# Patient Record
Sex: Female | Born: 1945 | Race: White | Hispanic: No | Smoking: Never smoker
Health system: Southern US, Community
[De-identification: ages and names within clinical notes are randomized; demographics above are authoritative.]

## PROBLEM LIST (undated history)

## (undated) DIAGNOSIS — C4490 Unspecified malignant neoplasm of skin, unspecified: Secondary | ICD-10-CM

## (undated) DIAGNOSIS — K219 Gastro-esophageal reflux disease without esophagitis: Secondary | ICD-10-CM

## (undated) DIAGNOSIS — T8859XA Other complications of anesthesia, initial encounter: Secondary | ICD-10-CM

## (undated) DIAGNOSIS — E7800 Pure hypercholesterolemia, unspecified: Secondary | ICD-10-CM

## (undated) DIAGNOSIS — Z8781 Personal history of (healed) traumatic fracture: Secondary | ICD-10-CM

## (undated) DIAGNOSIS — B019 Varicella without complication: Secondary | ICD-10-CM

## (undated) DIAGNOSIS — Z8719 Personal history of other diseases of the digestive system: Secondary | ICD-10-CM

## (undated) DIAGNOSIS — I10 Essential (primary) hypertension: Secondary | ICD-10-CM

## (undated) DIAGNOSIS — J189 Pneumonia, unspecified organism: Secondary | ICD-10-CM

## (undated) DIAGNOSIS — IMO0002 Reserved for concepts with insufficient information to code with codable children: Secondary | ICD-10-CM

## (undated) DIAGNOSIS — T4145XA Adverse effect of unspecified anesthetic, initial encounter: Secondary | ICD-10-CM

## (undated) DIAGNOSIS — K5730 Diverticulosis of large intestine without perforation or abscess without bleeding: Secondary | ICD-10-CM

## (undated) DIAGNOSIS — M810 Age-related osteoporosis without current pathological fracture: Secondary | ICD-10-CM

## (undated) DIAGNOSIS — M1990 Unspecified osteoarthritis, unspecified site: Secondary | ICD-10-CM

## (undated) DIAGNOSIS — K2270 Barrett's esophagus without dysplasia: Secondary | ICD-10-CM

## (undated) HISTORY — DX: Essential (primary) hypertension: I10

## (undated) HISTORY — DX: Gastro-esophageal reflux disease without esophagitis: K21.9

## (undated) HISTORY — DX: Diverticulosis of large intestine without perforation or abscess without bleeding: K57.30

## (undated) HISTORY — PX: SKIN CANCER EXCISION: SHX779

## (undated) HISTORY — DX: Unspecified malignant neoplasm of skin, unspecified: C44.90

## (undated) HISTORY — PX: ESOPHAGOGASTRODUODENOSCOPY: SHX1529

## (undated) HISTORY — PX: TUBAL LIGATION: SHX77

## (undated) HISTORY — DX: Barrett's esophagus without dysplasia: K22.70

## (undated) HISTORY — DX: Varicella without complication: B01.9

## (undated) HISTORY — DX: Pure hypercholesterolemia, unspecified: E78.00

## (undated) HISTORY — PX: JOINT REPLACEMENT: SHX530

## (undated) HISTORY — DX: Unspecified osteoarthritis, unspecified site: M19.90

## (undated) HISTORY — DX: Reserved for concepts with insufficient information to code with codable children: IMO0002

## (undated) HISTORY — PX: COLONOSCOPY: SHX174

---

## 1998-01-29 HISTORY — PX: BREAST SURGERY: SHX581

## 1998-01-29 HISTORY — PX: BREAST EXCISIONAL BIOPSY: SUR124

## 2004-04-25 ENCOUNTER — Ambulatory Visit: Payer: Self-pay

## 2004-05-03 ENCOUNTER — Ambulatory Visit: Payer: Self-pay | Admitting: Unknown Physician Specialty

## 2005-07-10 ENCOUNTER — Ambulatory Visit: Payer: Self-pay | Admitting: Internal Medicine

## 2006-02-03 LAB — HM COLONOSCOPY

## 2006-07-29 ENCOUNTER — Ambulatory Visit: Payer: Self-pay | Admitting: Internal Medicine

## 2007-09-25 ENCOUNTER — Ambulatory Visit: Payer: Self-pay | Admitting: Unknown Physician Specialty

## 2007-10-15 ENCOUNTER — Ambulatory Visit: Payer: Self-pay | Admitting: Internal Medicine

## 2009-01-26 ENCOUNTER — Ambulatory Visit: Payer: Self-pay | Admitting: Internal Medicine

## 2009-12-08 ENCOUNTER — Ambulatory Visit: Payer: Self-pay | Admitting: Unknown Physician Specialty

## 2009-12-09 LAB — PATHOLOGY REPORT

## 2010-03-01 ENCOUNTER — Ambulatory Visit: Payer: Self-pay | Admitting: Internal Medicine

## 2011-03-28 ENCOUNTER — Ambulatory Visit: Payer: Self-pay | Admitting: Unknown Physician Specialty

## 2011-10-05 ENCOUNTER — Ambulatory Visit: Payer: Self-pay | Admitting: Unknown Physician Specialty

## 2012-02-04 ENCOUNTER — Encounter: Payer: Self-pay | Admitting: Internal Medicine

## 2012-02-04 ENCOUNTER — Other Ambulatory Visit (HOSPITAL_COMMUNITY)
Admission: RE | Admit: 2012-02-04 | Discharge: 2012-02-04 | Disposition: A | Discharge status: Home / Self Care | Payer: Medicare PPO | Source: Ambulatory Visit | Attending: Internal Medicine | Admitting: Internal Medicine

## 2012-02-04 ENCOUNTER — Ambulatory Visit (INDEPENDENT_AMBULATORY_CARE_PROVIDER_SITE_OTHER): Payer: Medicare PPO | Admitting: Internal Medicine

## 2012-02-04 VITALS — BP 118/70 | HR 73 | Temp 98.3°F | Ht 63.5 in | Wt 202.0 lb

## 2012-02-04 DIAGNOSIS — Z139 Encounter for screening, unspecified: Secondary | ICD-10-CM

## 2012-02-04 DIAGNOSIS — IMO0002 Reserved for concepts with insufficient information to code with codable children: Secondary | ICD-10-CM

## 2012-02-04 DIAGNOSIS — Z124 Encounter for screening for malignant neoplasm of cervix: Secondary | ICD-10-CM | POA: Insufficient documentation

## 2012-02-04 DIAGNOSIS — E7800 Pure hypercholesterolemia, unspecified: Secondary | ICD-10-CM

## 2012-02-04 DIAGNOSIS — K219 Gastro-esophageal reflux disease without esophagitis: Secondary | ICD-10-CM

## 2012-02-04 DIAGNOSIS — R6889 Other general symptoms and signs: Secondary | ICD-10-CM

## 2012-02-04 DIAGNOSIS — B977 Papillomavirus as the cause of diseases classified elsewhere: Secondary | ICD-10-CM

## 2012-02-04 DIAGNOSIS — Z1151 Encounter for screening for human papillomavirus (HPV): Secondary | ICD-10-CM | POA: Insufficient documentation

## 2012-02-04 DIAGNOSIS — I10 Essential (primary) hypertension: Secondary | ICD-10-CM

## 2012-02-04 DIAGNOSIS — R87810 Cervical high risk human papillomavirus (HPV) DNA test positive: Secondary | ICD-10-CM | POA: Insufficient documentation

## 2012-02-04 DIAGNOSIS — K2270 Barrett's esophagus without dysplasia: Secondary | ICD-10-CM

## 2012-02-04 DIAGNOSIS — R5381 Other malaise: Secondary | ICD-10-CM

## 2012-02-04 DIAGNOSIS — Z8619 Personal history of other infectious and parasitic diseases: Secondary | ICD-10-CM | POA: Insufficient documentation

## 2012-02-04 DIAGNOSIS — R5383 Other fatigue: Secondary | ICD-10-CM

## 2012-02-04 DIAGNOSIS — C4490 Unspecified malignant neoplasm of skin, unspecified: Secondary | ICD-10-CM

## 2012-02-05 ENCOUNTER — Other Ambulatory Visit (INDEPENDENT_AMBULATORY_CARE_PROVIDER_SITE_OTHER): Payer: Medicare PPO

## 2012-02-05 DIAGNOSIS — R5383 Other fatigue: Secondary | ICD-10-CM

## 2012-02-05 DIAGNOSIS — R5381 Other malaise: Secondary | ICD-10-CM

## 2012-02-05 DIAGNOSIS — E7800 Pure hypercholesterolemia, unspecified: Secondary | ICD-10-CM

## 2012-02-05 DIAGNOSIS — K2270 Barrett's esophagus without dysplasia: Secondary | ICD-10-CM

## 2012-02-05 DIAGNOSIS — I10 Essential (primary) hypertension: Secondary | ICD-10-CM

## 2012-02-05 LAB — TSH: TSH: 3.39 u[IU]/mL (ref 0.35–5.50)

## 2012-02-05 LAB — BASIC METABOLIC PANEL
CO2: 24 mEq/L (ref 19–32)
Calcium: 9.2 mg/dL (ref 8.4–10.5)
Chloride: 107 mEq/L (ref 96–112)
Glucose, Bld: 103 mg/dL — ABNORMAL HIGH (ref 70–99)
Sodium: 138 mEq/L (ref 135–145)

## 2012-02-05 LAB — HEPATIC FUNCTION PANEL
ALT: 24 U/L (ref 0–35)
Albumin: 3.9 g/dL (ref 3.5–5.2)
Bilirubin, Direct: 0.1 mg/dL (ref 0.0–0.3)
Total Protein: 6.9 g/dL (ref 6.0–8.3)

## 2012-02-05 LAB — LDL CHOLESTEROL, DIRECT: Direct LDL: 182.4 mg/dL

## 2012-02-05 LAB — CBC WITH DIFFERENTIAL/PLATELET
Basophils Relative: 0.3 % (ref 0.0–3.0)
Eosinophils Relative: 2.3 % (ref 0.0–5.0)
HCT: 41.8 % (ref 36.0–46.0)
Hemoglobin: 14.4 g/dL (ref 12.0–15.0)
Lymphs Abs: 1.6 10*3/uL (ref 0.7–4.0)
MCV: 89.2 fl (ref 78.0–100.0)
Monocytes Absolute: 0.3 10*3/uL (ref 0.1–1.0)
Neutro Abs: 3 10*3/uL (ref 1.4–7.7)
RBC: 4.68 Mil/uL (ref 3.87–5.11)
WBC: 5 10*3/uL (ref 4.5–10.5)

## 2012-02-05 LAB — LIPID PANEL
HDL: 39.6 mg/dL (ref >39.00)
Total CHOL/HDL Ratio: 7

## 2012-02-06 ENCOUNTER — Telehealth: Payer: Self-pay | Admitting: Internal Medicine

## 2012-02-06 DIAGNOSIS — E7800 Pure hypercholesterolemia, unspecified: Secondary | ICD-10-CM

## 2012-02-06 MED ORDER — EZETIMIBE-SIMVASTATIN 10-20 MG PO TABS: 1.0000 | ORAL_TABLET | Freq: Every day | ORAL | Status: DC

## 2012-02-06 NOTE — Telephone Encounter (Signed)
Appointment 2/24 pt aware of appointment

## 2012-02-06 NOTE — Telephone Encounter (Signed)
Pt notified of labs via my chart messaging.  She was started on cholesterol medicine.  Needs a follow up lab in six weeks. This is non fasting.  Please call and schedule her an appt time to return for lab in 6 weeks.  Thanks.

## 2012-02-06 NOTE — Telephone Encounter (Signed)
Opened in error

## 2012-02-07 ENCOUNTER — Encounter: Payer: Self-pay | Admitting: Internal Medicine

## 2012-02-10 ENCOUNTER — Encounter: Payer: Self-pay | Admitting: Internal Medicine

## 2012-02-10 DIAGNOSIS — C4490 Unspecified malignant neoplasm of skin, unspecified: Secondary | ICD-10-CM | POA: Insufficient documentation

## 2012-02-10 NOTE — Assessment & Plan Note (Signed)
Blood pressure under good control.  Same medication regimen.  Check metabolic panel.    

## 2012-02-10 NOTE — Progress Notes (Signed)
  Subjective:    Patient ID: Kaitlin Dean, female    DOB: 12-Apr-1945, 67 y.o.   MRN: [id number]  HPI 67 year old female with past history of hypertension and hypercholesterolemia who comes in today to follow up on these issues as well as for a complete physical exam.  She states she is doing relatively well.  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  Has arthritis in her knees.  Saw Dr HBK. Xray and mri.  Has a torn meniscus.  Decreased cartilage.  Planning to see Dr Lequita Halt for another opinion.  Limiting her as far as her activities.  She stopped lipitor secondary to aching.  She previously tolerated vytorin.  Has been off cholesterol medicine now.    Past Medical History  Diagnosis Date  . Hypertension   . Hypercholesterolemia   . GERD (gastroesophageal reflux disease)   . 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 Skin cancer   . Barrett's esophagus   . Diverticulosis   . Chicken pox   . Arthritis   . Asthma     Current Outpatient Prescriptions on File Prior to Visit  Medication Sig Dispense Refill  . Calcium Carb-Cholecalciferol (CALCIUM 500 +D) 500-400 MG-UNIT TABS Take 1 tablet by mouth daily.       Marland Kitchen omeprazole (PRILOSEC) 20 MG capsule Take 20 mg by mouth daily.      Marland Kitchen telmisartan (MICARDIS) 40 MG tablet Take 40 mg by mouth daily.      Marland Kitchen ezetimibe-simvastatin (VYTORIN) 10-20 MG per tablet Take 1 tablet by mouth at bedtime.  30 tablet  3    Review of Systems Patient denies any headache, lightheadedness or dizziness.  No significant sinus or allergy symptoms.   No chest pain, tightness or palpitations.  No increased shortness of breath, cough or congestion.  No nausea or vomiting.  No abdominal pain or cramping.  No bowel change, such as diarrhea, constipation, BRBPR or melana.  No urine change.  Knee pain as outlined.      Objective:   Physical Exam Filed Vitals:   02/04/12 1401  BP: 118/70  Pulse: 73  Temp: 98.3 F (94.54 C)   67 year old  female in no acute distress.   HEENT:  Nares- clear.  Oropharynx - without lesions. NECK:  Supple.  Nontender.  No audible bruit.  HEART:  Appears to be regular. LUNGS:  No crackles or wheezing audible.  Respirations even and unlabored.  RADIAL PULSE:  Equal bilaterally.    BREASTS:  No nipple discharge or nipple retraction present.  Could not appreciate any distinct nodules or axillary adenopathy.  ABDOMEN:  Soft, nontender.  Bowel sounds present and normal.  No audible abdominal bruit.  GU:  Normal external genitalia.  Vaginal vault without lesions.  Cervix identified.  Pap performed. Could not appreciate any adnexal masses or tenderness.   RECTAL:  Heme negative.   EXTREMITIES:  No increased edema present.  DP pulses palpable and equal bilaterally.          Assessment & Plan:  MSK.  Knee pain - limiting her activity.  See above.  Planning to see Dr Lequita Halt.    INCREASED PSYCHOSOCIAL STRESSORS.  Doing well on Wellbutrin.  Follow.    HEALTH MAINTENANCE.  Physical today.  Pap today.  Schedule mammogram.  Colonoscopy 05/03/04 with diverticulosis and internal hemorrhoids.  Recommended follow up colonoscopy 04/2014.  IFOB - given today.

## 2012-02-10 NOTE — Assessment & Plan Note (Signed)
Last EGD 11/11.  Currently doing well.  Recommended follow up EGD 2014.

## 2012-02-10 NOTE — Assessment & Plan Note (Signed)
Previously evaluated at [facility].  Biopsy negative for dysplasia.  Repeat pap today.

## 2012-02-10 NOTE — Assessment & Plan Note (Signed)
Saw Dr Roseanne Kaufman.  Had basal cell removed.

## 2012-02-10 NOTE — Assessment & Plan Note (Signed)
Reflux symptoms controlled.  See above.

## 2012-02-10 NOTE — Assessment & Plan Note (Signed)
Off cholesterol medicine now.  Check lipid panel today.  Tolerated vytorin previously.  Had intolerance to Lipitor.

## 2012-03-01 ENCOUNTER — Encounter: Payer: Self-pay | Admitting: Internal Medicine

## 2012-03-24 ENCOUNTER — Other Ambulatory Visit (INDEPENDENT_AMBULATORY_CARE_PROVIDER_SITE_OTHER): Payer: Medicare PPO

## 2012-03-24 LAB — HEPATIC FUNCTION PANEL
Albumin: 4 g/dL (ref 3.5–5.2)
Alkaline Phosphatase: 78 U/L (ref 39–117)
Total Protein: 6.8 g/dL (ref 6.0–8.3)

## 2012-03-26 ENCOUNTER — Telehealth: Payer: Self-pay | Admitting: Internal Medicine

## 2012-03-26 NOTE — Telephone Encounter (Signed)
I received a form form Dr Lequita Halt for pre op clearance.  Please call and schedule pt for a pre op evaluation.  Will need 30 min.  Her surgery is planned for 04/30/12.  Thanks.

## 2012-03-27 NOTE — Telephone Encounter (Signed)
Appointment 04/07/12 @ 11 pt aware  Pt would like someone to call her with her lab results

## 2012-03-28 ENCOUNTER — Other Ambulatory Visit: Payer: Self-pay | Admitting: Orthopedic Surgery

## 2012-03-28 MED ORDER — DEXAMETHASONE SODIUM PHOSPHATE 10 MG/ML IJ SOLN: 10.0000 mg | Freq: Once | INTRAMUSCULAR | Status: DC

## 2012-03-28 MED ORDER — BUPIVACAINE LIPOSOME 1.3 % IJ SUSP: 20.0000 mL | Freq: Once | INTRAMUSCULAR | Status: DC

## 2012-03-28 NOTE — Progress Notes (Signed)
Preoperative surgical orders have been place into the Epic hospital system for [facility] on 03/28/2012, 2:07 PM  by Patrica Duel for surgery on 04/30/2012.  Preop Total Knee orders including Experal, IV Tylenol, and IV Decadron as long as there are no contraindications to the above medications. Avel Peace, PA-C

## 2012-03-28 NOTE — Telephone Encounter (Signed)
Patients does not have results at this moment.

## 2012-04-02 NOTE — Telephone Encounter (Signed)
Patient notified as instructed by telephone. 

## 2012-04-02 NOTE — Telephone Encounter (Signed)
Notify pt her liver panel is normal.  Also make sure pt is aware of pre op appt with me on 04/07/12

## 2012-04-02 NOTE — Telephone Encounter (Signed)
Spoke to patient and was advised that she is having problems with my chart and would like a call back with her lab results.

## 2012-04-07 ENCOUNTER — Encounter: Payer: Self-pay | Admitting: Internal Medicine

## 2012-04-07 ENCOUNTER — Ambulatory Visit (INDEPENDENT_AMBULATORY_CARE_PROVIDER_SITE_OTHER): Payer: Medicare PPO | Admitting: Internal Medicine

## 2012-04-07 VITALS — BP 120/80 | HR 74 | Temp 98.3°F | Ht 63.5 in | Wt 195.8 lb

## 2012-04-08 ENCOUNTER — Encounter: Payer: Self-pay | Admitting: Internal Medicine

## 2012-04-08 NOTE — Progress Notes (Signed)
Subjective:    Patient ID: Kaitlin Dean, female    DOB: Jun 05, 1945, 67 y.o.   MRN: [id number]  HPI 67 year old female with past history of hypertension and hypercholesterolemia who comes in today for a pre op evaluation.  She states she is doing well.  Planning to have knee surgery.  Needing a pre op evaluation.  She states she feels good.  Denies any chest pain or tightness.  No sob.  No nausea or vomiting.  No bowel change.  No increased acid reflux.  Trying to exercise more.  Adjusting her diet.    Past Medical History  Diagnosis Date  . Hypertension   . Hypercholesterolemia   . GERD (gastroesophageal reflux disease)   . 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 Skin cancer   . Barrett's esophagus   . Diverticulosis   . Chicken pox   . Arthritis   . Asthma     Current Outpatient Prescriptions on File Prior to Visit  Medication Sig Dispense Refill  . BuPROPion HCl (WELLBUTRIN SR PO) Take 100 mg by mouth daily.      . Calcium Carb-Cholecalciferol (CALCIUM 500 +D) 500-400 MG-UNIT TABS Take 1 tablet by mouth daily.       . cholecalciferol (VITAMIN D) 1000 UNITS tablet Take 1,000 Units by mouth daily.      Marland Kitchen ezetimibe-simvastatin (VYTORIN) 10-20 MG per tablet Take 1 tablet by mouth at bedtime.  30 tablet  3  . Multiple Vitamin (MULTIVITAMIN) tablet Take 1 tablet by mouth daily.      Marland Kitchen omeprazole (PRILOSEC) 20 MG capsule Take 20 mg by mouth daily.      Marland Kitchen telmisartan (MICARDIS) 40 MG tablet Take 40 mg by mouth daily.      . vitamin B-12 (CYANOCOBALAMIN) 100 MCG tablet Take 100 mcg by mouth daily.       No current facility-administered medications on file prior to visit.    Review of Systems Patient denies any headache, lightheadedness or dizziness.  No sinus or allergy symptoms.   No chest pain, tightness or palpitations.  No increased shortness of breath, cough or congestion.  No nausea or vomiting.  No acid reflux.  No abdominal pain or cramping.  No bowel change, such as  diarrhea, constipation, BRBPR or melana.  No urine change.  Planning for knee surgery as outlined.      Objective:   Physical Exam  Filed Vitals:   04/07/12 1058  BP: 120/80  Pulse: 74  Temp: 98.3 F (73.82 C)   67 year old female in no acute distress.   HEENT:  Nares- clear.  Oropharynx - without lesions. NECK:  Supple.  Nontender.  No audible bruit.  HEART:  Appears to be regular. LUNGS:  No crackles or wheezing audible.  Respirations even and unlabored.  RADIAL PULSE:  Equal bilaterally.  ABDOMEN:  Soft, nontender.  Bowel sounds present and normal.  No audible abdominal bruit.    EXTREMITIES:  No increased edema present.  DP pulses palpable and equal bilaterally.          Assessment & Plan:  MSK.  Knee pain.  Planning for knee surgery.  Seeing Dr Lequita Halt.    PRE OP EVALUATION.  EKG obtained and revealed SR with no acute ischemic changes.  She is currently asymptomatic.  I feel that she is at low risk from a cardiac standpoint to proceed with the planned knee surgery.  I do recommend close intra op and post op monitoring  of her heart rate and blood pressure to avoid extremes.    INCREASED PSYCHOSOCIAL STRESSORS.  Doing well on Wellbutrin.  Follow.    HEALTH MAINTENANCE.  Physical last visit. Colonoscopy 05/03/04 with diverticulosis and internal hemorrhoids.  Recommended follow up colonoscopy 04/2014.  Pap negative but HPV is positive.  Declines referral back to GYN.

## 2012-04-08 NOTE — Assessment & Plan Note (Signed)
Blood pressure under good control.  Same medication regimen.  I recommend close intra op and post op monitoring of her heart rate and blood pressure to avoid extremes.

## 2012-04-08 NOTE — Assessment & Plan Note (Signed)
Reflux symptoms controlled.  Would avoid antiinflammatories.   

## 2012-04-08 NOTE — Assessment & Plan Note (Signed)
Last EGD 11/11.  Currently doing well.  Recommended follow up EGD 2014.   

## 2012-04-15 ENCOUNTER — Encounter (HOSPITAL_COMMUNITY): Payer: Self-pay | Admitting: Pharmacy Technician

## 2012-04-21 NOTE — Patient Instructions (Signed)
Kaitlin Dean  04/21/2012   Your procedure is scheduled on: 04/30/12    Report to [facility] at    230pm  Call this number if you have problems the morning of surgery: [phone]   Remember:   Do not eat food after midnite.  May have clear liquids until 1030am then npo    Take these medicines the morning of surgery with A SIP OF WATER:    Do not wear jewelry, make-up or nail polish.  Do not wear lotions, powders, or perfumes.   Do not shave 48 hours prior to surgery.   Do not bring valuables to the hospital.  Contacts, dentures or bridgework may not be worn into surgery.  Leave suitcase in the car. After surgery it may be brought to your room.  For patients admitted to the hospital, checkout time is 11:00 AM the day of  discharge.   SEE CHG INSTRUCTION SHEET    Please read over the following fact sheets that you were given: MRSA Information, coughing and deep breathing exercises, leg exercises, Blood Transfusion Fact sheet, Incentive Spirometry Fact sheet                Failure to comply with these instructions may result in cancellation of your surgery.                Patient Signature ____________________________              Nurse Signature _____________________________

## 2012-04-22 ENCOUNTER — Encounter (HOSPITAL_COMMUNITY)
Admission: RE | Admit: 2012-04-22 | Discharge: 2012-04-22 | Disposition: A | Discharge status: Home / Self Care | Payer: Medicare PPO | Source: Ambulatory Visit | Attending: Orthopedic Surgery | Admitting: Orthopedic Surgery

## 2012-04-22 ENCOUNTER — Encounter (HOSPITAL_COMMUNITY): Payer: Self-pay

## 2012-04-22 ENCOUNTER — Ambulatory Visit (HOSPITAL_COMMUNITY)
Admission: RE | Admit: 2012-04-22 | Discharge: 2012-04-22 | Disposition: A | Discharge status: Home / Self Care | Payer: Medicare PPO | Source: Ambulatory Visit | Attending: Orthopedic Surgery | Admitting: Orthopedic Surgery

## 2012-04-22 DIAGNOSIS — Z01812 Encounter for preprocedural laboratory examination: Secondary | ICD-10-CM | POA: Insufficient documentation

## 2012-04-22 DIAGNOSIS — Z01818 Encounter for other preprocedural examination: Secondary | ICD-10-CM | POA: Insufficient documentation

## 2012-04-22 DIAGNOSIS — M47814 Spondylosis without myelopathy or radiculopathy, thoracic region: Secondary | ICD-10-CM | POA: Insufficient documentation

## 2012-04-22 DIAGNOSIS — J984 Other disorders of lung: Secondary | ICD-10-CM | POA: Insufficient documentation

## 2012-04-22 HISTORY — DX: Adverse effect of unspecified anesthetic, initial encounter: T41.45XA

## 2012-04-22 HISTORY — DX: Other complications of anesthesia, initial encounter: T88.59XA

## 2012-04-22 LAB — SURGICAL PCR SCREEN: Staphylococcus aureus: NEGATIVE

## 2012-04-22 LAB — COMPREHENSIVE METABOLIC PANEL
ALT: 30 U/L (ref 0–35)
AST: 26 U/L (ref 0–37)
Alkaline Phosphatase: 105 U/L (ref 39–117)
CO2: 25 mEq/L (ref 19–32)
Chloride: 103 mEq/L (ref 96–112)
Creatinine, Ser: 0.8 mg/dL (ref 0.50–1.10)
GFR calc non Af Amer: 74 mL/min — ABNORMAL LOW (ref >90)
Potassium: 3.8 mEq/L (ref 3.5–5.1)
Sodium: 137 mEq/L (ref 135–145)
Total Bilirubin: 0.5 mg/dL (ref 0.3–1.2)

## 2012-04-22 LAB — CBC
MCV: 91.8 fL (ref 78.0–100.0)
Platelets: 251 10*3/uL (ref 150–400)
RBC: 4.74 MIL/uL (ref 3.87–5.11)
WBC: 8.6 10*3/uL (ref 4.0–10.5)

## 2012-04-22 LAB — URINALYSIS, ROUTINE W REFLEX MICROSCOPIC
Bilirubin Urine: NEGATIVE
Glucose, UA: NEGATIVE mg/dL
Ketones, ur: NEGATIVE mg/dL
Specific Gravity, Urine: 1.011 (ref 1.005–1.030)
pH: 5.5 (ref 5.0–8.0)

## 2012-04-22 LAB — APTT: aPTT: 30 seconds (ref 24–37)

## 2012-04-22 LAB — URINE MICROSCOPIC-ADD ON

## 2012-04-22 NOTE — Progress Notes (Signed)
Urinalysis with micro results faxed via EPIC to Dr Aluisio.   

## 2012-04-27 NOTE — H&P (Signed)
TOTAL KNEE ADMISSION H&P  Patient is being admitted for left total knee arthroplasty.  Subjective:  Chief Complaint:left knee pain.  HPI: Kaitlin Dean, 67 y.o. female, has a history of pain and functional disability in the left knee due to arthritis and has failed non-surgical conservative treatments for greater than 12 weeks to includeNSAID's and/or analgesics, flexibility and strengthening excercises and activity modification.  Onset of symptoms was abrupt, starting 5 years ago with gradually worsening course since that time. The patient noted no past surgery on the left knee(s).  Patient currently rates pain in the left knee(s) at 7 out of 10 with activity. Patient has night pain, worsening of pain with activity and weight bearing, pain that interferes with activities of daily living, pain with passive range of motion, crepitus and joint swelling.  Patient has evidence of subchondral sclerosis, periarticular osteophytes and joint space narrowing by imaging studies. There is no active infection.  Patient Active Problem List   Diagnosis Date Noted  . Skin cancer 02/10/2012  . Hypertension 02/04/2012  . Hypercholesterolemia 02/04/2012  . GERD (gastroesophageal reflux disease) 02/04/2012  . HPV test positive 02/04/2012  . Barrett's esophagus 02/04/2012   Past Medical History  Diagnosis Date  . Hypertension   . Hypercholesterolemia   . GERD (gastroesophageal reflux disease)   . 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 Skin cancer   . Barrett's esophagus   . Chicken pox   . Arthritis   . Complication of anesthesia     woke up 15 years ago and felt like could not breathe - no complications     Past Surgical History  Procedure Laterality Date  . Tubal ligation    . Breast surgery  2000    Biopsy     Current outpatient prescriptions: buPROPion (WELLBUTRIN SR) 100 MG 12 hr tablet, Take 100 mg by mouth every morning., Disp: ,  Calcium Carb-Cholecalciferol (CALCIUM 500 +D)  500-400 MG-UNIT TABS, Take 1 tablet by mouth daily. , Disp: , Rfl: ;   cholecalciferol (VITAMIN D) 1000 UNITS tablet, Take 1,000 Units by mouth daily., Disp: , Rfl: ;  ezetimibe-simvastatin (VYTORIN) 10-20 MG per tablet, Take 1 tablet by mouth at bedtime., Disp: , Rfl:  Multiple Vitamin (MULTIVITAMIN) tablet, Take 1 tablet by mouth daily., Disp: , Rfl: ;   naproxen sodium (ANAPROX) 220 MG tablet, Take 440 mg by mouth 2 (two) times daily as needed (pain)., Disp: , Rfl: ;   omeprazole (PRILOSEC) 20 MG capsule, Take 20 mg by mouth daily., Disp: , Rfl: ;   telmisartan (MICARDIS) 40 MG tablet, Take 40 mg by mouth every morning. , Disp: , Rfl:  vitamin B-12 (CYANOCOBALAMIN) 100 MCG tablet, Take 100 mcg by mouth daily., Disp: , Rfl: ;  vitamin E 1000 UNIT capsule, Take 1,000 Units by mouth daily., Disp: , Rfl:   No Known Allergies  History  Substance Use Topics  . Smoking status: Never Smoker   . Smokeless tobacco: Never Used  . Alcohol Use: Yes     Comment: occasional    Family History  Problem Relation Age of Onset  . Hypertension Mother   . Congestive Heart Failure Mother   . Stroke Father   . Stroke Brother   . Heart failure Brother   . Sarcoidosis Sister     died of kidney failure  . Breast cancer Neg Hx   . Colon cancer Neg Hx      Review of Systems  Constitutional: Negative.   HENT:  Negative.  Negative for neck pain.   Eyes: Negative.   Respiratory: Negative.   Cardiovascular: Negative.   Gastrointestinal: Negative.   Genitourinary: Negative.   Musculoskeletal: Positive for joint pain. Negative for myalgias, back pain and falls.       Left knee pain  Skin: Negative.   Neurological: Negative.   Endo/Heme/Allergies: Negative.   Psychiatric/Behavioral: Negative.     Objective:  Physical Exam  Constitutional: She is oriented to person, place, and time. She appears well-developed and well-nourished. No distress.  HENT:  Head: Normocephalic and atraumatic.  Right Ear:  External ear normal.  Left Ear: External ear normal.  Nose: Nose normal.  Mouth/Throat: Oropharynx is clear and moist.  Eyes: Conjunctivae and EOM are normal.  Neck: Normal range of motion. Neck supple. No tracheal deviation present. No thyromegaly present.  Cardiovascular: Normal rate, regular rhythm, normal heart sounds and intact distal pulses.   No murmur heard. Respiratory: Effort normal and breath sounds normal. No respiratory distress. She has no wheezes. She exhibits no tenderness.  GI: Soft. Bowel sounds are normal. She exhibits no distension and no mass. There is no tenderness.  Musculoskeletal:       Right hip: Normal.       Left hip: Normal.       Right knee: Normal.       Left knee: She exhibits decreased range of motion and abnormal alignment. She exhibits no effusion and no erythema. Tenderness found. Medial joint line and lateral joint line tenderness noted.       Right lower leg: She exhibits no tenderness and no swelling.       Left lower leg: She exhibits no tenderness and no swelling.  Evaluation of her hips show normal range of motion, no discomfort. The right knee no effusion. Range 0 to 135 with no tenderness or instability. The left knee significant varus, range 5 to 120, marked crepitus on range of motion. Tenderness medial greater than lateral with no instability noted. Significant antalgic gait pattern on the left.  Lymphadenopathy:    She has no cervical adenopathy.  Neurological: She is alert and oriented to person, place, and time. She has normal strength and normal reflexes. No sensory deficit.  Skin: No rash noted. She is not diaphoretic. No erythema.  Psychiatric: She has a normal mood and affect. Her behavior is normal.   Vitals Pulse: 66 (Regular) Resp.: 18 (Unlabored) BP: 118/72 (Sitting, Left Arm, Standard)  Estimated body mass index is 35.22 kg/(m^2) as calculated from the following:   Height as of 02/04/12: 5' 3.5" (1.613 m).   Weight as of  02/04/12: 91.627 kg (202 lb).   Imaging Review Plain radiographs demonstrate severe degenerative joint disease of the left knee(s). The overall alignment issignificant varus. The bone quality appears to be fair for age and reported activity level.  Assessment/Plan:  End stage arthritis, left knee   The patient history, physical examination, clinical judgment of the provider and imaging studies are consistent with end stage degenerative joint disease of the left knee(s) and total knee arthroplasty is deemed medically necessary. The treatment options including medical management, injection therapy arthroscopy and arthroplasty were discussed at length. The risks and benefits of total knee arthroplasty were presented and reviewed. The risks due to aseptic loosening, infection, stiffness, patella tracking problems, thromboembolic complications and other imponderables were discussed. The patient acknowledged the explanation, agreed to proceed with the plan and consent was signed. Patient is being admitted for inpatient treatment for surgery, pain  control, PT, OT, prophylactic antibiotics, VTE prophylaxis, progressive ambulation and ADL's and discharge planning. The patient is planning to be discharged to skilled nursing facility ([facility])     [place], [place]

## 2012-04-30 ENCOUNTER — Inpatient Hospital Stay (HOSPITAL_COMMUNITY): Payer: Medicare PPO | Admitting: Registered Nurse

## 2012-04-30 ENCOUNTER — Encounter (HOSPITAL_COMMUNITY): Payer: Self-pay | Admitting: Registered Nurse

## 2012-04-30 ENCOUNTER — Encounter (HOSPITAL_COMMUNITY): Payer: Self-pay | Admitting: *Deleted

## 2012-04-30 ENCOUNTER — Inpatient Hospital Stay (HOSPITAL_COMMUNITY)
Admission: RE | Admit: 2012-04-30 | Discharge: 2012-05-03 | DRG: 470 | Disposition: A | Discharge status: Skilled Nursing Facility | Payer: Medicare PPO | Source: Ambulatory Visit | Attending: Orthopedic Surgery | Admitting: Orthopedic Surgery

## 2012-04-30 ENCOUNTER — Encounter (HOSPITAL_COMMUNITY)
Admission: RE | Disposition: A | Discharge status: Skilled Nursing Facility | Payer: Self-pay | Source: Ambulatory Visit | Attending: Orthopedic Surgery

## 2012-04-30 DIAGNOSIS — M1710 Unilateral primary osteoarthritis, unspecified knee: Principal | ICD-10-CM | POA: Diagnosis present

## 2012-04-30 DIAGNOSIS — I10 Essential (primary) hypertension: Secondary | ICD-10-CM | POA: Diagnosis present

## 2012-04-30 DIAGNOSIS — R42 Dizziness and giddiness: Secondary | ICD-10-CM | POA: Diagnosis not present

## 2012-04-30 DIAGNOSIS — M179 Osteoarthritis of knee, unspecified: Secondary | ICD-10-CM

## 2012-04-30 DIAGNOSIS — R110 Nausea: Secondary | ICD-10-CM | POA: Diagnosis not present

## 2012-04-30 DIAGNOSIS — E7800 Pure hypercholesterolemia, unspecified: Secondary | ICD-10-CM | POA: Diagnosis present

## 2012-04-30 DIAGNOSIS — K219 Gastro-esophageal reflux disease without esophagitis: Secondary | ICD-10-CM | POA: Diagnosis present

## 2012-04-30 DIAGNOSIS — Z96652 Presence of left artificial knee joint: Secondary | ICD-10-CM

## 2012-04-30 HISTORY — PX: TOTAL KNEE ARTHROPLASTY: SHX125

## 2012-04-30 LAB — TYPE AND SCREEN: Antibody Screen: NEGATIVE

## 2012-04-30 LAB — ABO/RH: ABO/RH(D): A NEG

## 2012-04-30 SURGERY — ARTHROPLASTY, KNEE, TOTAL
Anesthesia: Spinal | Site: Knee | Laterality: Left | Wound class: Clean

## 2012-04-30 MED ORDER — ONDANSETRON HCL 4 MG PO TABS: 4.0000 mg | ORAL_TABLET | Freq: Four times a day (QID) | ORAL | Status: DC | PRN

## 2012-04-30 MED ORDER — DIPHENHYDRAMINE HCL 12.5 MG/5ML PO ELIX
12.5000 mg | ORAL_SOLUTION | ORAL | Status: DC | PRN
  Administered 2012-05-02: 25 mg via ORAL
  Filled 2012-04-30: qty 10

## 2012-04-30 MED ORDER — MENTHOL 3 MG MT LOZG
1.0000 | LOZENGE | OROMUCOSAL | Status: DC | PRN
  Filled 2012-04-30: qty 9

## 2012-04-30 MED ORDER — FLEET ENEMA 7-19 GM/118ML RE ENEM: 1.0000 | ENEMA | Freq: Once | RECTAL | Status: AC | PRN

## 2012-04-30 MED ORDER — CHLORHEXIDINE GLUCONATE 4 % EX LIQD
60.0000 mL | Freq: Once | CUTANEOUS | Status: DC
  Filled 2012-04-30: qty 60

## 2012-04-30 MED ORDER — DEXAMETHASONE 6 MG PO TABS
10.0000 mg | ORAL_TABLET | Freq: Once | ORAL | Status: AC
  Administered 2012-05-01: 10 mg via ORAL
  Filled 2012-04-30: qty 1

## 2012-04-30 MED ORDER — TRAMADOL HCL 50 MG PO TABS
50.0000 mg | ORAL_TABLET | Freq: Four times a day (QID) | ORAL | Status: DC | PRN
  Administered 2012-05-02 (×2): 100 mg via ORAL
  Filled 2012-04-30 (×2): qty 2

## 2012-04-30 MED ORDER — FENTANYL CITRATE 0.05 MG/ML IJ SOLN
INTRAMUSCULAR | Status: DC | PRN
  Administered 2012-04-30 (×2): 50 ug via INTRAVENOUS

## 2012-04-30 MED ORDER — CEFAZOLIN SODIUM-DEXTROSE 2-3 GM-% IV SOLR
2.0000 g | INTRAVENOUS | Status: AC
  Administered 2012-04-30: 2 g via INTRAVENOUS

## 2012-04-30 MED ORDER — BISACODYL 10 MG RE SUPP: 10.0000 mg | Freq: Every day | RECTAL | Status: DC | PRN

## 2012-04-30 MED ORDER — SODIUM CHLORIDE 0.9 % IJ SOLN
INTRAMUSCULAR | Status: DC | PRN
  Administered 2012-04-30: 14:00:00

## 2012-04-30 MED ORDER — METHOCARBAMOL 500 MG PO TABS
500.0000 mg | ORAL_TABLET | Freq: Four times a day (QID) | ORAL | Status: DC | PRN
  Administered 2012-05-01 (×2): 500 mg via ORAL
  Filled 2012-04-30 (×3): qty 1

## 2012-04-30 MED ORDER — EZETIMIBE-SIMVASTATIN 10-20 MG PO TABS
1.0000 | ORAL_TABLET | Freq: Every day | ORAL | Status: DC
  Administered 2012-04-30 – 2012-05-02 (×3): 1 via ORAL
  Filled 2012-04-30 (×4): qty 1

## 2012-04-30 MED ORDER — DEXAMETHASONE SODIUM PHOSPHATE 10 MG/ML IJ SOLN: 10.0000 mg | Freq: Once | INTRAMUSCULAR | Status: AC

## 2012-04-30 MED ORDER — BUPIVACAINE LIPOSOME 1.3 % IJ SUSP
20.0000 mL | Freq: Once | INTRAMUSCULAR | Status: DC
  Filled 2012-04-30: qty 20

## 2012-04-30 MED ORDER — MEPERIDINE HCL 50 MG/ML IJ SOLN
6.2500 mg | INTRAMUSCULAR | Status: DC | PRN
  Administered 2012-04-30: 6.25 mg via INTRAVENOUS

## 2012-04-30 MED ORDER — METOCLOPRAMIDE HCL 5 MG/ML IJ SOLN: 5.0000 mg | Freq: Three times a day (TID) | INTRAMUSCULAR | Status: DC | PRN

## 2012-04-30 MED ORDER — LACTATED RINGERS IV SOLN
INTRAVENOUS | Status: DC
  Administered 2012-04-30 (×2): via INTRAVENOUS

## 2012-04-30 MED ORDER — ACETAMINOPHEN 325 MG PO TABS
650.0000 mg | ORAL_TABLET | Freq: Four times a day (QID) | ORAL | Status: DC | PRN
  Administered 2012-05-03: 650 mg via ORAL
  Filled 2012-04-30: qty 2

## 2012-04-30 MED ORDER — HYDROMORPHONE HCL PF 1 MG/ML IJ SOLN
INTRAMUSCULAR | Status: AC
  Filled 2012-04-30: qty 1

## 2012-04-30 MED ORDER — MORPHINE SULFATE 2 MG/ML IJ SOLN
1.0000 mg | INTRAMUSCULAR | Status: DC | PRN
  Administered 2012-04-30 – 2012-05-01 (×3): 2 mg via INTRAVENOUS
  Filled 2012-04-30 (×3): qty 1

## 2012-04-30 MED ORDER — STERILE WATER FOR IRRIGATION IR SOLN
Status: DC | PRN
  Administered 2012-04-30: 3000 mL

## 2012-04-30 MED ORDER — MIDAZOLAM HCL 5 MG/5ML IJ SOLN
INTRAMUSCULAR | Status: DC | PRN
  Administered 2012-04-30: 2 mg via INTRAVENOUS

## 2012-04-30 MED ORDER — RIVAROXABAN 10 MG PO TABS
10.0000 mg | ORAL_TABLET | Freq: Every day | ORAL | Status: DC
  Administered 2012-05-01 – 2012-05-03 (×3): 10 mg via ORAL
  Filled 2012-04-30 (×4): qty 1

## 2012-04-30 MED ORDER — BUPROPION HCL ER (SR) 100 MG PO TB12
100.0000 mg | ORAL_TABLET | Freq: Every morning | ORAL | Status: DC
  Administered 2012-05-01 – 2012-05-03 (×3): 100 mg via ORAL
  Filled 2012-04-30 (×3): qty 1

## 2012-04-30 MED ORDER — IRBESARTAN 150 MG PO TABS
150.0000 mg | ORAL_TABLET | Freq: Every day | ORAL | Status: DC
  Administered 2012-05-02: 150 mg via ORAL
  Filled 2012-04-30 (×3): qty 1

## 2012-04-30 MED ORDER — 0.9 % SODIUM CHLORIDE (POUR BTL) OPTIME
TOPICAL | Status: DC | PRN
  Administered 2012-04-30: 1000 mL

## 2012-04-30 MED ORDER — KCL IN DEXTROSE-NACL 20-5-0.9 MEQ/L-%-% IV SOLN
INTRAVENOUS | Status: DC
  Administered 2012-04-30 – 2012-05-01 (×3): via INTRAVENOUS
  Filled 2012-04-30 (×4): qty 1000

## 2012-04-30 MED ORDER — MEPERIDINE HCL 50 MG/ML IJ SOLN
INTRAMUSCULAR | Status: AC
  Filled 2012-04-30: qty 1

## 2012-04-30 MED ORDER — METHOCARBAMOL 500 MG PO TABS
ORAL_TABLET | ORAL | Status: AC
  Administered 2012-04-30: 500 mg via ORAL
  Filled 2012-04-30: qty 1

## 2012-04-30 MED ORDER — ACETAMINOPHEN 10 MG/ML IV SOLN
INTRAVENOUS | Status: DC | PRN
  Administered 2012-04-30: 1000 mg via INTRAVENOUS

## 2012-04-30 MED ORDER — METOCLOPRAMIDE HCL 10 MG PO TABS: 5.0000 mg | ORAL_TABLET | Freq: Three times a day (TID) | ORAL | Status: DC | PRN

## 2012-04-30 MED ORDER — PHENYLEPHRINE HCL 10 MG/ML IJ SOLN
INTRAMUSCULAR | Status: DC | PRN
Start: 2012-04-30 — End: 2012-04-30
  Administered 2012-04-30: 120 ug via INTRAVENOUS
  Administered 2012-04-30 (×2): 80 ug via INTRAVENOUS

## 2012-04-30 MED ORDER — PROPOFOL 10 MG/ML IV EMUL
INTRAVENOUS | Status: DC | PRN
  Administered 2012-04-30: 75 ug/kg/min via INTRAVENOUS

## 2012-04-30 MED ORDER — OXYCODONE HCL 5 MG PO TABS
5.0000 mg | ORAL_TABLET | ORAL | Status: DC | PRN
Start: 2012-04-30 — End: 2012-05-03
  Administered 2012-04-30 – 2012-05-01 (×6): 10 mg via ORAL
  Administered 2012-05-02 – 2012-05-03 (×4): 5 mg via ORAL
  Filled 2012-04-30: qty 1
  Filled 2012-04-30 (×2): qty 2
  Filled 2012-04-30: qty 1
  Filled 2012-04-30 (×2): qty 2
  Filled 2012-04-30 (×2): qty 1
  Filled 2012-04-30 (×2): qty 2

## 2012-04-30 MED ORDER — ACETAMINOPHEN 10 MG/ML IV SOLN
1000.0000 mg | Freq: Four times a day (QID) | INTRAVENOUS | Status: AC
  Administered 2012-04-30 – 2012-05-01 (×4): 1000 mg via INTRAVENOUS
  Filled 2012-04-30 (×7): qty 100

## 2012-04-30 MED ORDER — METHOCARBAMOL 100 MG/ML IJ SOLN: 500.0000 mg | Freq: Four times a day (QID) | INTRAVENOUS | Status: DC | PRN

## 2012-04-30 MED ORDER — ACETAMINOPHEN 10 MG/ML IV SOLN: 1000.0000 mg | Freq: Once | INTRAVENOUS | Status: DC

## 2012-04-30 MED ORDER — SODIUM CHLORIDE 0.9 % IV SOLN: INTRAVENOUS | Status: DC

## 2012-04-30 MED ORDER — HYDROMORPHONE HCL PF 1 MG/ML IJ SOLN
0.2500 mg | INTRAMUSCULAR | Status: DC | PRN
  Administered 2012-04-30 (×4): 0.5 mg via INTRAVENOUS

## 2012-04-30 MED ORDER — POLYETHYLENE GLYCOL 3350 17 G PO PACK: 17.0000 g | PACK | Freq: Every day | ORAL | Status: DC | PRN

## 2012-04-30 MED ORDER — PANTOPRAZOLE SODIUM 40 MG PO TBEC
40.0000 mg | DELAYED_RELEASE_TABLET | Freq: Every day | ORAL | Status: DC
  Filled 2012-04-30: qty 1

## 2012-04-30 MED ORDER — CEFAZOLIN SODIUM 1-5 GM-% IV SOLN
1.0000 g | Freq: Four times a day (QID) | INTRAVENOUS | Status: AC
  Administered 2012-04-30 – 2012-05-01 (×2): 1 g via INTRAVENOUS
  Filled 2012-04-30 (×2): qty 50

## 2012-04-30 MED ORDER — DOCUSATE SODIUM 100 MG PO CAPS
100.0000 mg | ORAL_CAPSULE | Freq: Two times a day (BID) | ORAL | Status: DC
  Administered 2012-04-30 – 2012-05-03 (×6): 100 mg via ORAL

## 2012-04-30 MED ORDER — LACTATED RINGERS IV SOLN
INTRAVENOUS | Status: DC
  Administered 2012-04-30: 16:00:00 via INTRAVENOUS

## 2012-04-30 MED ORDER — ACETAMINOPHEN 650 MG RE SUPP: 650.0000 mg | Freq: Four times a day (QID) | RECTAL | Status: DC | PRN

## 2012-04-30 MED ORDER — PHENOL 1.4 % MT LIQD
1.0000 | OROMUCOSAL | Status: DC | PRN
  Filled 2012-04-30: qty 177

## 2012-04-30 MED ORDER — PHENYLEPHRINE HCL 10 MG/ML IJ SOLN
10.0000 mg | INTRAVENOUS | Status: DC | PRN
  Administered 2012-04-30: 10 ug/min via INTRAVENOUS

## 2012-04-30 MED ORDER — ONDANSETRON HCL 4 MG/2ML IJ SOLN
4.0000 mg | Freq: Four times a day (QID) | INTRAMUSCULAR | Status: DC | PRN
  Administered 2012-04-30 – 2012-05-01 (×3): 4 mg via INTRAVENOUS
  Filled 2012-04-30 (×3): qty 2

## 2012-04-30 SURGICAL SUPPLY — 51 items
BAG ZIPLOCK 12X15 (MISCELLANEOUS) ×2 IMPLANT
BANDAGE ELASTIC 6 VELCRO ST LF (GAUZE/BANDAGES/DRESSINGS) ×2 IMPLANT
BANDAGE ESMARK 6X9 LF (GAUZE/BANDAGES/DRESSINGS) ×1 IMPLANT
BLADE SAG 18X100X1.27 (BLADE) ×2 IMPLANT
BLADE SAW SGTL 11.0X1.19X90.0M (BLADE) ×2 IMPLANT
BNDG ESMARK 6X9 LF (GAUZE/BANDAGES/DRESSINGS) ×2
BOWL SMART MIX CTS (DISPOSABLE) ×2 IMPLANT
CEMENT HV SMART SET (Cement) ×4 IMPLANT
CLOTH BEACON ORANGE TIMEOUT ST (SAFETY) ×2 IMPLANT
CUFF TOURN SGL QUICK 34 (TOURNIQUET CUFF) ×1
CUFF TRNQT CYL 34X4X40X1 (TOURNIQUET CUFF) ×1 IMPLANT
DRAPE EXTREMITY T 121X128X90 (DRAPE) ×2 IMPLANT
DRAPE POUCH INSTRU U-SHP 10X18 (DRAPES) ×2 IMPLANT
DRAPE U-SHAPE 47X51 STRL (DRAPES) ×2 IMPLANT
DRSG ADAPTIC 3X8 NADH LF (GAUZE/BANDAGES/DRESSINGS) ×2 IMPLANT
DURAPREP 26ML APPLICATOR (WOUND CARE) ×2 IMPLANT
ELECT REM PT RETURN 9FT ADLT (ELECTROSURGICAL) ×2
ELECTRODE REM PT RTRN 9FT ADLT (ELECTROSURGICAL) ×1 IMPLANT
EVACUATOR 1/8 PVC DRAIN (DRAIN) ×2 IMPLANT
FACESHIELD LNG OPTICON STERILE (SAFETY) ×10 IMPLANT
GLOVE BIO SURGEON STRL SZ7.5 (GLOVE) ×2 IMPLANT
GLOVE BIO SURGEON STRL SZ8 (GLOVE) ×2 IMPLANT
GLOVE BIOGEL PI IND STRL 8 (GLOVE) ×2 IMPLANT
GLOVE BIOGEL PI INDICATOR 8 (GLOVE) ×2
GLOVE SURG SS PI 6.5 STRL IVOR (GLOVE) ×4 IMPLANT
GOWN STRL NON-REIN LRG LVL3 (GOWN DISPOSABLE) ×4 IMPLANT
GOWN STRL REIN XL XLG (GOWN DISPOSABLE) ×2 IMPLANT
HANDPIECE INTERPULSE COAX TIP (DISPOSABLE) ×1
IMMOBILIZER KNEE 20 (SOFTGOODS) ×2
IMMOBILIZER KNEE 20 THIGH 36 (SOFTGOODS) ×1 IMPLANT
KIT BASIN OR (CUSTOM PROCEDURE TRAY) ×2 IMPLANT
MANIFOLD NEPTUNE II (INSTRUMENTS) ×2 IMPLANT
NEEDLE SPNL 20GX3.5 QUINCKE YW (NEEDLE) ×2 IMPLANT
NS IRRIG 1000ML POUR BTL (IV SOLUTION) ×2 IMPLANT
PACK TOTAL JOINT (CUSTOM PROCEDURE TRAY) ×2 IMPLANT
PAD ABD 7.5X8 STRL (GAUZE/BANDAGES/DRESSINGS) ×2 IMPLANT
PADDING CAST COTTON 6X4 STRL (CAST SUPPLIES) ×2 IMPLANT
POSITIONER SURGICAL ARM (MISCELLANEOUS) ×2 IMPLANT
SET HNDPC FAN SPRY TIP SCT (DISPOSABLE) ×1 IMPLANT
SPONGE GAUZE 4X4 12PLY (GAUZE/BANDAGES/DRESSINGS) ×2 IMPLANT
STRIP CLOSURE SKIN 1/2X4 (GAUZE/BANDAGES/DRESSINGS) ×2 IMPLANT
SUCTION FRAZIER 12FR DISP (SUCTIONS) ×2 IMPLANT
SUT MNCRL AB 4-0 PS2 18 (SUTURE) ×2 IMPLANT
SUT VIC AB 2-0 CT1 27 (SUTURE) ×3
SUT VIC AB 2-0 CT1 TAPERPNT 27 (SUTURE) ×3 IMPLANT
SUT VLOC 180 0 24IN GS25 (SUTURE) ×2 IMPLANT
SYR 50ML LL SCALE MARK (SYRINGE) ×2 IMPLANT
TOWEL OR 17X26 10 PK STRL BLUE (TOWEL DISPOSABLE) ×4 IMPLANT
TRAY FOLEY CATH 14FRSI W/METER (CATHETERS) ×2 IMPLANT
WATER STERILE IRR 1500ML POUR (IV SOLUTION) ×4 IMPLANT
WRAP KNEE MAXI GEL POST OP (GAUZE/BANDAGES/DRESSINGS) ×4 IMPLANT

## 2012-04-30 NOTE — Transfer of Care (Signed)
Immediate Anesthesia Transfer of Care Note  Patient: Kaitlin Dean  Procedure(s) Performed: Procedure(s) (LRB): TOTAL KNEE ARTHROPLASTY (Left)  Patient Location: PACU  Anesthesia Type: Spinal  Level of Consciousness: sedated, patient cooperative and responds to stimulaton  Airway & Oxygen Therapy: Patient Spontanous Breathing and Patient connected to face mask oxgen  Post-op Assessment: Report given to PACU RN and Post -op Vital signs reviewed and stable  Post vital signs: Reviewed and stable  Complications: No apparent anesthesia complications

## 2012-04-30 NOTE — Interval H&P Note (Signed)
History and Physical Interval Note:  04/30/2012 1:08 PM  Kaitlin Dean  has presented today for surgery, with the diagnosis of oa left knee   The various methods of treatment have been discussed with the patient and family. After consideration of risks, benefits and other options for treatment, the patient has consented to  Procedure(s): TOTAL KNEE ARTHROPLASTY (Left) as a surgical intervention .  The patient's history has been reviewed, patient examined, no change in status, stable for surgery.  I have reviewed the patient's chart and labs.  Questions were answered to the patient's satisfaction.     Loanne Drilling

## 2012-04-30 NOTE — Anesthesia Preprocedure Evaluation (Signed)
Anesthesia Evaluation  Patient identified by MRN, date of birth, ID band Patient awake    Reviewed: Allergy & Precautions, H&P , NPO status , Patient's Chart, lab work & pertinent test results  Airway Mallampati: II TM Distance: >3 FB Neck ROM: full    Dental no notable dental hx. (+) Teeth Intact and Dental Advisory Given   Pulmonary neg pulmonary ROS,  breath sounds clear to auscultation  Pulmonary exam normal       Cardiovascular hypertension, Pt. on medications Rhythm:regular Rate:Normal     Neuro/Psych negative neurological ROS  negative psych ROS   GI/Hepatic negative GI ROS, Neg liver ROS, GERD-  Medicated and Controlled,  Endo/Other  negative endocrine ROS  Renal/GU negative Renal ROS  negative genitourinary   Musculoskeletal   Abdominal   Peds  Hematology negative hematology ROS (+)   Anesthesia Other Findings   Reproductive/Obstetrics negative OB ROS                           Anesthesia Physical Anesthesia Plan  ASA: II  Anesthesia Plan: Spinal   Post-op Pain Management:    Induction:   Airway Management Planned:   Additional Equipment:   Intra-op Plan:   Post-operative Plan:   Informed Consent: I have reviewed the patients History and Physical, chart, labs and discussed the procedure including the risks, benefits and alternatives for the proposed anesthesia with the patient or authorized representative who has indicated his/her understanding and acceptance.   Dental Advisory Given  Plan Discussed with: CRNA and Surgeon  Anesthesia Plan Comments:         Anesthesia Quick Evaluation

## 2012-04-30 NOTE — Anesthesia Postprocedure Evaluation (Signed)
  Anesthesia Post-op Note  Patient: Kaitlin Dean  Procedure(s) Performed: Procedure(s) (LRB): TOTAL KNEE ARTHROPLASTY (Left)  Patient Location: PACU  Anesthesia Type: Spinal  Level of Consciousness: awake and alert   Airway and Oxygen Therapy: Patient Spontanous Breathing  Post-op Pain: mild  Post-op Assessment: Post-op Vital signs reviewed, Patient's Cardiovascular Status Stable, Respiratory Function Stable, Patent Airway and No signs of Nausea or vomiting  Last Vitals:  Filed Vitals:   04/30/12 1132  BP: 128/82  Pulse: 84  Temp: 36.7 C  Resp: 18    Post-op Vital Signs: stable   Complications: No apparent anesthesia complications

## 2012-04-30 NOTE — Op Note (Signed)
Pre-operative diagnosis- Osteoarthritis  Left knee(s)  Post-operative diagnosis- Osteoarthritis Left knee(s)  Procedure-  Left  Total Knee Arthroplasty  Surgeon- Gus Rankin. Josue Falconi, MD  Assistant- Avel Peace, PA-C   Anesthesia-  Spinal EBL-* No blood loss amount entered *  Drains Hemovac  Tourniquet time-  Total Tourniquet Time Documented: Thigh (Left) - 34 minutes Total: Thigh (Left) - 34 minutes    Complications- None  Condition-PACU - hemodynamically stable.   Brief Clinical Note  Kaitlin Dean is a 67 y.o. year old female with end stage OA of her left knee with progressively worsening pain and dysfunction. She has constant pain, with activity and at rest and significant functional deficits with difficulties even with ADLs. She has had extensive non-op management including analgesics, injections of cortisone and viscosupplements, and home exercise program, but remains in significant pain with significant dysfunction. Radiographs show bone on bone arthritis medial and patellofemoral. She presents now for left Total Knee Arthroplasty.     Procedure in detail---   The patient is brought into the operating room and positioned supine on the operating table. After successful administration of  Spinal,   a tourniquet is placed high on the  Left thigh(s) and the lower extremity is prepped and draped in the usual sterile fashion. Time out is performed by the operating team and then the  Left lower extremity is wrapped in Esmarch, knee flexed and the tourniquet inflated to 300 mmHg.       A midline incision is made with a ten blade through the subcutaneous tissue to the level of the extensor mechanism. A fresh blade is used to make a medial parapatellar arthrotomy. Soft tissue over the proximal medial tibia is subperiosteally elevated to the joint line with a knife and into the semimembranosus bursa with a Cobb elevator. Soft tissue over the proximal lateral tibia is elevated with attention  being paid to avoiding the patellar tendon on the tibial tubercle. The patella is everted, knee flexed 90 degrees and the ACL and PCL are removed. Findings are bone on bone medial and patellofemoral with large medial tibial defect and large osteophytes.        The drill is used to create a starting hole in the distal femur and the canal is thoroughly irrigated with sterile saline to remove the fatty contents. The 5 degree Left  valgus alignment guide is placed into the femoral canal and the distal femoral cutting block is pinned to remove 10 mm off the distal femur. Resection is made with an oscillating saw.      The tibia is subluxed forward and the menisci are removed. The extramedullary alignment guide is placed referencing proximally at the medial aspect of the tibial tubercle and distally along the second metatarsal axis and tibial crest. The block is pinned to remove 2mm off the more deficient medial  side. Resection is made with an oscillating saw. Size 3is the most appropriate size for the tibia and the proximal tibia is prepared with the modular drill and keel punch for that size.      The femoral sizing guide is placed and size 3 is most appropriate. Rotation is marked off the epicondylar axis and confirmed by creating a rectangular flexion gap at 90 degrees. The size 3 cutting block is pinned in this rotation and the anterior, posterior and chamfer cuts are made with the oscillating saw. The intercondylar block is then placed and that cut is made.      Trial size 3 tibial  component, trial size 3 posterior stabilized femur and a 15  mm posterior stabilized rotating platform insert trial is placed. Full extension is achieved with excellent varus/valgus and anterior/posterior balance throughout full range of motion. The patella is everted and thickness measured to be 25  mm. Free hand resection is taken to 15 mm, a 38 template is placed, lug holes are drilled, trial patella is placed, and it tracks  normally. Osteophytes are removed off the posterior femur with the trial in place. All trials are removed and the cut bone surfaces prepared with pulsatile lavage. Cement is mixed and once ready for implantation, the size 3 tibial implant, size  3 posterior stabilized femoral component, and the size 38 patella are cemented in place and the patella is held with the clamp. The trial insert is placed and the knee held in full extension. The Exparel (20 ml mixed with 50 ml saline) is injected into the extensor mechanism, posterior capsule, medial and lateral gutters and subcutaneous tissues.  All extruded cement is removed and once the cement is hard the permanent 15 mm posterior stabilized rotating platform insert is placed into the tibial tray.      The wound is copiously irrigated with saline solution and the extensor mechanism closed over a hemovac drain with #1 PDS suture. The tourniquet is released for a total tourniquet time of 34  minutes. Flexion against gravity is 140 degrees and the patella tracks normally. Subcutaneous tissue is closed with 2.0 vicryl and subcuticular with running 4.0 Monocryl. The incision is cleaned and dried and steri-strips and a bulky sterile dressing are applied. The limb is placed into a knee immobilizer and the patient is awakened and transported to recovery in stable condition.      Please note that a surgical assistant was a medical necessity for this procedure in order to perform it in a safe and expeditious manner. Surgical assistant was necessary to retract the ligaments and vital neurovascular structures to prevent injury to them and also necessary for proper positioning of the limb to allow for anatomic placement of the prosthesis.   [name], MD    04/30/2012, 2:26 PM

## 2012-04-30 NOTE — Anesthesia Procedure Notes (Signed)
Spinal  Start time: 04/30/2012 1:20 PM End time: 04/30/2012 1:26 PM Staffing CRNA/Resident: Nas Wafer E Preanesthetic Checklist Completed: patient identified, site marked, surgical consent, pre-op evaluation, IV checked, risks and benefits discussed and monitors and equipment checked Spinal Block Patient position: sitting Patient monitoring: continuous pulse ox and blood pressure Approach: midline Location: L3-4 Injection technique: single-shot Needle Needle gauge: 22 G Needle length: 9 cm Needle insertion depth: 3 cm Assessment Sensory level: T8 Additional Notes Pt tolerated procedure well. CSFx3, negative paresthesia.expiration 04/2013

## 2012-04-30 NOTE — Progress Notes (Signed)
Patient states she took Cipro for UTI x 3 days.  LD 04/25/12

## 2012-05-01 ENCOUNTER — Encounter (HOSPITAL_COMMUNITY): Payer: Self-pay | Admitting: Orthopedic Surgery

## 2012-05-01 LAB — BASIC METABOLIC PANEL
BUN: 11 mg/dL (ref 6–23)
CO2: 26 mEq/L (ref 19–32)
Chloride: 102 mEq/L (ref 96–112)
Creatinine, Ser: 0.85 mg/dL (ref 0.50–1.10)
Potassium: 4 mEq/L (ref 3.5–5.1)

## 2012-05-01 LAB — CBC
HCT: 33.8 % — ABNORMAL LOW (ref 36.0–46.0)
MCV: 90.4 fL (ref 78.0–100.0)
Platelets: 248 10*3/uL (ref 150–400)
RBC: 3.74 MIL/uL — ABNORMAL LOW (ref 3.87–5.11)
WBC: 7.9 10*3/uL (ref 4.0–10.5)

## 2012-05-01 MED ORDER — NON FORMULARY: 20.0000 mg | Freq: Every day | Status: DC

## 2012-05-01 MED ORDER — OMEPRAZOLE 20 MG PO CPDR
20.0000 mg | DELAYED_RELEASE_CAPSULE | Freq: Every day | ORAL | Status: DC
  Administered 2012-05-03: 20 mg via ORAL
  Filled 2012-05-01 (×3): qty 1

## 2012-05-01 NOTE — Progress Notes (Signed)
Physical Therapy Evaluation Patient Details Name: Kaitlin Dean MRN: [id number] DOB: [birth year] Today's Date: 05/01/2012 Time: 2536-6440 PT Time Calculation (min): 25 min  PT Assessment / Plan / Recommendation Clinical Impression  Pt s/p L TKR presents with decreased L LE strength/ROM, post op pain and c/o dizziness and nausea with attempts at mobility limiting functional mobility    PT Assessment  Patient needs continued PT services    Follow Up Recommendations  SNF    Does the patient have the potential to tolerate intense rehabilitation      Barriers to Discharge None      Equipment Recommendations  None recommended by PT    Recommendations for Other Services OT consult   Frequency 7X/week    Precautions / Restrictions Precautions Precautions: Fall Required Braces or Orthoses: Knee Immobilizer - Left Knee Immobilizer - Left: Discontinue once straight leg raise with < 10 degree lag Restrictions Weight Bearing Restrictions: No Other Position/Activity Restrictions: WBAT   Pertinent Vitals/Pain 5/10; premed, ice pack provided      Mobility  Bed Mobility Bed Mobility: Supine to Sit Supine to Sit: 3: Mod assist Details for Bed Mobility Assistance: cues for sequence and use of R LE to self assist Transfers Transfers: Not assessed (pt c/o dizziness, nausea and Feeling hot with move to sittin)    Exercises Total Joint Exercises Ankle Circles/Pumps: AROM;10 reps;Supine;Both Quad Sets: AROM;Both;10 reps;Supine Heel Slides: AAROM;Left;10 reps;Supine Straight Leg Raises: AAROM;Left;10 reps;Supine   PT Diagnosis: Difficulty walking  PT Problem List: Decreased strength;Decreased range of motion;Decreased activity tolerance;Decreased mobility;Decreased knowledge of use of DME;Decreased knowledge of precautions;Pain PT Treatment Interventions: DME instruction;Gait training;Stair training;Functional mobility training;Therapeutic activities;Therapeutic exercise;Patient/family  education   PT Goals Acute Rehab PT Goals PT Goal Formulation: With patient Time For Goal Achievement: 05/01/12 Potential to Achieve Goals: Good Pt will go Supine/Side to Sit: with supervision PT Goal: Supine/Side to Sit - Progress: Goal set today Pt will go Sit to Supine/Side: with supervision PT Goal: Sit to Supine/Side - Progress: Goal set today Pt will go Sit to Stand: with supervision PT Goal: Sit to Stand - Progress: Goal set today Pt will go Stand to Sit: with supervision PT Goal: Stand to Sit - Progress: Goal set today Pt will Ambulate: 51 - 150 feet;with supervision;with rolling walker PT Goal: Ambulate - Progress: Goal set today  Visit Information  Last PT Received On: 05/01/12 Assistance Needed: +2    Subjective Data  Subjective: I feel nauseaoous, dizzy and really hot - after moving to sitting Patient Stated Goal: rehab and home    Prior Functioning  Home Living Lives With: Alone Prior Function Level of Independence: Independent Able to Take Stairs?: Yes Driving: Yes Communication Communication: No difficulties Dominant Hand: Right    Cognition  Cognition Overall Cognitive Status: Appears within functional limits for tasks assessed/performed Arousal/Alertness: Awake/alert Orientation Level: Appears intact for tasks assessed Behavior During Session: [facility] for tasks performed    Extremity/Trunk Assessment Right Upper Extremity Assessment RUE ROM/Strength/Tone: [facility] for tasks assessed Left Upper Extremity Assessment LUE ROM/Strength/Tone: [facility] for tasks assessed Right Lower Extremity Assessment RLE ROM/Strength/Tone: [facility] for tasks assessed Left Lower Extremity Assessment LLE ROM/Strength/Tone: Deficits LLE ROM/Strength/Tone Deficits: Quads 2+/5 with AAROM at knee -10 - 60   Balance Balance Balance Assessed: Yes Static Sitting Balance Static Sitting - Balance Support: Bilateral upper extremity supported Static Sitting - Level of Assistance: 4: Min  assist;5: Stand by assistance Static Sitting - Comment/# of Minutes: 5  End of Session PT - End  of Session Equipment Utilized During Treatment: Left knee immobilizer Activity Tolerance: Patient limited by pain;Other (comment) (dizziness) Patient left: in bed;with call bell/phone within reach;with family/visitor present Nurse Communication: Mobility status;Other (comment) (pts c/o dizziness)  GP     Carley Strickling 05/01/2012, 1:02 PM

## 2012-05-01 NOTE — Progress Notes (Signed)
   Subjective: 1 Day Post-Op Procedure(s) (LRB): TOTAL KNEE ARTHROPLASTY (Left) Patient reports pain as mild.   Patient seen in rounds with Dr. Lequita Halt. Family in room at bedside.  Did okay last night.  She was able to get some sleep. Patient is well, and has had no acute complaints or problems We will start therapy today.  Plan is to go Skilled nursing facility after hospital stay.  Objective: Vital signs in last 24 hours: Temp:  [97.3 F (36.3 C)-99.6 F (37.6 C)] 99 F (37.2 C) (04/03 0534) Pulse Rate:  [50-84] 80 (04/03 0534) Resp:  [10-18] 16 (04/03 0534) BP: (98-155)/(59-103) 101/65 mmHg (04/03 0534) SpO2:  [99 %-100 %] 99 % (04/03 0534) FiO2 (%):  [100 %] 100 % (04/02 1645) Weight:  [88.905 kg (196 lb)] 88.905 kg (196 lb) (04/02 1645)  Intake/Output from previous day:  Intake/Output Summary (Last 24 hours) at 05/01/12 0950 Last data filed at 05/01/12 0900  Gross per 24 hour  Intake 4147.5 ml  Output   1445 ml  Net 2702.5 ml    Intake/Output this shift: Total I/O In: 360 [P.O.:360] Out: -   Labs:  Recent Labs  05/01/12 0355  HGB 11.5*    Recent Labs  05/01/12 0355  WBC 7.9  RBC 3.74*  HCT 33.8*  PLT 248    Recent Labs  05/01/12 0355  NA 134*  K 4.0  CL 102  CO2 26  BUN 11  CREATININE 0.85  GLUCOSE 151*  CALCIUM 8.4   No results found for this basename: LABPT, INR,  in the last 72 hours  EXAM General - Patient is Alert, Appropriate and Oriented Extremity - Neurovascular intact Sensation intact distally Dorsiflexion/Plantar flexion intact Dressing - dressing C/D/I Motor Function - intact, moving foot and toes well on exam.  Hemovac pulled without difficulty.  Past Medical History  Diagnosis Date  . Hypertension   . Hypercholesterolemia   . GERD (gastroesophageal reflux disease)   . 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 Skin cancer   . Barrett's esophagus   . Chicken pox   . Arthritis   . Complication of anesthesia       woke up 15 years ago and felt like could not breathe - no complications     Assessment/Plan: 1 Day Post-Op Procedure(s) (LRB): TOTAL KNEE ARTHROPLASTY (Left) Principal Problem:   OA (osteoarthritis) of knee  Estimated body mass index is 34.73 kg/(m^2) as calculated from the following:   Height as of this encounter: 5\' 3"  (1.6 m).   Weight as of this encounter: 88.905 kg (196 lb). Advance diet Up with therapy Discharge to SNF  DVT Prophylaxis - Xarelto Weight-Bearing as tolerated to left leg No vaccines. D/C O2 and Pulse OX and try on Room [address]  Patrica Duel 05/01/2012, 9:50 AM

## 2012-05-01 NOTE — Progress Notes (Signed)
Physical Therapy Treatment Patient Details Name: Kaitlin Dean MRN: [id number] DOB: 1945-08-09 Today's Date: 05/01/2012 Time: 1308-6578 PT Time Calculation (min): 18 min  PT Assessment / Plan / Recommendation Comments on Treatment Session       Follow Up Recommendations  SNF     Does the patient have the potential to tolerate intense rehabilitation     Barriers to Discharge None      Equipment Recommendations  None recommended by PT    Recommendations for Other Services OT consult  Frequency 7X/week   Plan Discharge plan remains appropriate    Precautions / Restrictions Precautions Precautions: Fall Required Braces or Orthoses: Knee Immobilizer - Left Knee Immobilizer - Left: Discontinue once straight leg raise with < 10 degree lag Restrictions Weight Bearing Restrictions: No Other Position/Activity Restrictions: WBAT   Pertinent Vitals/Pain Pt with pain 4/10; premed.  Pt c/o dizziness with mobility.  BP supine: 122/84 sit 122/83; stand 134/ 88 RN aware    Mobility  Bed Mobility Bed Mobility: Supine to Sit Supine to Sit: 4: Min assist;3: Mod assist Details for Bed Mobility Assistance: cues for sequence and use of R LE to self assist Transfers Transfers: Sit to Stand;Stand to Sit Sit to Stand: 1: +2 Total assist Sit to Stand: Patient Percentage: 60% Stand to Sit: 1: +2 Total assist Stand to Sit: Patient Percentage: 60% Details for Transfer Assistance: cues for LE management and use of UEs to self assist Ambulation/Gait Ambulation/Gait Assistance: 1: +2 Total assist Ambulation/Gait: Patient Percentage: 70% Ambulation Distance (Feet): 3 Feet Assistive device: Rolling walker Ambulation/Gait Assistance Details: cues for posture, position from RW and sequence Gait Pattern: Step-to pattern;Decreased step length - right;Decreased step length - left General Gait Details: Ltd by increasing dizziness    Exercises Total Joint Exercises Ankle Circles/Pumps: AROM;10  reps;Supine;Both Quad Sets: AROM;Both;10 reps;Supine Heel Slides: AAROM;Left;10 reps;Supine Straight Leg Raises: AAROM;Left;10 reps;Supine   PT Diagnosis: Difficulty walking  PT Problem List: Decreased strength;Decreased range of motion;Decreased activity tolerance;Decreased mobility;Decreased knowledge of use of DME;Decreased knowledge of precautions;Pain PT Treatment Interventions: DME instruction;Gait training;Stair training;Functional mobility training;Therapeutic activities;Therapeutic exercise;Patient/family education   PT Goals Acute Rehab PT Goals PT Goal Formulation: With patient Time For Goal Achievement: 05/01/12 Potential to Achieve Goals: Good Pt will go Supine/Side to Sit: with supervision PT Goal: Supine/Side to Sit - Progress: Goal set today Pt will go Sit to Supine/Side: with supervision PT Goal: Sit to Supine/Side - Progress: Goal set today Pt will go Sit to Stand: with supervision PT Goal: Sit to Stand - Progress: Goal set today Pt will go Stand to Sit: with supervision PT Goal: Stand to Sit - Progress: Goal set today Pt will Ambulate: 51 - 150 feet;with supervision;with rolling walker PT Goal: Ambulate - Progress: Goal set today  Visit Information  Last PT Received On: 05/01/12 Assistance Needed: +2    Subjective Data  Subjective: I feel dizzy again but not as bad as this morning.  I think it might be the muslce relaxor Patient Stated Goal: rehab and home    Cognition  Cognition Overall Cognitive Status: Appears within functional limits for tasks assessed/performed Arousal/Alertness: Awake/alert Orientation Level: Appears intact for tasks assessed Behavior During Session: [facility] for tasks performed    Balance  Balance Balance Assessed: Yes Static Sitting Balance Static Sitting - Balance Support: Bilateral upper extremity supported Static Sitting - Level of Assistance: 4: Min assist;5: Stand by assistance Static Sitting - Comment/# of Minutes: 5  End of  Session PT - End of  Session Equipment Utilized During Treatment: Left knee immobilizer Activity Tolerance: Patient limited by pain;Other (comment) Patient left: in bed;with call bell/phone within reach;with family/visitor present Nurse Communication: Mobility status;Other (comment)   GP     Jama Mcmiller 05/01/2012, 1:07 PM

## 2012-05-01 NOTE — Progress Notes (Signed)
Physical Therapy Treatment Patient Details Name: Kaitlin Dean MRN: [id number] DOB: March 06, 1945 Today's Date: 05/01/2012 Time: 4098-1191 PT Time Calculation (min): 12 min  PT Assessment / Plan / Recommendation Comments on Treatment Session       Follow Up Recommendations  SNF     Does the patient have the potential to tolerate intense rehabilitation     Barriers to Discharge        Equipment Recommendations  None recommended by PT    Recommendations for Other Services OT consult  Frequency 7X/week   Plan Discharge plan remains appropriate    Precautions / Restrictions Precautions Precautions: Fall Required Braces or Orthoses: Knee Immobilizer - Left Knee Immobilizer - Left: Discontinue once straight leg raise with < 10 degree lag Restrictions Weight Bearing Restrictions: No Other Position/Activity Restrictions: WBAT   Pertinent Vitals/Pain     Mobility  Bed Mobility Bed Mobility: Sit to Supine Supine to Sit: 4: Min assist;3: Mod assist Sit to Supine: 4: Min assist;3: Mod assist Details for Bed Mobility Assistance: cues for sequence and use of R LE to self assist Transfers Transfers: Sit to Stand;Stand to Sit Sit to Stand: 4: Min assist;3: Mod assist Sit to Stand: Patient Percentage: 60% Stand to Sit: 4: Min assist;3: Mod assist Stand to Sit: Patient Percentage: 60% Details for Transfer Assistance: cues for LE management and use of UEs to self assist Ambulation/Gait Ambulation/Gait Assistance: 1: +2 Total assist Ambulation/Gait: Patient Percentage: 70% Ambulation Distance (Feet): 3 Feet Assistive device: Rolling walker Ambulation/Gait Assistance Details: cues for posture, sequence and position from RW Gait Pattern: Step-to pattern;Decreased step length - right;Decreased step length - left General Gait Details: Ltd by increasing dizziness    Exercises     PT Diagnosis:    PT Problem List:   PT Treatment Interventions:     PT Goals Acute Rehab PT Goals PT  Goal Formulation: With patient Time For Goal Achievement: 05/01/12 Potential to Achieve Goals: Good Pt will go Supine/Side to Sit: with supervision PT Goal: Supine/Side to Sit - Progress: Goal set today Pt will go Sit to Supine/Side: with supervision PT Goal: Sit to Supine/Side - Progress: Goal set today Pt will go Sit to Stand: with supervision PT Goal: Sit to Stand - Progress: Goal set today Pt will go Stand to Sit: with supervision PT Goal: Stand to Sit - Progress: Goal set today Pt will Ambulate: 51 - 150 feet;with supervision;with rolling walker PT Goal: Ambulate - Progress: Goal set today  Visit Information  Last PT Received On: 05/01/12 Assistance Needed: +2 (+2 for saftey 2* ongoing complaints of dizziness with mobili)    Subjective Data  Subjective: I feel dizzy and just want to go to bed - spoken as soon as legs were lowered to floor from recliner Patient Stated Goal: rehab and home    Cognition  Cognition Overall Cognitive Status: Appears within functional limits for tasks assessed/performed Arousal/Alertness: Awake/alert Orientation Level: Appears intact for tasks assessed Behavior During Session: [facility] for tasks performed    Balance     End of Session PT - End of Session Equipment Utilized During Treatment: Left knee immobilizer Activity Tolerance: Patient limited by pain;Other (comment) Patient left: in bed;with call bell/phone within reach;with family/visitor present Nurse Communication: Mobility status;Other (comment)   GP     Tishina Lown 05/01/2012, 3:44 PM

## 2012-05-01 NOTE — Progress Notes (Signed)
CSW assisting with d/c planning. Pt has chosen [facility] [employer] [facility]. [employer] medicare authorization process has been initiated. cSW will continue to follow to assist with d/c planning to SNF.  Cori Razor LCSW [phone]

## 2012-05-01 NOTE — Progress Notes (Signed)
Clinical Social Work Department CLINICAL SOCIAL WORK PLACEMENT NOTE 05/01/2012  Patient:  Ledvina,Simra  Account Number:  [id number] Admit date:  04/30/2012  Clinical Social Worker:  Cori Razor, LCSW  Date/time:  05/01/2012 01:27 PM  Clinical Social Work is seeking post-discharge placement for this patient at the following level of care:   SKILLED NURSING   (*CSW will update this form in Epic as items are completed)   05/01/2012  Patient/family provided with [facility] Department of Clinical Social Work's list of facilities offering this level of care within the geographic area requested by the patient (or if unable, by the patient's family).  05/01/2012  Patient/family informed of their freedom to choose among providers that offer the needed level of care, that participate in Medicare, Medicaid or managed care program needed by the patient, have an available bed and are willing to accept the patient.    Patient/family informed of [facility]' ownership interest in [facility], as well as of the fact that they are under no obligation to receive care at this facility.  PASARR submitted to EDS on 05/01/2012 PASARR number received from EDS on 05/01/2012  FL2 transmitted to all facilities in geographic area requested by pt/family on  05/01/2012 FL2 transmitted to all facilities within larger geographic area on   Patient informed that his/her managed care company has contracts with or will negotiate with  certain facilities, including the following:     Patient/family informed of bed offers received:   Patient chooses bed at  Physician recommends and patient chooses bed at    Patient to be transferred to  on   Patient to be transferred to facility by   The following physician request were entered in Epic:   Additional Comments:  Cori Razor LCSW [phone]

## 2012-05-01 NOTE — Progress Notes (Signed)
Will leave foley in place overnight, per PA, due to patient nausea and strict I&O monitoring. Kaitlin Dean [place], [place] 05/01/2012 3:21 PM

## 2012-05-01 NOTE — Progress Notes (Signed)
Utilization review completed.  

## 2012-05-02 LAB — CBC
HCT: 34.9 % — ABNORMAL LOW (ref 36.0–46.0)
Hemoglobin: 12 g/dL (ref 12.0–15.0)
MCV: 90.4 fL (ref 78.0–100.0)
RDW: 12.6 % (ref 11.5–15.5)
WBC: 13.5 10*3/uL — ABNORMAL HIGH (ref 4.0–10.5)

## 2012-05-02 LAB — BASIC METABOLIC PANEL
BUN: 7 mg/dL (ref 6–23)
Chloride: 102 mEq/L (ref 96–112)
Creatinine, Ser: 0.7 mg/dL (ref 0.50–1.10)
GFR calc Af Amer: >90 mL/min (ref >90)
Glucose, Bld: 189 mg/dL — ABNORMAL HIGH (ref 70–99)
Potassium: 4 mEq/L (ref 3.5–5.1)

## 2012-05-02 MED ORDER — DSS 100 MG PO CAPS: 100.0000 mg | ORAL_CAPSULE | Freq: Two times a day (BID) | ORAL | Status: DC

## 2012-05-02 MED ORDER — METHOCARBAMOL 500 MG PO TABS: 500.0000 mg | ORAL_TABLET | Freq: Four times a day (QID) | ORAL | Status: DC | PRN

## 2012-05-02 MED ORDER — POLYETHYLENE GLYCOL 3350 17 G PO PACK: 17.0000 g | PACK | Freq: Every day | ORAL | Status: DC | PRN

## 2012-05-02 MED ORDER — ONDANSETRON HCL 4 MG PO TABS: 4.0000 mg | ORAL_TABLET | Freq: Four times a day (QID) | ORAL | Status: DC | PRN

## 2012-05-02 MED ORDER — BISACODYL 10 MG RE SUPP: 10.0000 mg | Freq: Every day | RECTAL | Status: DC | PRN

## 2012-05-02 MED ORDER — RIVAROXABAN 10 MG PO TABS: 10.0000 mg | ORAL_TABLET | Freq: Every day | ORAL | Status: DC

## 2012-05-02 MED ORDER — ACETAMINOPHEN 325 MG PO TABS: 650.0000 mg | ORAL_TABLET | Freq: Four times a day (QID) | ORAL | Status: DC | PRN

## 2012-05-02 MED ORDER — DIPHENHYDRAMINE HCL 12.5 MG/5ML PO ELIX: 12.5000 mg | ORAL_SOLUTION | ORAL | Status: DC | PRN

## 2012-05-02 MED ORDER — TRAMADOL HCL 50 MG PO TABS: 50.0000 mg | ORAL_TABLET | Freq: Four times a day (QID) | ORAL | Status: DC | PRN

## 2012-05-02 MED ORDER — OXYCODONE HCL 5 MG PO TABS: 5.0000 mg | ORAL_TABLET | ORAL | Status: DC | PRN

## 2012-05-02 NOTE — Discharge Summary (Signed)
Physician Discharge Summary   Patient ID: Kaitlin Dean MRN: [id number] DOB/AGE: October 15, 1945 67 y.o.  Admit date: 04/30/2012 Discharge date: Tentative Date of Discharge - 05/03/2012  Primary Diagnosis:  Osteoarthritis Left knee  Admission Diagnoses:  Past Medical History  Diagnosis Date  . Hypertension   . Hypercholesterolemia   . GERD (gastroesophageal reflux disease)   . 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 Skin cancer   . Barrett's esophagus   . Chicken pox   . Arthritis   . Complication of anesthesia     woke up 15 years ago and felt like could not breathe - no complications    Discharge Diagnoses:   Principal Problem:   OA (osteoarthritis) of knee  Estimated body mass index is 34.73 kg/(m^2) as calculated from the following:   Height as of this encounter: 5\' 3"  (1.6 m).   Weight as of this encounter: 88.905 kg (196 lb).  Procedure:  Procedure(s) (LRB): TOTAL KNEE ARTHROPLASTY (Left)   Consults: None  HPI: Kaitlin Dean is a 67 y.o. year old female with end stage OA of her left knee with progressively worsening pain and dysfunction. She has constant pain, with activity and at rest and significant functional deficits with difficulties even with ADLs. She has had extensive non-op management including analgesics, injections of cortisone and viscosupplements, and home exercise program, but remains in significant pain with significant dysfunction. Radiographs show bone on bone arthritis medial and patellofemoral. She presents now for left Total Knee Arthroplasty.   Laboratory Data: Admission on 04/30/2012  Component Date Value Range Status  . ABO/RH(D) 04/30/2012 A NEG   Final  . Antibody Screen 04/30/2012 NEG   Final  . Sample Expiration 04/30/2012 05/03/2012   Final  . ABO/RH(D) 04/30/2012 A NEG   Final  . WBC 05/01/2012 7.9  4.0 - 10.5 K/uL Final  . RBC 05/01/2012 3.74* 3.87 - 5.11 MIL/uL Final  . Hemoglobin 05/01/2012 11.5* 12.0 - 15.0 g/dL Final  . HCT  91/47/8295 33.8* 36.0 - 46.0 % Final  . MCV 05/01/2012 90.4  78.0 - 100.0 fL Final  . MCH 05/01/2012 30.7  26.0 - 34.0 pg Final  . MCHC 05/01/2012 34.0  30.0 - 36.0 g/dL Final  . RDW 62/13/0865 12.8  11.5 - 15.5 % Final  . Platelets 05/01/2012 248  150 - 400 K/uL Final  . Sodium 05/01/2012 134* 135 - 145 mEq/L Final  . Potassium 05/01/2012 4.0  3.5 - 5.1 mEq/L Final  . Chloride 05/01/2012 102  96 - 112 mEq/L Final  . CO2 05/01/2012 26  19 - 32 mEq/L Final  . Glucose, Bld 05/01/2012 151* 70 - 99 mg/dL Final  . BUN 78/46/9629 11  6 - 23 mg/dL Final  . Creatinine, Ser 05/01/2012 0.85  0.50 - 1.10 mg/dL Final  . Calcium 52/84/1324 8.4  8.4 - 10.5 mg/dL Final  . GFR calc non Af Amer 05/01/2012 68* >90 mL/min Final  . GFR calc Af Amer 05/01/2012 79* >90 mL/min Final   Comment:                                 The eGFR has been calculated                          using the CKD EPI equation.  This calculation has not been                          validated in all clinical                          situations.                          eGFR's persistently                          <90 mL/min signify                          possible Chronic Kidney Disease.  . WBC 05/02/2012 13.5* 4.0 - 10.5 K/uL Final  . RBC 05/02/2012 3.86* 3.87 - 5.11 MIL/uL Final  . Hemoglobin 05/02/2012 12.0  12.0 - 15.0 g/dL Final  . HCT 16/10/9602 34.9* 36.0 - 46.0 % Final  . MCV 05/02/2012 90.4  78.0 - 100.0 fL Final  . MCH 05/02/2012 31.1  26.0 - 34.0 pg Final  . MCHC 05/02/2012 34.4  30.0 - 36.0 g/dL Final  . RDW 54/09/8117 12.6  11.5 - 15.5 % Final  . Platelets 05/02/2012 265  150 - 400 K/uL Final  . Sodium 05/02/2012 135  135 - 145 mEq/L Final  . Potassium 05/02/2012 4.0  3.5 - 5.1 mEq/L Final  . Chloride 05/02/2012 102  96 - 112 mEq/L Final  . CO2 05/02/2012 24  19 - 32 mEq/L Final  . Glucose, Bld 05/02/2012 189* 70 - 99 mg/dL Final  . BUN 14/78/2956 7  6 - 23 mg/dL Final  . Creatinine,  Ser 05/02/2012 0.70  0.50 - 1.10 mg/dL Final  . Calcium 21/30/8657 9.4  8.4 - 10.5 mg/dL Final  . GFR calc non Af Amer 05/02/2012 86* >90 mL/min Final  . GFR calc Af Amer 05/02/2012 >90  >90 mL/min Final   Comment:                                 The eGFR has been calculated                          using the CKD EPI equation.                          This calculation has not been                          validated in all clinical                          situations.                          eGFR's persistently                          <90 mL/min signify                          possible Chronic Kidney Disease.  Hospital Outpatient Visit on 04/22/2012  Component Date Value Range Status  . aPTT 04/22/2012 30  24 - 37 seconds Final  . WBC 04/22/2012 8.6  4.0 - 10.5 K/uL Final  . RBC 04/22/2012 4.74  3.87 - 5.11 MIL/uL Final  . Hemoglobin 04/22/2012 14.3  12.0 - 15.0 g/dL Final  . HCT 09/81/1914 43.5  36.0 - 46.0 % Final  . MCV 04/22/2012 91.8  78.0 - 100.0 fL Final  . MCH 04/22/2012 30.2  26.0 - 34.0 pg Final  . MCHC 04/22/2012 32.9  30.0 - 36.0 g/dL Final  . RDW 78/29/5621 12.8  11.5 - 15.5 % Final  . Platelets 04/22/2012 251  150 - 400 K/uL Final  . Sodium 04/22/2012 137  135 - 145 mEq/L Final  . Potassium 04/22/2012 3.8  3.5 - 5.1 mEq/L Final  . Chloride 04/22/2012 103  96 - 112 mEq/L Final  . CO2 04/22/2012 25  19 - 32 mEq/L Final  . Glucose, Bld 04/22/2012 86  70 - 99 mg/dL Final  . BUN 30/86/5784 22  6 - 23 mg/dL Final  . Creatinine, Ser 04/22/2012 0.80  0.50 - 1.10 mg/dL Final  . Calcium 69/62/9528 9.4  8.4 - 10.5 mg/dL Final  . Total Protein 04/22/2012 6.6  6.0 - 8.3 g/dL Final  . Albumin 41/32/4401 3.9  3.5 - 5.2 g/dL Final  . AST 02/72/5366 26  0 - 37 U/L Final  . ALT 04/22/2012 30  0 - 35 U/L Final  . Alkaline Phosphatase 04/22/2012 105  39 - 117 U/L Final  . Total Bilirubin 04/22/2012 0.5  0.3 - 1.2 mg/dL Final  . GFR calc non Af Amer 04/22/2012 74* >90 mL/min Final    . GFR calc Af Amer 04/22/2012 85* >90 mL/min Final   Comment:                                 The eGFR has been calculated                          using the CKD EPI equation.                          This calculation has not been                          validated in all clinical                          situations.                          eGFR's persistently                          <90 mL/min signify                          possible Chronic Kidney Disease.  Marland Kitchen Prothrombin Time 04/22/2012 13.0  11.6 - 15.2 seconds Final  . INR 04/22/2012 0.99  0.00 - 1.49 Final  . Color, Urine 04/22/2012 YELLOW  YELLOW Final  . APPearance 04/22/2012 CLEAR  CLEAR Final  . Specific Gravity, Urine 04/22/2012 1.011  1.005 - 1.030 Final  . pH 04/22/2012 5.5  5.0 - 8.0 Final  .  Glucose, UA 04/22/2012 NEGATIVE  NEGATIVE mg/dL Final  . Hgb urine dipstick 04/22/2012 NEGATIVE  NEGATIVE Final  . Bilirubin Urine 04/22/2012 NEGATIVE  NEGATIVE Final  . Ketones, ur 04/22/2012 NEGATIVE  NEGATIVE mg/dL Final  . Protein, ur 16/10/9602 NEGATIVE  NEGATIVE mg/dL Final  . Urobilinogen, UA 04/22/2012 0.2  0.0 - 1.0 mg/dL Final  . Nitrite 54/09/8117 NEGATIVE  NEGATIVE Final  . Leukocytes, UA 04/22/2012 TRACE* NEGATIVE Final  . MRSA, PCR 04/22/2012 NEGATIVE  NEGATIVE Final  . Staphylococcus aureus 04/22/2012 NEGATIVE  NEGATIVE Final   Comment:                                 The Xpert SA Assay (FDA                          approved for NASAL specimens                          in patients over 85 years of age),                          is one component of                          a comprehensive surveillance                          program.  Test performance has                          been validated by [employer] for patients greater                          than or equal to 27 year old.                          It is not intended                          to diagnose infection nor to                           guide or monitor treatment.  . Squamous Epithelial / LPF 04/22/2012 FEW* RARE Final  . WBC, UA 04/22/2012 0-2  <3 WBC/hpf Final  Lab on 03/24/2012  Component Date Value Range Status  . Total Bilirubin 03/24/2012 1.1  0.3 - 1.2 mg/dL Final  . Bilirubin, Direct 03/24/2012 0.1  0.0 - 0.3 mg/dL Final  . Alkaline Phosphatase 03/24/2012 78  39 - 117 U/L Final  . AST 03/24/2012 23  0 - 37 U/L Final  . ALT 03/24/2012 26  0 - 35 U/L Final  . Total Protein 03/24/2012 6.8  6.0 - 8.3 g/dL Final  . Albumin 14/78/2956 4.0  3.5 - 5.2 g/dL Final     X-Rays:Dg Chest 2 View  04/22/2012  *RADIOLOGY REPORT*  Clinical Data: Preop total knee arthroplasty  CHEST - 2 VIEW  Comparison: None  Findings: There is no pleural effusion or edema.  No airspace consolidation identified.  The heart size is normal.  Scar like density noted in the left base.  Mild spondylosis within the thoracic spine.  IMPRESSION:  1.  Left base scarring. 2.  No pneumonia.   Original Report Authenticated By: Signa Kell, M.D.     EKG: Orders placed in visit on 04/07/12  . EKG 12-LEAD  . EKG 12-LEAD     Hospital Course: Kaitlin Dean is a 67 y.o. who was admitted to [facility]. They were brought to the operating room on 04/30/2012 and underwent Procedure(s): TOTAL KNEE ARTHROPLASTY.  Patient tolerated the procedure well and was later transferred to the recovery room and then to the orthopaedic floor for postoperative care.  They were given PO and IV analgesics for pain control following their surgery.  They were given 24 hours of postoperative antibiotics of  Anti-infectives   Start     Dose/Rate Route Frequency Ordered Stop   04/30/12 1900  ceFAZolin (ANCEF) IVPB 1 g/50 mL premix     1 g 100 mL/hr over 30 Minutes Intravenous Every 6 hours 04/30/12 1652 05/01/12 0104   04/30/12 1130  ceFAZolin (ANCEF) IVPB 2 g/50 mL premix     2 g 100 mL/hr over 30 Minutes Intravenous On call to O.R. 04/30/12 1128 04/30/12  1312     and started on DVT prophylaxis in the form of Xarelto.   PT and OT were ordered for total joint protocol.  Discharge planning consulted to help with postop disposition and equipment needs.  Patient had a good night on the evening of surgery.  They started to get up OOB with therapy on day one but she did get some dizziness that afternoon. Hemovac drain was pulled without difficulty.  Continued to work with therapy into day two.  Dressing was changed on day two and the incision was healing well.   Patient was seen in rounds on day 2 and it was felt that she would be ready likely the next day on Saturday, POD 3.  Arrangements were being made to go to [facility] on Saturday.  The patient will be rechecked on Saturday by the weekend coverage staff and if doing well, then will be transferred over to the SNF.   Discharge Medications: Prior to Admission medications   Medication Sig Start Date End Date Taking? Authorizing Provider  buPROPion (WELLBUTRIN SR) 100 MG 12 hr tablet Take 100 mg by mouth every morning.   Yes Historical Provider, MD  ezetimibe-simvastatin (VYTORIN) 10-20 MG per tablet Take 1 tablet by mouth at bedtime. 02/06/12  Yes Charm Barges, MD  omeprazole (PRILOSEC) 20 MG capsule Take 20 mg by mouth daily.   Yes Historical Provider, MD  telmisartan (MICARDIS) 40 MG tablet Take 40 mg by mouth every morning.    Yes Historical Provider, MD  acetaminophen (TYLENOL) 325 MG tablet Take 2 tablets (650 mg total) by mouth every 6 (six) hours as needed. 05/02/12   Alexzandrew Julien Girt, PA-C  bisacodyl (DULCOLAX) 10 MG suppository Place 1 suppository (10 mg total) rectally daily as needed. 05/02/12   Alexzandrew Julien Girt, PA-C  diphenhydrAMINE (BENADRYL) 12.5 MG/5ML elixir Take 5-10 mLs (12.5-25 mg total) by mouth every 4 (four) hours as needed for itching. 05/02/12   Alexzandrew Perkins, PA-C  docusate sodium 100 MG CAPS Take 100 mg by mouth 2 (two) times daily. 05/02/12   Alexzandrew Perkins, PA-C   methocarbamol (ROBAXIN) 500 MG tablet Take 1  tablet (500 mg total) by mouth every 6 (six) hours as needed. 05/02/12   Alexzandrew Perkins, PA-C  ondansetron (ZOFRAN) 4 MG tablet Take 1 tablet (4 mg total) by mouth every 6 (six) hours as needed for nausea. 05/02/12   Alexzandrew Perkins, PA-C  oxyCODONE (OXY IR/ROXICODONE) 5 MG immediate release tablet Take 1-2 tablets (5-10 mg total) by mouth every 3 (three) hours as needed. 05/02/12   Alexzandrew Perkins, PA-C  polyethylene glycol (MIRALAX / GLYCOLAX) packet Take 17 g by mouth daily as needed. 05/02/12   Alexzandrew Julien Girt, PA-C  rivaroxaban (XARELTO) 10 MG TABS tablet Take 1 tablet (10 mg total) by mouth daily with breakfast. Take Xarelto for two and a half more weeks, then discontinue Xarelto. 05/02/12   Alexzandrew Perkins, PA-C  traMADol (ULTRAM) 50 MG tablet Take 1-2 tablets (50-100 mg total) by mouth every 6 (six) hours as needed (mild pain). 05/02/12   Alexzandrew Julien Girt, PA-C    Diet: Cardiac diet Activity:WBAT Follow-up:in 2 weeks Disposition - Skilled nursing facility - Plan for [facility] Discharged Condition: Pending at time of summary. Transfer if doing well on Saturday 05/03/2012.   Discharge Orders   Future Appointments Provider Department Dept Phone   08/08/2012 9:45 AM Charm Barges, MD [facility] [phone]   Future Orders Complete By Expires     Call MD / Call 911  As directed     Comments:      If you experience chest pain or shortness of breath, CALL 911 and be transported to the hospital emergency room.  If you develope a fever above 101 F, pus (white drainage) or increased drainage or redness at the wound, or calf pain, call your surgeon's office.    Change dressing  As directed     Comments:      Change dressing daily with sterile 4 x 4 inch gauze dressing and apply TED hose. Do not submerge the incision under water.    Constipation Prevention  As directed     Comments:      Drink plenty of  fluids.  Prune juice may be helpful.  You may use a stool softener, such as Colace (over the counter) 100 mg twice a day.  Use MiraLax (over the counter) for constipation as needed.    Diet - low sodium heart healthy  As directed     Discharge instructions  As directed     Comments:      Pick up stool softner and laxative for home. Do not submerge incision under water. May shower. Continue to use ice for pain and swelling from surgery.  Take Xarelto for two and a half more weeks, then discontinue Xarelto.    Do not put a pillow under the knee. Place it under the heel.  As directed     Do not sit on low chairs, stoools or toilet seats, as it may be difficult to get up from low surfaces  As directed     Driving restrictions  As directed     Comments:      No driving until released by the physician.    Increase activity slowly as tolerated  As directed     Lifting restrictions  As directed     Comments:      No lifting until released by the physician.    Patient may shower  As directed     Comments:      You may shower without a dressing once there is  no drainage.  Do not wash over the wound.  If drainage remains, do not shower until drainage stops.    TED hose  As directed     Comments:      Use stockings (TED hose) for 3 weeks on both leg(s).  You may remove them at night for sleeping.    Weight bearing as tolerated  As directed         Medication List    STOP taking these medications       CALCIUM 500 +D 500-400 MG-UNIT Tabs  Generic drug:  Calcium Carb-Cholecalciferol     cholecalciferol 1000 UNITS tablet  Commonly known as:  VITAMIN D     multivitamin tablet     naproxen sodium 220 MG tablet  Commonly known as:  ANAPROX     vitamin B-12 100 MCG tablet  Commonly known as:  CYANOCOBALAMIN     vitamin E 1000 UNIT capsule      TAKE these medications       acetaminophen 325 MG tablet  Commonly known as:  TYLENOL  Take 2 tablets (650 mg total) by mouth every 6 (six)  hours as needed.     bisacodyl 10 MG suppository  Commonly known as:  DULCOLAX  Place 1 suppository (10 mg total) rectally daily as needed.     buPROPion 100 MG 12 hr tablet  Commonly known as:  WELLBUTRIN SR  Take 100 mg by mouth every morning.     diphenhydrAMINE 12.5 MG/5ML elixir  Commonly known as:  BENADRYL  Take 5-10 mLs (12.5-25 mg total) by mouth every 4 (four) hours as needed for itching.     DSS 100 MG Caps  Take 100 mg by mouth 2 (two) times daily.     ezetimibe-simvastatin 10-20 MG per tablet  Commonly known as:  VYTORIN  Take 1 tablet by mouth at bedtime.     methocarbamol 500 MG tablet  Commonly known as:  ROBAXIN  Take 1 tablet (500 mg total) by mouth every 6 (six) hours as needed.     omeprazole 20 MG capsule  Commonly known as:  PRILOSEC  Take 20 mg by mouth daily.     ondansetron 4 MG tablet  Commonly known as:  ZOFRAN  Take 1 tablet (4 mg total) by mouth every 6 (six) hours as needed for nausea.     oxyCODONE 5 MG immediate release tablet  Commonly known as:  Oxy IR/ROXICODONE  Take 1-2 tablets (5-10 mg total) by mouth every 3 (three) hours as needed.     polyethylene glycol packet  Commonly known as:  MIRALAX / GLYCOLAX  Take 17 g by mouth daily as needed.     rivaroxaban 10 MG Tabs tablet  Commonly known as:  XARELTO  Take 1 tablet (10 mg total) by mouth daily with breakfast. Take Xarelto for two and a half more weeks, then discontinue Xarelto.     telmisartan 40 MG tablet  Commonly known as:  MICARDIS  Take 40 mg by mouth every morning.     traMADol 50 MG tablet  Commonly known as:  ULTRAM  Take 1-2 tablets (50-100 mg total) by mouth every 6 (six) hours as needed (mild pain).           Follow-up Information   Follow up with Loanne Drilling, MD. Schedule an appointment as soon as possible for a visit in 2 weeks.   Contact information:   [address], [address], [address] [place] [address] [phone]        Signed: Patrica Duel 05/02/2012, 9:50 AM

## 2012-05-02 NOTE — Care Management Note (Signed)
    Page 1 of 1   05/02/2012     2:57:29 PM   CARE MANAGEMENT NOTE 05/02/2012  Patient:  Kaitlin Dean,Kaitlin Dean   Account Number:  [id number]  Date Initiated:  05/02/2012  Documentation initiated by:  Colleen Can  Subjective/Objective Assessment:   dx osteoarthritis left knee; total knee replacemnt     Action/Plan:   SNF rehab   Anticipated DC Date:  05/03/2012   Anticipated DC Plan:  SKILLED NURSING FACILITY  In-house referral  Clinical Social Worker      DC Planning Services  CM consult      Choice offered to / List presented to:             Status of service:  Completed, signed off Medicare Important Message given?   (If response is "NO", the following Medicare IM given date fields will be blank) Date Medicare IM given:   Date Additional Medicare IM given:    Discharge Disposition:    Per UR Regulation:    If discussed at Long Length of Stay Meetings, dates discussed:    Comments:

## 2012-05-02 NOTE — Progress Notes (Signed)
Physical Therapy Treatment Patient Details Name: Kaitlin Dean MRN: [id number] DOB: 05/19/1945 Today's Date: 05/02/2012 Time: 4098-1191 PT Time Calculation (min): 28 min  PT Assessment / Plan / Recommendation Comments on Treatment Session       Follow Up Recommendations  SNF     Does the patient have the potential to tolerate intense rehabilitation     Barriers to Discharge        Equipment Recommendations  None recommended by PT    Recommendations for Other Services OT consult  Frequency 7X/week   Plan Discharge plan remains appropriate    Precautions / Restrictions Precautions Precautions: Fall Restrictions Weight Bearing Restrictions: No Other Position/Activity Restrictions: WBAT   Pertinent Vitals/Pain 6-7/10; premed, RN aware, ice packs provided    Mobility  Bed Mobility Bed Mobility: Sit to Supine;Supine to Sit Supine to Sit: 4: Min guard Sit to Supine: 4: Min guard Details for Bed Mobility Assistance: cues for sequence and use of R LE to self assist Transfers Transfers: Sit to Stand;Stand to Sit Sit to Stand: 4: Min guard Stand to Sit: 4: Min guard Details for Transfer Assistance: cues for LE management and use of UEs to self assist Ambulation/Gait Ambulation/Gait Assistance: 4: Min assist;4: Min guard Ambulation Distance (Feet): 96 Feet (and 25) Assistive device: Rolling walker Ambulation/Gait Assistance Details: cues for stride length, posture and position from RW Gait Pattern: Step-to pattern;Decreased step length - right;Decreased step length - left    Exercises     PT Diagnosis:    PT Problem List:   PT Treatment Interventions:     PT Goals Acute Rehab PT Goals PT Goal Formulation: With patient Time For Goal Achievement: 05/01/12 Potential to Achieve Goals: Good Pt will go Supine/Side to Sit: with supervision PT Goal: Supine/Side to Sit - Progress: Progressing toward goal Pt will go Sit to Supine/Side: with supervision PT Goal: Sit to  Supine/Side - Progress: Progressing toward goal Pt will go Sit to Stand: with supervision PT Goal: Sit to Stand - Progress: Progressing toward goal Pt will go Stand to Sit: with supervision PT Goal: Stand to Sit - Progress: Progressing toward goal Pt will Ambulate: 51 - 150 feet;with supervision;with rolling walker PT Goal: Ambulate - Progress: Progressing toward goal  Visit Information  Last PT Received On: 05/02/12 Assistance Needed: +1    Subjective Data  Subjective: I'll walk as long as you don't try to pull on my leg Patient Stated Goal: rehab and home    Cognition  Cognition Overall Cognitive Status: Appears within functional limits for tasks assessed/performed Arousal/Alertness: Awake/alert Orientation Level: Appears intact for tasks assessed Behavior During Session: [facility] for tasks performed    Balance     End of Session PT - End of Session Equipment Utilized During Treatment: Gait belt Activity Tolerance: Patient tolerated treatment well Patient left: in bed;with call bell/phone within reach Nurse Communication: Mobility status   GP     Hussein Macdougal 05/02/2012, 3:08 PM

## 2012-05-02 NOTE — Progress Notes (Signed)
Pt has received authorization from [employer] for ST Rehab at [facility] . SNF is able to admit this weekend if pt is stable for d/c. Week end CSW will assist with d/c planning to SNF. Tentative D/C summary has been faxed to [facility].  Cori Razor LCSW [phone]

## 2012-05-02 NOTE — Progress Notes (Signed)
   Subjective: 2 Days Post-Op Procedure(s) (LRB): TOTAL KNEE ARTHROPLASTY (Left) Patient reports pain as mild.   Patient seen in rounds with Dr. Lequita Halt. She had some dizziness yesterday but better today.  Probably going to SNF tomorrow. Patient is well, and has had no acute complaints or problems Patient is planning on [employer].  Will go ahead and prepare the DC summary in anticipation of transfer in AM. The patient will get rechecked by the weekend coverage staff and then discharge if doing well.  Objective: Vital signs in last 24 hours: Temp:  [98.3 F (36.8 C)-99.6 F (37.6 C)] 99.6 F (37.6 C) (04/04 0541) Pulse Rate:  [81-101] 82 (04/04 0929) Resp:  [14-16] 14 (04/04 0541) BP: (104-114)/(67-73) 107/67 mmHg (04/04 0929) SpO2:  [93 %-96 %] 93 % (04/04 0541)  Intake/Output from previous day:  Intake/Output Summary (Last 24 hours) at 05/02/12 0942 Last data filed at 05/02/12 0541  Gross per 24 hour  Intake 1370.17 ml  Output   2700 ml  Net -1329.83 ml    Intake/Output this shift:    Labs:  Recent Labs  05/01/12 0355 05/02/12 0406  HGB 11.5* 12.0    Recent Labs  05/01/12 0355 05/02/12 0406  WBC 7.9 13.5*  RBC 3.74* 3.86*  HCT 33.8* 34.9*  PLT 248 265    Recent Labs  05/01/12 0355 05/02/12 0406  NA 134* 135  K 4.0 4.0  CL 102 102  CO2 26 24  BUN 11 7  CREATININE 0.85 0.70  GLUCOSE 151* 189*  CALCIUM 8.4 9.4   No results found for this basename: LABPT, INR,  in the last 72 hours  EXAM: General - Patient is Alert, Appropriate and Oriented Extremity - Neurovascular intact Sensation intact distally Dorsiflexion/Plantar flexion intact No cellulitis present Incision - clean, dry, no drainage, healing Motor Function - intact, moving foot and toes well on exam.   Assessment/Plan: 2 Days Post-Op Procedure(s) (LRB): TOTAL KNEE ARTHROPLASTY (Left) Procedure(s) (LRB): TOTAL KNEE ARTHROPLASTY (Left) Past Medical History  Diagnosis  Date  . Hypertension   . Hypercholesterolemia   . GERD (gastroesophageal reflux disease)   . 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 Skin cancer   . Barrett's esophagus   . Chicken pox   . Arthritis   . Complication of anesthesia     woke up 15 years ago and felt like could not breathe - no complications    Principal Problem:   OA (osteoarthritis) of knee  Estimated body mass index is 34.73 kg/(m^2) as calculated from the following:   Height as of this encounter: 5\' 3"  (1.6 m).   Weight as of this encounter: 88.905 kg (196 lb). Up with therapy Plan for discharge tomorrow Discharge to SNF Diet - Cardiac diet Follow up - in 2 weeks Activity - WBAT Disposition - Skilled nursing facility - Plan for [facility] D/C Meds - See DC Summary DVT Prophylaxis - Xarelto  PERKINS, ALEXZANDREW 05/02/2012, 9:42 AM

## 2012-05-02 NOTE — Progress Notes (Signed)
Physical Therapy Treatment Patient Details Name: Kaitlin Dean MRN: [id number] DOB: 1945/10/14 Today's Date: 05/02/2012 Time: 0865-7846 PT Time Calculation (min): 34 min  PT Assessment / Plan / Recommendation Comments on Treatment Session       Follow Up Recommendations  SNF     Does the patient have the potential to tolerate intense rehabilitation     Barriers to Discharge        Equipment Recommendations  None recommended by PT    Recommendations for Other Services OT consult  Frequency 7X/week   Plan Discharge plan remains appropriate    Precautions / Restrictions Precautions Precautions: Fall Required Braces or Orthoses: Knee Immobilizer - Left Knee Immobilizer - Left: Discontinue once straight leg raise with < 10 degree lag Restrictions Weight Bearing Restrictions: No Other Position/Activity Restrictions: WBAT   Pertinent Vitals/Pain 6-7/10; premed, RN aware, ice packs provided    Mobility  Transfers Transfers: Sit to Stand;Stand to Sit Sit to Stand: 4: Min assist Stand to Sit: 4: Min assist Details for Transfer Assistance: cues for LE management and use of UEs to self assist Ambulation/Gait Ambulation/Gait Assistance: 4: Min assist Ambulation Distance (Feet): 48 Feet Assistive device: Rolling walker Ambulation/Gait Assistance Details: min cues for posture, position from RW and stride lenth Gait Pattern: Step-to pattern;Decreased step length - right;Decreased step length - left    Exercises Total Joint Exercises Ankle Circles/Pumps: AROM;Supine;Both;15 reps Quad Sets: AROM;Both;Supine;15 reps Heel Slides: AAROM;Left;Supine;15 reps Straight Leg Raises: AAROM;Left;Supine;15 reps;AROM Long Arc Quad: 10 reps;Both;AROM;AAROM;Seated   PT Diagnosis:    PT Problem List:   PT Treatment Interventions:     PT Goals Acute Rehab PT Goals PT Goal Formulation: With patient Time For Goal Achievement: 05/01/12 Potential to Achieve Goals: Good Pt will go Supine/Side  to Sit: with supervision PT Goal: Supine/Side to Sit - Progress: Progressing toward goal Pt will go Sit to Supine/Side: with supervision PT Goal: Sit to Supine/Side - Progress: Progressing toward goal Pt will go Sit to Stand: with supervision PT Goal: Sit to Stand - Progress: Progressing toward goal Pt will go Stand to Sit: with supervision PT Goal: Stand to Sit - Progress: Progressing toward goal Pt will Ambulate: 51 - 150 feet;with supervision;with rolling walker PT Goal: Ambulate - Progress: Progressing toward goal  Visit Information  Last PT Received On: 05/02/12 Assistance Needed: +1    Subjective Data  Subjective: I am doing a lot better than yesterday Patient Stated Goal: rehab and home    Cognition  Cognition Overall Cognitive Status: Appears within functional limits for tasks assessed/performed Arousal/Alertness: Awake/alert Orientation Level: Appears intact for tasks assessed Behavior During Session: [facility] for tasks performed    Balance     End of Session PT - End of Session Equipment Utilized During Treatment: Gait belt Activity Tolerance: Patient tolerated treatment well Patient left: in chair;with call bell/phone within reach Nurse Communication: Mobility status CPM Left Knee CPM Left Knee: Off   GP     Kaitlin Dean 05/02/2012, 10:14 AM

## 2012-05-02 NOTE — Progress Notes (Signed)
OT Cancellation Note  Patient Details Name: Kaitlin Dean MRN: [id number] DOB: 04/05/1945   Cancelled Treatment:    Reason Eval/Treat Not Completed: Other (comment) (Pt plans to go to SNF. Will defer OT eval to sNF)  Sheena Donegan, [place] Kung 05/02/2012, 11:52 AM

## 2012-05-03 LAB — CBC
HCT: 30 % — ABNORMAL LOW (ref 36.0–46.0)
MCV: 90.6 fL (ref 78.0–100.0)
RBC: 3.31 MIL/uL — ABNORMAL LOW (ref 3.87–5.11)
WBC: 13.5 10*3/uL — ABNORMAL HIGH (ref 4.0–10.5)

## 2012-05-03 MED ORDER — FERROUS SULFATE 325 (65 FE) MG PO TABS: 325.0000 mg | ORAL_TABLET | Freq: Two times a day (BID) | ORAL | Status: DC

## 2012-05-03 NOTE — Progress Notes (Signed)
Subjective: 3 Days Post-Op Procedure(s) (LRB): TOTAL KNEE ARTHROPLASTY (Left) Patient reports pain as well controled on current pain meds.  Patient denies any nausea at this time and is tolerating food. She continues to progress well with PT. Denies any numbness or tingling in the left lower extremity.     Objective: Vital signs in last 24 hours: Temp:  [98.2 F (36.8 C)-98.9 F (37.2 C)] 98.7 F (37.1 C) (04/05 0551) Pulse Rate:  [82-97] 88 (04/05 0551) Resp:  [16-18] 16 (04/05 0551) BP: (92-113)/(61-72) 113/68 mmHg (04/05 0551) SpO2:  [92 %-96 %] 92 % (04/05 0551)  Intake/Output from previous day: 04/04 0701 - 04/05 0700 In: 720 [P.O.:720] Out: 1500 [Urine:1500] Intake/Output this shift: Total I/O In: 240 [P.O.:240] Out: -    Recent Labs  05/01/12 0355 05/02/12 0406 05/03/12 0430  HGB 11.5* 12.0 10.6*    Recent Labs  05/02/12 0406 05/03/12 0430  WBC 13.5* 13.5*  RBC 3.86* 3.31*  HCT 34.9* 30.0*  PLT 265 237    Recent Labs  05/01/12 0355 05/02/12 0406  NA 134* 135  K 4.0 4.0  CL 102 102  CO2 26 24  BUN 11 7  CREATININE 0.85 0.70  GLUCOSE 151* 189*  CALCIUM 8.4 9.4   No results found for this basename: LABPT, INR,  in the last 72 hours  Well nourished female. Alert and Oriented x3. Lungs clear. RRR no mumurs note. Hypoactive bowel sounds x4. ABD S/NT. Left knee midline incision Clean, dry and intact. No signs on redness or drainage. Calf is supple and non-tender. Post-tib +2. Neurovascularly intact on left LE.   Assessment/Plan: 3 Days Post-Op Procedure(s) (LRB): TOTAL KNEE ARTHROPLASTY (Left) D/C to [facility] today and follow D/C instructions. Continue all current medications. Continue PT   Lothar Prehn L 05/03/2012, 8:56 AM

## 2012-05-03 NOTE — Progress Notes (Signed)
Physical Therapy Treatment Patient Details Name: Kaitlin Dean MRN: [id number] DOB: 06-Mar-1945 Today's Date: 05/03/2012 Time: 0865-7846 PT Time Calculation (min): 27 min  PT Assessment / Plan / Recommendation Comments on Treatment Session       Follow Up Recommendations  SNF     Does the patient have the potential to tolerate intense rehabilitation     Barriers to Discharge        Equipment Recommendations  None recommended by PT    Recommendations for Other Services OT consult  Frequency 7X/week   Plan Discharge plan remains appropriate    Precautions / Restrictions Precautions Precautions: Fall Required Braces or Orthoses: Knee Immobilizer - Left Knee Immobilizer - Left: Discontinue once straight leg raise with < 10 degree lag (Pt performed IND SLR this am) Restrictions Weight Bearing Restrictions: No Other Position/Activity Restrictions: WBAT   Pertinent Vitals/Pain 4/10; premed, cold packs provided    Mobility  Bed Mobility Bed Mobility: Supine to Sit Supine to Sit: 5: Supervision Transfers Transfers: Sit to Stand;Stand to Sit Sit to Stand: 4: Min guard Stand to Sit: 4: Min guard Details for Transfer Assistance: cues for LE management and use of UEs to self assist Ambulation/Gait Ambulation/Gait Assistance: 4: Min guard;5: Supervision Ambulation Distance (Feet): 150 Feet Assistive device: Rolling walker Ambulation/Gait Assistance Details: min cues for position from RW and posture Gait Pattern: Step-to pattern;Step-through pattern General Gait Details: pt progressed to recip gait    Exercises Total Joint Exercises Ankle Circles/Pumps: AROM;Supine;Both;20 reps Quad Sets: AROM;Both;Supine;20 reps Heel Slides: AAROM;Left;Supine;20 reps Straight Leg Raises: Left;Supine;AROM;20 reps Long Arc Quad: 10 reps;Both;AROM;AAROM;Seated   PT Diagnosis:    PT Problem List:   PT Treatment Interventions:     PT Goals Acute Rehab PT Goals PT Goal Formulation: With  patient Time For Goal Achievement: 05/01/12 Potential to Achieve Goals: Good Pt will go Supine/Side to Sit: with supervision PT Goal: Supine/Side to Sit - Progress: Met Pt will go Sit to Supine/Side: with supervision PT Goal: Sit to Supine/Side - Progress: Progressing toward goal Pt will go Sit to Stand: with supervision PT Goal: Sit to Stand - Progress: Progressing toward goal Pt will go Stand to Sit: with supervision PT Goal: Stand to Sit - Progress: Progressing toward goal Pt will Ambulate: 51 - 150 feet;with supervision;with rolling walker PT Goal: Ambulate - Progress: Progressing toward goal  Visit Information  Last PT Received On: 05/03/12 Assistance Needed: +1    Subjective Data  Subjective: I am leaving today Patient Stated Goal: rehab and home    Cognition  Cognition Overall Cognitive Status: Appears within functional limits for tasks assessed/performed Arousal/Alertness: Awake/alert Orientation Level: Appears intact for tasks assessed Behavior During Session: [facility] for tasks performed    Balance     End of Session PT - End of Session Equipment Utilized During Treatment: Gait belt Activity Tolerance: Patient tolerated treatment well Patient left: in chair;with call bell/phone within reach Nurse Communication: Mobility status   GP     Kaitlin Dean 05/03/2012, 11:08 AM

## 2012-05-03 NOTE — Progress Notes (Signed)
Per MD, Pt ready for d/c.  Notified RN, Pt and facility.  D/c summary sent Friday.  Facility ready to receive Pt.  Pt to be transported by private vehicle.  Pt provided with d/c packet, including scripts, to give to facility.  Pt to be d/c'd.  Providence Crosby, LCSWA Clinical Social Work [phone]

## 2012-05-05 NOTE — Progress Notes (Signed)
Clinical Social Work Department CLINICAL SOCIAL WORK PLACEMENT NOTE 05/05/2012  Patient:  Kaitlin Dean,Kaitlin Dean  Account Number:  [id number] Admit date:  04/30/2012  Clinical Social Worker:  Cori Razor, LCSW  Date/time:  05/01/2012 01:27 PM  Clinical Social Work is seeking post-discharge placement for this patient at the following level of care:   SKILLED NURSING   (*CSW will update this form in Epic as items are completed)   05/01/2012  Patient/family provided with [facility] Department of Clinical Social Work's list of facilities offering this level of care within the geographic area requested by the patient (or if unable, by the patient's family).  05/01/2012  Patient/family informed of their freedom to choose among providers that offer the needed level of care, that participate in Medicare, Medicaid or managed care program needed by the patient, have an available bed and are willing to accept the patient.    Patient/family informed of [facility]' ownership interest in [facility], as well as of the fact that they are under no obligation to receive care at this facility.  PASARR submitted to EDS on 05/01/2012 PASARR number received from EDS on 05/01/2012  FL2 transmitted to all facilities in geographic area requested by pt/family on  05/01/2012 FL2 transmitted to all facilities within larger geographic area on   Patient informed that his/her managed care company has contracts with or will negotiate with  certain facilities, including the following:     Patient/family informed of bed offers received:  05/01/2012 Patient chooses bed at [facility] Physician recommends and patient chooses bed at    Patient to be transferred to [facility] on  05/03/2012 Patient to be transferred to facility by [facility]. The following physician request were entered in Epic:   Additional Comments:

## 2012-05-28 ENCOUNTER — Encounter: Payer: Self-pay | Admitting: Orthopedic Surgery

## 2012-05-29 ENCOUNTER — Encounter: Payer: Self-pay | Admitting: Orthopedic Surgery

## 2012-05-29 DEATH — deceased

## 2012-06-20 ENCOUNTER — Ambulatory Visit: Payer: Medicare PPO | Admitting: Internal Medicine

## 2012-06-29 ENCOUNTER — Encounter: Payer: Self-pay | Admitting: Orthopedic Surgery

## 2012-07-29 ENCOUNTER — Telehealth: Payer: Self-pay | Admitting: Internal Medicine

## 2012-07-29 DIAGNOSIS — S329XXA Fracture of unspecified parts of lumbosacral spine and pelvis, initial encounter for closed fracture: Secondary | ICD-10-CM

## 2012-07-29 NOTE — Telephone Encounter (Signed)
Order placed for ortho referral.   

## 2012-07-29 NOTE — Telephone Encounter (Signed)
Pt called stating she went to urgent care over the weekend and she has fractured pelivic and would like referral to orthopedic Pt would like to go to someone in [place]

## 2012-08-07 ENCOUNTER — Other Ambulatory Visit: Payer: Self-pay | Admitting: *Deleted

## 2012-08-07 MED ORDER — EZETIMIBE-SIMVASTATIN 10-20 MG PO TABS: 1.0000 | ORAL_TABLET | Freq: Every day | ORAL | Status: DC

## 2012-08-08 ENCOUNTER — Encounter: Payer: Self-pay | Admitting: Internal Medicine

## 2012-08-08 ENCOUNTER — Ambulatory Visit (INDEPENDENT_AMBULATORY_CARE_PROVIDER_SITE_OTHER): Payer: Medicare PPO | Admitting: Internal Medicine

## 2012-08-08 VITALS — BP 110/80 | HR 89 | Temp 98.6°F | Ht 63.5 in | Wt 184.8 lb

## 2012-08-08 DIAGNOSIS — I10 Essential (primary) hypertension: Secondary | ICD-10-CM

## 2012-08-08 DIAGNOSIS — K2270 Barrett's esophagus without dysplasia: Secondary | ICD-10-CM

## 2012-08-08 DIAGNOSIS — E7800 Pure hypercholesterolemia, unspecified: Secondary | ICD-10-CM

## 2012-08-08 DIAGNOSIS — B977 Papillomavirus as the cause of diseases classified elsewhere: Secondary | ICD-10-CM

## 2012-08-08 DIAGNOSIS — M1710 Unilateral primary osteoarthritis, unspecified knee: Secondary | ICD-10-CM

## 2012-08-08 DIAGNOSIS — K219 Gastro-esophageal reflux disease without esophagitis: Secondary | ICD-10-CM

## 2012-08-08 DIAGNOSIS — IMO0001 Reserved for inherently not codable concepts without codable children: Secondary | ICD-10-CM

## 2012-08-08 DIAGNOSIS — R6889 Other general symptoms and signs: Secondary | ICD-10-CM

## 2012-08-08 DIAGNOSIS — IMO0002 Reserved for concepts with insufficient information to code with codable children: Secondary | ICD-10-CM

## 2012-08-08 DIAGNOSIS — S3289XD Fracture of other parts of pelvis, subsequent encounter for fracture with routine healing: Secondary | ICD-10-CM

## 2012-08-10 ENCOUNTER — Encounter: Payer: Self-pay | Admitting: Internal Medicine

## 2012-08-10 ENCOUNTER — Telehealth: Payer: Self-pay | Admitting: Internal Medicine

## 2012-08-10 DIAGNOSIS — I10 Essential (primary) hypertension: Secondary | ICD-10-CM

## 2012-08-10 DIAGNOSIS — E7800 Pure hypercholesterolemia, unspecified: Secondary | ICD-10-CM

## 2012-08-10 NOTE — Progress Notes (Signed)
Subjective:    Patient ID: Kaitlin Dean, female    DOB: 1945/04/13, 67 y.o.   MRN: [id number]  HPI 67 year old female with past history of hypertension and hypercholesterolemia who comes in today for a scheduled follow up.  She states she is doing relatively well.  Is s/p knee surgery.  Was doing well.  Exercising, etc.  Then developed groin pain.  Couldn't walk well.  Was diagnosed with a pelvic fracture.  Was walking with a walker and has now graduated to using a cane.  Is better.  Still with pain. Seeing Dedra Skeens.  Otherwise doing well.  Denies any chest pain or tightness.  No sob.  No nausea or vomiting.  No bowel change.  No increased acid reflux.     Past Medical History  Diagnosis Date  . Hypertension   . Hypercholesterolemia   . GERD (gastroesophageal reflux disease)   . 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 Skin cancer   . Barrett's esophagus   . Chicken pox   . Arthritis   . Complication of anesthesia     woke up 15 years ago and felt like could not breathe - no complications     Current Outpatient Prescriptions on File Prior to Visit  Medication Sig Dispense Refill  . buPROPion (WELLBUTRIN SR) 100 MG 12 hr tablet Take 100 mg by mouth every morning.      . ezetimibe-simvastatin (VYTORIN) 10-20 MG per tablet Take 1 tablet by mouth at bedtime.  30 tablet  5  . methocarbamol (ROBAXIN) 500 MG tablet Take 1 tablet (500 mg total) by mouth every 6 (six) hours as needed.  80 tablet  0  . omeprazole (PRILOSEC) 20 MG capsule Take 20 mg by mouth daily.      Marland Kitchen telmisartan (MICARDIS) 40 MG tablet Take 40 mg by mouth every morning.        No current facility-administered medications on file prior to visit.    Review of Systems Patient denies any headache, lightheadedness or dizziness.  No sinus or allergy symptoms.   No chest pain, tightness or palpitations.  No increased shortness of breath, cough or congestion.  No nausea or vomiting.  No acid reflux.  No abdominal pain  or cramping.  No bowel change, such as diarrhea, constipation, BRBPR or melana.  No urine change.  Planning for knee surgery as outlined.      Objective:   Physical Exam  Filed Vitals:   08/08/12 1001  BP: 110/80  Pulse: 89  Temp: 98.6 F (72 C)   67 year old female in no acute distress.   HEENT:  Nares- clear.  Oropharynx - without lesions. NECK:  Supple.  Nontender.  No audible bruit.  HEART:  Appears to be regular. LUNGS:  No crackles or wheezing audible.  Respirations even and unlabored.  RADIAL PULSE:  Equal bilaterally.  ABDOMEN:  Soft, nontender.  Bowel sounds present and normal.  No audible abdominal bruit.    EXTREMITIES:  No increased edema present.  DP pulses palpable and equal bilaterally. MSK:  Increased pain in her groin with ambulation.  No pain with abduction of her leg.  No pain with rotation of her hip.           Assessment & Plan:  MSK.  S/p knee surgery.  Has done well with her knee surgery.  Continue to follow up with ortho.  Also, now s/p pelvic fracture.  Still with pain, but states improved.  Walking with a cane now.  Continue follow up with ortho.    INCREASED PSYCHOSOCIAL STRESSORS.  Doing well on Wellbutrin.  Follow.    HEALTH MAINTENANCE.  Physical 02/04/12.  Colonoscopy 05/03/04 with diverticulosis and internal hemorrhoids.  Recommended follow up colonoscopy 04/2014.  Pap negative but HPV is positive.  Declines referral back to GYN.

## 2012-08-10 NOTE — Assessment & Plan Note (Signed)
Reflux symptoms controlled.  Would avoid antiinflammatories.   

## 2012-08-10 NOTE — Assessment & Plan Note (Signed)
Last EGD 11/11.  Currently doing well.  Recommended follow up EGD 2014.   

## 2012-08-10 NOTE — Assessment & Plan Note (Signed)
S/p knee surgery and doing well regarding her knee surgery.  Continues to follow up with ortho.   

## 2012-08-10 NOTE — Assessment & Plan Note (Signed)
Previously evaluated at [facility].  Biopsy negative for dysplasia.  Repeat pap negative with positive HPV.  She declines referral back to gyn.  Will get her back in for f/u pap after pelvic fracture healed.

## 2012-08-10 NOTE — Telephone Encounter (Signed)
Please change her f/u appt to a f/u pelvic ( ) and instead of f/u in 3 months - please schedule the pelvic/pap for 2 months.   Thanks.

## 2012-08-10 NOTE — Assessment & Plan Note (Signed)
Blood pressure under good control.  Same medication regimen.  Follow metabolic panel.   

## 2012-08-10 NOTE — Assessment & Plan Note (Addendum)
Back on vytorin.  Follow lipid panel and liver function.

## 2012-08-11 NOTE — Telephone Encounter (Signed)
Order placed for f/u lab prior to appt.  Yes I do want her to have labs prior to her appt.

## 2012-08-11 NOTE — Telephone Encounter (Signed)
Appointment 10/17/12 pt aware.  Patient wanted to know if she needed lab work prior to appointment in sept

## 2012-08-11 NOTE — Telephone Encounter (Signed)
Lab appt scheduled for 9/17 @ 8:30 & pt informed

## 2012-09-08 ENCOUNTER — Other Ambulatory Visit: Payer: Self-pay | Admitting: *Deleted

## 2012-09-08 MED ORDER — BUPROPION HCL ER (SR) 100 MG PO TB12: 100.0000 mg | ORAL_TABLET | Freq: Every morning | ORAL | Status: DC

## 2012-10-07 ENCOUNTER — Other Ambulatory Visit: Payer: Self-pay | Admitting: *Deleted

## 2012-10-07 MED ORDER — TELMISARTAN 40 MG PO TABS: 40.0000 mg | ORAL_TABLET | Freq: Every morning | ORAL | Status: DC

## 2012-10-14 ENCOUNTER — Other Ambulatory Visit (INDEPENDENT_AMBULATORY_CARE_PROVIDER_SITE_OTHER): Payer: Medicare PPO

## 2012-10-14 DIAGNOSIS — E7800 Pure hypercholesterolemia, unspecified: Secondary | ICD-10-CM

## 2012-10-14 DIAGNOSIS — I10 Essential (primary) hypertension: Secondary | ICD-10-CM

## 2012-10-14 LAB — BASIC METABOLIC PANEL
CO2: 27 mEq/L (ref 19–32)
GFR: 64.23 mL/min (ref >60.00)
Glucose, Bld: 108 mg/dL — ABNORMAL HIGH (ref 70–99)
Potassium: 3.9 mEq/L (ref 3.5–5.1)
Sodium: 140 mEq/L (ref 135–145)

## 2012-10-14 LAB — LIPID PANEL: VLDL: 20.2 mg/dL (ref 0.0–40.0)

## 2012-10-14 LAB — HEPATIC FUNCTION PANEL
AST: 24 U/L (ref 0–37)
Albumin: 4.3 g/dL (ref 3.5–5.2)
Alkaline Phosphatase: 102 U/L (ref 39–117)
Total Protein: 6.8 g/dL (ref 6.0–8.3)

## 2012-10-15 ENCOUNTER — Other Ambulatory Visit: Payer: Medicare PPO

## 2012-10-17 ENCOUNTER — Encounter: Payer: Self-pay | Admitting: Internal Medicine

## 2012-10-17 ENCOUNTER — Ambulatory Visit (INDEPENDENT_AMBULATORY_CARE_PROVIDER_SITE_OTHER): Payer: Medicare PPO | Admitting: Internal Medicine

## 2012-10-17 ENCOUNTER — Other Ambulatory Visit (HOSPITAL_COMMUNITY)
Admission: RE | Admit: 2012-10-17 | Discharge: 2012-10-17 | Disposition: A | Discharge status: Home / Self Care | Payer: Medicare PPO | Source: Ambulatory Visit | Attending: Internal Medicine | Admitting: Internal Medicine

## 2012-10-17 VITALS — BP 110/80 | HR 83 | Temp 97.9°F | Ht 63.5 in | Wt 184.2 lb

## 2012-10-17 DIAGNOSIS — I10 Essential (primary) hypertension: Secondary | ICD-10-CM

## 2012-10-17 DIAGNOSIS — B977 Papillomavirus as the cause of diseases classified elsewhere: Secondary | ICD-10-CM

## 2012-10-17 DIAGNOSIS — Z124 Encounter for screening for malignant neoplasm of cervix: Secondary | ICD-10-CM

## 2012-10-17 DIAGNOSIS — K2270 Barrett's esophagus without dysplasia: Secondary | ICD-10-CM

## 2012-10-17 DIAGNOSIS — Z1151 Encounter for screening for human papillomavirus (HPV): Secondary | ICD-10-CM | POA: Insufficient documentation

## 2012-10-17 DIAGNOSIS — Z01419 Encounter for gynecological examination (general) (routine) without abnormal findings: Secondary | ICD-10-CM | POA: Insufficient documentation

## 2012-10-17 DIAGNOSIS — R6889 Other general symptoms and signs: Secondary | ICD-10-CM

## 2012-10-17 DIAGNOSIS — IMO0002 Reserved for concepts with insufficient information to code with codable children: Secondary | ICD-10-CM

## 2012-10-17 DIAGNOSIS — R87810 Cervical high risk human papillomavirus (HPV) DNA test positive: Secondary | ICD-10-CM | POA: Insufficient documentation

## 2012-10-17 DIAGNOSIS — M1710 Unilateral primary osteoarthritis, unspecified knee: Secondary | ICD-10-CM

## 2012-10-19 ENCOUNTER — Encounter: Payer: Self-pay | Admitting: Internal Medicine

## 2012-10-19 NOTE — Assessment & Plan Note (Signed)
Last EGD 11/11.  Currently doing well.  Recommended follow up EGD 2014.

## 2012-10-19 NOTE — Assessment & Plan Note (Signed)
Blood pressure under good control.  Same medication regimen.  Follow metabolic panel.   

## 2012-10-19 NOTE — Assessment & Plan Note (Signed)
Previously evaluated at [facility].  Biopsy negative for dysplasia.  Repeat pap negative with positive HPV.  She declines referral back to gyn.  Pelvic/pap today.

## 2012-10-19 NOTE — Progress Notes (Signed)
Subjective:    Patient ID: Kaitlin Dean, female    DOB: 11/27/1945, 67 y.o.   MRN: [id number]  HPI 67 year old female with past history of hypertension and hypercholesterolemia who comes in today for a scheduled follow up and for a f/u pelvic/pap.  Previously had positive HPV.  Referred to gyn.  Worked up.  Declined referral back for continued positive HPV.  She states she is doing relatively well. Is s/p knee surgery.  Doing well.   Diagnosed with a pelvic fracture.  Saw Dedra Skeens.  Pain resolved.  Doing well.  Getting around better.   Denies any chest pain or tightness.  No sob.  No nausea or vomiting.  No bowel change.  No increased acid reflux.     Past Medical History  Diagnosis Date  . Hypertension   . Hypercholesterolemia   . GERD (gastroesophageal reflux disease)   . 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 Skin cancer   . Barrett's esophagus   . Chicken pox   . Arthritis   . Complication of anesthesia     woke up 15 years ago and felt like could not breathe - no complications     Current Outpatient Prescriptions on File Prior to Visit  Medication Sig Dispense Refill  . aspirin 81 MG tablet Take 81 mg by mouth daily.      Marland Kitchen buPROPion (WELLBUTRIN SR) 100 MG 12 hr tablet Take 1 tablet (100 mg total) by mouth every morning.  30 tablet  2  . ezetimibe-simvastatin (VYTORIN) 10-20 MG per tablet Take 1 tablet by mouth at bedtime.  30 tablet  5  . naproxen sodium (ANAPROX) 220 MG tablet Take 220 mg by mouth as needed.      Marland Kitchen omeprazole (PRILOSEC) 20 MG capsule Take 20 mg by mouth daily.      Marland Kitchen telmisartan (MICARDIS) 40 MG tablet Take 1 tablet (40 mg total) by mouth every morning.  30 tablet  5   No current facility-administered medications on file prior to visit.    Review of Systems Patient denies any headache, lightheadedness or dizziness.  No sinus or allergy symptoms.   No chest pain, tightness or palpitations.  No increased shortness of breath, cough or congestion.   No nausea or vomiting.  No acid reflux.  No abdominal pain or cramping.  No bowel change, such as diarrhea, constipation, BRBPR or melana.  No urine change.  Doing better walking and ambulating.       Objective:   Physical Exam  Filed Vitals:   10/17/12 1034  BP: 110/80  Pulse: 83  Temp: 97.9 F (23.58 C)   67 year old female in no acute distress.   HEENT:  Nares- clear.  Oropharynx - without lesions. NECK:  Supple.  Nontender.  No audible bruit.  HEART:  Appears to be regular. LUNGS:  No crackles or wheezing audible.  Respirations even and unlabored.  RADIAL PULSE:  Equal bilaterally.   ABDOMEN:  Soft, nontender.  Bowel sounds present and normal.  No audible abdominal bruit.  GU:  Normal external genitalia.  Vaginal vault without lesions.  Cervix identified.  Pap performed. Some atrophy changes present.  Could not appreciate any adnexal masses or tenderness.   EXTREMITIES:  No increased edema present.  DP pulses palpable and equal bilaterally.          Assessment & Plan:  MSK.  S/p knee surgery.  Has done well.  With pelvic fracture.  Doing well.  No pain.  Follow.   INCREASED PSYCHOSOCIAL STRESSORS.  Doing well on Wellbutrin.  Follow.    HEALTH MAINTENANCE.  Physical 02/04/12.  Colonoscopy 05/03/04 with diverticulosis and internal hemorrhoids.  Recommended follow up colonoscopy 04/2014.  Pap negative but HPV is positive.  Declines referral back to GYN.  Repeat pap today.

## 2012-10-19 NOTE — Assessment & Plan Note (Signed)
S/p knee surgery and doing well regarding her knee surgery.  Continues to follow up with ortho.   

## 2012-10-24 ENCOUNTER — Ambulatory Visit: Payer: Self-pay | Admitting: Internal Medicine

## 2012-10-27 ENCOUNTER — Encounter: Payer: Self-pay | Admitting: Internal Medicine

## 2012-10-27 DIAGNOSIS — K219 Gastro-esophageal reflux disease without esophagitis: Secondary | ICD-10-CM

## 2012-11-11 ENCOUNTER — Ambulatory Visit: Payer: Medicare PPO | Admitting: Internal Medicine

## 2012-11-12 ENCOUNTER — Encounter: Payer: Self-pay | Admitting: Internal Medicine

## 2012-11-17 ENCOUNTER — Encounter: Payer: Self-pay | Admitting: Internal Medicine

## 2012-11-17 DIAGNOSIS — IMO0002 Reserved for concepts with insufficient information to code with codable children: Secondary | ICD-10-CM

## 2012-11-18 NOTE — Telephone Encounter (Signed)
Called pt.  Updated immunization.  Will refer to [facility] for abnormal pap

## 2012-12-04 ENCOUNTER — Other Ambulatory Visit: Payer: Self-pay | Admitting: *Deleted

## 2012-12-04 MED ORDER — BUPROPION HCL ER (SR) 100 MG PO TB12: 100.0000 mg | ORAL_TABLET | Freq: Every morning | ORAL | Status: DC

## 2013-02-10 ENCOUNTER — Other Ambulatory Visit: Payer: Self-pay | Admitting: *Deleted

## 2013-02-10 ENCOUNTER — Telehealth: Payer: Self-pay | Admitting: *Deleted

## 2013-02-10 MED ORDER — BUPROPION HCL ER (SR) 150 MG PO TB12: 100.0000 mg | ORAL_TABLET | Freq: Every morning | ORAL | Status: DC

## 2013-02-10 MED ORDER — BUPROPION HCL ER (SR) 100 MG PO TB12: 100.0000 mg | ORAL_TABLET | Freq: Every morning | ORAL | Status: DC

## 2013-02-10 MED ORDER — EZETIMIBE-SIMVASTATIN 10-20 MG PO TABS: 1.0000 | ORAL_TABLET | Freq: Every day | ORAL | Status: DC

## 2013-02-10 NOTE — Telephone Encounter (Signed)
Pt notified of the error & states that she does fine on the 150mg . I called [facility] & informed them to continue to fill the 150mg .

## 2013-02-10 NOTE — Telephone Encounter (Signed)
Please call pt and inform her of the error.  If she has been doing well on the 150mg , I am ok to leave her on the 150mg  dose.  Let me know if any problems.

## 2013-02-10 NOTE — Telephone Encounter (Signed)
The pharmacy called to inform you that they caught an error on their part. They started filling her Wellbutrin SR 150 mg 2 months ago in error. Pt should have been on 100 mg. They want to know if you would rather her stay on the 150 mg or change to the 100 mg? It was originally sent in for 100 mg, but they accidentally filled 150 mg. Please advise

## 2013-02-17 ENCOUNTER — Encounter: Payer: Self-pay | Admitting: Internal Medicine

## 2013-02-17 ENCOUNTER — Ambulatory Visit (INDEPENDENT_AMBULATORY_CARE_PROVIDER_SITE_OTHER): Payer: Medicare PPO | Admitting: Internal Medicine

## 2013-02-17 VITALS — BP 110/80 | HR 84 | Temp 98.2°F | Ht 63.5 in | Wt 192.5 lb

## 2013-02-17 DIAGNOSIS — J069 Acute upper respiratory infection, unspecified: Secondary | ICD-10-CM

## 2013-02-17 MED ORDER — BUDESONIDE 180 MCG/ACT IN AEPB: 1.0000 | INHALATION_SPRAY | Freq: Two times a day (BID) | RESPIRATORY_TRACT | Status: DC

## 2013-02-17 MED ORDER — CEFUROXIME AXETIL 250 MG PO TABS: 250.0000 mg | ORAL_TABLET | Freq: Two times a day (BID) | ORAL | Status: DC

## 2013-02-17 MED ORDER — FLUTICASONE PROPIONATE 50 MCG/ACT NA SUSP: 2.0000 | Freq: Every day | NASAL | Status: DC

## 2013-02-17 MED ORDER — ALBUTEROL SULFATE HFA 108 (90 BASE) MCG/ACT IN AERS: 2.0000 | INHALATION_SPRAY | Freq: Four times a day (QID) | RESPIRATORY_TRACT | Status: DC | PRN

## 2013-02-17 NOTE — Patient Instructions (Signed)
Saline nasal spray - flush nose at least 2-3x/day.  Flonase nasal spray - 2 sprays each nostril one time per day (do in the evening).  Robitussin DM.

## 2013-02-17 NOTE — Progress Notes (Signed)
Pre-visit discussion using our clinic review tool. No additional management support is needed unless otherwise documented below in the visit note.  

## 2013-02-22 ENCOUNTER — Encounter: Payer: Self-pay | Admitting: Internal Medicine

## 2013-02-22 DIAGNOSIS — I499 Cardiac arrhythmia, unspecified: Secondary | ICD-10-CM | POA: Insufficient documentation

## 2013-02-22 NOTE — Progress Notes (Signed)
  Subjective:    Patient ID: Kaitlin Dean, female    DOB: 12-Apr-1945, 68 y.o.   MRN: [id number]  URI   68 year old female with past history of hypertension and hypercholesterolemia who comes in today as a work in with concerns regarding increased cough and congestion.  State symptoms started five days ago.  Increased congestion.  Increased cough.  No sleeping well due to the cough.  Some hoarseness.  Increased fatigue.  No fever.  Some chills.  Increased drainage.  Cough productive.  Glands feel swollen.  No chest tightness.  No sob.     Past Medical History  Diagnosis Date  . Hypertension   . Hypercholesterolemia   . GERD (gastroesophageal reflux disease)   . 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 Skin cancer   . Barrett's esophagus   . Chicken pox   . Arthritis   . Complication of anesthesia     woke up 15 years ago and felt like could not breathe - no complications     Current Outpatient Prescriptions on File Prior to Visit  Medication Sig Dispense Refill  . buPROPion (WELLBUTRIN SR) 150 MG 12 hr tablet Take 1 tablet (150 mg total) by mouth every morning.  30 tablet  1  . ezetimibe-simvastatin (VYTORIN) 10-20 MG per tablet Take 1 tablet by mouth at bedtime.  30 tablet  1  . naproxen sodium (ANAPROX) 220 MG tablet Take 220 mg by mouth as needed.      Marland Kitchen omeprazole (PRILOSEC) 20 MG capsule Take 20 mg by mouth daily.      Marland Kitchen telmisartan (MICARDIS) 40 MG tablet Take 1 tablet (40 mg total) by mouth every morning.  30 tablet  5   No current facility-administered medications on file prior to visit.    Review of Systems Patient denies any headache, lightheadedness or dizziness.  No sinus pressure.  Increased drainage.  No chest pain, tightness or palpitations.  No increased shortness of breath.  Does report the increased cough and congestion.   No nausea or vomiting.  No acid reflux.  No bowel change, such as diarrhea.       Objective:   Physical Exam  Filed Vitals:   02/17/13 1420  BP: 110/80  Pulse: 84  Temp: 98.2 F (65.22 C)   68 year old female in no acute distress.   HEENT:  Nares- clear.  Oropharynx - without lesions. NECK:  Supple.  Nontender.   HEART:  Appears to be regular. LUNGS:  No crackles or wheezing audible.  Respirations even and unlabored.          Assessment & Plan:  INCREASED PSYCHOSOCIAL STRESSORS.  Doing well on Wellbutrin.  Follow.    HEALTH MAINTENANCE.  Physical 02/04/12.  Colonoscopy 05/03/04 with diverticulosis and internal hemorrhoids.  Recommended follow up colonoscopy 04/2014.  Pap negative but HPV continued to be positive.  Referred to gyn.

## 2013-02-22 NOTE — Assessment & Plan Note (Addendum)
Symptoms and exam as outlined.  Treat with ceftin as outlined.  Mucinex DM in the am and Robitussin DM in the evening.  Flonase as directed.  Follow.  Notify me if symptoms change, worsen or do not resolve.

## 2013-02-27 ENCOUNTER — Ambulatory Visit: Payer: Self-pay | Admitting: Unknown Physician Specialty

## 2013-03-04 LAB — PATHOLOGY REPORT

## 2013-03-30 ENCOUNTER — Encounter: Payer: Self-pay | Admitting: Internal Medicine

## 2013-04-13 ENCOUNTER — Other Ambulatory Visit: Payer: Self-pay | Admitting: Internal Medicine

## 2013-04-16 ENCOUNTER — Ambulatory Visit: Payer: Medicare PPO | Admitting: Internal Medicine

## 2013-04-24 ENCOUNTER — Ambulatory Visit (INDEPENDENT_AMBULATORY_CARE_PROVIDER_SITE_OTHER): Payer: Medicare PPO | Admitting: Internal Medicine

## 2013-04-24 ENCOUNTER — Encounter: Payer: Self-pay | Admitting: Internal Medicine

## 2013-04-24 VITALS — BP 110/70 | HR 52 | Temp 98.3°F | Ht 63.5 in | Wt 196.2 lb

## 2013-04-24 DIAGNOSIS — K2270 Barrett's esophagus without dysplasia: Secondary | ICD-10-CM

## 2013-04-24 DIAGNOSIS — R6889 Other general symptoms and signs: Secondary | ICD-10-CM

## 2013-04-24 DIAGNOSIS — I499 Cardiac arrhythmia, unspecified: Secondary | ICD-10-CM

## 2013-04-24 DIAGNOSIS — B977 Papillomavirus as the cause of diseases classified elsewhere: Secondary | ICD-10-CM

## 2013-04-24 DIAGNOSIS — E7800 Pure hypercholesterolemia, unspecified: Secondary | ICD-10-CM

## 2013-04-24 DIAGNOSIS — M1710 Unilateral primary osteoarthritis, unspecified knee: Secondary | ICD-10-CM

## 2013-04-24 DIAGNOSIS — I10 Essential (primary) hypertension: Secondary | ICD-10-CM

## 2013-04-24 DIAGNOSIS — IMO0002 Reserved for concepts with insufficient information to code with codable children: Secondary | ICD-10-CM

## 2013-04-24 DIAGNOSIS — K219 Gastro-esophageal reflux disease without esophagitis: Secondary | ICD-10-CM

## 2013-04-24 DIAGNOSIS — M179 Osteoarthritis of knee, unspecified: Secondary | ICD-10-CM

## 2013-04-24 NOTE — Assessment & Plan Note (Addendum)
Blood pressure under good control.  Same medication regimen.  Follow metabolic panel.   

## 2013-04-24 NOTE — Progress Notes (Signed)
Subjective:    Patient ID: Kaitlin Dean, female    DOB: December 13, 1945, 68 y.o.   MRN: [id number]  HPI 68 year old female with past history of hypertension and hypercholesterolemia who comes in today for a scheduled follow up.  Previously had positive HPV.  Referred to gyn.  Worked up.  She states she is doing well. Is s/p knee surgery.  Doing well.   Diagnosed with a pelvic fracture.  Saw Reche Dixon.  Pain resolved.  Doing well.  Getting around better.   Denies any chest pain or tightness.  No sob.  No nausea or vomiting.  No bowel change.  No increased acid reflux.  Omeprazole controls.  Planning a trip to [place] soon.  Overall feels good.     Past Medical History  Diagnosis Date  . Hypertension   . Hypercholesterolemia   . GERD (gastroesophageal reflux disease)   . 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 Skin cancer   . Barrett's esophagus   . Chicken pox   . Arthritis   . Complication of anesthesia     woke up 15 years ago and felt like could not breathe - no complications     Current Outpatient Prescriptions on File Prior to Visit  Medication Sig Dispense Refill  . albuterol (PROVENTIL HFA;VENTOLIN HFA) 108 (90 BASE) MCG/ACT inhaler Inhale 2 puffs into the lungs every 6 (six) hours as needed for wheezing or shortness of breath.  1 Inhaler  0  . buPROPion (WELLBUTRIN SR) 150 MG 12 hr tablet TAKE ONE TABLET BY MOUTH EVERY MORNING  30 tablet  2  . naproxen sodium (ANAPROX) 220 MG tablet Take 220 mg by mouth as needed.      Marland Kitchen omeprazole (PRILOSEC) 20 MG capsule Take 20 mg by mouth daily.      Marland Kitchen telmisartan (MICARDIS) 40 MG tablet Take 1 tablet (40 mg total) by mouth every morning.  30 tablet  5  . VYTORIN 10-20 MG per tablet TAKE ONE TABLET BY MOUTH AT BEDTIME  30 tablet  5   No current facility-administered medications on file prior to visit.    Review of Systems Patient denies any headache, lightheadedness or dizziness.  No sinus or allergy symptoms.   No chest pain,  tightness or palpitations.  No increased shortness of breath, cough or congestion.  No nausea or vomiting.  No acid reflux.  No abdominal pain or cramping.  No bowel change, such as diarrhea, constipation, BRBPR or melana.  No urine change.  Doing better walking and ambulating.  Knee doing well.        Objective:   Physical Exam  Filed Vitals:   04/24/13 1520  BP: 110/70  Pulse: 52  Temp: 98.3 F (62.76 C)   68 year old female in no acute distress.   HEENT:  Nares- clear.  Oropharynx - without lesions. NECK:  Supple.  Nontender.  No audible bruit.  HEART:  Appears to be regular with premature beats.   LUNGS:  No crackles or wheezing audible.  Respirations even and unlabored.  RADIAL PULSE:  Equal bilaterally.   ABDOMEN:  Soft, nontender.  Bowel sounds present and normal.  No audible abdominal bruit.   EXTREMITIES:  No increased edema present.  DP pulses palpable and equal bilaterally.          Assessment & Plan:  MSK.  S/p knee surgery.  Has done well.  S/p pelvic fracture.  Doing well.  No pain.  Follow.  INCREASED PSYCHOSOCIAL STRESSORS.  Doing well on Wellbutrin.  Follow.    HEALTH MAINTENANCE.  Colonoscopy 05/03/04 with diverticulosis and internal hemorrhoids.  Recommended follow up colonoscopy 04/2014.  Pap negative but HPV is positive.  Referred to gyn.

## 2013-04-24 NOTE — Progress Notes (Signed)
Pre-visit discussion using our clinic review tool. No additional management support is needed unless otherwise documented below in the visit note.  

## 2013-04-26 ENCOUNTER — Encounter: Payer: Self-pay | Admitting: Internal Medicine

## 2013-04-26 NOTE — Assessment & Plan Note (Signed)
Reflux symptoms controlled.  Would avoid antiinflammatories.   

## 2013-04-26 NOTE — Assessment & Plan Note (Signed)
Referred to gyn

## 2013-04-26 NOTE — Assessment & Plan Note (Signed)
S/p knee surgery and doing well regarding her knee surgery.  Continues to follow up with ortho.

## 2013-04-26 NOTE — Assessment & Plan Note (Signed)
On vytorin.  Follow lipid panel and liver function.   

## 2013-04-26 NOTE — Assessment & Plan Note (Addendum)
EGD 11/11.  Currently doing well.  Had f/u EGD 1/15.  See report.  Symptoms controlled.  Follow.    

## 2013-04-26 NOTE — Assessment & Plan Note (Signed)
EKG obtained and revealed SR with PACs.  Ventricular rate low 80s.  Follow.  Currently asymptomatic.

## 2013-04-30 ENCOUNTER — Other Ambulatory Visit (INDEPENDENT_AMBULATORY_CARE_PROVIDER_SITE_OTHER): Payer: Medicare PPO

## 2013-04-30 DIAGNOSIS — I10 Essential (primary) hypertension: Secondary | ICD-10-CM

## 2013-04-30 DIAGNOSIS — E7800 Pure hypercholesterolemia, unspecified: Secondary | ICD-10-CM

## 2013-04-30 DIAGNOSIS — K219 Gastro-esophageal reflux disease without esophagitis: Secondary | ICD-10-CM

## 2013-04-30 DIAGNOSIS — I499 Cardiac arrhythmia, unspecified: Secondary | ICD-10-CM

## 2013-04-30 LAB — BASIC METABOLIC PANEL
BUN: 14 mg/dL (ref 6–23)
CO2: 26 mEq/L (ref 19–32)
Calcium: 8.9 mg/dL (ref 8.4–10.5)
Chloride: 104 mEq/L (ref 96–112)
Creatinine, Ser: 1 mg/dL (ref 0.4–1.2)
GFR: 56.93 mL/min — ABNORMAL LOW (ref >60.00)
Glucose, Bld: 120 mg/dL — ABNORMAL HIGH (ref 70–99)
POTASSIUM: 3.8 meq/L (ref 3.5–5.1)
SODIUM: 138 meq/L (ref 135–145)

## 2013-04-30 LAB — CBC WITH DIFFERENTIAL/PLATELET
Basophils Absolute: 0 10*3/uL (ref 0.0–0.1)
Basophils Relative: 0.6 % (ref 0.0–3.0)
EOS PCT: 3.8 % (ref 0.0–5.0)
Eosinophils Absolute: 0.2 10*3/uL (ref 0.0–0.7)
HEMATOCRIT: 41.8 % (ref 36.0–46.0)
Hemoglobin: 14.3 g/dL (ref 12.0–15.0)
LYMPHS ABS: 1.4 10*3/uL (ref 0.7–4.0)
Lymphocytes Relative: 32.3 % (ref 12.0–46.0)
MCHC: 34.1 g/dL (ref 30.0–36.0)
MCV: 89.9 fl (ref 78.0–100.0)
MONOS PCT: 6.4 % (ref 3.0–12.0)
Monocytes Absolute: 0.3 10*3/uL (ref 0.1–1.0)
Neutro Abs: 2.5 10*3/uL (ref 1.4–7.7)
Neutrophils Relative %: 56.9 % (ref 43.0–77.0)
Platelets: 236 10*3/uL (ref 150.0–400.0)
RBC: 4.65 Mil/uL (ref 3.87–5.11)
RDW: 13.4 % (ref 11.5–14.6)
WBC: 4.4 10*3/uL — AB (ref 4.5–10.5)

## 2013-04-30 LAB — LIPID PANEL
Cholesterol: 168 mg/dL (ref 0–200)
HDL: 44.1 mg/dL (ref >39.00)
LDL CALC: 101 mg/dL — AB (ref 0–99)
TRIGLYCERIDES: 117 mg/dL (ref 0.0–149.0)
Total CHOL/HDL Ratio: 4
VLDL: 23.4 mg/dL (ref 0.0–40.0)

## 2013-04-30 LAB — HEPATIC FUNCTION PANEL
ALBUMIN: 4 g/dL (ref 3.5–5.2)
ALT: 26 U/L (ref 0–35)
AST: 27 U/L (ref 0–37)
Alkaline Phosphatase: 102 U/L (ref 39–117)
BILIRUBIN DIRECT: 0.1 mg/dL (ref 0.0–0.3)
Total Bilirubin: 1 mg/dL (ref 0.3–1.2)
Total Protein: 6.6 g/dL (ref 6.0–8.3)

## 2013-04-30 LAB — TSH: TSH: 3.55 u[IU]/mL (ref 0.35–5.50)

## 2013-05-01 ENCOUNTER — Telehealth: Payer: Self-pay | Admitting: Internal Medicine

## 2013-05-01 ENCOUNTER — Encounter: Payer: Self-pay | Admitting: Internal Medicine

## 2013-05-01 DIAGNOSIS — R739 Hyperglycemia, unspecified: Secondary | ICD-10-CM

## 2013-05-01 NOTE — Telephone Encounter (Signed)
Pt notified of lab results via my chart.  Needs a fasting lab appt in 3-4 weeks.  Please notify pt of a lab appt date and time.  Thanks.    Dr Nicki Reaper

## 2013-05-04 NOTE — Telephone Encounter (Signed)
Appointment 4/27.  Sent pt my chart message letting her know about appointment date and time

## 2013-05-19 ENCOUNTER — Encounter: Payer: Self-pay | Admitting: Internal Medicine

## 2013-05-25 ENCOUNTER — Other Ambulatory Visit (INDEPENDENT_AMBULATORY_CARE_PROVIDER_SITE_OTHER): Payer: Medicare PPO

## 2013-05-25 ENCOUNTER — Encounter: Payer: Self-pay | Admitting: Internal Medicine

## 2013-05-25 ENCOUNTER — Other Ambulatory Visit: Payer: Medicare PPO

## 2013-05-25 DIAGNOSIS — R739 Hyperglycemia, unspecified: Secondary | ICD-10-CM

## 2013-05-25 DIAGNOSIS — R7309 Other abnormal glucose: Secondary | ICD-10-CM

## 2013-05-25 LAB — BASIC METABOLIC PANEL
BUN: 14 mg/dL (ref 6–23)
CO2: 23 meq/L (ref 19–32)
Calcium: 9.1 mg/dL (ref 8.4–10.5)
Chloride: 108 mEq/L (ref 96–112)
Creatinine, Ser: 1 mg/dL (ref 0.4–1.2)
GFR: 61.05 mL/min (ref >60.00)
Glucose, Bld: 115 mg/dL — ABNORMAL HIGH (ref 70–99)
POTASSIUM: 3.9 meq/L (ref 3.5–5.1)
SODIUM: 139 meq/L (ref 135–145)

## 2013-05-25 LAB — HEMOGLOBIN A1C: Hgb A1c MFr Bld: 5.6 % (ref 4.6–6.5)

## 2013-06-29 ENCOUNTER — Other Ambulatory Visit: Payer: Self-pay | Admitting: Internal Medicine

## 2013-07-08 ENCOUNTER — Other Ambulatory Visit: Payer: Self-pay | Admitting: Internal Medicine

## 2013-08-27 ENCOUNTER — Ambulatory Visit (INDEPENDENT_AMBULATORY_CARE_PROVIDER_SITE_OTHER): Payer: Medicare PPO | Admitting: Internal Medicine

## 2013-08-27 ENCOUNTER — Encounter: Payer: Self-pay | Admitting: Internal Medicine

## 2013-08-27 VITALS — BP 122/80 | HR 69 | Temp 98.1°F | Ht 63.0 in | Wt 194.2 lb

## 2013-08-27 DIAGNOSIS — B977 Papillomavirus as the cause of diseases classified elsewhere: Secondary | ICD-10-CM

## 2013-08-27 DIAGNOSIS — Z1211 Encounter for screening for malignant neoplasm of colon: Secondary | ICD-10-CM

## 2013-08-27 DIAGNOSIS — Z1239 Encounter for other screening for malignant neoplasm of breast: Secondary | ICD-10-CM

## 2013-08-27 DIAGNOSIS — IMO0002 Reserved for concepts with insufficient information to code with codable children: Secondary | ICD-10-CM

## 2013-08-27 DIAGNOSIS — K2270 Barrett's esophagus without dysplasia: Secondary | ICD-10-CM

## 2013-08-27 DIAGNOSIS — K219 Gastro-esophageal reflux disease without esophagitis: Secondary | ICD-10-CM

## 2013-08-27 DIAGNOSIS — E7800 Pure hypercholesterolemia, unspecified: Secondary | ICD-10-CM

## 2013-08-27 DIAGNOSIS — R6889 Other general symptoms and signs: Secondary | ICD-10-CM

## 2013-08-27 DIAGNOSIS — I10 Essential (primary) hypertension: Secondary | ICD-10-CM

## 2013-08-27 DIAGNOSIS — Z23 Encounter for immunization: Secondary | ICD-10-CM

## 2013-08-27 NOTE — Progress Notes (Signed)
Pre visit review using our clinic review tool, if applicable. No additional management support is needed unless otherwise documented below in the visit note. 

## 2013-08-30 ENCOUNTER — Encounter: Payer: Self-pay | Admitting: Internal Medicine

## 2013-08-30 NOTE — Assessment & Plan Note (Signed)
Referred to gyn.  Saw Dr Benjie Karvonen.  See her note.  Need to confirm when due f/u pap.

## 2013-08-30 NOTE — Assessment & Plan Note (Signed)
Blood pressure under good control.  Same medication regimen.  Follow metabolic panel.   

## 2013-08-30 NOTE — Assessment & Plan Note (Signed)
Reflux symptoms controlled.  Would avoid antiinflammatories.

## 2013-08-30 NOTE — Assessment & Plan Note (Signed)
EGD 11/11.  Currently doing well.  Had f/u EGD 1/15.  See report.  Symptoms controlled.  Follow.

## 2013-08-30 NOTE — Progress Notes (Signed)
Subjective:    Patient ID: Kaitlin Dean, female    DOB: 1945/10/16, 68 y.o.   MRN: [id number]  HPI 68 year old female with past history of hypertension and hypercholesterolemia who comes in today to follow up on these issues as well as for a complete physical exam.   Previously had positive HPV.  Referred to gyn.  Worked up.  She states she is doing well. Is s/p knee surgery.  Doing well.   Diagnosed with a pelvic fracture.  Saw Reche Dixon.  Pain resolved.  Doing well.  Getting around better.   Denies any chest pain or tightness.  No sob.  No nausea or vomiting.  No bowel change now.  Had some diarrhea before her trip to [place].  This has resolved.   No increased acid reflux.  Omeprazole controls.  Overall feels good.     Past Medical History  Diagnosis Date  . Hypertension   . Hypercholesterolemia   . GERD (gastroesophageal reflux disease)   . 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 Skin cancer   . Barrett's esophagus   . Chicken pox   . Arthritis   . Complication of anesthesia     woke up 15 years ago and felt like could not breathe - no complications     Current Outpatient Prescriptions on File Prior to Visit  Medication Sig Dispense Refill  . albuterol (PROVENTIL HFA;VENTOLIN HFA) 108 (90 BASE) MCG/ACT inhaler Inhale 2 puffs into the lungs every 6 (six) hours as needed for wheezing or shortness of breath.  1 Inhaler  0  . buPROPion (WELLBUTRIN SR) 150 MG 12 hr tablet TAKE ONE TABLET BY MOUTH EVERY MORNING  30 tablet  2  . omeprazole (PRILOSEC) 20 MG capsule Take 20 mg by mouth daily.      Marland Kitchen telmisartan (MICARDIS) 40 MG tablet TAKE ONE TABLET BY MOUTH EVERY MORNING  30 tablet  5  . VYTORIN 10-20 MG per tablet TAKE ONE TABLET BY MOUTH AT BEDTIME  30 tablet  5   No current facility-administered medications on file prior to visit.    Review of Systems Patient denies any headache, lightheadedness or dizziness.  No sinus or allergy symptoms.   No chest pain, tightness or  palpitations.  No increased shortness of breath, cough or congestion.  No nausea or vomiting.  No acid reflux.  No abdominal pain or cramping.  No bowel change now,  such as diarrhea, constipation, BRBPR or melana.  No urine change.  Doing better walking and ambulating.  Knee doing well.   Overall feels good.       Objective:   Physical Exam  Filed Vitals:   08/27/13 1038  BP: 122/80  Pulse: 69  Temp: 98.1 F (36.7 C)   Blood pressure recheck:  71/41  68 year old female in no acute distress.   HEENT:  Nares- clear.  Oropharynx - without lesions. NECK:  Supple.  Nontender.  No audible bruit.  HEART:  Appears to be regular. LUNGS:  No crackles or wheezing audible.  Respirations even and unlabored.  RADIAL PULSE:  Equal bilaterally.    BREASTS:  No nipple discharge or nipple retraction present.  Could not appreciate any distinct nodules or axillary adenopathy.  ABDOMEN:  Soft, nontender.  Bowel sounds present and normal.  No audible abdominal bruit.  GU:  Not performed.     EXTREMITIES:  No increased edema present.  DP pulses palpable and equal bilaterally.  Assessment & Plan:  MSK.  S/p knee surgery.  Has done well.  S/p pelvic fracture.  Doing well.  No pain.  Follow.   INCREASED PSYCHOSOCIAL STRESSORS.  Doing well on Wellbutrin.  Follow.    HEALTH MAINTENANCE.  Colonoscopy 05/03/04 with diverticulosis and internal hemorrhoids.  Recommended follow up colonoscopy 04/2014.  Pap negative but HPV is positive.  Referred to gyn.  States checked out ok.  Need to confirm with them when f/u pap.    I spent 25 minutes with the patient and more than 50% of the time was spent in consultation regarding the above.

## 2013-08-30 NOTE — Assessment & Plan Note (Signed)
On vytorin.  Follow lipid panel and liver function.

## 2013-09-09 ENCOUNTER — Encounter: Payer: Self-pay | Admitting: Internal Medicine

## 2013-09-09 DIAGNOSIS — IMO0002 Reserved for concepts with insufficient information to code with codable children: Secondary | ICD-10-CM

## 2013-09-10 ENCOUNTER — Other Ambulatory Visit (INDEPENDENT_AMBULATORY_CARE_PROVIDER_SITE_OTHER): Payer: Medicare PPO

## 2013-09-10 DIAGNOSIS — Z1211 Encounter for screening for malignant neoplasm of colon: Secondary | ICD-10-CM

## 2013-09-10 LAB — FECAL OCCULT BLOOD, IMMUNOCHEMICAL: Fecal Occult Bld: POSITIVE — AB

## 2013-09-11 ENCOUNTER — Encounter: Payer: Self-pay | Admitting: *Deleted

## 2013-09-18 ENCOUNTER — Other Ambulatory Visit (INDEPENDENT_AMBULATORY_CARE_PROVIDER_SITE_OTHER): Payer: Medicare PPO

## 2013-09-18 ENCOUNTER — Telehealth: Payer: Self-pay | Admitting: Internal Medicine

## 2013-09-18 DIAGNOSIS — E7800 Pure hypercholesterolemia, unspecified: Secondary | ICD-10-CM

## 2013-09-18 DIAGNOSIS — I10 Essential (primary) hypertension: Secondary | ICD-10-CM

## 2013-09-18 DIAGNOSIS — K2270 Barrett's esophagus without dysplasia: Secondary | ICD-10-CM

## 2013-09-18 DIAGNOSIS — R195 Other fecal abnormalities: Secondary | ICD-10-CM

## 2013-09-18 LAB — CBC WITH DIFFERENTIAL/PLATELET
BASOS ABS: 0 10*3/uL (ref 0.0–0.1)
BASOS PCT: 0.4 % (ref 0.0–3.0)
EOS ABS: 0.3 10*3/uL (ref 0.0–0.7)
Eosinophils Relative: 6.4 % — ABNORMAL HIGH (ref 0.0–5.0)
HCT: 44.9 % (ref 36.0–46.0)
Hemoglobin: 15.1 g/dL — ABNORMAL HIGH (ref 12.0–15.0)
LYMPHS PCT: 35.1 % (ref 12.0–46.0)
Lymphs Abs: 1.8 10*3/uL (ref 0.7–4.0)
MCHC: 33.6 g/dL (ref 30.0–36.0)
MCV: 90.6 fl (ref 78.0–100.0)
MONO ABS: 0.3 10*3/uL (ref 0.1–1.0)
Monocytes Relative: 6.2 % (ref 3.0–12.0)
NEUTROS PCT: 51.9 % (ref 43.0–77.0)
Neutro Abs: 2.6 10*3/uL (ref 1.4–7.7)
Platelets: 262 10*3/uL (ref 150.0–400.0)
RBC: 4.95 Mil/uL (ref 3.87–5.11)
RDW: 13.6 % (ref 11.5–15.5)
WBC: 5 10*3/uL (ref 4.0–10.5)

## 2013-09-18 LAB — COMPREHENSIVE METABOLIC PANEL
ALK PHOS: 97 U/L (ref 39–117)
ALT: 30 U/L (ref 0–35)
AST: 29 U/L (ref 0–37)
Albumin: 4.3 g/dL (ref 3.5–5.2)
BUN: 20 mg/dL (ref 6–23)
CALCIUM: 9.3 mg/dL (ref 8.4–10.5)
CHLORIDE: 106 meq/L (ref 96–112)
CO2: 26 mEq/L (ref 19–32)
Creatinine, Ser: 1 mg/dL (ref 0.4–1.2)
GFR: 58.86 mL/min — ABNORMAL LOW (ref >60.00)
Glucose, Bld: 113 mg/dL — ABNORMAL HIGH (ref 70–99)
Potassium: 4 mEq/L (ref 3.5–5.1)
Sodium: 139 mEq/L (ref 135–145)
Total Bilirubin: 1.1 mg/dL (ref 0.2–1.2)
Total Protein: 7 g/dL (ref 6.0–8.3)

## 2013-09-18 LAB — LIPID PANEL
CHOL/HDL RATIO: 4
Cholesterol: 168 mg/dL (ref 0–200)
HDL: 41.3 mg/dL (ref >39.00)
LDL CALC: 93 mg/dL (ref 0–99)
NONHDL: 126.7
TRIGLYCERIDES: 167 mg/dL — AB (ref 0.0–149.0)
VLDL: 33.4 mg/dL (ref 0.0–40.0)

## 2013-09-18 NOTE — Telephone Encounter (Signed)
Order placed for referral to GI 

## 2013-09-21 ENCOUNTER — Encounter: Payer: Self-pay | Admitting: *Deleted

## 2013-09-24 ENCOUNTER — Encounter: Payer: Self-pay | Admitting: Internal Medicine

## 2013-09-24 ENCOUNTER — Ambulatory Visit (INDEPENDENT_AMBULATORY_CARE_PROVIDER_SITE_OTHER): Payer: Medicare PPO | Admitting: Internal Medicine

## 2013-09-24 ENCOUNTER — Ambulatory Visit: Payer: Self-pay | Admitting: Internal Medicine

## 2013-09-24 VITALS — BP 116/78 | HR 85 | Temp 98.5°F | Wt 192.0 lb

## 2013-09-24 DIAGNOSIS — R1032 Left lower quadrant pain: Secondary | ICD-10-CM

## 2013-09-24 LAB — POCT URINALYSIS DIPSTICK
BILIRUBIN UA: NEGATIVE
GLUCOSE UA: NEGATIVE
Ketones, UA: NEGATIVE
LEUKOCYTES UA: NEGATIVE
NITRITE UA: NEGATIVE
RBC UA: NEGATIVE
Spec Grav, UA: >=1.03
UROBILINOGEN UA: NEGATIVE
pH, UA: 5.5

## 2013-09-24 MED ORDER — CIPROFLOXACIN HCL 500 MG PO TABS: 500.0000 mg | ORAL_TABLET | Freq: Two times a day (BID) | ORAL | Status: DC

## 2013-09-24 MED ORDER — METRONIDAZOLE 500 MG PO TABS
500.0000 mg | ORAL_TABLET | Freq: Three times a day (TID) | ORAL | Status: DC
Start: 2013-09-24 — End: 2013-11-12

## 2013-09-24 NOTE — Progress Notes (Signed)
Pre visit review using our clinic review tool, if applicable. No additional management support is needed unless otherwise documented below in the visit note. 

## 2013-09-24 NOTE — Patient Instructions (Addendum)

## 2013-09-24 NOTE — Progress Notes (Signed)
Subjective:    Patient ID: Kaitlin Dean, female    DOB: 12-05-1945, 68 y.o.   MRN: [id number]  HPI  Pt presents to the clinic today with c/o abdominal pain. Pt reports this started this morning. It is in the LLQ. It did start while she was outside pruning her bushes. It seems worse with movement. She denies pain when she is laying down. She denies nausea, vomiting or diarrhea. She denies urinary symptoms. She is have normal bowel movements. She has not tried anything OTC. She does have a history of barretts esophagus, reflux and diverticulosis. She is on prilosec. She is due for screening colonoscopy 04/2014 and has already been referred to GI by Dr. Nicki Reaper.  Review of Systems      Past Medical History  Diagnosis Date  . Hypertension   . Hypercholesterolemia   . GERD (gastroesophageal reflux disease)   . 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 Skin cancer   . Barrett's esophagus   . Chicken pox   . Arthritis   . Complication of anesthesia     woke up 15 years ago and felt like could not breathe - no complications     Current Outpatient Prescriptions  Medication Sig Dispense Refill  . albuterol (PROVENTIL HFA;VENTOLIN HFA) 108 (90 BASE) MCG/ACT inhaler Inhale 2 puffs into the lungs every 6 (six) hours as needed for wheezing or shortness of breath.  1 Inhaler  0  . buPROPion (WELLBUTRIN SR) 150 MG 12 hr tablet TAKE ONE TABLET BY MOUTH EVERY MORNING  30 tablet  2  . omeprazole (PRILOSEC) 20 MG capsule Take 20 mg by mouth daily.      Marland Kitchen telmisartan (MICARDIS) 40 MG tablet TAKE ONE TABLET BY MOUTH EVERY MORNING  30 tablet  5  . VYTORIN 10-20 MG per tablet TAKE ONE TABLET BY MOUTH AT BEDTIME  30 tablet  5   No current facility-administered medications for this visit.    No Known Allergies  Family History  Problem Relation Age of Onset  . Hypertension Mother   . Congestive Heart Failure Mother   . Stroke Father   . Stroke Brother   . Heart failure Brother   .  Sarcoidosis Sister     died of kidney failure  . Breast cancer Neg Hx   . Colon cancer Neg Hx     History   Social History  . Marital Status: Widowed    Spouse Name: N/A    Number of Children: 2  . Years of Education: N/A   Occupational History  . Not on file.   Social History Main Topics  . Smoking status: Never Smoker   . Smokeless tobacco: Never Used  . Alcohol Use: Yes     Comment: occasional  . Drug Use: No  . Sexual Activity: Not on file   Other Topics Concern  . Not on file   Social History Narrative  . No narrative on file     Constitutional: Denies fever, malaise, fatigue, headache or abrupt weight changes.  Respiratory: Denies difficulty breathing, shortness of breath, cough or sputum production.   Cardiovascular: Denies chest pain, chest tightness, palpitations or swelling in the hands or feet.  Gastrointestinal: Pt reports abdominal pain. Denies bloating, constipation, diarrhea or blood in the stool.  GU: Denies urgency, frequency, pain with urination, burning sensation, blood in urine, odor or discharge. Musculoskeletal: Denies decrease in range of motion, difficulty with gait, muscle pain or joint pain and swelling.  No other specific complaints in a complete review of systems (except as listed in HPI above).  Objective:   Physical Exam   BP 116/78  Pulse 85  Temp(Src) 98.5 F (36.9 C) (Oral)  Wt 192 lb (87.091 kg)  SpO2 98%  LMP 01/29/1993 Wt Readings from Last 3 Encounters:  09/24/13 192 lb (87.091 kg)  08/27/13 194 lb 4 oz (88.111 kg)  04/24/13 196 lb 4 oz (89.018 kg)    General: Appears her stated age, well developed, well nourished in NAD. Cardiovascular: Normal rate and rhythm. S1,S2 noted.  No murmur, rubs or gallops noted. No JVD or BLE edema. No carotid bruits noted. Pulmonary/Chest: Normal effort and positive vesicular breath sounds. No respiratory distress. No wheezes, rales or ronchi noted.  Abdomen: Soft and tender in the LUQ  and LLQ. Normal bowel sounds, no bruits noted. No distention or masses noted. Liver, spleen and kidneys non palpable. Musculoskeletal: No pain with rotation of the spine. Some mild pain with flexion.   BMET    Component Value Date/Time   NA 139 09/18/2013 0827   K 4.0 09/18/2013 0827   CL 106 09/18/2013 0827   CO2 26 09/18/2013 0827   GLUCOSE 113* 09/18/2013 0827   BUN 20 09/18/2013 0827   CREATININE 1.0 09/18/2013 0827   CALCIUM 9.3 09/18/2013 0827   GFRNONAA 86* 05/02/2012 0406   GFRAA >90 05/02/2012 0406    Lipid Panel     Component Value Date/Time   CHOL 168 09/18/2013 0827   TRIG 167.0* 09/18/2013 0827   HDL 41.30 09/18/2013 0827   CHOLHDL 4 09/18/2013 0827   VLDL 33.4 09/18/2013 0827   LDLCALC 93 09/18/2013 0827    CBC    Component Value Date/Time   WBC 5.0 09/18/2013 0827   RBC 4.95 09/18/2013 0827   HGB 15.1* 09/18/2013 0827   HCT 44.9 09/18/2013 0827   PLT 262.0 09/18/2013 0827   MCV 90.6 09/18/2013 0827   MCH 32.0 05/03/2012 0430   MCHC 33.6 09/18/2013 0827   RDW 13.6 09/18/2013 0827   LYMPHSABS 1.8 09/18/2013 0827   MONOABS 0.3 09/18/2013 0827   EOSABS 0.3 09/18/2013 0827   BASOSABS 0.0 09/18/2013 0827    Hgb A1C Lab Results  Component Value Date   HGBA1C 5.6 05/25/2013        Assessment & Plan:   Abdominal pain, LLQ, LUQ:  Urinalysis: normal Will obtain stat CT abd r/o diverticulitis No meds at this time until we can figure out what is going on  If pain worsens, go straight to ER

## 2013-09-24 NOTE — Addendum Note (Signed)
Addended by: Jearld Fenton on: 09/24/2013 07:41 PM   Modules accepted: Orders

## 2013-09-25 ENCOUNTER — Encounter: Payer: Self-pay | Admitting: Internal Medicine

## 2013-09-28 ENCOUNTER — Telehealth: Payer: Self-pay | Admitting: *Deleted

## 2013-09-28 NOTE — Telephone Encounter (Signed)
[facility] Triage Call Report Triage Record Num: [id number] Operator: Abe People Patient Name: Kaitlin Dean Call Date & Time: 09/26/2013 2:02:54YH Patient Phone: [phone] PCP: Webb Silversmith Patient Gender: Female PCP Fax : Patient DOB: Feb 09, 1945 Practice Name[facility] Reason for Call: Caller: [name]/Patient; PCP: Webb Silversmith; CB#: [phone]; Call regarding Nausea and vomiting with Flagyl 500mg  TID started 8/285/15 for Diverticulitis. She has taken Cipro before with no problems. She takes the Flagyl with food about is nauseated x 2 hrs and then vomits. Triaged Allrgergic Reaction, Severe and call provider within 4 hrs for first occurance of mild allergic reaction that occures within several minutes to several hours after exposure to an allergen and without any other SS of anyphylaxis. Paged Dr. Sarajane Jews and he stated to stop the Flagyl and just see how the Cipro does. Pt voices understanding. Call back instructions given. Protocol(s) Used: Allergic Reaction, Severe Recommended Outcome per Protocol: Call Provider within 4 Hours Reason for Outcome: First occurrence of mild allergic reaction (rash; hives; itching; nasal congestion; watery, red eyes) that occurs within minutes to several hours after exposure to an allergen AND without any other signs or symptoms of anaphylaxis. Care Advice: ~

## 2013-10-01 ENCOUNTER — Other Ambulatory Visit: Payer: Self-pay | Admitting: Internal Medicine

## 2013-10-01 ENCOUNTER — Telehealth: Payer: Self-pay

## 2013-10-01 MED ORDER — CLINDAMYCIN HCL 150 MG PO CAPS: 150.0000 mg | ORAL_CAPSULE | Freq: Three times a day (TID) | ORAL | Status: DC

## 2013-10-01 NOTE — Telephone Encounter (Signed)
Pt is aware as instructed 

## 2013-10-01 NOTE — Telephone Encounter (Signed)
Yes, if not taking flagyl, she should be taking another abx. Will call in clindamycin

## 2013-10-01 NOTE — Telephone Encounter (Signed)
Pt was seen 09/24/13;pt was given Flagyl and Cipro. Last weekend pt spoke with oncall doctor; flagyl was causing N&V and pt was advised to stop Flagyl. Pt still has another week of Cipro to take and pt is feeling better; pt contacted Dr Vira Agar [facility] but was advised to contact prescribing provider to see if substitute med for Flagyl should be given. Pt request cb. [facility].

## 2013-10-07 ENCOUNTER — Other Ambulatory Visit: Payer: Self-pay | Admitting: Internal Medicine

## 2013-10-27 DIAGNOSIS — Z8719 Personal history of other diseases of the digestive system: Secondary | ICD-10-CM | POA: Insufficient documentation

## 2013-10-27 DIAGNOSIS — R195 Other fecal abnormalities: Secondary | ICD-10-CM | POA: Insufficient documentation

## 2013-11-12 ENCOUNTER — Encounter: Payer: Self-pay | Admitting: Internal Medicine

## 2013-11-12 ENCOUNTER — Ambulatory Visit (INDEPENDENT_AMBULATORY_CARE_PROVIDER_SITE_OTHER): Payer: Medicare PPO | Admitting: Internal Medicine

## 2013-11-12 VITALS — BP 120/80 | HR 88 | Temp 98.3°F | Ht 63.0 in | Wt 193.5 lb

## 2013-11-12 DIAGNOSIS — N644 Mastodynia: Secondary | ICD-10-CM | POA: Insufficient documentation

## 2013-11-12 DIAGNOSIS — Z23 Encounter for immunization: Secondary | ICD-10-CM

## 2013-11-12 DIAGNOSIS — N6459 Other signs and symptoms in breast: Secondary | ICD-10-CM | POA: Insufficient documentation

## 2013-11-12 NOTE — Progress Notes (Signed)
Pre visit review using our clinic review tool, if applicable. No additional management support is needed unless otherwise documented below in the visit note. 

## 2013-11-22 ENCOUNTER — Encounter: Payer: Self-pay | Admitting: Internal Medicine

## 2013-11-22 NOTE — Assessment & Plan Note (Signed)
Symptoms and exam as outlined.  Schedule diagnostic mammogram and right breast ultrasound.  Further w/up pending.

## 2013-11-22 NOTE — Assessment & Plan Note (Signed)
Exam as outlined.  Check diagnostic mammogram and ultrasound right breast.  Further w/up pending.  Decrease caffeine intake.

## 2013-11-22 NOTE — Progress Notes (Signed)
Subjective:    Patient ID: Kaitlin Dean, female    DOB: [birth year], 68 y.o.   MRN: [id number]  HPI 68 year old female with past history of hypertension and hypercholesterolemia who comes in today as a work in with concerns regarding some right breast pain. (question of knot).  First noticed two weeks ago.  No nipple discharge.  No left breast change.      Past Medical History  Diagnosis Date  . Hypertension   . Hypercholesterolemia   . GERD (gastroesophageal reflux disease)   . 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 Skin cancer   . Barrett's esophagus   . Chicken pox   . Arthritis   . Complication of anesthesia     woke up 15 years ago and felt like could not breathe - no complications     Current Outpatient Prescriptions on File Prior to Visit  Medication Sig Dispense Refill  . albuterol (PROVENTIL HFA;VENTOLIN HFA) 108 (90 BASE) MCG/ACT inhaler Inhale 2 puffs into the lungs every 6 (six) hours as needed for wheezing or shortness of breath.  1 Inhaler  0  . buPROPion (WELLBUTRIN SR) 150 MG 12 hr tablet TAKE ONE TABLET BY MOUTH EVERY MORNING  30 tablet  2  . omeprazole (PRILOSEC) 20 MG capsule Take 20 mg by mouth daily.      Marland Kitchen telmisartan (MICARDIS) 40 MG tablet TAKE ONE TABLET BY MOUTH EVERY MORNING  30 tablet  5  . VYTORIN 10-20 MG per tablet TAKE ONE TABLET BY MOUTH AT BEDTIME  30 tablet  2   No current facility-administered medications on file prior to visit.    Review of Systems No chest pain.  No increased shortness of breath, cough or congestion.  No nausea or vomiting.  Reports right breast tenderness as outlined.  Question of knot.  No nipple discharge.  No other problems reported.       Objective:   Physical Exam  Filed Vitals:   11/12/13 1519  BP: 120/80  Pulse: 88  Temp: 98.3 F (80.2 C)   68 year old female in no acute distress.  NECK:  Supple.  Nontender.  No audible bruit.  HEART:  Appears to be regular. LUNGS:  No crackles or wheezing  audible.  Respirations even and unlabored.   BREASTS:  No nipple discharge or nipple retraction present.  Could not appreciate any distinct nodules or axillary adenopathy.  Pain and fullness localized - 11:00 - 12:00 right breast.        Assessment & Plan:  HEALTH MAINTENANCE.  Colonoscopy 05/03/04 with diverticulosis and internal hemorrhoids.  Recommended follow up colonoscopy 04/2014.  Pap negative but HPV is positive.  Referred to gyn.  States checked out ok.  Need to confirm with them when f/u pap.    Problem List Items Addressed This Visit   Breast pain, right     Exam as outlined.  Check diagnostic mammogram and ultrasound right breast.  Further w/up pending.  Decrease caffeine intake.       Relevant Orders      MM Digital Diagnostic Bilat      US BREAST COMPLETE UNI RIGHT INC AXILLA   Breast thickening     Symptoms and exam as outlined.  Schedule diagnostic mammogram and right breast ultrasound.  Further w/up pending.       Relevant Orders      MM Digital Diagnostic Bilat      US BREAST COMPLETE UNI RIGHT INC AXILLA  Other Visit Diagnoses   Encounter for immunization    -  Primary

## 2013-11-23 ENCOUNTER — Ambulatory Visit: Payer: Self-pay | Admitting: Unknown Physician Specialty

## 2013-11-23 LAB — HM COLONOSCOPY

## 2013-12-04 ENCOUNTER — Ambulatory Visit: Payer: Self-pay | Admitting: Internal Medicine

## 2013-12-04 ENCOUNTER — Encounter: Payer: Self-pay | Admitting: Internal Medicine

## 2013-12-04 LAB — HM MAMMOGRAPHY

## 2013-12-17 ENCOUNTER — Telehealth: Payer: Self-pay | Admitting: Internal Medicine

## 2013-12-17 NOTE — Telephone Encounter (Signed)
Pt called to schedule an appt asap for stomach pain post colonoscopy. Please advise/msn

## 2013-12-17 NOTE — Telephone Encounter (Signed)
Need more information.  What specific symptoms is she having?  Any nausea or vomiting?  How severe pain?  Any bowel changes?  Did the pain start right after colonoscopy?  Has she talked to GI?

## 2013-12-17 NOTE — Telephone Encounter (Signed)
LMTCB

## 2013-12-22 NOTE — Telephone Encounter (Signed)
Pt never returned call

## 2014-01-01 ENCOUNTER — Other Ambulatory Visit: Payer: Self-pay | Admitting: Internal Medicine

## 2014-02-28 ENCOUNTER — Encounter: Payer: Self-pay | Admitting: Internal Medicine

## 2014-02-28 DIAGNOSIS — K5730 Diverticulosis of large intestine without perforation or abscess without bleeding: Secondary | ICD-10-CM

## 2014-02-28 HISTORY — DX: Diverticulosis of large intestine without perforation or abscess without bleeding: K57.30

## 2014-03-03 ENCOUNTER — Other Ambulatory Visit (HOSPITAL_COMMUNITY)
Admission: RE | Admit: 2014-03-03 | Discharge: 2014-03-03 | Disposition: A | Discharge status: Home / Self Care | Payer: Medicare PPO | Source: Ambulatory Visit | Attending: Internal Medicine | Admitting: Internal Medicine

## 2014-03-03 ENCOUNTER — Encounter: Payer: Self-pay | Admitting: Internal Medicine

## 2014-03-03 ENCOUNTER — Ambulatory Visit (INDEPENDENT_AMBULATORY_CARE_PROVIDER_SITE_OTHER): Payer: Medicare PPO | Admitting: Internal Medicine

## 2014-03-03 VITALS — BP 120/80 | HR 79 | Temp 98.7°F | Ht 63.0 in | Wt 196.5 lb

## 2014-03-03 DIAGNOSIS — Z1151 Encounter for screening for human papillomavirus (HPV): Secondary | ICD-10-CM | POA: Insufficient documentation

## 2014-03-03 DIAGNOSIS — K2270 Barrett's esophagus without dysplasia: Secondary | ICD-10-CM

## 2014-03-03 DIAGNOSIS — R87810 Cervical high risk human papillomavirus (HPV) DNA test positive: Secondary | ICD-10-CM | POA: Diagnosis present

## 2014-03-03 DIAGNOSIS — I10 Essential (primary) hypertension: Secondary | ICD-10-CM

## 2014-03-03 DIAGNOSIS — B977 Papillomavirus as the cause of diseases classified elsewhere: Secondary | ICD-10-CM

## 2014-03-03 DIAGNOSIS — Z01411 Encounter for gynecological examination (general) (routine) with abnormal findings: Secondary | ICD-10-CM | POA: Diagnosis present

## 2014-03-03 DIAGNOSIS — IMO0002 Reserved for concepts with insufficient information to code with codable children: Secondary | ICD-10-CM

## 2014-03-03 DIAGNOSIS — E669 Obesity, unspecified: Secondary | ICD-10-CM

## 2014-03-03 DIAGNOSIS — E7800 Pure hypercholesterolemia, unspecified: Secondary | ICD-10-CM

## 2014-03-03 DIAGNOSIS — Z124 Encounter for screening for malignant neoplasm of cervix: Secondary | ICD-10-CM

## 2014-03-03 DIAGNOSIS — R888 Abnormal findings in other body fluids and substances: Secondary | ICD-10-CM

## 2014-03-03 DIAGNOSIS — K5730 Diverticulosis of large intestine without perforation or abscess without bleeding: Secondary | ICD-10-CM

## 2014-03-03 NOTE — Progress Notes (Signed)
Pre visit review using our clinic review tool, if applicable. No additional management support is needed unless otherwise documented below in the visit note. 

## 2014-03-03 NOTE — Assessment & Plan Note (Signed)
F/u pap today 

## 2014-03-03 NOTE — Assessment & Plan Note (Signed)
Blood pressure is well controlled.  Continue current medication regimen.  Check metabolic panel.

## 2014-03-03 NOTE — Progress Notes (Signed)
Patient ID: Kaitlin Dean, female   DOB: 02/24/1945, 69 y.o.   MRN: [id number]   Subjective:    Patient ID: Kaitlin Dean, female    DOB: February 07, 1945, 69 y.o.   MRN: [id number]  HPI  Patient here for for a scheduled follow up.  Has a history of hypercholesterolemia and positive HPV.  On vytorin.  Here for follow up pap smear.  Saw gyn in 01/2013.  Check ok.  Recommended f/u pap in one year.  No vaginal problems except for vaginal dryness.  Recently had diverticulitis.  This has resolved.  Saw GI.  Had colonoscopy.  No abdominal pain now.  Bowels back to normal.  Eating and drinking well.  No acid reflux.  No breast pain or problems.     Past Medical History  Diagnosis Date  . Hypertension   . Hypercholesterolemia   . GERD (gastroesophageal reflux disease)   . 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 Skin cancer   . Barrett's esophagus   . Chicken pox   . Arthritis   . Complication of anesthesia     woke up 15 years ago and felt like could not breathe - no complications   . Diverticulosis of colon without hemorrhage 02/28/2014    Current Outpatient Prescriptions on File Prior to Visit  Medication Sig Dispense Refill  . albuterol (PROVENTIL HFA;VENTOLIN HFA) 108 (90 BASE) MCG/ACT inhaler Inhale 2 puffs into the lungs every 6 (six) hours as needed for wheezing or shortness of breath. 1 Inhaler 0  . buPROPion (WELLBUTRIN SR) 150 MG 12 hr tablet TAKE ONE TABLET BY MOUTH EVERY MORNING 30 tablet 2  . omeprazole (PRILOSEC) 20 MG capsule Take 20 mg by mouth daily.    Marland Kitchen telmisartan (MICARDIS) 40 MG tablet TAKE ONE TABLET BY MOUTH EVERY MORNING 30 tablet 5  . VYTORIN 10-20 MG per tablet TAKE ONE TABLET BY MOUTH AT BEDTIME 30 tablet 5   No current facility-administered medications on file prior to visit.    Review of Systems  Constitutional: Negative for appetite change, fatigue and unexpected weight change.  HENT: Negative for congestion and sinus pressure.   Respiratory: Negative  for cough, chest tightness and shortness of breath.   Cardiovascular: Negative for chest pain, palpitations and leg swelling.  Gastrointestinal: Negative for nausea, vomiting, abdominal pain, diarrhea and constipation.  Genitourinary: Negative for vaginal bleeding and vaginal discharge (some vaginal dryness).  Musculoskeletal: Negative for back pain and joint swelling.  Neurological: Negative for dizziness and light-headedness.       Objective:    Physical Exam  HENT:  Nose: Nose normal.  Mouth/Throat: Oropharynx is clear and moist.  Neck: Neck supple. No thyromegaly present.  Cardiovascular: Normal rate and regular rhythm.   Pulmonary/Chest: Breath sounds normal. No respiratory distress. She has no wheezes.  Abdominal: Soft. Bowel sounds are normal. There is no tenderness.  Genitourinary:  Normal external genitalia.  Vaginal vault without lesions.  Cervix identified.  Pap smear performed.  Could not appreciate any adnexal masses or tenderness.    Musculoskeletal: She exhibits no edema or tenderness.  Lymphadenopathy:    She has no cervical adenopathy.    BP 120/80 mmHg  Pulse 79  Temp(Src) 98.7 F (37.1 C) (Oral)  Ht 5\' 3"  (1.6 m)  Wt 196 lb 8 oz (89.132 kg)  BMI 34.82 kg/m2  SpO2 97%  LMP 01/29/1993 Wt Readings from Last 3 Encounters:  03/03/14 196 lb 8 oz (89.132 kg)  11/12/13 193 lb  8 oz (87.771 kg)  09/24/13 192 lb (87.091 kg)     Lab Results  Component Value Date   WBC 5.0 09/18/2013   HGB 15.1* 09/18/2013   HCT 44.9 09/18/2013   PLT 262.0 09/18/2013   GLUCOSE 113* 09/18/2013   CHOL 168 09/18/2013   TRIG 167.0* 09/18/2013   HDL 41.30 09/18/2013   LDLDIRECT 182.4 02/05/2012   LDLCALC 93 09/18/2013   ALT 30 09/18/2013   AST 29 09/18/2013   NA 139 09/18/2013   K 4.0 09/18/2013   CL 106 09/18/2013   CREATININE 1.0 09/18/2013   BUN 20 09/18/2013   CO2 26 09/18/2013   TSH 3.55 04/30/2013   INR 0.99 04/22/2012   HGBA1C 5.6 05/25/2013       Assessment  & Plan:   Problem List Items Addressed This Visit    Barrett's esophagus    Had f/u EGD 01/2013.  See report.  Symptoms controlled.  Follow.        Diverticulosis of colon without hemorrhage - Primary    Recent CT and colonoscopy as outlined in overview.  Recommended f/u colonoscopy in 10 years (due 10/2023).  Currently asymptomatic.  No abdominal pain.        HPV test positive    F/u pap today.        Hypercholesterolemia    On vytorin.  Check lipid panel and liver function tests.  Low cholesterol diet and exercise.        Relevant Orders   Lipid panel   Hypertension    Blood pressure is well controlled.  Continue current medication regimen.  Check metabolic panel.        Relevant Orders   Comprehensive metabolic panel   Obesity (BMI 30-39.9)    Diet and exercise.         Other Visit Diagnoses    Pap smear for cervical cancer screening        Relevant Orders    Cytology - PAP      I spent 25 minutes with the patient and more than 50% of the time was spent in consultation regarding the above.     Einar Pheasant, MD

## 2014-03-03 NOTE — Assessment & Plan Note (Addendum)
Recent CT and colonoscopy as outlined in overview.  Recommended f/u colonoscopy in 10 years (due 10/2023).  Currently asymptomatic.  No abdominal pain.

## 2014-03-06 ENCOUNTER — Encounter: Payer: Self-pay | Admitting: Internal Medicine

## 2014-03-06 DIAGNOSIS — Z6835 Body mass index (BMI) 35.0-35.9, adult: Secondary | ICD-10-CM | POA: Insufficient documentation

## 2014-03-06 NOTE — Assessment & Plan Note (Signed)
Had f/u EGD 01/2013.  See report.  Symptoms controlled.  Follow.

## 2014-03-06 NOTE — Assessment & Plan Note (Signed)
On vytorin.  Check lipid panel and liver function tests.  Low cholesterol diet and exercise.

## 2014-03-06 NOTE — Assessment & Plan Note (Signed)
Diet and exercise.   

## 2014-03-08 LAB — CYTOLOGY - PAP

## 2014-03-10 ENCOUNTER — Other Ambulatory Visit (INDEPENDENT_AMBULATORY_CARE_PROVIDER_SITE_OTHER): Payer: Medicare PPO

## 2014-03-10 ENCOUNTER — Other Ambulatory Visit: Payer: Self-pay | Admitting: Internal Medicine

## 2014-03-10 DIAGNOSIS — E7800 Pure hypercholesterolemia, unspecified: Secondary | ICD-10-CM

## 2014-03-10 DIAGNOSIS — R87610 Atypical squamous cells of undetermined significance on cytologic smear of cervix (ASC-US): Secondary | ICD-10-CM

## 2014-03-10 DIAGNOSIS — I10 Essential (primary) hypertension: Secondary | ICD-10-CM

## 2014-03-10 DIAGNOSIS — R739 Hyperglycemia, unspecified: Secondary | ICD-10-CM

## 2014-03-10 DIAGNOSIS — IMO0002 Reserved for concepts with insufficient information to code with codable children: Secondary | ICD-10-CM

## 2014-03-10 LAB — COMPREHENSIVE METABOLIC PANEL
ALBUMIN: 4.2 g/dL (ref 3.5–5.2)
ALK PHOS: 99 U/L (ref 39–117)
ALT: 27 U/L (ref 0–35)
AST: 26 U/L (ref 0–37)
BUN: 17 mg/dL (ref 6–23)
CALCIUM: 9.4 mg/dL (ref 8.4–10.5)
CHLORIDE: 106 meq/L (ref 96–112)
CO2: 25 mEq/L (ref 19–32)
Creatinine, Ser: 0.9 mg/dL (ref 0.40–1.20)
GFR: 65.62 mL/min (ref >60.00)
Glucose, Bld: 130 mg/dL — ABNORMAL HIGH (ref 70–99)
Potassium: 3.8 mEq/L (ref 3.5–5.1)
SODIUM: 138 meq/L (ref 135–145)
Total Bilirubin: 0.9 mg/dL (ref 0.2–1.2)
Total Protein: 6.8 g/dL (ref 6.0–8.3)

## 2014-03-10 LAB — LIPID PANEL
Cholesterol: 165 mg/dL (ref 0–200)
HDL: 43.1 mg/dL (ref >39.00)
LDL CALC: 90 mg/dL (ref 0–99)
NONHDL: 121.9
Total CHOL/HDL Ratio: 4
Triglycerides: 159 mg/dL — ABNORMAL HIGH (ref 0.0–149.0)
VLDL: 31.8 mg/dL (ref 0.0–40.0)

## 2014-03-10 NOTE — Progress Notes (Signed)
Order placed for gyn referral.  

## 2014-03-10 NOTE — Progress Notes (Signed)
Order placed for f/u fasting glucose and a1c.

## 2014-03-16 ENCOUNTER — Telehealth: Payer: Self-pay | Admitting: *Deleted

## 2014-03-16 ENCOUNTER — Other Ambulatory Visit: Payer: Self-pay | Admitting: Internal Medicine

## 2014-03-16 DIAGNOSIS — R87610 Atypical squamous cells of undetermined significance on cytologic smear of cervix (ASC-US): Secondary | ICD-10-CM

## 2014-03-16 NOTE — Telephone Encounter (Signed)
Order placed for referral to gyn.  

## 2014-03-16 NOTE — Progress Notes (Signed)
Order placed for referral to gyn.  

## 2014-03-16 NOTE — Telephone Encounter (Signed)
Pt called states she has had abnormal PAPs.  She further states she is wanting a hysterectomy due to the abnormal cells.  She is researching GYN MDs in [place], requesting whether you have any suggestions.  Please advise

## 2014-03-16 NOTE — Telephone Encounter (Signed)
Spoke with pt, she states she wants to be referred to Dr Cheri Fowler at [facility].  Their number is [phone].

## 2014-03-16 NOTE — Telephone Encounter (Signed)
Per chart, she had seen Dr Benjie Karvonen previously.  This is who I had made the referral - to f/u.  Does she want to see someone else.  If so, let me know who she wants [place] to schedule her appointment with.

## 2014-03-17 ENCOUNTER — Other Ambulatory Visit: Payer: Medicare PPO

## 2014-04-06 ENCOUNTER — Other Ambulatory Visit: Payer: Self-pay | Admitting: Internal Medicine

## 2014-04-07 ENCOUNTER — Telehealth: Payer: Self-pay | Admitting: Internal Medicine

## 2014-04-07 NOTE — Telephone Encounter (Signed)
Left msg for pt to call the office to reschedule cancel lab appt 3/9.msn

## 2014-04-08 ENCOUNTER — Other Ambulatory Visit: Payer: Medicare PPO

## 2014-04-14 ENCOUNTER — Other Ambulatory Visit (INDEPENDENT_AMBULATORY_CARE_PROVIDER_SITE_OTHER): Payer: Medicare PPO

## 2014-04-14 DIAGNOSIS — R739 Hyperglycemia, unspecified: Secondary | ICD-10-CM

## 2014-04-14 LAB — HEMOGLOBIN A1C: Hgb A1c MFr Bld: 6 % (ref 4.6–6.5)

## 2014-04-15 ENCOUNTER — Encounter: Payer: Self-pay | Admitting: *Deleted

## 2014-04-15 LAB — GLUCOSE, FASTING: GLUCOSE, FASTING: 109 mg/dL — AB (ref 70–99)

## 2014-05-25 ENCOUNTER — Encounter: Payer: Self-pay | Admitting: *Deleted

## 2014-05-26 ENCOUNTER — Other Ambulatory Visit: Payer: Self-pay | Admitting: Internal Medicine

## 2014-06-01 ENCOUNTER — Ambulatory Visit: Payer: Medicare PPO | Admitting: Student in an Organized Health Care Education/Training Program

## 2014-06-01 ENCOUNTER — Telehealth: Payer: Self-pay | Admitting: *Deleted

## 2014-06-01 ENCOUNTER — Encounter
Admission: RE | Disposition: A | Discharge status: Home / Self Care | Payer: Self-pay | Source: Ambulatory Visit | Attending: Ophthalmology

## 2014-06-01 ENCOUNTER — Ambulatory Visit
Admission: RE | Admit: 2014-06-01 | Discharge: 2014-06-01 | Disposition: A | Discharge status: Home / Self Care | Payer: Medicare PPO | Source: Ambulatory Visit | Attending: Ophthalmology | Admitting: Ophthalmology

## 2014-06-01 DIAGNOSIS — K219 Gastro-esophageal reflux disease without esophagitis: Secondary | ICD-10-CM | POA: Insufficient documentation

## 2014-06-01 DIAGNOSIS — J45909 Unspecified asthma, uncomplicated: Secondary | ICD-10-CM | POA: Insufficient documentation

## 2014-06-01 DIAGNOSIS — H02834 Dermatochalasis of left upper eyelid: Secondary | ICD-10-CM | POA: Insufficient documentation

## 2014-06-01 DIAGNOSIS — I10 Essential (primary) hypertension: Secondary | ICD-10-CM | POA: Diagnosis not present

## 2014-06-01 DIAGNOSIS — M810 Age-related osteoporosis without current pathological fracture: Secondary | ICD-10-CM | POA: Insufficient documentation

## 2014-06-01 DIAGNOSIS — K2270 Barrett's esophagus without dysplasia: Secondary | ICD-10-CM | POA: Insufficient documentation

## 2014-06-01 DIAGNOSIS — M1990 Unspecified osteoarthritis, unspecified site: Secondary | ICD-10-CM | POA: Diagnosis not present

## 2014-06-01 DIAGNOSIS — Z85828 Personal history of other malignant neoplasm of skin: Secondary | ICD-10-CM | POA: Diagnosis not present

## 2014-06-01 DIAGNOSIS — F329 Major depressive disorder, single episode, unspecified: Secondary | ICD-10-CM | POA: Diagnosis not present

## 2014-06-01 DIAGNOSIS — Z79899 Other long term (current) drug therapy: Secondary | ICD-10-CM | POA: Insufficient documentation

## 2014-06-01 DIAGNOSIS — H02831 Dermatochalasis of right upper eyelid: Secondary | ICD-10-CM | POA: Diagnosis not present

## 2014-06-01 DIAGNOSIS — E780 Pure hypercholesterolemia: Secondary | ICD-10-CM | POA: Diagnosis not present

## 2014-06-01 DIAGNOSIS — H02403 Unspecified ptosis of bilateral eyelids: Secondary | ICD-10-CM | POA: Diagnosis not present

## 2014-06-01 DIAGNOSIS — K449 Diaphragmatic hernia without obstruction or gangrene: Secondary | ICD-10-CM | POA: Diagnosis not present

## 2014-06-01 HISTORY — PX: PTOSIS REPAIR: SHX6568

## 2014-06-01 HISTORY — DX: Pneumonia, unspecified organism: J18.9

## 2014-06-01 HISTORY — DX: Personal history of other diseases of the digestive system: Z87.19

## 2014-06-01 HISTORY — DX: Age-related osteoporosis without current pathological fracture: M81.0

## 2014-06-01 SURGERY — REPAIR, BLEPHAROPTOSIS
Anesthesia: Monitor Anesthesia Care | Site: Eye | Laterality: Bilateral | Wound class: Clean

## 2014-06-01 MED ORDER — LACTATED RINGERS IV SOLN
INTRAVENOUS | Status: DC
  Administered 2014-06-01 (×3): via INTRAVENOUS

## 2014-06-01 MED ORDER — TETRACAINE HCL 0.5 % OP SOLN
OPHTHALMIC | Status: DC | PRN
  Administered 2014-06-01 (×2): 2 [drp] via OPHTHALMIC

## 2014-06-01 MED ORDER — BUPIVACAINE HCL (PF) 0.75 % IJ SOLN
INTRAMUSCULAR | Status: DC | PRN
  Administered 2014-06-01: 5 mL

## 2014-06-01 MED ORDER — MIDAZOLAM HCL 2 MG/2ML IJ SOLN
INTRAMUSCULAR | Status: DC | PRN
  Administered 2014-06-01: 2 mg via INTRAVENOUS

## 2014-06-01 MED ORDER — PROPOFOL INFUSION 10 MG/ML OPTIME
INTRAVENOUS | Status: DC | PRN
  Administered 2014-06-01: 50 ug/kg/min via INTRAVENOUS

## 2014-06-01 MED ORDER — ERYTHROMYCIN 5 MG/GM OP OINT
TOPICAL_OINTMENT | OPHTHALMIC | Status: DC | PRN
  Administered 2014-06-01: 1 via OPHTHALMIC

## 2014-06-01 MED ORDER — BSS IO SOLN
INTRAOCULAR | Status: DC | PRN
  Administered 2014-06-01: 15 mL via INTRAOCULAR

## 2014-06-01 MED ORDER — LIDOCAINE-EPINEPHRINE 2 %-1:100000 IJ SOLN
INTRAMUSCULAR | Status: DC | PRN
  Administered 2014-06-01: 5 mL via INTRADERMAL

## 2014-06-01 MED ORDER — LIDOCAINE HCL (CARDIAC) 20 MG/ML IV SOLN
INTRAVENOUS | Status: DC | PRN
  Administered 2014-06-01: 30 mg via INTRAVENOUS

## 2014-06-01 MED ORDER — ALFENTANIL 500 MCG/ML IJ INJ
INJECTION | INTRAMUSCULAR | Status: DC | PRN
  Administered 2014-06-01: 700 ug via INTRAVENOUS

## 2014-06-01 MED ORDER — HYALURONIDASE HUMAN 150 UNIT/ML IJ SOLN
INTRAMUSCULAR | Status: DC | PRN
  Administered 2014-06-01: .1 [IU] via SUBCUTANEOUS

## 2014-06-01 SURGICAL SUPPLY — 22 items
BLADE SURG 15 STRL LF DISP TIS (BLADE) ×1 IMPLANT
BLADE SURG 15 STRL SS (BLADE) ×2
CORD BIP STRL DISP 12FT (MISCELLANEOUS) ×3 IMPLANT
DRAPE HEAD BAR (DRAPES) ×3 IMPLANT
GAUZE SPONGE 4X4 12PLY STRL (GAUZE/BANDAGES/DRESSINGS) ×3 IMPLANT
GAUZE SPONGE NON-WVN 2X2 STRL (MISCELLANEOUS) ×10 IMPLANT
GLOVE SURG LX 7.0 MICRO (GLOVE) ×4
GLOVE SURG LX STRL 7.0 MICRO (GLOVE) ×2 IMPLANT
MARKER SKIN XFINE TIP W/RULER (MISCELLANEOUS) ×3 IMPLANT
NEEDLE FILTER BLUNT 18X 1/2SAF (NEEDLE) ×2
NEEDLE FILTER BLUNT 18X1 1/2 (NEEDLE) ×1 IMPLANT
NEEDLE HYPO 30X.5 LL (NEEDLE) ×6 IMPLANT
PACK DRAPE NASAL/ENT (PACKS) ×3 IMPLANT
SOL PREP PVP 2OZ (MISCELLANEOUS) ×3
SOLUTION PREP PVP 2OZ (MISCELLANEOUS) ×1 IMPLANT
SPONGE VERSALON 2X2 STRL (MISCELLANEOUS) ×20
SUT PLAIN GUT (SUTURE) ×3 IMPLANT
SUT PROLENE 5 0 P 3 (SUTURE) IMPLANT
SUT PROLENE 6 0 P 1 18 (SUTURE) ×6 IMPLANT
SYR 3ML LL SCALE MARK (SYRINGE) ×3 IMPLANT
SYRINGE 10CC LL (SYRINGE) ×3 IMPLANT
WATER STERILE IRR 500ML POUR (IV SOLUTION) ×3 IMPLANT

## 2014-06-01 NOTE — Addendum Note (Signed)
Addendum  created 06/01/14 1037 by Ronelle Nigh, MD   Modules edited: Anesthesia Attestations

## 2014-06-01 NOTE — Transfer of Care (Signed)
Immediate Anesthesia Transfer of Care Note  Patient: Kaitlin Dean  Procedure(s) Performed: Procedure(s): PTOSIS REPAIR (Bilateral)  Patient Location: PACU  Anesthesia Type: MAC  Level of Consciousness: awake, alert  and patient cooperative  Airway and Oxygen Therapy: Patient Spontanous Breathing and Patient connected to supplemental oxygen  Post-op Assessment: Post-op Vital signs reviewed, Patient's Cardiovascular Status Stable, Respiratory Function Stable, Patent Airway and No signs of Nausea or vomiting  Post-op Vital Signs: Reviewed and stable  Complications: No apparent anesthesia complications

## 2014-06-01 NOTE — H&P (Signed)
  See written H&P in paper chart

## 2014-06-01 NOTE — Anesthesia Preprocedure Evaluation (Addendum)
Anesthesia Evaluation    Reviewed: Allergy & Precautions, H&P , NPO status , Patient's Chart, lab work & pertinent test results  Airway Mallampati: I  TM Distance: >3 FB Neck ROM: full    Dental no notable dental hx.    Pulmonary    Pulmonary exam normal       Cardiovascular hypertension,     Neuro/Psych    GI/Hepatic GERD-  ,  Endo/Other    Renal/GU      Musculoskeletal   Abdominal Normal abdominal exam  (+)   Peds  Hematology   Anesthesia Other Findings   Reproductive/Obstetrics                            Anesthesia Physical Anesthesia Plan  ASA: II  Anesthesia Plan: MAC   Post-op Pain Management:    Induction:   Airway Management Planned:   Additional Equipment:   Intra-op Plan:   Post-operative Plan:   Informed Consent: I have reviewed the patients History and Physical, chart, labs and discussed the procedure including the risks, benefits and alternatives for the proposed anesthesia with the patient or authorized representative who has indicated his/her understanding and acceptance.     Plan Discussed with: CRNA  Anesthesia Plan Comments:         Anesthesia Quick Evaluation

## 2014-06-01 NOTE — Interval H&P Note (Signed)
History and Physical Interval Note:  06/01/2014 8:14 AM  Kaitlin Dean  has presented today for surgery, with the diagnosis of H02.403 UNSPECIFIED PTOSIS OF BILATERAL EYE LIDS  The various methods of treatment have been discussed with the patient and family. After consideration of risks, benefits and other options for treatment, the patient has consented to  Procedure(s): PTOSIS REPAIR (Bilateral) as a surgical intervention .  The patient's history has been reviewed, patient examined, no change in status, stable for surgery.  I have reviewed the patient's chart and labs.  Questions were answered to the patient's satisfaction.     Vickki Muff, Fabrice Dyal M

## 2014-06-01 NOTE — Anesthesia Postprocedure Evaluation (Signed)
  Anesthesia Post-op Note  Patient: Kaitlin Dean  Procedure(s) Performed: Procedure(s): PTOSIS REPAIR (Bilateral)  Anesthesia type:MAC  Patient location: PACU  Post pain: Pain level controlled  Post assessment: Post-op Vital signs reviewed, Patient's Cardiovascular Status Stable, Respiratory Function Stable, Patent Airway and No signs of Nausea or vomiting  Post vital signs: Reviewed and stable  Last Vitals:  Filed Vitals:   06/01/14 1006  BP: 122/72  Pulse: 64  Temp:   Resp: 12    Level of consciousness: awake, alert  and patient cooperative  Complications: No apparent anesthesia complications

## 2014-06-01 NOTE — Anesthesia Procedure Notes (Signed)
Procedure Name: MAC Date/Time: 06/01/2014 8:28 AM Performed by: Londell Moh Pre-anesthesia Checklist: Patient identified, Emergency Drugs available, Suction available, Timeout performed and Patient being monitored Patient Re-evaluated:Patient Re-evaluated prior to inductionOxygen Delivery Method: Nasal cannula Placement Confirmation: positive ETCO2

## 2014-06-01 NOTE — Discharge Instructions (Signed)
General Anesthesia, Care After Refer to this sheet in the next few weeks. These instructions provide you with information on caring for yourself after your procedure. Your health care provider may also give you more specific instructions. Your treatment has been planned according to current medical practices, but problems sometimes occur. Call your health care provider if you have any problems or questions after your procedure. WHAT TO EXPECT AFTER THE PROCEDURE After the procedure, it is typical to experience:  Sleepiness.  Nausea and vomiting. HOME CARE INSTRUCTIONS  For the first 24 hours after general anesthesia:  Have a responsible person with you.  Do not drive a car. If you are alone, do not take public transportation.  Do not drink alcohol.  Do not take medicine that has not been prescribed by your health care provider.  Do not sign important papers or make important decisions.  You may resume a normal diet and activities as directed by your health care provider.  Change bandages (dressings) as directed.  If you have questions or problems that seem related to general anesthesia, call the hospital and ask for the anesthetist or anesthesiologist on call. SEEK MEDICAL CARE IF:  You have nausea and vomiting that continue the day after anesthesia.  You develop a rash. SEEK IMMEDIATE MEDICAL CARE IF:   You have difficulty breathing.  You have chest pain.  You have any allergic problems. Document Released: 04/23/2000 Document Revised: 01/20/2013 Document Reviewed: 07/31/2012 [facility] Patient Information 2015 [place], [place]. This information is not intended to replace advice given to you by your health care provider. Make sure you discuss any questions you have with your health care provider.   Blepharoplasty Care After Refer to this sheet in the next few weeks. These discharge instructions provide you with general information on caring for yourself after you leave the  hospital. Your caregiver may also give you specific instructions. Your treatment has been planned according to the most current medical practices available, but unavoidable complications occasionally occur. If you have any problems or questions after discharge, call your caregiver. SEEK MEDICAL CARE IF:   You develop bleeding.  You develop redness, swelling, or increasing pain in your eyelids.  You develop facial pain.  You have pus coming from the wound. SEEK IMMEDIATE MEDICAL CARE IF:   You have a fever.  You develop a rash.  You have difficulty breathing.  You develop any reaction or side effects to medicines given. Document Released: 08/04/2004 Document Revised: 04/09/2011 Document Reviewed: 06/02/2007 [facility] Patient Information 2015 [place], [place]. This information is not intended to replace advice given to you by your health care provider. Make sure you discuss any questions you have with your health care provider.

## 2014-06-01 NOTE — Op Note (Signed)
Preoperative Diagnosis:  1. Visually significant blepharoptosis Both Upper Eyelid(s) 2. Visually significant dermatochalasis Both Upper Eyelid(s)  Postoperative Diagnosis:  Same.  Procedure(s) Performed:   1. Blepharoptosis repair with levator aponeurosis advancement Both Upper Eyelid(s) 2. Upper eyelid blepharoplasty with excess skin excision  Both Upper Eyelid(s)  Teaching Surgeon: Philis Pique. Vickki Muff, M.D.  Assistants: none  Anesthesia: MAC  Specimens: None.  Estimated Blood Loss: Minimal.  Complications: None.  Operative Findings: None Dictated  Procedure:   Allergies were reviewed and the patient Flagyl.   After the risks, benefits, complications and alternatives were discussed with the patient, appropriate informed consent was obtained.  While seated in an upright position and looking in primary gaze, the mid pupillary line was marked on the upper eyelid margins bilaterally. The patient was then brought to the operating suite and reclined supine.  Timeout was conducted and The patient was sedated.  Local anesthetic consisting of 50:50 mixture of 2% lidocaine with epi and 0.75% bupivicaine was injected subcutaneously to Both upper eyelid(s). After adequate local was instilled, the patient was prepped and draped in the usual sterile fashion for eyelid surgery.   Attention was turned to the upper eyelids. A 57mm upper eyelid crease incision line was marked with calipers on Both upper eyelid(s).  A pinch test was used to estimate the amount of excess skin to remove and this was marked in standard blepharoplasty style fashion. Attention was turned to the  Right upper eyelid. A #15 blade was used to open the premarked incision line. A Skin only flap was excised and hemostasis was obtained with bipolar cautery.   Westcott scissors were then used to transect through orbicularis down to the tarsal plate. Epitarsus was dissected to create a smooth surface to suture to. Dissection was then  carried superiorly in the plane between orbicularis and orbital septum. Once the preaponeurotic fat pocket was identified, the orbital septum was opened. This revealed the levator and its aponeurosis.    Attention was then turned to the opposite eyelid where the same procedure was performed in the same manner. Hemostasis was obtained with bipolar cautery throughout.   3 interrupted 6-0 Prolene sutures were then passed partial thickness through the tarsal plates of both upper eyelid(s). These sutures were placed in line with the mid pupillary, medial limbal, and lateral limbal lines. The sutures were fixed to the levator aponeurosis and adjusted until a nice lid height and contour were achieved. Once nice symmetry was achieved, the skin incisions were closed with a running 6-0 fast absorbing plain suture. The patient tolerated the procedure well.  Bacitracin ophthalmic ointment was applied to the incision site(s) followed by ice packs. The patient was taken to the recovery area where she recovered without difficulty.  Post-Op Plan/Instructions:  The patient was instructed to use ice packs frequently for the next 48 hours. She was instructed to use Bacitracin ointment on her incisions 4 times a day for the next 12 to 14 days. Shewas given a prescription for Percocet for pain control should Tylenol not be effective. She was asked to to follow up at the [facility] in [place], [place] in 2 weeks' time or sooner as needed for problems.  Teaching Surgeon Attestation: None  Aarish Rockers M. Vickki Muff, M.D. Attending,Ophthalmology

## 2014-06-07 ENCOUNTER — Encounter: Payer: Self-pay | Admitting: Ophthalmology

## 2014-07-17 ENCOUNTER — Other Ambulatory Visit: Payer: Self-pay | Admitting: Internal Medicine

## 2014-08-06 ENCOUNTER — Encounter: Payer: Self-pay | Admitting: Internal Medicine

## 2014-08-07 ENCOUNTER — Encounter: Payer: Self-pay | Admitting: Internal Medicine

## 2014-08-07 DIAGNOSIS — Z0000 Encounter for general adult medical examination without abnormal findings: Secondary | ICD-10-CM | POA: Insufficient documentation

## 2014-08-16 ENCOUNTER — Other Ambulatory Visit: Payer: Self-pay | Admitting: Internal Medicine

## 2014-08-17 NOTE — Telephone Encounter (Signed)
Refilled wellbutrin #30 with one refill.

## 2014-08-17 NOTE — Telephone Encounter (Signed)
Last OV 2.3.16, last refill 3.8.16.  Please advise refill.

## 2014-09-01 ENCOUNTER — Encounter: Payer: Self-pay | Admitting: Internal Medicine

## 2014-09-01 ENCOUNTER — Ambulatory Visit (INDEPENDENT_AMBULATORY_CARE_PROVIDER_SITE_OTHER): Payer: Medicare PPO | Admitting: Internal Medicine

## 2014-09-01 VITALS — BP 114/78 | HR 79 | Temp 98.2°F | Resp 18 | Ht 63.0 in | Wt 194.0 lb

## 2014-09-01 DIAGNOSIS — Z0000 Encounter for general adult medical examination without abnormal findings: Secondary | ICD-10-CM

## 2014-09-01 DIAGNOSIS — I10 Essential (primary) hypertension: Secondary | ICD-10-CM

## 2014-09-01 DIAGNOSIS — E669 Obesity, unspecified: Secondary | ICD-10-CM

## 2014-09-01 DIAGNOSIS — R739 Hyperglycemia, unspecified: Secondary | ICD-10-CM

## 2014-09-01 DIAGNOSIS — E7800 Pure hypercholesterolemia, unspecified: Secondary | ICD-10-CM

## 2014-09-01 DIAGNOSIS — B977 Papillomavirus as the cause of diseases classified elsewhere: Secondary | ICD-10-CM

## 2014-09-01 DIAGNOSIS — R888 Abnormal findings in other body fluids and substances: Secondary | ICD-10-CM

## 2014-09-01 DIAGNOSIS — K2270 Barrett's esophagus without dysplasia: Secondary | ICD-10-CM

## 2014-09-01 DIAGNOSIS — Z1239 Encounter for other screening for malignant neoplasm of breast: Secondary | ICD-10-CM

## 2014-09-01 DIAGNOSIS — IMO0002 Reserved for concepts with insufficient information to code with codable children: Secondary | ICD-10-CM

## 2014-09-01 LAB — CBC WITH DIFFERENTIAL/PLATELET
BASOS ABS: 0 10*3/uL (ref 0.0–0.1)
BASOS PCT: 0.4 % (ref 0.0–3.0)
EOS ABS: 0.1 10*3/uL (ref 0.0–0.7)
EOS PCT: 2.8 % (ref 0.0–5.0)
HEMATOCRIT: 43 % (ref 36.0–46.0)
Hemoglobin: 14.6 g/dL (ref 12.0–15.0)
Lymphocytes Relative: 28.1 % (ref 12.0–46.0)
Lymphs Abs: 1.3 10*3/uL (ref 0.7–4.0)
MCHC: 33.9 g/dL (ref 30.0–36.0)
MCV: 91.2 fl (ref 78.0–100.0)
MONO ABS: 0.3 10*3/uL (ref 0.1–1.0)
Monocytes Relative: 5.7 % (ref 3.0–12.0)
NEUTROS ABS: 2.9 10*3/uL (ref 1.4–7.7)
NEUTROS PCT: 63 % (ref 43.0–77.0)
PLATELETS: 226 10*3/uL (ref 150.0–400.0)
RBC: 4.71 Mil/uL (ref 3.87–5.11)
RDW: 13.4 % (ref 11.5–15.5)
WBC: 4.5 10*3/uL (ref 4.0–10.5)

## 2014-09-01 LAB — BASIC METABOLIC PANEL
BUN: 18 mg/dL (ref 6–23)
CALCIUM: 9.3 mg/dL (ref 8.4–10.5)
CO2: 25 mEq/L (ref 19–32)
CREATININE: 0.9 mg/dL (ref 0.40–1.20)
Chloride: 107 mEq/L (ref 96–112)
GFR: 65.53 mL/min (ref >60.00)
Glucose, Bld: 112 mg/dL — ABNORMAL HIGH (ref 70–99)
POTASSIUM: 4.2 meq/L (ref 3.5–5.1)
Sodium: 141 mEq/L (ref 135–145)

## 2014-09-01 LAB — LIPID PANEL
CHOL/HDL RATIO: 3
Cholesterol: 146 mg/dL (ref 0–200)
HDL: 42.8 mg/dL (ref >39.00)
LDL Cholesterol: 83 mg/dL (ref 0–99)
NONHDL: 103.43
TRIGLYCERIDES: 101 mg/dL (ref 0.0–149.0)
VLDL: 20.2 mg/dL (ref 0.0–40.0)

## 2014-09-01 LAB — HEPATIC FUNCTION PANEL
ALBUMIN: 4.4 g/dL (ref 3.5–5.2)
ALT: 23 U/L (ref 0–35)
AST: 23 U/L (ref 0–37)
Alkaline Phosphatase: 105 U/L (ref 39–117)
BILIRUBIN DIRECT: 0.1 mg/dL (ref 0.0–0.3)
BILIRUBIN TOTAL: 0.8 mg/dL (ref 0.2–1.2)
Total Protein: 6.6 g/dL (ref 6.0–8.3)

## 2014-09-01 LAB — HEMOGLOBIN A1C: Hgb A1c MFr Bld: 5.7 % (ref 4.6–6.5)

## 2014-09-01 LAB — TSH: TSH: 3.21 u[IU]/mL (ref 0.35–4.50)

## 2014-09-01 NOTE — Progress Notes (Signed)
Patient ID: Kaitlin Dean, female   DOB: 11-18-1945, 69 y.o.   MRN: [id number]   Subjective:    Patient ID: Kaitlin Dean, female    DOB: Sep 14, 1945, 69 y.o.   MRN: [id number]  HPI  Patient here for a scheduled follow up.  Has recently moved.  Some increased stress with this.  Doing well.  Stays active.  No cardiac symptoms with increased activity or exertion.  No sob.  No acid reflux.  No abdominal pain or cramping.  Bowels stable.     Past Medical History  Diagnosis Date  . Hypertension   . Hypercholesterolemia   . GERD (gastroesophageal reflux disease)   . 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 Barrett's esophagus   . Chicken pox   . Complication of anesthesia     woke up 15 years ago and felt like could not breathe - no complications   . Diverticulosis of colon without hemorrhage 02/28/2014  . Pneumonia     in past  . History of hiatal hernia   . 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 Osteoporosis   . Skin cancer     Outpatient Encounter Prescriptions as of 09/01/2014  Medication Sig  . albuterol (PROVENTIL HFA;VENTOLIN HFA) 108 (90 BASE) MCG/ACT inhaler Inhale 2 puffs into the lungs every 6 (six) hours as needed for wheezing or shortness of breath.  Marland Kitchen aspirin EC 81 MG tablet Take 81 mg by mouth daily. PM  . buPROPion (WELLBUTRIN SR) 150 MG 12 hr tablet TAKE ONE TABLET EVERY MORNING  . Multiple Vitamin (MULTIVITAMIN) capsule Take 1 capsule by mouth daily. AM  . omeprazole (PRILOSEC) 20 MG capsule Take 20 mg by mouth daily. AM  . telmisartan (MICARDIS) 40 MG tablet TAKE ONE TABLET BY MOUTH EVERY MORNING  . VYTORIN 10-20 MG per tablet TAKE ONE TABLET AT BEDTIME   No facility-administered encounter medications on file as of 09/01/2014.    Review of Systems  Constitutional: Negative for appetite change and unexpected weight change.  HENT: Negative for congestion and sinus pressure.   Respiratory: Negative for cough, chest tightness and shortness of breath.     Cardiovascular: Negative for chest pain, palpitations and leg swelling.  Gastrointestinal: Negative for nausea, vomiting, abdominal pain and diarrhea.  Skin: Negative for color change and rash.  Neurological: Negative for dizziness, light-headedness and headaches.  Psychiatric/Behavioral: Negative for dysphoric mood and agitation.       Objective:     Blood pressure recheck:  104/68  Physical Exam  Constitutional: She appears well-developed and well-nourished. No distress.  HENT:  Nose: Nose normal.  Mouth/Throat: Oropharynx is clear and moist.  Neck: Neck supple. No thyromegaly present.  Cardiovascular: Normal rate and regular rhythm.   Pulmonary/Chest: Breath sounds normal. No respiratory distress. She has no wheezes.  Abdominal: Soft. Bowel sounds are normal. There is no tenderness.  Musculoskeletal: She exhibits no edema or tenderness.  Lymphadenopathy:    She has no cervical adenopathy.  Skin: No rash noted. No erythema.  Psychiatric: She has a normal mood and affect. Her behavior is normal.    BP 114/78 mmHg  Pulse 79  Temp(Src) 98.2 F (36.8 C) (Oral)  Resp 18  Ht _0  (1.6 m)  Wt 194 lb (87.998 kg)  BMI 34.37 kg/m2  SpO2 97%  LMP 01/29/1993 Wt Readings from Last 3 Encounters:  09/01/14 194 lb (87.998 kg)  06/01/14 196 lb (88.905 kg)  03/03/14 196 lb 8 oz (  89.132 kg)     Lab Results  Component Value Date   WBC 4.5 09/01/2014   HGB 14.6 09/01/2014   HCT 43.0 09/01/2014   PLT 226.0 09/01/2014   GLUCOSE 112* 09/01/2014   CHOL 146 09/01/2014   TRIG 101.0 09/01/2014   HDL 42.80 09/01/2014   LDLDIRECT 182.4 02/05/2012   LDLCALC 83 09/01/2014   ALT 23 09/01/2014   AST 23 09/01/2014   NA 141 09/01/2014   K 4.2 09/01/2014   CL 107 09/01/2014   CREATININE 0.90 09/01/2014   BUN 18 09/01/2014   CO2 25 09/01/2014   TSH 3.21 09/01/2014   INR 0.99 04/22/2012   HGBA1C 5.7 09/01/2014       Assessment & Plan:   Problem List Items Addressed This Visit     Barrett's esophagus    Had f/u EGD 01/2013.  Symptoms controlled.        Health care maintenance    Schedule a physical next visit.  Mammogram 12/04/13 - Birads II.  Pelvic/pap - through gyn.  They are going to follow yearly.        HPV test positive    Evaluated 03/29/14 - Dr Sherren Mocha Meisinger - colposcopy - low grade squamous intraepithelial (CIN-I, mild dysplasia) - cervix biopsy.  Low grade squamous intraepithelial lesion, CIN-I (mild dysplasia) - vaginal biopsy.  Recommended f/u in one year.        Hypercholesterolemia    On vytorin.  Low cholesterol diet and exercise.  Follow lipid panel and liver function tests.        Relevant Orders   Lipid panel (Completed)   Hepatic function panel (Completed)   Hyperglycemia    Low carb diet and exercise.  Follow met b and a1c.       Relevant Orders   CBC with Differential/Platelet (Completed)   TSH (Completed)   Hemoglobin A1c (Completed)   Hypertension - Primary    Blood pressure as outlined.  Discussed decreasing the dose.  She declined.  States she has tried this previously and her blood pressure increased.  Follow pressures.  Check metabolic panel.        Relevant Orders   Basic metabolic panel (Completed)   Obesity (BMI 30-39.9)    Diet and exercise.         Other Visit Diagnoses    Breast cancer screening        Relevant Orders    MM DIGITAL SCREENING BILATERAL        Einar Pheasant, MD

## 2014-09-01 NOTE — Progress Notes (Signed)
Pre visit review using our clinic review tool, if applicable. No additional management support is needed unless otherwise documented below in the visit note. 

## 2014-09-02 ENCOUNTER — Encounter: Payer: Self-pay | Admitting: *Deleted

## 2014-09-05 ENCOUNTER — Encounter: Payer: Self-pay | Admitting: Internal Medicine

## 2014-09-05 NOTE — Assessment & Plan Note (Signed)
On vytorin.  Low cholesterol diet and exercise.  Follow lipid panel and liver function tests.   

## 2014-09-05 NOTE — Assessment & Plan Note (Signed)
Schedule a physical next visit.  Mammogram 12/04/13 - Birads II.  Pelvic/pap - through gyn.  They are going to follow yearly.

## 2014-09-05 NOTE — Assessment & Plan Note (Signed)
Evaluated 03/29/14 - Dr Sherren Mocha Meisinger - colposcopy - low grade squamous intraepithelial (CIN-I, mild dysplasia) - cervix biopsy.  Low grade squamous intraepithelial lesion, CIN-I (mild dysplasia) - vaginal biopsy.  Recommended f/u in one year.

## 2014-09-05 NOTE — Assessment & Plan Note (Signed)
Blood pressure as outlined.  Discussed decreasing the dose.  She declined.  States she has tried this previously and her blood pressure increased.  Follow pressures.  Check metabolic panel.

## 2014-09-05 NOTE — Assessment & Plan Note (Signed)
Low carb diet and exercise.  Follow met b and a1c.  

## 2014-09-05 NOTE — Assessment & Plan Note (Signed)
Diet and exercise.   

## 2014-09-05 NOTE — Assessment & Plan Note (Signed)
Had f/u EGD 01/2013.  Symptoms controlled.

## 2014-10-14 ENCOUNTER — Other Ambulatory Visit: Payer: Self-pay | Admitting: Internal Medicine

## 2014-11-26 DIAGNOSIS — Z08 Encounter for follow-up examination after completed treatment for malignant neoplasm: Secondary | ICD-10-CM | POA: Diagnosis not present

## 2014-11-26 DIAGNOSIS — L821 Other seborrheic keratosis: Secondary | ICD-10-CM | POA: Diagnosis not present

## 2014-11-26 DIAGNOSIS — Z85828 Personal history of other malignant neoplasm of skin: Secondary | ICD-10-CM | POA: Diagnosis not present

## 2014-12-15 ENCOUNTER — Other Ambulatory Visit: Payer: Self-pay | Admitting: Internal Medicine

## 2014-12-31 ENCOUNTER — Ambulatory Visit: Payer: Medicare PPO

## 2014-12-31 ENCOUNTER — Encounter: Payer: Self-pay | Admitting: Family Medicine

## 2014-12-31 ENCOUNTER — Ambulatory Visit (INDEPENDENT_AMBULATORY_CARE_PROVIDER_SITE_OTHER): Payer: Medicare PPO | Admitting: Family Medicine

## 2014-12-31 VITALS — BP 130/82 | HR 75 | Temp 99.3°F | Ht 63.0 in | Wt 193.1 lb

## 2014-12-31 DIAGNOSIS — J209 Acute bronchitis, unspecified: Secondary | ICD-10-CM | POA: Diagnosis not present

## 2014-12-31 MED ORDER — ALBUTEROL SULFATE HFA 108 (90 BASE) MCG/ACT IN AERS: 2.0000 | INHALATION_SPRAY | Freq: Four times a day (QID) | RESPIRATORY_TRACT | Status: DC | PRN

## 2014-12-31 MED ORDER — BENZONATATE 100 MG PO CAPS: 100.0000 mg | ORAL_CAPSULE | Freq: Three times a day (TID) | ORAL | Status: DC | PRN

## 2014-12-31 MED ORDER — HYDROCODONE-HOMATROPINE 5-1.5 MG/5ML PO SYRP: 5.0000 mL | ORAL_SOLUTION | Freq: Three times a day (TID) | ORAL | Status: DC | PRN

## 2014-12-31 NOTE — Progress Notes (Signed)
Pre visit review using our clinic review tool, if applicable. No additional management support is needed unless otherwise documented below in the visit note. 

## 2014-12-31 NOTE — Patient Instructions (Signed)
This is likely viral in nature as this is going around the community.  Use the Hycodan at night and tessalon at during the day.  Use the albuterol as needed.  Call if you fail to worsen or fail to improve  Take care  Dr. Lacinda Axon

## 2014-12-31 NOTE — Progress Notes (Signed)
Subjective:  Patient ID: Kaitlin Dean, female    DOB: 12-19-1945  Age: 69 y.o. MRN: [id number]  CC: Cough, fatigue  HPI:  69 year old female presents to clinic today for an acute visit with complaints of cough.  Cough  Patient reports a 5 day history of cough.  She reports associated fatigue.  Cough is mildly productive.  She's been taking Benadryl and Delsym for her cough with little improvement.  No associated fever, shortness of breath.  No known exacerbating factors.  Patient is concerned that she has a sinus infection as she has had discolored mucus from her nose as well.  Social Hx   Social History   Social History  . Marital Status: Widowed    Spouse Name: N/A  . Number of Children: 2  . Years of Education: N/A   Social History Main Topics  . Smoking status: Never Smoker   . Smokeless tobacco: Never Used  . Alcohol Use: 0.6 oz/week    0 Standard drinks or equivalent, 1 Glasses of wine per week     Comment: occasional  . Drug Use: No  . Sexual Activity: Not Asked   Other Topics Concern  . None   Social History Narrative   Review of Systems  Constitutional: Positive for fatigue. Negative for fever.  HENT: Positive for congestion.   Respiratory: Positive for cough.    Objective:  BP 130/82 mmHg  Pulse 75  Temp(Src) 99.3 F (37.4 C) (Oral)  Ht 5\' 3"  (1.6 m)  Wt 193 lb 2 oz (87.601 kg)  BMI 34.22 kg/m2  SpO2 96%  LMP 01/29/1993  BP/Weight 12/31/2014 [id number]  Systolic BP [id number]  Diastolic BP 82 78 72  Wt. (Lbs) 193.13 194 196  BMI 34.22 34.37 34.73   Physical Exam  Constitutional: She appears well-developed. No distress.  HENT:  Head: Normocephalic and atraumatic.  Right Ear: External ear normal.  Left Ear: External ear normal.  Mouth/Throat: Oropharynx is clear and moist. No oropharyngeal exudate.  Left TM with effusion. Right TM normal.   Cardiovascular: Normal rate and regular rhythm.   Pulmonary/Chest: Effort  normal and breath sounds normal. No respiratory distress. She has no wheezes. She has no rales.  Neurological: She is alert.  Psychiatric: She has a normal mood and affect.  Vitals reviewed.  Lab Results  Component Value Date   WBC 4.5 09/01/2014   HGB 14.6 09/01/2014   HCT 43.0 09/01/2014   PLT 226.0 09/01/2014   GLUCOSE 112* 09/01/2014   CHOL 146 09/01/2014   TRIG 101.0 09/01/2014   HDL 42.80 09/01/2014   LDLDIRECT 182.4 02/05/2012   LDLCALC 83 09/01/2014   ALT 23 09/01/2014   AST 23 09/01/2014   NA 141 09/01/2014   K 4.2 09/01/2014   CL 107 09/01/2014   CREATININE 0.90 09/01/2014   BUN 18 09/01/2014   CO2 25 09/01/2014   TSH 3.21 09/01/2014   INR 0.99 04/22/2012   HGBA1C 5.7 09/01/2014    Assessment & Plan:   Problem List Items Addressed This Visit    Acute bronchitis - Primary    New Problem. History consistent with acute bronchitis. I advised her that antibiotics are not indicated. Treating with Tessalon, Hycodan, and PRN Albuterol.          Meds ordered this encounter  Medications  . HYDROcodone-homatropine (HYCODAN) 5-1.5 MG/5ML syrup    Sig: Take 5 mLs by mouth every 8 (eight) hours as needed for cough.    Dispense:  120 mL    Refill:  0  . benzonatate (TESSALON) 100 MG capsule    Sig: Take 1 capsule (100 mg total) by mouth 3 (three) times daily as needed.    Dispense:  30 capsule    Refill:  0  . albuterol (PROVENTIL HFA;VENTOLIN HFA) 108 (90 BASE) MCG/ACT inhaler    Sig: Inhale 2 puffs into the lungs every 6 (six) hours as needed for wheezing or shortness of breath.    Dispense:  1 Inhaler    Refill:  0    Follow-up: Return if symptoms worsen or fail to improve.  [place]

## 2014-12-31 NOTE — Assessment & Plan Note (Addendum)
New Problem. History consistent with acute bronchitis. I advised her that antibiotics are not indicated. Treating with Tessalon, Hycodan, and PRN Albuterol.

## 2015-01-18 ENCOUNTER — Ambulatory Visit (INDEPENDENT_AMBULATORY_CARE_PROVIDER_SITE_OTHER): Payer: Medicare PPO | Admitting: *Deleted

## 2015-01-18 DIAGNOSIS — Z23 Encounter for immunization: Secondary | ICD-10-CM

## 2015-01-28 ENCOUNTER — Other Ambulatory Visit: Payer: Self-pay | Admitting: Internal Medicine

## 2015-02-14 ENCOUNTER — Ambulatory Visit (INDEPENDENT_AMBULATORY_CARE_PROVIDER_SITE_OTHER): Payer: Medicare Other

## 2015-02-14 ENCOUNTER — Telehealth: Payer: Self-pay

## 2015-02-14 VITALS — BP 118/80 | HR 72 | Temp 97.7°F | Resp 14 | Ht 63.0 in | Wt 195.4 lb

## 2015-02-14 DIAGNOSIS — Z1239 Encounter for other screening for malignant neoplasm of breast: Secondary | ICD-10-CM

## 2015-02-14 DIAGNOSIS — Z1159 Encounter for screening for other viral diseases: Secondary | ICD-10-CM | POA: Diagnosis not present

## 2015-02-14 DIAGNOSIS — E2839 Other primary ovarian failure: Secondary | ICD-10-CM | POA: Diagnosis not present

## 2015-02-14 DIAGNOSIS — Z0000 Encounter for general adult medical examination without abnormal findings: Secondary | ICD-10-CM | POA: Diagnosis not present

## 2015-02-14 NOTE — Telephone Encounter (Signed)
She was seen by Dr. Lacinda Axon several weeks ago and given cough syrup, but now has concerns of agitated esophagus from coughing so much. She would like to see Dr. Kathyrn Sheriff at [facility]NT. Is this ok to place?     Need to confirm if she is coughing up blood. I am ok if she sees Dr Kathyrn Sheriff, but just want to confirm what is going on. Does she feel the blooc id coming from mucus draining.     Not currently coughing up blood.  Seen by Dr. Lacinda Axon in Dec and reported having green mucus, therefore requesting an antibiotic. Did not receive antibiotic and cough became worse. No blood has been seen in 1 month. She is uncomfortable because Hx of Barrett's Disease and wants to just be checked out to make sure everything is ok. Seeing the blood at that time has made her nervous.    See if she can come in tomorrow at 4:00 for evaluation. Also, can this be made into a phone message. Thanks    She has ageed to come in for evaluation tomorrow at 4pm.  Appointment to be scheduled.

## 2015-02-14 NOTE — Patient Instructions (Addendum)
Make 6 month follow up appointment with Dr. Nicki Reaper.   Schedule fasting labs 1-2 weeks prior to follow up.   Bone Densitometry Bone densitometry is an imaging test that uses a special X-ray to measure the amount of calcium and other minerals in your bones (bone density). This test is also known as a bone mineral density test or dual-energy X-ray absorptiometry (DXA). The test can measure bone density at your hip and your spine. It is similar to having a regular X-ray. You may have this test to:  Diagnose a condition that causes weak or thin bones (osteoporosis).  Predict your risk of a broken bone (fracture).  Determine how well osteoporosis treatment is working. LET [facility] CARE PROVIDER KNOW ABOUT:  Any allergies you have.  All medicines you are taking, including vitamins, herbs, eye drops, creams, and over-the-counter medicines.  Previous problems you or members of your family have had with the use of anesthetics.  Any blood disorders you have.  Previous surgeries you have had.  Medical conditions you have.  Possibility of pregnancy.  Any other medical test you had within the previous 14 days that used contrast material. RISKS AND COMPLICATIONS Generally, this is a safe procedure. However, problems can occur and may include the following:  This test exposes you to a very small amount of radiation.  The risks of radiation exposure may be greater to unborn children. BEFORE THE PROCEDURE  Do not take any calcium supplements for 24 hours before having the test. You can otherwise eat and drink what you usually do.  Take off all metal jewelry, eyeglasses, dental appliances, and any other metal objects. PROCEDURE  You may lie on an exam table. There will be an X-ray generator below you and an imaging device above you.  Other devices, such as boxes or braces, may be used to position your body properly for the scan.  You will need to lie still while the machine slowly scans  your body.  The images will show up on a computer monitor. AFTER THE PROCEDURE You may need more testing at a later time.   This information is not intended to replace advice given to you by your health care provider. Make sure you discuss any questions you have with your health care provider.   Document Released: 02/07/2004 Document Revised: 02/05/2014 Document Reviewed: 06/25/2013 Elsevier Interactive Patient Education 2016 [place] A mammogram is an X-ray of the breasts that is done to check for abnormal changes. This procedure can screen for and detect any changes that may suggest breast cancer. A mammogram can also identify other changes and variations in the breast, such as:  Inflammation of the breast tissue (mastitis).  An infected area that contains a collection of pus (abscess).  A fluid-filled sac (cyst).  Fibrocystic changes. This is when breast tissue becomes denser, which can make the tissue feel rope-like or uneven under the skin.  Tumors that are not cancerous (benign). LET [facility] CARE PROVIDER KNOW ABOUT:  Any allergies you have.  If you have breast implants.  If you have had previous breast disease, biopsy, or surgery.  If you are breastfeeding.  Any possibility that you could be pregnant, if this applies.  If you are younger than age 72.  If you have a family history of breast cancer. RISKS AND COMPLICATIONS Generally, this is a safe procedure. However, problems may occur, including:  Exposure to radiation. Radiation levels are very low with this test.  The results  being misinterpreted.  The need for further tests.  The inability of the mammogram to detect certain cancers. BEFORE THE PROCEDURE  Schedule your test about 1-2 weeks after your menstrual period. This is usually when your breasts are the least tender.  If you have had a mammogram done at a different facility in the past, get the mammogram X-rays or have them sent  to your current exam facility in order to compare them.  Wash your breasts and under your arms the day of the test.  Do not wear deodorants, perfumes, lotions, or powders anywhere on your body on the day of the test.  Remove any jewelry from your neck.  Wear clothes that you can change into and out of easily. PROCEDURE  You will undress from the waist up and put on a gown.  You will stand in front of the X-ray machine.  Each breast will be placed between two plastic or glass plates. The plates will compress your breast for a few seconds. Try to stay as relaxed as possible during the procedure. This does not cause any harm to your breasts and any discomfort you feel will be very brief.  X-rays will be taken from different angles of each breast. The procedure may vary among health care providers and hospitals. AFTER THE PROCEDURE  The mammogram will be examined by a specialist (radiologist).  You may need to repeat certain parts of the test, depending on the quality of the images. This is commonly done if the radiologist needs a better view of the breast tissue.  Ask when your test results will be ready. Make sure you get your test results.  You may resume your normal activities.   This information is not intended to replace advice given to you by your health care provider. Make sure you discuss any questions you have with your health care provider.   Document Released: 01/13/2000 Document Revised: 10/06/2014 Document Reviewed: 03/26/2014 Elsevier Interactive Patient Education [employer].

## 2015-02-14 NOTE — Progress Notes (Signed)
Subjective:   Kaitlin Dean is a 70 y.o. female who presents for an Initial Medicare Annual Wellness Visit.  Review of Systems    No ROS.  Medicare Wellness Visit.  Cardiac Risk Factors include: hypertension     Objective:    Today's Vitals   02/14/15 0906  BP: 118/80  Pulse: 72  Temp: 97.7 F (36.5 C)  TempSrc: Oral  Resp: 14  Height: 5\' 3"  (1.6 m)  Weight: 195 lb 6.4 oz (88.633 kg)  SpO2: 97%    Current Medications (verified) Outpatient Encounter Prescriptions as of 02/14/2015  Medication Sig  . aspirin EC 81 MG tablet Take 81 mg by mouth daily. PM  . buPROPion (WELLBUTRIN SR) 150 MG 12 hr tablet TAKE ONE TABLET BY MOUTH EVERY MORNING  . Multiple Vitamin (MULTIVITAMIN) capsule Take 1 capsule by mouth daily. AM  . omeprazole (PRILOSEC) 20 MG capsule Take 20 mg by mouth daily. AM  . telmisartan (MICARDIS) 40 MG tablet TAKE ONE TABLET BY MOUTH EVERY MORNING  . VYTORIN 10-20 MG tablet TAKE ONE TABLET AT BEDTIME  . albuterol (PROVENTIL HFA;VENTOLIN HFA) 108 (90 BASE) MCG/ACT inhaler Inhale 2 puffs into the lungs every 6 (six) hours as needed for wheezing or shortness of breath. (Patient not taking: Reported on 02/14/2015)  . [DISCONTINUED] benzonatate (TESSALON) 100 MG capsule Take 1 capsule (100 mg total) by mouth 3 (three) times daily as needed.  . [DISCONTINUED] HYDROcodone-homatropine (HYCODAN) 5-1.5 MG/5ML syrup Take 5 mLs by mouth every 8 (eight) hours as needed for cough.   No facility-administered encounter medications on file as of 02/14/2015.    Allergies (verified) Flagyl   History: Past Medical History  Diagnosis Date  . Hypertension   . Hypercholesterolemia   . GERD (gastroesophageal reflux disease)   . 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 Barrett's esophagus   . Chicken pox   . Complication of anesthesia     woke up 15 years ago and felt like could not breathe - no complications   . Diverticulosis of colon without hemorrhage 02/28/2014    . Pneumonia     in past  . History of hiatal hernia   . 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 Osteoporosis   . Skin cancer    Past Surgical History  Procedure Laterality Date  . Tubal ligation    . Breast surgery  2000    Biopsy  . 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 Joint replacement Left     knee  . Colonoscopy    . Skin cancer excision    . Esophagogastroduodenoscopy    . Ptosis repair Bilateral 06/01/2014    Procedure: PTOSIS REPAIR;  Surgeon: Karle Starch, MD;  Location: [place];  Service: Ophthalmology;  Laterality: Bilateral;   Family History  Problem Relation Age of Onset  . Hypertension Mother   . Congestive Heart Failure Mother   . Stroke Father   . Stroke Brother   . Heart failure Brother   . Sarcoidosis Sister     died of kidney failure  . Breast cancer Neg Hx   . Colon cancer Neg Hx    Social History   Occupational History  . Not on file.   Social History Main Topics  . Smoking status: Never Smoker   . Smokeless tobacco: Never  Used  . Alcohol Use: 0.6 oz/week    1 Glasses of wine, 0 Standard drinks or equivalent per week     Comment: occasional  . Drug Use: No  . Sexual Activity: No    Tobacco Counseling Counseling given: Not Answered   Activities of Daily Living In your present state of health, do you have any difficulty performing the following activities: 02/14/2015  Hearing? N  Vision? N  Difficulty concentrating or making decisions? N  Walking or climbing stairs? N  Dressing or bathing? N  Doing errands, shopping? N  Preparing Food and eating ? N  Using the Toilet? N  In the past six months, have you accidently leaked urine? N  Do you have problems with loss of bowel control? Y  Managing your Medications? N  Managing your Finances? N  Housekeeping or managing your Housekeeping? N     Immunizations and Health Maintenance Immunization History  Administered Date(s) Administered  . Influenza Split 10/30/2011, 11/14/2012  . Influenza,inj,Quad PF,36+ Mos 11/12/2013, 01/18/2015  . Pneumococcal Conjugate-13 08/27/2013   Health Maintenance Due  Topic Date Due  . DEXA SCAN  04/06/2008  . MAMMOGRAM  12/05/2014    Patient Care Team: Einar Pheasant, MD as PCP - General (Internal Medicine)  Indicate any recent Medical Services you may have received from other than [facility] providers in the past year (date may be approximate).     Assessment:   This is a routine wellness examination for Livi.  The goal of the wellness visit is to assist the patient how to close the gaps in care and create a preventative care plan for the patient.   Osteoporosis risk reviewed.  Medications reviewed; taking without issues or barriers.  Safety issues reviewed; smoke detectors in the home. No firearms in the home. Wears seatbelts when driving or riding with others. No violence in the home.  No identified risk were noted; The patient was oriented x 3; appropriate in dress and manner and no objective failures at ADL's or IADL's.   Labs completed today: Hep C Screening. Mammogram and Bone Density orders placed.  Patient Concerns:  Referral request for ENT; complaints of seeing some blood in mucus with recent cough, only wants to see Dr Kathyrn Sheriff.  Deferred to follow up with PCP.  Request for fasting labs to be placed prior to 6 month appointment.  Hearing/Vision screen Hearing Screening Comments: Passes the whisper test. Vision Screening Comments: Followed by [facility], Dr. Jeni Salles Annual visits Wears reading glasses only  Dietary issues and exercise activities discussed: Current Exercise Habits:: Structured exercise class (Water aerobics 2 times a week), Type of exercise: walking, Frequency (Times/Week): 6, Intensity: Mild  Goals    . Healthy Lifestyle     Drink more  water daily. Continue exercise regiment. Make healthy food choices.      Depression Screen PHQ 2/9 Scores 02/14/2015 09/01/2014 08/27/2013 04/24/2013 04/08/2012  PHQ - 2 Score 0 0 0 0 0    Fall Risk Fall Risk  02/14/2015 09/01/2014 08/27/2013 04/24/2013 04/08/2012  Falls in the past year? No No No No No    Cognitive Function: MMSE - Mini Mental State Exam 02/14/2015  Orientation to time 5  Orientation to Place 5  Registration 3  Attention/ Calculation 5  Recall 3  Language- name 2 objects 2  Language- repeat 1  Language- follow 3 step command 3  Language- read & follow direction 1  Write a sentence 1  Copy design 1  Total score  30    Screening Tests Health Maintenance  Topic Date Due  . DEXA SCAN  04/06/2008  . MAMMOGRAM  12/05/2014  . INFLUENZA VACCINE  08/30/2015  . TETANUS/TDAP  01/30/2020  . COLONOSCOPY  11/24/2023  . ZOSTAVAX  Addressed  . Hepatitis C Screening  Completed      Plan:   End of life planning; Advance aging; Advanced directives discussed. Copy requested of current HCPOA/Living Will.   Return for 6 month follow up.  During the course of the visit, Hiya was educated and counseled about the following appropriate screening and preventive services:   Vaccines to include Pneumoccal, Influenza, Hepatitis B, Td, Zostavax, HCV  Electrocardiogram  Cardiovascular disease screening  Colorectal cancer screening  Bone density screening  Diabetes screening  Glaucoma screening  Mammography/PAP  Nutrition counseling  Smoking cessation counseling  Patient Instructions (the written plan) were given to the patient.    Varney Biles, LPN   [id number]    Information reviewed.  Agree with plan.   Dr Nicki Reaper

## 2015-02-15 ENCOUNTER — Encounter: Payer: Self-pay | Admitting: Internal Medicine

## 2015-02-15 ENCOUNTER — Ambulatory Visit (INDEPENDENT_AMBULATORY_CARE_PROVIDER_SITE_OTHER): Payer: Medicare Other | Admitting: Internal Medicine

## 2015-02-15 ENCOUNTER — Encounter: Payer: Self-pay | Admitting: *Deleted

## 2015-02-15 VITALS — BP 120/80 | HR 68 | Temp 98.0°F | Resp 18 | Ht 63.0 in | Wt 197.5 lb

## 2015-02-15 DIAGNOSIS — I10 Essential (primary) hypertension: Secondary | ICD-10-CM | POA: Diagnosis not present

## 2015-02-15 DIAGNOSIS — K219 Gastro-esophageal reflux disease without esophagitis: Secondary | ICD-10-CM

## 2015-02-15 DIAGNOSIS — J392 Other diseases of pharynx: Secondary | ICD-10-CM | POA: Diagnosis not present

## 2015-02-15 LAB — HEPATITIS C ANTIBODY: HCV Ab: NEGATIVE

## 2015-02-15 NOTE — Progress Notes (Signed)
Patient ID: Kaitlin Dean, female   DOB: 04-12-1945, 70 y.o.   MRN: [id number]   Subjective:    Patient ID: Kaitlin Dean, female    DOB: 02/19/1945, 70 y.o.   MRN: [id number]  HPI  Patient with past history of GERD, Barrett's esophagus, hypertension and hypercholesterolemia.  She comes in today as a work in with concerns regarding throat sensitivity.  States 6 weeks ago, she developed increased cough with green mucus production, etc.  Noticed some blood tinged mucus.  Was evaluated here.  Given cough medication.  Symptoms worsened.  Had abx for dental procedure.  Took amoxicillin.  Symptoms improved.  States her throat is still sensitive.  Increased cough when more sensitive.  Taking prilosec.  No dysphagia.  Has seen GI.  Last EGD 02/17/13.   Pathology c/w Barrett's.   Request referral to Dr Kaitlin Dean for evaluation given persistent throat sensitivity.     Past Medical History  Diagnosis Date  . Hypertension   . Hypercholesterolemia   . GERD (gastroesophageal reflux disease)   . 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 Barrett's esophagus   . Chicken pox   . Complication of anesthesia     woke up 15 years ago and felt like could not breathe - no complications   . Diverticulosis of colon without hemorrhage 02/28/2014  . Pneumonia     in past  . History of hiatal hernia   . 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 Osteoporosis   . Skin cancer    Past Surgical History  Procedure Laterality Date  . Tubal ligation    . Breast surgery  2000    Biopsy  . 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 Joint replacement Left     knee  . Colonoscopy    . Skin cancer excision    . Esophagogastroduodenoscopy    . Ptosis repair Bilateral 06/01/2014    Procedure: PTOSIS REPAIR;  Surgeon: Kaitlin Starch, MD;  Location: [place];  Service: Ophthalmology;   Laterality: Bilateral;   Family History  Problem Relation Age of Onset  . Hypertension Mother   . Congestive Heart Failure Mother   . Stroke Father   . Stroke Brother   . Heart failure Brother   . Sarcoidosis Sister     died of kidney failure  . Breast cancer Neg Hx   . Colon cancer Neg Hx    Social History   Social History  . Marital Status: Widowed    Spouse Name: N/A  . Number of Children: 2  . Years of Education: N/A   Social History Main Topics  . Smoking status: Never Smoker   . Smokeless tobacco: Never Used  . Alcohol Use: 0.6 oz/week    1 Glasses of wine, 0 Standard drinks or equivalent per week     Comment: occasional  . Drug Use: No  . Sexual Activity: No   Other Topics Concern  . None   Social History Narrative    Outpatient Encounter Prescriptions as of 02/15/2015  Medication Sig  . aspirin EC 81 MG tablet Take 81 mg by mouth daily. PM  . buPROPion (WELLBUTRIN SR) 150 MG 12 hr tablet TAKE ONE TABLET BY MOUTH EVERY MORNING  . Multiple Vitamin (MULTIVITAMIN) capsule Take 1 capsule by mouth daily. AM  . omeprazole (  PRILOSEC) 20 MG capsule Take 20 mg by mouth daily. AM  . telmisartan (MICARDIS) 40 MG tablet TAKE ONE TABLET BY MOUTH EVERY MORNING  . VYTORIN 10-20 MG tablet TAKE ONE TABLET AT BEDTIME  . [DISCONTINUED] albuterol (PROVENTIL HFA;VENTOLIN HFA) 108 (90 BASE) MCG/ACT inhaler Inhale 2 puffs into the lungs every 6 (six) hours as needed for wheezing or shortness of breath. (Patient not taking: Reported on 02/14/2015)   No facility-administered encounter medications on file as of 02/15/2015.    Review of Systems  Constitutional: Negative for appetite change and unexpected weight change.  HENT: Negative for congestion and sinus pressure.        Throat sensitivity.    Respiratory: Negative for cough, chest tightness and shortness of breath.   Cardiovascular: Negative for chest pain.  Gastrointestinal: Negative for nausea, vomiting, abdominal pain and  diarrhea.  Musculoskeletal: Negative for back pain.  Neurological: Negative for dizziness and headaches.       Objective:    Physical Exam  Constitutional: She appears well-developed and well-nourished. No distress.  HENT:  Nose: Nose normal.  Mouth/Throat: Oropharynx is clear and moist.  Neck: Neck supple.  Cardiovascular: Normal rate and regular rhythm.   Pulmonary/Chest: Breath sounds normal. No respiratory distress. She has no wheezes.  Abdominal: Soft. Bowel sounds are normal. There is no tenderness.  Musculoskeletal: She exhibits no edema or tenderness.  Lymphadenopathy:    She has no cervical adenopathy.    BP 120/80 mmHg  Pulse 68  Temp(Src) 98 F (36.7 C) (Oral)  Resp 18  Ht 5\' 3"  (1.6 m)  Wt 197 lb 8 oz (89.585 kg)  BMI 34.99 kg/m2  SpO2 97%  LMP 01/29/1993 Wt Readings from Last 3 Encounters:  02/15/15 197 lb 8 oz (89.585 kg)  02/14/15 195 lb 6.4 oz (88.633 kg)  12/31/14 193 lb 2 oz (87.601 kg)     Lab Results  Component Value Date   WBC 4.5 09/01/2014   HGB 14.6 09/01/2014   HCT 43.0 09/01/2014   PLT 226.0 09/01/2014   GLUCOSE 112* 09/01/2014   CHOL 146 09/01/2014   TRIG 101.0 09/01/2014   HDL 42.80 09/01/2014   LDLDIRECT 182.4 02/05/2012   LDLCALC 83 09/01/2014   ALT 23 09/01/2014   AST 23 09/01/2014   NA 141 09/01/2014   K 4.2 09/01/2014   CL 107 09/01/2014   CREATININE 0.90 09/01/2014   BUN 18 09/01/2014   CO2 25 09/01/2014   TSH 3.21 09/01/2014   INR 0.99 04/22/2012   HGBA1C 5.7 09/01/2014       Assessment & Plan:   Problem List Items Addressed This Visit    GERD (gastroesophageal reflux disease)    EGD 02/17/13 - esophageal mucosal changes secondary to established segment Barrett's disease and hiatal hernia.  Pathology c/w Barretts.  Recommended f/u EGD in 01/2016.        Relevant Orders   Ambulatory referral to ENT   Hypertension    Blood pressure has been doing well.  Same medication regimen.  Follow.       Throat  irritation - Primary    Persistent throat irritation as outlined.  Increased cough with irritation.  Lungs clear.  On prilosec.  Wants referral to Dr Kaitlin Dean for evaluation.        Relevant Orders   Ambulatory referral to ENT       Kaitlin Pheasant, MD

## 2015-02-15 NOTE — Progress Notes (Signed)
Pre-visit discussion using our clinic review tool. No additional management support is needed unless otherwise documented below in the visit note.  

## 2015-02-20 ENCOUNTER — Encounter: Payer: Self-pay | Admitting: Internal Medicine

## 2015-02-20 DIAGNOSIS — J392 Other diseases of pharynx: Secondary | ICD-10-CM | POA: Insufficient documentation

## 2015-02-20 NOTE — Assessment & Plan Note (Signed)
Blood pressure has been doing well.  Same medication regimen.  Follow.  

## 2015-02-20 NOTE — Assessment & Plan Note (Signed)
Persistent throat irritation as outlined.  Increased cough with irritation.  Lungs clear.  On prilosec.  Wants referral to Dr Kathyrn Sheriff for evaluation.

## 2015-02-20 NOTE — Assessment & Plan Note (Signed)
EGD 02/17/13 - esophageal mucosal changes secondary to established segment Barrett's disease and hiatal hernia.  Pathology c/w Barretts.  Recommended f/u EGD in 01/2016.

## 2015-02-23 ENCOUNTER — Encounter: Payer: Self-pay | Admitting: Internal Medicine

## 2015-03-01 ENCOUNTER — Ambulatory Visit
Admission: RE | Admit: 2015-03-01 | Discharge: 2015-03-01 | Disposition: A | Discharge status: Home / Self Care | Payer: Medicare Other | Source: Ambulatory Visit | Attending: Internal Medicine | Admitting: Internal Medicine

## 2015-03-01 ENCOUNTER — Other Ambulatory Visit: Payer: Self-pay | Admitting: Internal Medicine

## 2015-03-01 DIAGNOSIS — Z1231 Encounter for screening mammogram for malignant neoplasm of breast: Secondary | ICD-10-CM | POA: Diagnosis present

## 2015-03-01 DIAGNOSIS — M8580 Other specified disorders of bone density and structure, unspecified site: Secondary | ICD-10-CM | POA: Insufficient documentation

## 2015-03-01 DIAGNOSIS — Z13820 Encounter for screening for osteoporosis: Secondary | ICD-10-CM | POA: Diagnosis present

## 2015-03-01 DIAGNOSIS — E2839 Other primary ovarian failure: Secondary | ICD-10-CM

## 2015-03-01 DIAGNOSIS — Z1239 Encounter for other screening for malignant neoplasm of breast: Secondary | ICD-10-CM

## 2015-03-02 ENCOUNTER — Encounter: Payer: Self-pay | Admitting: *Deleted

## 2015-04-01 ENCOUNTER — Other Ambulatory Visit (INDEPENDENT_AMBULATORY_CARE_PROVIDER_SITE_OTHER): Payer: Medicare Other

## 2015-04-01 ENCOUNTER — Other Ambulatory Visit: Payer: Self-pay | Admitting: Internal Medicine

## 2015-04-01 ENCOUNTER — Telehealth: Payer: Self-pay | Admitting: *Deleted

## 2015-04-01 DIAGNOSIS — R739 Hyperglycemia, unspecified: Secondary | ICD-10-CM | POA: Diagnosis not present

## 2015-04-01 DIAGNOSIS — I10 Essential (primary) hypertension: Secondary | ICD-10-CM

## 2015-04-01 DIAGNOSIS — E7800 Pure hypercholesterolemia, unspecified: Secondary | ICD-10-CM | POA: Diagnosis not present

## 2015-04-01 LAB — LIPID PANEL
CHOLESTEROL: 166 mg/dL (ref 0–200)
HDL: 40.5 mg/dL (ref >39.00)
LDL CALC: 92 mg/dL (ref 0–99)
NONHDL: 125.57
Total CHOL/HDL Ratio: 4
Triglycerides: 167 mg/dL — ABNORMAL HIGH (ref 0.0–149.0)
VLDL: 33.4 mg/dL (ref 0.0–40.0)

## 2015-04-01 LAB — HEPATIC FUNCTION PANEL
ALT: 23 U/L (ref 0–35)
AST: 22 U/L (ref 0–37)
Albumin: 4.4 g/dL (ref 3.5–5.2)
Alkaline Phosphatase: 90 U/L (ref 39–117)
BILIRUBIN DIRECT: 0.1 mg/dL (ref 0.0–0.3)
BILIRUBIN TOTAL: 0.8 mg/dL (ref 0.2–1.2)
Total Protein: 6.6 g/dL (ref 6.0–8.3)

## 2015-04-01 LAB — BASIC METABOLIC PANEL
BUN: 23 mg/dL (ref 6–23)
CALCIUM: 9.8 mg/dL (ref 8.4–10.5)
CHLORIDE: 105 meq/L (ref 96–112)
CO2: 26 meq/L (ref 19–32)
Creatinine, Ser: 0.94 mg/dL (ref 0.40–1.20)
GFR: 62.22 mL/min (ref >60.00)
GLUCOSE: 117 mg/dL — AB (ref 70–99)
Potassium: 4.4 mEq/L (ref 3.5–5.1)
SODIUM: 139 meq/L (ref 135–145)

## 2015-04-01 LAB — HEMOGLOBIN A1C: Hgb A1c MFr Bld: 6 % (ref 4.6–6.5)

## 2015-04-01 NOTE — Telephone Encounter (Signed)
Orders placed for labs

## 2015-04-01 NOTE — Telephone Encounter (Signed)
Pt came in for labs this morning (fasting). Need orders placed

## 2015-04-05 ENCOUNTER — Encounter: Payer: Self-pay | Admitting: Internal Medicine

## 2015-04-05 ENCOUNTER — Ambulatory Visit (INDEPENDENT_AMBULATORY_CARE_PROVIDER_SITE_OTHER): Payer: Medicare Other | Admitting: Internal Medicine

## 2015-04-05 VITALS — BP 110/80 | HR 77 | Temp 98.2°F | Resp 18 | Ht 63.0 in | Wt 198.2 lb

## 2015-04-05 DIAGNOSIS — K219 Gastro-esophageal reflux disease without esophagitis: Secondary | ICD-10-CM | POA: Diagnosis not present

## 2015-04-05 DIAGNOSIS — M1710 Unilateral primary osteoarthritis, unspecified knee: Secondary | ICD-10-CM

## 2015-04-05 DIAGNOSIS — E669 Obesity, unspecified: Secondary | ICD-10-CM

## 2015-04-05 DIAGNOSIS — R888 Abnormal findings in other body fluids and substances: Secondary | ICD-10-CM | POA: Diagnosis not present

## 2015-04-05 DIAGNOSIS — E7800 Pure hypercholesterolemia, unspecified: Secondary | ICD-10-CM

## 2015-04-05 DIAGNOSIS — R739 Hyperglycemia, unspecified: Secondary | ICD-10-CM

## 2015-04-05 DIAGNOSIS — I10 Essential (primary) hypertension: Secondary | ICD-10-CM | POA: Diagnosis not present

## 2015-04-05 DIAGNOSIS — IMO0002 Reserved for concepts with insufficient information to code with codable children: Secondary | ICD-10-CM

## 2015-04-05 DIAGNOSIS — M179 Osteoarthritis of knee, unspecified: Secondary | ICD-10-CM | POA: Diagnosis not present

## 2015-04-05 DIAGNOSIS — B977 Papillomavirus as the cause of diseases classified elsewhere: Secondary | ICD-10-CM

## 2015-04-05 NOTE — Progress Notes (Signed)
Patient ID: Kaitlin Dean, female   DOB: 11/13/1945, 70 y.o.   MRN: [id number]   Subjective:    Patient ID: Kaitlin Dean, female    DOB: 07/04/1945, 70 y.o.   MRN: [id number]  HPI  Patient with past history of hypercholesterolemia, GERD and hypertension.  She comes in today for a scheduled follow up.  She is off wellbutrin.  Has been off for 2-3 months.  Doing well.  No acid reflux symptoms reported.  Tries to stay active.  No cardiac symptoms with increased activity or exertion.  No sob.  No abdominal pain or cramping.  Bowels stable.     Past Medical History  Diagnosis Date  . Hypertension   . Hypercholesterolemia   . GERD (gastroesophageal reflux disease)   . 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 Barrett's esophagus   . Chicken pox   . Complication of anesthesia     woke up 15 years ago and felt like could not breathe - no complications   . Diverticulosis of colon without hemorrhage 02/28/2014  . Pneumonia     in past  . History of hiatal hernia   . 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 Osteoporosis   . Skin cancer    Past Surgical History  Procedure Laterality Date  . Tubal ligation    . Breast surgery  2000    Biopsy  . Total knee arthroplasty Left 04/30/2012    Procedure: TOTAL KNEE ARTHROPLASTY;  Surgeon: Frank V Aluisio, MD;  Location: [facility];  Service: Orthopedics;  Laterality: Left;  . Joint replacement Left     knee  . Colonoscopy    . Skin cancer excision    . Esophagogastroduodenoscopy    . Ptosis repair Bilateral 06/01/2014    Procedure: PTOSIS REPAIR;  Surgeon: Amy M Fowler, MD;  Location: [facility];  Service: Ophthalmology;  Laterality: Bilateral;  . Breast biopsy Right 2000   Family History  Problem Relation Age of Onset  . Hypertension Mother   . Congestive Heart Failure Mother   . Stroke Father   . Stroke Brother   . Heart failure Brother   . Sarcoidosis Sister     died of kidney failure  . Breast cancer Neg Hx   . Colon  cancer Neg Hx    Social History   Social History  . Marital Status: Widowed    Spouse Name: N/A  . Number of Children: 2  . Years of Education: N/A   Social History Main Topics  . Smoking status: Never Smoker   . Smokeless tobacco: Never Used  . Alcohol Use: 0.6 oz/week    1 Glasses of wine, 0 Standard drinks or equivalent per week     Comment: occasional  . Drug Use: No  . Sexual Activity: No   Other Topics Concern  . None   Social History Narrative     Review of Systems     Objective:    Physical Exam  BP 110/80 mmHg  Pulse 77  Temp(Src) 98.2 F (36.8 C) (Oral)  Resp 18  Ht 5' 3" (1.6 m)  Wt 198 lb 4 oz (89.926 kg)  BMI 35.13 kg/m2  SpO2 98%  LMP 01/29/1993 Wt Readings from Last 3 Encounters:  04/05/15 198 lb 4 oz (89.926 kg)  02/15/15 197 lb 8 oz (89.585 kg)  02/14/15 195 lb 6.4 oz (88.633 kg)     Lab Results  Component Value Date     WBC 4.5 09/01/2014   HGB 14.6 09/01/2014   HCT 43.0 09/01/2014   PLT 226.0 09/01/2014   GLUCOSE 117* 04/01/2015   CHOL 166 04/01/2015   TRIG 167.0* 04/01/2015   HDL 40.50 04/01/2015   LDLDIRECT 182.4 02/05/2012   LDLCALC 92 04/01/2015   ALT 23 04/01/2015   AST 22 04/01/2015   NA 139 04/01/2015   K 4.4 04/01/2015   CL 105 04/01/2015   CREATININE 0.94 04/01/2015   BUN 23 04/01/2015   CO2 26 04/01/2015   TSH 3.21 09/01/2014   INR 0.99 04/22/2012   HGBA1C 6.0 04/01/2015       Assessment & Plan:   Problem List Items Addressed This Visit    GERD (gastroesophageal reflux disease)    EGD 02/17/13 - as outlined in overview.  Recommended f/u EGD in 01/2016.        HPV test positive - Primary    Evaluated 2/29/1.  See overview.  S/p colposcopy.  Recommended f/u in one year.  Refer back to GI.        Relevant Orders   Ambulatory referral to Gynecology   Hypercholesterolemia    Low cholesterol diet and exercise.  Follow lipid panel.        Hyperglycemia    Low carb diet and exercise.  Follow met b and a1c.         Hypertension    Blood pressure under good control.  Continue same medication regimen.  Follow pressures.  Follow metabolic panel.        OA (osteoarthritis) of knee    S/p knee surgery and doing well.  Follow.        Obesity (BMI 30-39.9)    Discussed diet and exercise.            Einar Pheasant, MD

## 2015-04-05 NOTE — Progress Notes (Signed)
Pre-visit discussion using our clinic review tool. No additional management support is needed unless otherwise documented below in the visit note.  

## 2015-04-10 ENCOUNTER — Encounter: Payer: Self-pay | Admitting: Internal Medicine

## 2015-04-10 NOTE — Assessment & Plan Note (Signed)
S/p knee surgery and doing well.  Follow.

## 2015-04-10 NOTE — Assessment & Plan Note (Signed)
EGD 02/17/13 - as outlined in overview.  Recommended f/u EGD in 01/2016.

## 2015-04-10 NOTE — Assessment & Plan Note (Signed)
Evaluated 2/29/1.  See overview.  S/p colposcopy.  Recommended f/u in one year.  Refer back to GI.

## 2015-04-10 NOTE — Assessment & Plan Note (Signed)
Discussed diet and exercise 

## 2015-04-10 NOTE — Assessment & Plan Note (Signed)
Blood pressure under good control.  Continue same medication regimen.  Follow pressures.  Follow metabolic panel.   

## 2015-04-10 NOTE — Assessment & Plan Note (Signed)
Low carb diet and exercise.  Follow met b and a1c.

## 2015-04-10 NOTE — Assessment & Plan Note (Signed)
Low cholesterol diet and exercise.  Follow lipid panel.   

## 2015-05-07 ENCOUNTER — Other Ambulatory Visit: Payer: Self-pay | Admitting: Internal Medicine

## 2015-06-28 ENCOUNTER — Telehealth: Payer: Self-pay | Admitting: *Deleted

## 2015-06-28 ENCOUNTER — Other Ambulatory Visit: Payer: Self-pay | Admitting: Internal Medicine

## 2015-06-28 DIAGNOSIS — M79673 Pain in unspecified foot: Secondary | ICD-10-CM

## 2015-06-28 NOTE — Telephone Encounter (Signed)
Spoke with patient about heel pain. Intermittent pain in Left heel onset 1 week, started when patient started walking around neighborhood. Although it has been off and on she states it was very painful last night and this morning could hardly walk. Patient is requesting referral to podiatrist.

## 2015-06-28 NOTE — Progress Notes (Signed)
Order placed for podiatry referral.   

## 2015-06-28 NOTE — Telephone Encounter (Signed)
See phone note.  I placed order for podiatry referral.  Thanks

## 2015-06-28 NOTE — Telephone Encounter (Signed)
Patient stated that she's having trouble with her left foot. She has pain in the heel with weight. She requested a referral to a pediatrist.

## 2015-07-14 ENCOUNTER — Ambulatory Visit: Payer: Self-pay | Admitting: Podiatry

## 2015-08-11 ENCOUNTER — Encounter: Payer: Self-pay | Admitting: Internal Medicine

## 2015-08-11 ENCOUNTER — Ambulatory Visit (INDEPENDENT_AMBULATORY_CARE_PROVIDER_SITE_OTHER): Payer: Medicare Other | Admitting: Internal Medicine

## 2015-08-11 VITALS — BP 110/70 | HR 77 | Temp 98.1°F | Resp 18 | Ht 63.0 in | Wt 198.2 lb

## 2015-08-11 DIAGNOSIS — R888 Abnormal findings in other body fluids and substances: Secondary | ICD-10-CM

## 2015-08-11 DIAGNOSIS — I10 Essential (primary) hypertension: Secondary | ICD-10-CM

## 2015-08-11 DIAGNOSIS — Z0000 Encounter for general adult medical examination without abnormal findings: Secondary | ICD-10-CM

## 2015-08-11 DIAGNOSIS — E669 Obesity, unspecified: Secondary | ICD-10-CM | POA: Diagnosis not present

## 2015-08-11 DIAGNOSIS — K2270 Barrett's esophagus without dysplasia: Secondary | ICD-10-CM

## 2015-08-11 DIAGNOSIS — R739 Hyperglycemia, unspecified: Secondary | ICD-10-CM | POA: Diagnosis not present

## 2015-08-11 DIAGNOSIS — B977 Papillomavirus as the cause of diseases classified elsewhere: Secondary | ICD-10-CM

## 2015-08-11 DIAGNOSIS — E7800 Pure hypercholesterolemia, unspecified: Secondary | ICD-10-CM

## 2015-08-11 DIAGNOSIS — IMO0002 Reserved for concepts with insufficient information to code with codable children: Secondary | ICD-10-CM

## 2015-08-11 NOTE — Assessment & Plan Note (Signed)
Low carb diet and exercise.  Follow met b and a1c.   

## 2015-08-11 NOTE — Progress Notes (Signed)
Pre-visit discussion using our clinic review tool. No additional management support is needed unless otherwise documented below in the visit note.  

## 2015-08-11 NOTE — Assessment & Plan Note (Addendum)
Physical today 08/11/15.  Mammogram 03/01/15 - Birads I.  Pelvic and pap smears being followed through gyn.  Overdue.  Last evaluation 03/2014.  She will call and schedule appt.  Colonoscopy 11/23/13 - diverticulosis and internal hemorrhoids.

## 2015-08-11 NOTE — Progress Notes (Signed)
Patient ID: Kaitlin Dean, female   DOB: October 08, 1945, 70 y.o.   MRN: [id number]   Subjective:    Patient ID: Kaitlin Dean, female    DOB: 1945/06/06, 70 y.o.   MRN: [id number]  HPI  Patient here for her physical exam.  States she is doing well. Tries to stay active.  No cardiac symptoms with increased activity or exertion.  No sob.  No acid reflux.  No abdominal pain or cramping.  Bowels stable.  Up to date with colonoscopy.  Overdue f/u with gyn.  Discussed with her regarding need for f/u.     Past Medical History  Diagnosis Date  . Hypertension   . Hypercholesterolemia   . GERD (gastroesophageal reflux disease)   . 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 Barrett's esophagus   . Chicken pox   . Complication of anesthesia     woke up 15 years ago and felt like could not breathe - no complications   . Diverticulosis of colon without hemorrhage 02/28/2014  . Pneumonia     in past  . History of hiatal hernia   . 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 Osteoporosis   . Skin cancer    Past Surgical History  Procedure Laterality Date  . Tubal ligation    . Breast surgery  2000    Biopsy  . 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 Joint replacement Left     knee  . Colonoscopy    . Skin cancer excision    . Esophagogastroduodenoscopy    . Ptosis repair Bilateral 06/01/2014    Procedure: PTOSIS REPAIR;  Surgeon: Karle Starch, MD;  Location: [facility];  Service: Ophthalmology;  Laterality: Bilateral;  . Breast biopsy Right 2000   Family History  Problem Relation Age of Onset  . Hypertension Mother   . Congestive Heart Failure Mother   . Stroke Father   . Stroke Brother   . Heart failure Brother   . Sarcoidosis Sister     died of kidney failure  . Breast cancer Neg Hx   . Colon cancer Neg Hx    Social History   Social  History  . Marital Status: Widowed    Spouse Name: N/A  . Number of Children: 2  . Years of Education: N/A   Social History Main Topics  . Smoking status: Never Smoker   . Smokeless tobacco: Never Used  . Alcohol Use: 0.6 oz/week    1 Glasses of wine, 0 Standard drinks or equivalent per week     Comment: occasional  . Drug Use: No  . Sexual Activity: No   Other Topics Concern  . None   Social History Narrative    Outpatient Encounter Prescriptions as of 08/11/2015  Medication Sig  . aspirin EC 81 MG tablet Take 81 mg by mouth daily. PM  . Multiple Vitamin (MULTIVITAMIN) capsule Take 1 capsule by mouth daily. AM  . omeprazole (PRILOSEC) 20 MG capsule Take 20 mg by mouth daily. AM  . telmisartan (MICARDIS) 40 MG tablet TAKE ONE TABLET BY MOUTH EVERY MORNING  . VYTORIN 10-20 MG tablet TAKE ONE TABLET AT BEDTIME   No facility-administered encounter medications on file as of 08/11/2015.    Review of Systems  Constitutional: Negative for appetite change and unexpected weight change.  HENT: Negative for congestion and sinus pressure.   Eyes: Negative for pain and visual disturbance.  Respiratory: Negative for cough, chest tightness and shortness of breath.   Cardiovascular: Negative for chest pain, palpitations and leg swelling.  Gastrointestinal: Negative for nausea, vomiting, abdominal pain and diarrhea.  Genitourinary: Negative for dysuria and difficulty urinating.  Musculoskeletal: Negative for back pain and joint swelling.  Skin: Negative for color change and rash.  Neurological: Negative for dizziness, light-headedness and headaches.  Hematological: Negative for adenopathy. Does not bruise/bleed easily.  Psychiatric/Behavioral: Negative for dysphoric mood and agitation.       Objective:     Blood pressure rechecked by me:  108/78  Physical Exam  Constitutional: She is oriented to person, place, and time. She appears well-developed and well-nourished. No distress.    HENT:  Nose: Nose normal.  Mouth/Throat: Oropharynx is clear and moist.  Eyes: Right eye exhibits no discharge. Left eye exhibits no discharge. No scleral icterus.  Neck: Neck supple. No thyromegaly present.  Cardiovascular: Normal rate and regular rhythm.   Pulmonary/Chest: Breath sounds normal. No accessory muscle usage. No tachypnea. No respiratory distress. She has no decreased breath sounds. She has no wheezes. She has no rhonchi. Right breast exhibits no inverted nipple, no mass, no nipple discharge and no tenderness (no axillary adenopathy). Left breast exhibits no inverted nipple, no mass, no nipple discharge and no tenderness (no axilarry adenopathy).  Abdominal: Soft. Bowel sounds are normal. There is no tenderness.  Musculoskeletal: She exhibits no edema or tenderness.  Lymphadenopathy:    She has no cervical adenopathy.  Neurological: She is alert and oriented to person, place, and time.  Skin: Skin is warm. No rash noted. No erythema.  Psychiatric: She has a normal mood and affect. Her behavior is normal.    BP 110/70 mmHg  Pulse 77  Temp(Src) 98.1 F (36.7 C) (Oral)  Resp 18  Ht '5\' 3"'  (1.6 m)  Wt 198 lb 4 oz (89.926 kg)  BMI 35.13 kg/m2  SpO2 96%  LMP 01/29/1993 Wt Readings from Last 3 Encounters:  08/11/15 198 lb 4 oz (89.926 kg)  04/05/15 198 lb 4 oz (89.926 kg)  02/15/15 197 lb 8 oz (89.585 kg)     Lab Results  Component Value Date   WBC 4.5 09/01/2014   HGB 14.6 09/01/2014   HCT 43.0 09/01/2014   PLT 226.0 09/01/2014   GLUCOSE 117* 04/01/2015   CHOL 166 04/01/2015   TRIG 167.0* 04/01/2015   HDL 40.50 04/01/2015   LDLDIRECT 182.4 02/05/2012   LDLCALC 92 04/01/2015   ALT 23 04/01/2015   AST 22 04/01/2015   NA 139 04/01/2015   K 4.4 04/01/2015   CL 105 04/01/2015   CREATININE 0.94 04/01/2015   BUN 23 04/01/2015   CO2 26 04/01/2015   TSH 3.21 09/01/2014   INR 0.99 04/22/2012   HGBA1C 6.0 04/01/2015    Dexascan  03/01/2015  EXAM: DUAL X-RAY  ABSORPTIOMETRY (DXA) FOR BONE MINERAL DENSITY IMPRESSION: Dear Dr. Nicki Reaper, Your patient Kaitlin Dean completed a BMD test on 03/01/2015 using the [place] (analysis version: 14.10) manufactured by [employer]. The following summarizes the results of our evaluation. PATIENT BIOGRAPHICAL: Name: Indi, [place]hite Patient ID: [id number] Birth Date: 05/16/1945 Height: 63.0 in. Gender: Female Exam Date: 03/01/2015 Weight: 197.5 lbs. Indications: Caucasian, Height Loss, Osteoarthritis, Postmenopausal, History of Fracture (Adult) Fractures: Pelvis Treatments: Multi-Vitamin with calcium ASSESSMENT: The BMD measured at Femur Neck Left is 0.854 g/cm2 with a T-score  of -1.3. This patient is considered osteopenic according to [facility]) criteria. L-3 was excluded due to degenerative changes. Site Region Measured Measured WHO Young Adult BMD Date       Age      Classification T-score AP Spine L1-L4 (L3) 03/01/2015 71.8 Osteopenia -1.3 1.028 g/cm2 DualFemur Neck Left 03/01/2015 71.8 Osteopenia -1.3 0.854 g/cm2 World Health Organization [facility]) criteria for post-menopausal, Caucasian Women: Normal:       T-score at or above -1 SD Osteopenia:   T-score between -1 and -2.5 SD Osteoporosis: T-score at or below -2.5 SD RECOMMENDATIONS: [place] recommends that FDA-approved medical therapies be considered in postmenopausal women and men age 108 or older with a: 1. Hip or vertebral (clinical or morphometric) fracture. 2. T-score of < -2.5 at the spine or hip. 3. Ten-year fracture probability by FRAX of 3% or greater for hip fracture or 20% or greater for major osteoporotic fracture. All treatment decisions require clinical judgment and consideration of individual patient factors, including patient preferences, co-morbidities, previous drug use, risk factors not captured in the FRAX model (e.g. falls, vitamin D deficiency, increased bone turnover, interval significant decline in bone  density) and possible under - or over-estimation of fracture risk by FRAX. All patients should ensure an adequate intake of dietary calcium (1200 mg/d) and vitamin D (800 IU daily) unless contraindicated. FOLLOW-UP: People with diagnosed cases of osteoporosis or at high risk for fracture should have regular bone mineral density tests. For patients eligible for Medicare, routine testing is allowed once every 2 years. The testing frequency can be increased to one year for patients who have rapidly progressing disease, those who are receiving or discontinuing medical therapy to restore bone mass, or have additional risk factors. I have reviewed this report, and agree with the above findings. [facility] Dear Dr. Nicki Reaper, Your patient Tatanisha Cuthbert Kontz completed a FRAX assessment on 03/01/2015 using the [place] (analysis version: 14.10) manufactured by [employer]. The following summarizes the results of our evaluation. PATIENT BIOGRAPHICAL: Name: Madilynne, Mullan Patient ID: [id number] Birth Date: [birth year] Height:    63.0 in. Gender:     Female    [age]       Weight:    197.5 lbs. Ethnicity:  White                            Exam Date: 03/01/2015 FRAX* RESULTS:  (version: 3.5) 10-year Probability of Fracture1 Major Osteoporotic Fracture2 Hip Fracture 14.6% 1.9% Population: [place] (Caucasian) Risk Factors: History of Fracture (Adult) Based on Femur (Left) Neck BMD 1 -The 10-year probability of fracture may be lower than reported if the patient has received treatment. 2 -Major Osteoporotic Fracture: Clinical Spine, Forearm, Hip or Shoulder *FRAX is a Materials engineer of the [employer] for Metabolic Bone Disease, a [place] (WHO) [facility]. ASSESSMENT: The probability of a major osteoporotic fracture is 14.6% within the next ten years. The probability of a hip fracture is 1.9% within the next ten years. . Electronically Signed   By:  Marin Olp M.D.   On: 03/01/2015 11:06   Mm Screening Breast Tomo Bilateral  03/01/2015  CLINICAL DATA:  Screening. EXAM: DIGITAL SCREENING BILATERAL MAMMOGRAM WITH 3D TOMO WITH CAD COMPARISON:  Previous exam(s). ACR Breast Density Category a: The breast tissue is almost entirely fatty. FINDINGS: There are no findings  suspicious for malignancy. Images were processed with CAD. IMPRESSION: No mammographic evidence of malignancy. A result letter of this screening mammogram will be mailed directly to the patient. RECOMMENDATION: Screening mammogram in one year. (Code:SM-B-01Y) BI-RADS CATEGORY  1: Negative. Electronically Signed   By: Altamese Cabal M.D.   On: 03/01/2015 13:07       Assessment & Plan:   Problem List Items Addressed This Visit    Barrett's esophagus    Had f/u EGD 2015.  Symptoms controlled.        Health care maintenance - Primary    Physical today 08/11/15.  Mammogram 03/01/15 - Birads I.  Pelvic and pap smears being followed through gyn.  Overdue.  Last evaluation 03/2014.  She will call and schedule appt.  Colonoscopy 11/23/13 - diverticulosis and internal hemorrhoids.        HPV test positive    Continue f/u with gyn.  Overdue.  She will call and schedule.        Hypercholesterolemia    On vytorin.  Low cholesterol diet and exercise.  Follow lipid panel and liver function tests.        Hyperglycemia    Low carb diet and exercise.  Follow met b and a1c.        Hypertension    Blood pressure under good control.  Continue same medication regimen.  Follow pressures.  Follow metabolic panel.        Obesity (BMI 30-39.9)    Diet and exercise.  Follow.           Einar Pheasant, MD

## 2015-08-14 ENCOUNTER — Encounter: Payer: Self-pay | Admitting: Internal Medicine

## 2015-08-14 NOTE — Assessment & Plan Note (Signed)
Continue f/u with gyn.  Overdue.  She will call and schedule.

## 2015-08-14 NOTE — Assessment & Plan Note (Signed)
Blood pressure under good control.  Continue same medication regimen.  Follow pressures.  Follow metabolic panel.   

## 2015-08-14 NOTE — Assessment & Plan Note (Signed)
On vytorin.  Low cholesterol diet and exercise.  Follow lipid panel and liver function tests.   

## 2015-08-14 NOTE — Assessment & Plan Note (Signed)
Had f/u EGD 2015.  Symptoms controlled.

## 2015-08-14 NOTE — Assessment & Plan Note (Signed)
Diet and exercise.  Follow.  

## 2015-09-15 ENCOUNTER — Telehealth: Payer: Self-pay

## 2015-09-15 DIAGNOSIS — E7800 Pure hypercholesterolemia, unspecified: Secondary | ICD-10-CM

## 2015-09-15 DIAGNOSIS — R739 Hyperglycemia, unspecified: Secondary | ICD-10-CM

## 2015-09-15 DIAGNOSIS — I10 Essential (primary) hypertension: Secondary | ICD-10-CM

## 2015-09-15 NOTE — Telephone Encounter (Signed)
Orders placed for labs

## 2015-09-15 NOTE — Telephone Encounter (Signed)
Pt coming for fasting labs 09/16/15. Please place future orders. Thank you.

## 2015-09-16 ENCOUNTER — Other Ambulatory Visit (INDEPENDENT_AMBULATORY_CARE_PROVIDER_SITE_OTHER): Payer: Medicare Other

## 2015-09-16 ENCOUNTER — Encounter (INDEPENDENT_AMBULATORY_CARE_PROVIDER_SITE_OTHER): Payer: Self-pay

## 2015-09-16 DIAGNOSIS — R739 Hyperglycemia, unspecified: Secondary | ICD-10-CM | POA: Diagnosis not present

## 2015-09-16 DIAGNOSIS — E7800 Pure hypercholesterolemia, unspecified: Secondary | ICD-10-CM | POA: Diagnosis not present

## 2015-09-16 DIAGNOSIS — I10 Essential (primary) hypertension: Secondary | ICD-10-CM | POA: Diagnosis not present

## 2015-09-16 LAB — BASIC METABOLIC PANEL
BUN: 18 mg/dL (ref 6–23)
CALCIUM: 9.6 mg/dL (ref 8.4–10.5)
CO2: 25 meq/L (ref 19–32)
CREATININE: 0.91 mg/dL (ref 0.40–1.20)
Chloride: 107 mEq/L (ref 96–112)
GFR: 64.51 mL/min (ref >60.00)
Glucose, Bld: 123 mg/dL — ABNORMAL HIGH (ref 70–99)
Potassium: 3.9 mEq/L (ref 3.5–5.1)
Sodium: 141 mEq/L (ref 135–145)

## 2015-09-16 LAB — CBC WITH DIFFERENTIAL/PLATELET
BASOS ABS: 0 10*3/uL (ref 0.0–0.1)
Basophils Relative: 0.5 % (ref 0.0–3.0)
EOS ABS: 0.2 10*3/uL (ref 0.0–0.7)
EOS PCT: 3.2 % (ref 0.0–5.0)
HCT: 43.4 % (ref 36.0–46.0)
HEMOGLOBIN: 14.7 g/dL (ref 12.0–15.0)
Lymphocytes Relative: 32.2 % (ref 12.0–46.0)
Lymphs Abs: 1.7 10*3/uL (ref 0.7–4.0)
MCHC: 33.9 g/dL (ref 30.0–36.0)
MCV: 89.7 fl (ref 78.0–100.0)
MONO ABS: 0.4 10*3/uL (ref 0.1–1.0)
Monocytes Relative: 7.4 % (ref 3.0–12.0)
Neutro Abs: 3 10*3/uL (ref 1.4–7.7)
Neutrophils Relative %: 56.7 % (ref 43.0–77.0)
Platelets: 260 10*3/uL (ref 150.0–400.0)
RBC: 4.84 Mil/uL (ref 3.87–5.11)
RDW: 13.3 % (ref 11.5–15.5)
WBC: 5.4 10*3/uL (ref 4.0–10.5)

## 2015-09-16 LAB — HEMOGLOBIN A1C: HEMOGLOBIN A1C: 5.9 % (ref 4.6–6.5)

## 2015-09-16 LAB — LIPID PANEL
CHOLESTEROL: 152 mg/dL (ref 0–200)
HDL: 41.4 mg/dL (ref >39.00)
LDL Cholesterol: 85 mg/dL (ref 0–99)
NonHDL: 110.18
Total CHOL/HDL Ratio: 4
Triglycerides: 128 mg/dL (ref 0.0–149.0)
VLDL: 25.6 mg/dL (ref 0.0–40.0)

## 2015-09-16 LAB — HEPATIC FUNCTION PANEL
ALBUMIN: 4.4 g/dL (ref 3.5–5.2)
ALK PHOS: 99 U/L (ref 39–117)
ALT: 22 U/L (ref 0–35)
AST: 20 U/L (ref 0–37)
Bilirubin, Direct: 0.2 mg/dL (ref 0.0–0.3)
Total Bilirubin: 0.9 mg/dL (ref 0.2–1.2)
Total Protein: 7 g/dL (ref 6.0–8.3)

## 2015-09-16 LAB — TSH: TSH: 4.01 u[IU]/mL (ref 0.35–4.50)

## 2015-09-19 ENCOUNTER — Encounter: Payer: Self-pay | Admitting: Internal Medicine

## 2015-09-23 NOTE — Telephone Encounter (Signed)
Unread mychart message mailed to patient 

## 2015-10-18 ENCOUNTER — Other Ambulatory Visit: Payer: Self-pay | Admitting: Internal Medicine

## 2015-11-01 ENCOUNTER — Ambulatory Visit (INDEPENDENT_AMBULATORY_CARE_PROVIDER_SITE_OTHER): Payer: Medicare Other

## 2015-11-01 DIAGNOSIS — Z23 Encounter for immunization: Secondary | ICD-10-CM | POA: Diagnosis not present

## 2015-11-01 NOTE — Progress Notes (Signed)
Patient received flu shot 

## 2015-11-07 ENCOUNTER — Telehealth: Payer: Self-pay | Admitting: Internal Medicine

## 2015-11-07 NOTE — Telephone Encounter (Signed)
Pt called and stated that she is having severe stomach pains on the left side. The pain is reminiscent of when pt had diverticulitis. Pt would like to know if Dr. Nicki Reaper could prescribe something or does she needs to come in for an appt?  Please advise, thank you!  Call pt [phone]

## 2015-11-07 NOTE — Telephone Encounter (Signed)
-----   Message from Einar Pheasant, MD sent at 11/07/2015  4:28 PM EDT ----- Regarding: message There is a message in Tanya's box on this pt.  I had sent to her because I was not sure when you were coming back today.  The patient is having abdominal pain.  Needs to be seen today to confirm diagnosis.  Please call her if Lavella Lemons has not talked with her.  Thanks.    Dr Nicki Reaper

## 2015-11-07 NOTE — Telephone Encounter (Signed)
Left message to notify Dr Nicki Reaper recommends acute care

## 2015-11-07 NOTE — Telephone Encounter (Signed)
If having severe abdominal pain, needs to be evaluated today.  Acute care today.  Thanks

## 2015-11-07 NOTE — Telephone Encounter (Signed)
Please advise 

## 2015-11-07 NOTE — Telephone Encounter (Signed)
Pt called back returning your call.   Call pt @ [phone]

## 2015-11-07 NOTE — Telephone Encounter (Signed)
Patient notified to go to acute care

## 2015-12-08 ENCOUNTER — Telehealth: Payer: Self-pay | Admitting: Internal Medicine

## 2015-12-08 NOTE — Telephone Encounter (Signed)
I called pt and left a vm to call office to sch AWV for 2018. Thank you!

## 2016-02-09 ENCOUNTER — Other Ambulatory Visit: Payer: Self-pay | Admitting: Internal Medicine

## 2016-02-09 DIAGNOSIS — Z1231 Encounter for screening mammogram for malignant neoplasm of breast: Secondary | ICD-10-CM

## 2016-02-13 ENCOUNTER — Other Ambulatory Visit: Payer: Self-pay | Admitting: Internal Medicine

## 2016-02-14 ENCOUNTER — Ambulatory Visit (INDEPENDENT_AMBULATORY_CARE_PROVIDER_SITE_OTHER): Payer: Medicare Other | Admitting: Internal Medicine

## 2016-02-14 ENCOUNTER — Encounter: Payer: Self-pay | Admitting: Internal Medicine

## 2016-02-14 DIAGNOSIS — C4490 Unspecified malignant neoplasm of skin, unspecified: Secondary | ICD-10-CM | POA: Diagnosis not present

## 2016-02-14 DIAGNOSIS — K2270 Barrett's esophagus without dysplasia: Secondary | ICD-10-CM

## 2016-02-14 DIAGNOSIS — E7800 Pure hypercholesterolemia, unspecified: Secondary | ICD-10-CM | POA: Diagnosis not present

## 2016-02-14 DIAGNOSIS — I10 Essential (primary) hypertension: Secondary | ICD-10-CM

## 2016-02-14 DIAGNOSIS — R739 Hyperglycemia, unspecified: Secondary | ICD-10-CM

## 2016-02-14 DIAGNOSIS — Z8619 Personal history of other infectious and parasitic diseases: Secondary | ICD-10-CM

## 2016-02-14 LAB — LIPID PANEL
CHOL/HDL RATIO: 4
Cholesterol: 130 mg/dL (ref 0–200)
HDL: 36.7 mg/dL — AB (ref >39.00)
LDL CALC: 76 mg/dL (ref 0–99)
NONHDL: 93.05
Triglycerides: 84 mg/dL (ref 0.0–149.0)
VLDL: 16.8 mg/dL (ref 0.0–40.0)

## 2016-02-14 LAB — BASIC METABOLIC PANEL
BUN: 18 mg/dL (ref 6–23)
CO2: 25 mEq/L (ref 19–32)
Calcium: 9.7 mg/dL (ref 8.4–10.5)
Chloride: 107 mEq/L (ref 96–112)
Creatinine, Ser: 0.97 mg/dL (ref 0.40–1.20)
GFR: 59.85 mL/min — AB (ref >60.00)
Glucose, Bld: 118 mg/dL — ABNORMAL HIGH (ref 70–99)
POTASSIUM: 4.5 meq/L (ref 3.5–5.1)
SODIUM: 140 meq/L (ref 135–145)

## 2016-02-14 LAB — HEPATIC FUNCTION PANEL
ALBUMIN: 4.4 g/dL (ref 3.5–5.2)
ALT: 23 U/L (ref 0–35)
AST: 25 U/L (ref 0–37)
Alkaline Phosphatase: 86 U/L (ref 39–117)
Bilirubin, Direct: 0.2 mg/dL (ref 0.0–0.3)
Total Bilirubin: 0.9 mg/dL (ref 0.2–1.2)
Total Protein: 6.6 g/dL (ref 6.0–8.3)

## 2016-02-14 LAB — HEMOGLOBIN A1C: HEMOGLOBIN A1C: 5.9 % (ref 4.6–6.5)

## 2016-02-14 NOTE — Assessment & Plan Note (Signed)
Evaluated 03/29/14 - colposcopy.  See overview for results.  Has f/u planned next week with gyn.

## 2016-02-14 NOTE — Progress Notes (Signed)
Pre visit review using our clinic review tool, if applicable. No additional management support is needed unless otherwise documented below in the visit note. 

## 2016-02-14 NOTE — Assessment & Plan Note (Signed)
On vytorin.  Low cholesterol diet and exercise.  Follow lipid panel and liver function tests.   

## 2016-02-14 NOTE — Assessment & Plan Note (Signed)
Blood pressure under good control.  Continue same medication regimen.  Follow pressures.  Follow metabolic panel.   

## 2016-02-14 NOTE — Assessment & Plan Note (Signed)
Low carb diet and exercise.  Follow met b and a1c.   

## 2016-02-14 NOTE — Assessment & Plan Note (Signed)
Scheduled for f/u with GI.

## 2016-02-14 NOTE — Progress Notes (Signed)
Patient ID: Kaitlin Dean, female   DOB: 1945-08-22, 71 y.o.   MRN: [id number]   Subjective:    Patient ID: TABOR Dean, female    DOB: 10-Dec-1945, 71 y.o.   MRN: [id number]  HPI  Patient here for a scheduled follow up.  States she is doing well.  Feels good.  Has been trying to watch her diet better.  No chest pain.  No sob.  No acid reflux.  No abdominal pain or cramping.  Bowels stable.  Has f/u with gyn next week.  Scheduled for her mammogram 03/12/16.  Has appt scheduled with GI for f/u.    Past Medical History:  Diagnosis Date  . 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 Barrett's esophagus   . Chicken pox   . Complication of anesthesia    woke up 15 years ago and felt like could not breathe - no complications   . Diverticulosis of colon without hemorrhage 02/28/2014  . GERD (gastroesophageal reflux disease)   . History of hiatal hernia   . Hypercholesterolemia   . Hypertension   . Osteoporosis   . Pneumonia    in past  . 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 Skin cancer    Past Surgical History:  Procedure Laterality Date  . BREAST BIOPSY Right 2000  . BREAST SURGERY  2000   Biopsy  . COLONOSCOPY    . ESOPHAGOGASTRODUODENOSCOPY    . JOINT REPLACEMENT Left    knee  . PTOSIS REPAIR Bilateral 06/01/2014   Procedure: PTOSIS REPAIR;  Surgeon: Karle Starch, MD;  Location: [facility];  Service: Ophthalmology;  Laterality: Bilateral;  . SKIN CANCER EXCISION    . TOTAL KNEE ARTHROPLASTY Left 04/30/2012   Procedure: TOTAL KNEE ARTHROPLASTY;  Surgeon: Gearlean Alf, MD;  Location: [facility];  Service: Orthopedics;  Laterality: Left;  . TUBAL LIGATION     Family History  Problem Relation Age of Onset  . Hypertension Mother   . Congestive Heart Failure Mother   . Stroke Father   . Stroke Brother   . Heart failure Brother   . Sarcoidosis Sister     died of kidney failure  . Breast cancer Neg Hx   . Colon cancer Neg Hx    Social History   Social History   . Marital status: Widowed    Spouse name: N/A  . Number of children: 2  . Years of education: N/A   Social History Main Topics  . Smoking status: Never Smoker  . Smokeless tobacco: Never Used  . Alcohol use 0.6 oz/week    1 Glasses of wine per week     Comment: occasional  . Drug use: No  . Sexual activity: No   Other Topics Concern  . None   Social History Narrative  . None    Outpatient Encounter Prescriptions as of 02/14/2016  Medication Sig  . aspirin EC 81 MG tablet Take 81 mg by mouth daily. PM  . ezetimibe-simvastatin (VYTORIN) 10-20 MG tablet TAKE ONE TABLET AT BEDTIME  . Multiple Vitamin (MULTIVITAMIN) capsule Take 1 capsule by mouth daily. AM  . omeprazole (PRILOSEC) 20 MG capsule Take 20 mg by mouth daily. AM  . telmisartan (MICARDIS) 40 MG tablet TAKE ONE TABLET BY MOUTH EVERY MORNING   No facility-administered encounter medications on file as of 02/14/2016.     Review of Systems  Constitutional: Negative for appetite change and unexpected weight change.  HENT: Negative for  congestion and sinus pain.   Respiratory: Negative for cough, chest tightness and shortness of breath.   Cardiovascular: Negative for chest pain, palpitations and leg swelling.  Gastrointestinal: Negative for abdominal pain, diarrhea, nausea and vomiting.  Genitourinary: Negative for difficulty urinating and dysuria.  Musculoskeletal: Negative for back pain and joint swelling.  Skin: Negative for color change and rash.  Neurological: Negative for dizziness, light-headedness and headaches.  Psychiatric/Behavioral: Negative for agitation and dysphoric mood.       Objective:     Physical Exam  Constitutional: She appears well-developed and well-nourished. No distress.  HENT:  Nose: Nose normal.  Mouth/Throat: Oropharynx is clear and moist.  Neck: Neck supple. No thyromegaly present.  Cardiovascular: Normal rate and regular rhythm.   Pulmonary/Chest: Breath sounds normal. No  respiratory distress. She has no wheezes.  Abdominal: Soft. Bowel sounds are normal. There is no tenderness.  Musculoskeletal: She exhibits no edema or tenderness.  Lymphadenopathy:    She has no cervical adenopathy.  Skin: No rash noted. No erythema.  Psychiatric: She has a normal mood and affect. Her behavior is normal.    BP 110/64   Pulse 74   Temp 97.3 F (36.3 C) (Oral)   Ht _0  (1.6 m)   Wt 197 lb 9.6 oz (89.6 kg)   LMP 01/29/1993   SpO2 96%   BMI 35.00 kg/m  Wt Readings from Last 3 Encounters:  02/14/16 197 lb 9.6 oz (89.6 kg)  08/11/15 198 lb 4 oz (89.9 kg)  04/05/15 198 lb 4 oz (89.9 kg)     Lab Results  Component Value Date   WBC 5.4 09/16/2015   HGB 14.7 09/16/2015   HCT 43.4 09/16/2015   PLT 260.0 09/16/2015   GLUCOSE 123 (H) 09/16/2015   CHOL 152 09/16/2015   TRIG 128.0 09/16/2015   HDL 41.40 09/16/2015   LDLDIRECT 182.4 02/05/2012   LDLCALC 85 09/16/2015   ALT 22 09/16/2015   AST 20 09/16/2015   NA 141 09/16/2015   K 3.9 09/16/2015   CL 107 09/16/2015   CREATININE 0.91 09/16/2015   BUN 18 09/16/2015   CO2 25 09/16/2015   TSH 4.01 09/16/2015   INR 0.99 04/22/2012   HGBA1C 5.9 09/16/2015    Dexascan  Result Date: 03/01/2015 EXAM: DUAL X-RAY ABSORPTIOMETRY (DXA) FOR BONE MINERAL DENSITY IMPRESSION: Dear Dr. Nicki Reaper, Your patient Kaitlin Dean completed a BMD test on 03/01/2015 using the [employer] (analysis version: 14.10) manufactured by [employer]. The following summarizes the results of our evaluation. PATIENT BIOGRAPHICAL: Name: Kaitlin, Dean Patient ID: [id number] Birth Date: 18-Jun-1945 Height: 63.0 in. Gender: Female Exam Date: 03/01/2015 Weight: 197.5 lbs. Indications: Caucasian, Height Loss, Osteoarthritis, Postmenopausal, History of Fracture (Adult) Fractures: Pelvis Treatments: Multi-Vitamin with calcium ASSESSMENT: The BMD measured at Femur Neck Left is 0.854 g/cm2 with a T-score of -1.3. This patient is considered osteopenic  according to [facility]) criteria. L-3 was excluded due to degenerative changes. Site Region Measured Measured WHO Young Adult BMD Date       Age      Classification T-score AP Spine L1-L4 (L3) 03/01/2015 71.8 Osteopenia -1.3 1.028 g/cm2 DualFemur Neck Left 03/01/2015 71.8 Osteopenia -1.3 0.854 g/cm2 World Health [facility]) criteria for post-menopausal, Caucasian Women: Normal:       T-score at or above -1 SD Osteopenia:   T-score between -1 and -2.5 SD Osteoporosis: T-score at or below -2.5 SD RECOMMENDATIONS: [place] recommends that FDA-approved medical therapies be  considered in postmenopausal women and men age 59 or older with a: 1. Hip or vertebral (clinical or morphometric) fracture. 2. T-score of < -2.5 at the spine or hip. 3. Ten-year fracture probability by FRAX of 3% or greater for hip fracture or 20% or greater for major osteoporotic fracture. All treatment decisions require clinical judgment and consideration of individual patient factors, including patient preferences, co-morbidities, previous drug use, risk factors not captured in the FRAX model (e.g. falls, vitamin D deficiency, increased bone turnover, interval significant decline in bone density) and possible under - or over-estimation of fracture risk by FRAX. All patients should ensure an adequate intake of dietary calcium (1200 mg/d) and vitamin D (800 IU daily) unless contraindicated. FOLLOW-UP: People with diagnosed cases of osteoporosis or at high risk for fracture should have regular bone mineral density tests. For patients eligible for Medicare, routine testing is allowed once every 2 years. The testing frequency can be increased to one year for patients who have rapidly progressing disease, those who are receiving or discontinuing medical therapy to restore bone mass, or have additional risk factors. I have reviewed this report, and agree with the above findings. [facility]iology Dear  Dr. Nicki Reaper, Your patient Haislee Corso Mcwhirt completed a FRAX assessment on 03/01/2015 using the [place] (analysis version: 14.10) manufactured by [employer]. The following summarizes the results of our evaluation. PATIENT BIOGRAPHICAL: Name: Kyeisha, Janowicz Patient ID: [id number] Birth Date: 1945/10/30 Height:    63.0 in. Gender:     Female    [age]       Weight:    197.5 lbs. Ethnicity:  White                            Exam Date: 03/01/2015 FRAX* RESULTS:  (version: 3.5) 10-year Probability of Fracture1 Major Osteoporotic Fracture2 Hip Fracture 14.6% 1.9% Population: [place] (Caucasian) Risk Factors: History of Fracture (Adult) Based on Femur (Left) Neck BMD 1 -The 10-year probability of fracture may be lower than reported if the patient has received treatment. 2 -Major Osteoporotic Fracture: Clinical Spine, Forearm, Hip or Shoulder *FRAX is a Materials engineer of the [employer] for Metabolic Bone Disease, a [place] (WHO) [facility]. ASSESSMENT: The probability of a major osteoporotic fracture is 14.6% within the next ten years. The probability of a hip fracture is 1.9% within the next ten years. . Electronically Signed   By: Marin Olp M.D.   On: 03/01/2015 11:06   Mm Screening Breast Tomo Bilateral  Result Date: 03/01/2015 CLINICAL DATA:  Screening. EXAM: DIGITAL SCREENING BILATERAL MAMMOGRAM WITH 3D TOMO WITH CAD COMPARISON:  Previous exam(s). ACR Breast Density Category a: The breast tissue is almost entirely fatty. FINDINGS: There are no findings suspicious for malignancy. Images were processed with CAD. IMPRESSION: No mammographic evidence of malignancy. A result letter of this screening mammogram will be mailed directly to the patient. RECOMMENDATION: Screening mammogram in one year. (Code:SM-B-01Y) BI-RADS CATEGORY  1: Negative. Electronically Signed   By: Altamese Cabal M.D.   On: 03/01/2015 13:07       Assessment &  Plan:   Problem List Items Addressed This Visit    Barrett's esophagus    Scheduled for f/u with GI.        History of HPV infection    Evaluated 03/29/14 - colposcopy.  See overview for results.  Has f/u  planned next week with gyn.       Hypercholesterolemia    On vytorin.  Low cholesterol diet and exercise.  Follow lipid panel and liver function tests.        Relevant Orders   Lipid panel   Hepatic function panel   Hyperglycemia    Low carb diet and exercise.  Follow met b and a1c        Relevant Orders   Hemoglobin A1c   Hypertension    Blood pressure under good control.  Continue same medication regimen.  Follow pressures.  Follow metabolic panel.        Relevant Orders   Basic metabolic panel   Skin cancer    Saw dermatology recently.  States checked out fine.  Sees yearly.            Einar Pheasant, MD

## 2016-02-14 NOTE — Assessment & Plan Note (Signed)
Saw dermatology recently.  States checked out fine.  Sees yearly.

## 2016-02-15 ENCOUNTER — Encounter: Payer: Self-pay | Admitting: Internal Medicine

## 2016-02-16 ENCOUNTER — Ambulatory Visit: Payer: Medicare Other

## 2016-02-20 ENCOUNTER — Ambulatory Visit (INDEPENDENT_AMBULATORY_CARE_PROVIDER_SITE_OTHER): Payer: Medicare Other

## 2016-02-20 VITALS — BP 112/66 | HR 78 | Temp 97.7°F | Resp 14 | Ht 63.0 in | Wt 198.0 lb

## 2016-02-20 DIAGNOSIS — Z0000 Encounter for general adult medical examination without abnormal findings: Secondary | ICD-10-CM

## 2016-02-20 NOTE — Patient Instructions (Addendum)
  Kaitlin Dean , Thank you for taking time to come for your Medicare Wellness Visit. I appreciate your ongoing commitment to your health goals. Please review the following plan we discussed and let me know if I can assist you in the future.   Follow up with Dr. Nicki Reaper as needed.  These are the goals we discussed: Goals    . Healthy Lifestyle          Stay hydrated and drink more water daily. Continue exercise regimen. Make healthy food choices. Low carb foods.  Continue weight watchers.       This is a list of the screening recommended for you and due dates:  Health Maintenance  Topic Date Due  . Mammogram  02/29/2016  . Tetanus Vaccine  01/30/2020  . Colon Cancer Screening  11/24/2023  . Flu Shot  Completed  . DEXA scan (bone density measurement)  Completed  . Shingles Vaccine  Addressed  .  Hepatitis C: One time screening is recommended by Center for Disease Control  (CDC) for  adults born from [birth year] through 1965.   Completed

## 2016-02-20 NOTE — Progress Notes (Signed)
Subjective:   Kaitlin Dean is a 72 y.o. female who presents for Medicare Annual (Subsequent) preventive examination.  Review of Systems:  No ROS.  Medicare Wellness Visit.  Cardiac Risk Factors include: advanced age (>20men, >87 women);hypertension     Objective:     Vitals: BP 112/66 (BP Location: Left Arm, Patient Position: Sitting, Cuff Size: Normal)   Pulse 78   Temp 97.7 F (36.5 C) (Oral)   Resp 14   Ht 5\' 3"  (1.6 m)   Wt 198 lb (89.8 kg)   LMP 01/29/1993   SpO2 96%   BMI 35.07 kg/m   Body mass index is 35.07 kg/m.   Tobacco History  Smoking Status  . Never Smoker  Smokeless Tobacco  . Never Used     Counseling given: Not Answered   Past Medical History:  Diagnosis Date  . 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 Barrett's esophagus   . Chicken pox   . Complication of anesthesia    woke up 15 years ago and felt like could not breathe - no complications   . Diverticulosis of colon without hemorrhage 02/28/2014  . GERD (gastroesophageal reflux disease)   . History of hiatal hernia   . Hypercholesterolemia   . Hypertension   . Osteoporosis   . Pneumonia    in past  . 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 Skin cancer    Past Surgical History:  Procedure Laterality Date  . BREAST BIOPSY Right 2000  . BREAST SURGERY  2000   Biopsy  . COLONOSCOPY    . ESOPHAGOGASTRODUODENOSCOPY    . JOINT REPLACEMENT Left    knee  . PTOSIS REPAIR Bilateral 06/01/2014   Procedure: PTOSIS REPAIR;  Surgeon: Karle Starch, MD;  Location: [facility];  Service: Ophthalmology;  Laterality: Bilateral;  . SKIN CANCER EXCISION    . TOTAL KNEE ARTHROPLASTY Left 04/30/2012   Procedure: TOTAL KNEE ARTHROPLASTY;  Surgeon: Gearlean Alf, MD;  Location: [facility];  Service: Orthopedics;  Laterality: Left;  . TUBAL LIGATION     Family History  Problem Relation Age of Onset  . Hypertension Mother   . Congestive Heart Failure Mother   . Stroke Father   .  Stroke Brother   . Heart failure Brother   . Sarcoidosis Sister     died of kidney failure  . Breast cancer Neg Hx   . Colon cancer Neg Hx    History  Sexual Activity  . Sexual activity: No    Outpatient Encounter Prescriptions as of 02/20/2016  Medication Sig  . aspirin EC 81 MG tablet Take 81 mg by mouth daily. PM  . ezetimibe-simvastatin (VYTORIN) 10-20 MG tablet TAKE ONE TABLET AT BEDTIME  . Multiple Vitamin (MULTIVITAMIN) capsule Take 1 capsule by mouth daily. AM  . omeprazole (PRILOSEC) 20 MG capsule Take 20 mg by mouth daily. AM  . telmisartan (MICARDIS) 40 MG tablet TAKE ONE TABLET BY MOUTH EVERY MORNING   No facility-administered encounter medications on file as of 02/20/2016.     Activities of Daily Living In your present state of health, do you have any difficulty performing the following activities: 02/20/2016  Hearing? N  Vision? N  Difficulty concentrating or making decisions? N  Walking or climbing stairs? N  Dressing or bathing? N  Preparing Food and eating ? N  Using the Toilet? N  In the past six months, have you accidently leaked urine? N  Do you  have problems with loss of bowel control? N  Managing your Medications? N  Managing your Finances? N  Housekeeping or managing your Housekeeping? N  Some recent data might be hidden    Patient Care Team: Einar Pheasant, MD as PCP - General (Internal Medicine)    Assessment:    This is a routine wellness examination for Kaitlin Dean. The goal of the wellness visit is to assist the patient how to close the gaps in care and create a preventative care plan for the patient.   Osteoporosis reviewed.  Medications reviewed; taking without issues or barriers.  Safety issues reviewed; lives alone.  Smoke detectors in the home. No firearms in the home. Wears seatbelts when driving or riding with others. No violence in the home.  No identified risk were noted; The patient was oriented x 3; appropriate in dress and  manner and no objective failures at ADL's or IADL's.   BMI; discussed the importance of a healthy diet, water intake and exercise. Educational material provided.  HTN; followed by PCP.  Health maintenance gaps; closed.  Patient Concerns: None at this time. Follow up with PCP as needed.  Exercise Activities and Dietary recommendations Current Exercise Habits: Home exercise routine, Type of exercise: walking, Time (Minutes): 40, Frequency (Times/Week): 7, Weekly Exercise (Minutes/Week): 280, Intensity: Mild  Goals    . Healthy Lifestyle          Stay hydrated and drink more water daily. Continue exercise regimen. Make healthy food choices. Low carb foods.  Continue weight watchers.      Fall Risk Fall Risk  02/20/2016 08/11/2015 02/14/2015 09/01/2014 08/27/2013  Falls in the past year? No No No No No   Depression Screen PHQ 2/9 Scores 02/20/2016 08/11/2015 02/14/2015 09/01/2014  PHQ - 2 Score 0 0 0 0     Cognitive Function MMSE - Mini Mental State Exam 02/14/2015  Orientation to time 5  Orientation to Place 5  Registration 3  Attention/ Calculation 5  Recall 3  Language- name 2 objects 2  Language- repeat 1  Language- follow 3 step command 3  Language- read & follow direction 1  Write a sentence 1  Copy design 1  Total score 30     6CIT Screen 02/20/2016  What Year? 0 points  What month? 0 points  What time? 0 points  Count back from 20 0 points  Months in reverse 0 points  Repeat phrase 0 points  Total Score 0    Immunization History  Administered Date(s) Administered  . Influenza Split 10/30/2011, 11/14/2012  . Influenza, High Dose Seasonal PF 11/01/2015  . Influenza,inj,Quad PF,36+ Mos 11/12/2013, 01/18/2015  . Pneumococcal Conjugate-13 08/27/2013   Screening Tests Health Maintenance  Topic Date Due  . MAMMOGRAM  02/29/2016  . TETANUS/TDAP  01/30/2020  . COLONOSCOPY  11/24/2023  . INFLUENZA VACCINE  Completed  . DEXA SCAN  Completed  . ZOSTAVAX   Addressed  . Hepatitis C Screening  Completed      Plan:    End of life planning; Advance aging; Advanced directives discussed. Copy of current HCPOA/Living Will requested.  Medicare Attestation I have personally reviewed: The patient's medical and social history Their use of alcohol, tobacco or illicit drugs Their current medications and supplements The patient's functional ability including ADLs,fall risks, home safety risks, cognitive, and hearing and visual impairment Diet and physical activities Evidence for depression   The patient's weight, height, BMI, and visual acuity have been recorded in the chart.  I have  made referrals and provided education to the patient based on review of the above and I have provided the patient with a written personalized care plan for preventive services.    During the course of the visit the patient was educated and counseled about the following appropriate screening and preventive services:   Vaccines to include Pneumoccal, Influenza, Hepatitis B, Td, Zostavax, HCV  Electrocardiogram  Cardiovascular Disease  Colorectal cancer screening  Bone density screening  Diabetes screening  Glaucoma screening  Mammography/PAP  Nutrition counseling   Patient Instructions (the written plan) was given to the patient.   Varney Biles, LPN  [id number]   Reviewed above information.  Agree with plan.  Dr Nicki Reaper

## 2016-02-24 LAB — HM PAP SMEAR: HM PAP: NORMAL

## 2016-03-12 ENCOUNTER — Ambulatory Visit
Admission: RE | Admit: 2016-03-12 | Discharge: 2016-03-12 | Disposition: A | Discharge status: Home / Self Care | Payer: Medicare Other | Source: Ambulatory Visit | Attending: Internal Medicine | Admitting: Internal Medicine

## 2016-03-12 DIAGNOSIS — Z1231 Encounter for screening mammogram for malignant neoplasm of breast: Secondary | ICD-10-CM | POA: Insufficient documentation

## 2016-05-14 ENCOUNTER — Other Ambulatory Visit: Payer: Self-pay | Admitting: Internal Medicine

## 2016-06-08 ENCOUNTER — Encounter: Payer: Self-pay | Admitting: *Deleted

## 2016-06-11 ENCOUNTER — Ambulatory Visit: Payer: Medicare Other | Admitting: Anesthesiology

## 2016-06-11 ENCOUNTER — Encounter: Payer: Self-pay | Admitting: *Deleted

## 2016-06-11 ENCOUNTER — Encounter
Admission: RE | Disposition: A | Discharge status: Home / Self Care | Payer: Self-pay | Source: Ambulatory Visit | Attending: Unknown Physician Specialty

## 2016-06-11 ENCOUNTER — Ambulatory Visit
Admission: RE | Admit: 2016-06-11 | Discharge: 2016-06-11 | Disposition: A | Discharge status: Home / Self Care | Payer: Medicare Other | Source: Ambulatory Visit | Attending: Unknown Physician Specialty | Admitting: Unknown Physician Specialty

## 2016-06-11 DIAGNOSIS — K2270 Barrett's esophagus without dysplasia: Secondary | ICD-10-CM | POA: Diagnosis present

## 2016-06-11 DIAGNOSIS — M1990 Unspecified osteoarthritis, unspecified site: Secondary | ICD-10-CM | POA: Diagnosis not present

## 2016-06-11 DIAGNOSIS — J449 Chronic obstructive pulmonary disease, unspecified: Secondary | ICD-10-CM | POA: Insufficient documentation

## 2016-06-11 DIAGNOSIS — I10 Essential (primary) hypertension: Secondary | ICD-10-CM | POA: Insufficient documentation

## 2016-06-11 DIAGNOSIS — Z7982 Long term (current) use of aspirin: Secondary | ICD-10-CM | POA: Insufficient documentation

## 2016-06-11 DIAGNOSIS — K449 Diaphragmatic hernia without obstruction or gangrene: Secondary | ICD-10-CM | POA: Insufficient documentation

## 2016-06-11 DIAGNOSIS — Z79899 Other long term (current) drug therapy: Secondary | ICD-10-CM | POA: Insufficient documentation

## 2016-06-11 DIAGNOSIS — Z85828 Personal history of other malignant neoplasm of skin: Secondary | ICD-10-CM | POA: Insufficient documentation

## 2016-06-11 DIAGNOSIS — E7800 Pure hypercholesterolemia, unspecified: Secondary | ICD-10-CM | POA: Insufficient documentation

## 2016-06-11 DIAGNOSIS — K219 Gastro-esophageal reflux disease without esophagitis: Secondary | ICD-10-CM | POA: Diagnosis not present

## 2016-06-11 HISTORY — PX: ESOPHAGOGASTRODUODENOSCOPY (EGD) WITH PROPOFOL: SHX5813

## 2016-06-11 HISTORY — DX: Personal history of (healed) traumatic fracture: Z87.81

## 2016-06-11 SURGERY — ESOPHAGOGASTRODUODENOSCOPY (EGD) WITH PROPOFOL
Anesthesia: General

## 2016-06-11 MED ORDER — SODIUM CHLORIDE 0.9 % IV SOLN: INTRAVENOUS | Status: DC

## 2016-06-11 MED ORDER — SODIUM CHLORIDE 0.9 % IV SOLN
INTRAVENOUS | Status: DC
  Administered 2016-06-11 (×2): via INTRAVENOUS

## 2016-06-11 MED ORDER — PROPOFOL 500 MG/50ML IV EMUL
INTRAVENOUS | Status: DC | PRN
  Administered 2016-06-11: 125 ug/kg/min via INTRAVENOUS

## 2016-06-11 MED ORDER — PROPOFOL 10 MG/ML IV BOLUS
INTRAVENOUS | Status: DC | PRN
  Administered 2016-06-11: 50 mg via INTRAVENOUS

## 2016-06-11 MED ORDER — PIPERACILLIN-TAZOBACTAM 3.375 G IVPB 30 MIN
3.3750 g | Freq: Once | INTRAVENOUS | Status: AC
  Administered 2016-06-11: 3.375 g via INTRAVENOUS
  Filled 2016-06-11: qty 50

## 2016-06-11 NOTE — Anesthesia Postprocedure Evaluation (Signed)
Anesthesia Post Note  Patient: Necola Bluestein Freeberg  Procedure(s) Performed: Procedure(s) (LRB): ESOPHAGOGASTRODUODENOSCOPY (EGD) WITH PROPOFOL (N/A)  Patient location during evaluation: Endoscopy Anesthesia Type: General Level of consciousness: awake and alert Pain management: pain level controlled Vital Signs Assessment: post-procedure vital signs reviewed and stable Respiratory status: spontaneous breathing and respiratory function stable Cardiovascular status: stable Anesthetic complications: no     Last Vitals:  Vitals:   06/11/16 0900 06/11/16 0910  BP: 122/78 122/85  Pulse: 64 60  Resp: 12 20  Temp:      Last Pain:  Vitals:   06/11/16 0840  TempSrc: Tympanic                 KEPHART,WILLIAM K

## 2016-06-11 NOTE — Anesthesia Post-op Follow-up Note (Cosign Needed)
Anesthesia QCDR form completed.        

## 2016-06-11 NOTE — Transfer of Care (Signed)
Immediate Anesthesia Transfer of Care Note  Patient: Kaitlin Dean  Procedure(s) Performed: Procedure(s): ESOPHAGOGASTRODUODENOSCOPY (EGD) WITH PROPOFOL (N/A)  Patient Location: PACU and Endoscopy Unit  Anesthesia Type:General  Level of Consciousness: awake, alert  and oriented  Airway & Oxygen Therapy: Patient Spontanous Breathing and Patient connected to nasal cannula oxygen  Post-op Assessment: Report given to RN and Post -op Vital signs reviewed and stable  Post vital signs: Reviewed and stable  Last Vitals:  Vitals:   06/11/16 0737  BP: 129/82  Pulse: 65  Resp: 18  Temp: (!) 35.7 C    Last Pain:  Vitals:   06/11/16 0737  TempSrc: Tympanic         Complications: No apparent anesthesia complications

## 2016-06-11 NOTE — H&P (Signed)
Primary Care Physician:  Kaitlin Pheasant, MD Primary Gastroenterologist:  Dr. Vira Dean  Pre-Procedure History & Physical: HPI:  Kaitlin Dean is a 71 y.o. female is here for an endoscopy.   Past Medical History:  Diagnosis Date  . 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 Barrett's esophagus   . Chicken pox   . Complication of anesthesia    woke up 15 years ago and felt like could not breathe - no complications   . Diverticulosis of colon without hemorrhage 02/28/2014  . GERD (gastroesophageal reflux disease)   . History of fractured kneecap   . History of fractured pelvis   . History of hiatal hernia   . Hypercholesterolemia   . Hypertension   . Osteoporosis   . Pneumonia    in past  . 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 Skin cancer     Past Surgical History:  Procedure Laterality Date  . BREAST EXCISIONAL BIOPSY Right 2000   benign  . BREAST SURGERY  2000   Biopsy  . COLONOSCOPY    . ESOPHAGOGASTRODUODENOSCOPY    . JOINT REPLACEMENT Left    knee  . PTOSIS REPAIR Bilateral 06/01/2014   Procedure: PTOSIS REPAIR;  Surgeon: Karle Starch, MD;  Location: [place];  Service: Ophthalmology;  Laterality: Bilateral;  . SKIN CANCER EXCISION    . TOTAL KNEE ARTHROPLASTY Left 04/30/2012   Procedure: TOTAL KNEE ARTHROPLASTY;  Surgeon: Gearlean Alf, MD;  Location: [facility];  Service: Orthopedics;  Laterality: Left;  . TUBAL LIGATION      Prior to Admission medications   Medication Sig Start Date End Date Taking? Authorizing Provider  albuterol (PROVENTIL HFA;VENTOLIN HFA) 108 (90 Base) MCG/ACT inhaler Inhale 2 puffs into the lungs every 6 (six) hours as needed for wheezing or shortness of breath.   Yes [provider]  aspirin EC 81 MG tablet Take 81 mg by mouth daily. PM   Yes [provider]  calcium carbonate (TITRALAC) 420 MG CHEW chewable tablet Chew 420 mg by mouth 2 (two) times daily.   Yes [provider]   ezetimibe-simvastatin (VYTORIN) 10-20 MG tablet TAKE ONE TABLET AT BEDTIME 02/13/16  Yes Kaitlin Pheasant, MD  Multiple Vitamin (MULTIVITAMIN) capsule Take 1 capsule by mouth daily. AM   Yes [provider]  naproxen sodium (ANAPROX) 220 MG tablet Take 220 mg by mouth 2 (two) times daily with a meal.   Yes [provider]  omeprazole (PRILOSEC) 20 MG capsule Take 20 mg by mouth daily. AM   Yes [provider]  telmisartan (MICARDIS) 40 MG tablet TAKE ONE TABLET BY MOUTH EVERY MORNING 05/16/16  Yes Kaitlin Pheasant, MD  Vitamin E 400 units TABS Take by mouth daily.   Yes [provider]    Allergies as of 04/04/2016 - Review Complete 02/20/2016  Allergen Reaction Noted  . Flagyl [metronidazole] Nausea And Vomiting 10/01/2013    Family History  Problem Relation Age of Onset  . Hypertension Mother   . Congestive Heart Failure Mother   . Stroke Father   . Stroke Brother   . Heart failure Brother   . Sarcoidosis Sister        died of kidney failure  . Breast cancer Neg Hx   . Colon cancer Neg Hx     Social History   Social History  . Marital status: Widowed    Spouse name: N/A  . Number of children: 2  .  Years of education: N/A   Occupational History  . Not on file.   Social History Main Topics  . Smoking status: Never Smoker  . Smokeless tobacco: Never Used  . Alcohol use 0.0 oz/week     Comment: SOCIALLY  . Drug use: No  . Sexual activity: No   Other Topics Concern  . Not on file   Social History Narrative  . No narrative on file    Review of Systems: See HPI, otherwise negative ROS  Physical Exam: BP 129/82   Pulse 65   Temp (!) 96.2 F (35.7 C) (Tympanic)   Resp 18   Ht 5\' 3"  (1.6 m)   Wt 84.4 kg (186 lb)   LMP 01/29/1993   SpO2 100%   BMI 32.95 kg/m  General:   Alert,  pleasant and cooperative in NAD Head:  Normocephalic and atraumatic. Neck:  Supple; no masses or thyromegaly. Lungs:  Clear throughout to  auscultation.    Heart:  Regular rate and rhythm. Abdomen:  Soft, nontender and nondistended. Normal bowel sounds, without guarding, and without rebound.   Neurologic:  Alert and  oriented x4;  grossly normal neurologically.  Impression/Plan: Kaitlin Dean is here for an endoscopy to be performed for follow up Barretts esophagus.  Risks, benefits, limitations, and alternatives regarding  endoscopy have been reviewed with the patient.  Questions have been answered.  All parties agreeable.   Gaylyn Cheers, MD  06/11/2016, 8:17 AM

## 2016-06-11 NOTE — Op Note (Signed)
[facility] Gastroenterology Patient Name: Kaitlin Dean Procedure Date: 06/11/2016 8:14 AM MRN: [id number] Account #: [id number] Date of Birth: Dec 20, 1945 Admit Type: Outpatient Age: 71 Room: [facility] ENDO ROOM 3 Gender: Female Note Status: Finalized Procedure:            Upper GI endoscopy Indications:          Follow-up of Barrett's esophagus Providers:            Manya Silvas, MD Referring MD:         Einar Pheasant, MD (Referring MD) Medicines:            Propofol per Anesthesia Complications:        No immediate complications. Procedure:            Pre-Anesthesia Assessment:                       - After reviewing the risks and benefits, the patient                        was deemed in satisfactory condition to undergo the                        procedure.                       After obtaining informed consent, the endoscope was                        passed under direct vision. Throughout the procedure,                        the patient's blood pressure, pulse, and oxygen                        saturations were monitored continuously. The Endoscope                        was introduced through the mouth, and advanced to the                        second part of duodenum. The upper GI endoscopy was                        accomplished without difficulty. The patient tolerated                        the procedure. Findings:      There were esophageal mucosal changes secondary to established       short-segment Barrett's disease present in the distal esophagus. The       maximum longitudinal extent of these mucosal changes was 2 cm in length.       Mucosa was biopsied with a cold forceps for histology. One specimen       bottle was sent to pathology.      A medium large hiatal hernia was present.      Multiple small pedunculated and sessile polyps with no bleeding and no       stigmata of recent bleeding were found in the gastric body. These were   harmless "fundic type"      The examined duodenum was normal. Impression:           -  Esophageal mucosal changes secondary to established                        short-segment Barrett's disease. Biopsied.                       - Large hiatal hernia.                       - Multiple gastric polyps.                       - Normal examined duodenum. Recommendation:       - Await pathology results. Manya Silvas, MD 06/11/2016 8:36:30 AM This report has been signed electronically. Number of Addenda: 0 Note Initiated On: 06/11/2016 8:14 AM      [facility]

## 2016-06-11 NOTE — Anesthesia Preprocedure Evaluation (Signed)
Anesthesia Evaluation  Patient identified by MRN, date of birth, ID band Patient awake    Reviewed: Allergy & Precautions, NPO status , Patient's Chart, lab work & pertinent test results  History of Anesthesia Complications Negative for: history of anesthetic complications  Airway Mallampati: II       Dental   Pulmonary COPD (no iunhalers x 8 months),  COPD inhaler,           Cardiovascular hypertension, Pt. on medications      Neuro/Psych    GI/Hepatic Neg liver ROS, hiatal hernia, GERD  Medicated,  Endo/Other  negative endocrine ROS  Renal/GU negative Renal ROS     Musculoskeletal   Abdominal   Peds  Hematology negative hematology ROS (+)   Anesthesia Other Findings   Reproductive/Obstetrics                             Anesthesia Physical Anesthesia Plan  ASA: III  Anesthesia Plan: General   Post-op Pain Management:    Induction: Intravenous  Airway Management Planned: Nasal Cannula  Additional Equipment:   Intra-op Plan:   Post-operative Plan:   Informed Consent: I have reviewed the patients History and Physical, chart, labs and discussed the procedure including the risks, benefits and alternatives for the proposed anesthesia with the patient or authorized representative who has indicated his/her understanding and acceptance.     Plan Discussed with:   Anesthesia Plan Comments:         Anesthesia Quick Evaluation

## 2016-06-12 ENCOUNTER — Encounter: Payer: Self-pay | Admitting: Unknown Physician Specialty

## 2016-06-12 LAB — SURGICAL PATHOLOGY

## 2016-06-13 ENCOUNTER — Other Ambulatory Visit: Payer: Self-pay | Admitting: Internal Medicine

## 2016-08-08 ENCOUNTER — Other Ambulatory Visit: Payer: Self-pay | Admitting: Internal Medicine

## 2016-08-17 ENCOUNTER — Encounter: Payer: Self-pay | Admitting: Internal Medicine

## 2016-08-17 ENCOUNTER — Ambulatory Visit (INDEPENDENT_AMBULATORY_CARE_PROVIDER_SITE_OTHER): Payer: Medicare Other | Admitting: Internal Medicine

## 2016-08-17 VITALS — BP 116/74 | HR 81 | Temp 98.6°F | Resp 12 | Ht 63.0 in | Wt 186.6 lb

## 2016-08-17 DIAGNOSIS — R1084 Generalized abdominal pain: Secondary | ICD-10-CM | POA: Diagnosis not present

## 2016-08-17 DIAGNOSIS — I10 Essential (primary) hypertension: Secondary | ICD-10-CM | POA: Diagnosis not present

## 2016-08-17 DIAGNOSIS — K219 Gastro-esophageal reflux disease without esophagitis: Secondary | ICD-10-CM

## 2016-08-17 DIAGNOSIS — K2270 Barrett's esophagus without dysplasia: Secondary | ICD-10-CM

## 2016-08-17 DIAGNOSIS — Z0000 Encounter for general adult medical examination without abnormal findings: Secondary | ICD-10-CM

## 2016-08-17 DIAGNOSIS — Z0001 Encounter for general adult medical examination with abnormal findings: Secondary | ICD-10-CM | POA: Diagnosis not present

## 2016-08-17 DIAGNOSIS — R739 Hyperglycemia, unspecified: Secondary | ICD-10-CM

## 2016-08-17 DIAGNOSIS — Z6833 Body mass index (BMI) 33.0-33.9, adult: Secondary | ICD-10-CM | POA: Diagnosis not present

## 2016-08-17 DIAGNOSIS — E7800 Pure hypercholesterolemia, unspecified: Secondary | ICD-10-CM

## 2016-08-17 DIAGNOSIS — R109 Unspecified abdominal pain: Secondary | ICD-10-CM | POA: Insufficient documentation

## 2016-08-17 DIAGNOSIS — Z8619 Personal history of other infectious and parasitic diseases: Secondary | ICD-10-CM | POA: Diagnosis not present

## 2016-08-17 LAB — LIPID PANEL
Cholesterol: 143 mg/dL (ref 0–200)
HDL: 43.6 mg/dL (ref >39.00)
LDL Cholesterol: 73 mg/dL (ref 0–99)
NonHDL: 99.19
TRIGLYCERIDES: 130 mg/dL (ref 0.0–149.0)
Total CHOL/HDL Ratio: 3
VLDL: 26 mg/dL (ref 0.0–40.0)

## 2016-08-17 LAB — CBC WITH DIFFERENTIAL/PLATELET
BASOS ABS: 0 10*3/uL (ref 0.0–0.1)
Basophils Relative: 0.3 % (ref 0.0–3.0)
EOS ABS: 0.2 10*3/uL (ref 0.0–0.7)
Eosinophils Relative: 3.6 % (ref 0.0–5.0)
HCT: 45.3 % (ref 36.0–46.0)
Hemoglobin: 15.1 g/dL — ABNORMAL HIGH (ref 12.0–15.0)
LYMPHS ABS: 1.3 10*3/uL (ref 0.7–4.0)
LYMPHS PCT: 26.6 % (ref 12.0–46.0)
MCHC: 33.2 g/dL (ref 30.0–36.0)
MCV: 93.3 fl (ref 78.0–100.0)
Monocytes Absolute: 0.4 10*3/uL (ref 0.1–1.0)
Monocytes Relative: 7.2 % (ref 3.0–12.0)
NEUTROS PCT: 62.3 % (ref 43.0–77.0)
Neutro Abs: 3.1 10*3/uL (ref 1.4–7.7)
Platelets: 264 10*3/uL (ref 150.0–400.0)
RBC: 4.86 Mil/uL (ref 3.87–5.11)
RDW: 12.9 % (ref 11.5–15.5)
WBC: 4.9 10*3/uL (ref 4.0–10.5)

## 2016-08-17 LAB — BASIC METABOLIC PANEL
BUN: 19 mg/dL (ref 6–23)
CO2: 28 mEq/L (ref 19–32)
Calcium: 9.7 mg/dL (ref 8.4–10.5)
Chloride: 105 mEq/L (ref 96–112)
Creatinine, Ser: 0.9 mg/dL (ref 0.40–1.20)
GFR: 65.17 mL/min (ref >60.00)
GLUCOSE: 112 mg/dL — AB (ref 70–99)
POTASSIUM: 3.9 meq/L (ref 3.5–5.1)
Sodium: 141 mEq/L (ref 135–145)

## 2016-08-17 LAB — HEPATIC FUNCTION PANEL
ALK PHOS: 90 U/L (ref 39–117)
ALT: 21 U/L (ref 0–35)
AST: 18 U/L (ref 0–37)
Albumin: 4.4 g/dL (ref 3.5–5.2)
BILIRUBIN DIRECT: 0.2 mg/dL (ref 0.0–0.3)
TOTAL PROTEIN: 7 g/dL (ref 6.0–8.3)
Total Bilirubin: 0.9 mg/dL (ref 0.2–1.2)

## 2016-08-17 LAB — HEMOGLOBIN A1C: Hgb A1c MFr Bld: 5.8 % (ref 4.6–6.5)

## 2016-08-17 LAB — TSH: TSH: 3.83 u[IU]/mL (ref 0.35–4.50)

## 2016-08-17 NOTE — Progress Notes (Signed)
Pre-visit discussion using our clinic review tool. No additional management support is needed unless otherwise documented below in the visit note.  

## 2016-08-17 NOTE — Assessment & Plan Note (Signed)
On vytorin.  Has adjusted her diet.  Lost weight.  Follow lipid panel and liver function tests.

## 2016-08-17 NOTE — Assessment & Plan Note (Signed)
Physical today 08/17/16.  PAP through gyn.  Mammogram 03/12/16 - Birads I.  Colonoscopy 11/23/13 - diverticulosis and internal hemorrhoids.

## 2016-08-17 NOTE — Progress Notes (Signed)
Patient ID: Kaitlin Dean, female   DOB: [birth year], 71 y.o.   MRN: [id number]   Subjective:    Patient ID: Kaitlin Dean, female    DOB: 02/08/1945, 71 y.o.   MRN: [id number]  HPI  Patient here for her physical exam.  She reports she is doing well.  Her daughter and family moved to [place] from [place].  She is enjoying them being closer.  She is staying active.  Is going to the gym.  Joined weight watchers.  Has a Physiological scientist.  Staying active.  No chest pain.  No sob.  No acid reflux reported.  She does report persistent intermittent flares with abdominal discomfort and diarrhea.  Episodes occur every two weeks.  No vomiting.  Has been a persistent problem for her.  Has lost weight, but has also been trying to lose weight.     Past Medical History:  Diagnosis Date  . 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 Barrett's esophagus   . Chicken pox   . Complication of anesthesia    woke up 15 years ago and felt like could not breathe - no complications   . Diverticulosis of colon without hemorrhage 02/28/2014  . GERD (gastroesophageal reflux disease)   . History of fractured kneecap   . History of fractured pelvis   . History of hiatal hernia   . Hypercholesterolemia   . Hypertension   . Osteoporosis   . Pneumonia    in past  . 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 Skin cancer    Past Surgical History:  Procedure Laterality Date  . BREAST EXCISIONAL BIOPSY Right 2000   benign  . BREAST SURGERY  2000   Biopsy  . COLONOSCOPY    . ESOPHAGOGASTRODUODENOSCOPY    . ESOPHAGOGASTRODUODENOSCOPY (EGD) WITH PROPOFOL N/A 06/11/2016   Procedure: ESOPHAGOGASTRODUODENOSCOPY (EGD) WITH PROPOFOL;  Surgeon: Manya Silvas, MD;  Location: [facility];  Service: Endoscopy;  Laterality: N/A;  . JOINT REPLACEMENT Left    knee  . PTOSIS REPAIR Bilateral 06/01/2014   Procedure: PTOSIS REPAIR;  Surgeon: Karle Starch, MD;  Location: [place];  Service: Ophthalmology;   Laterality: Bilateral;  . SKIN CANCER EXCISION    . TOTAL KNEE ARTHROPLASTY Left 04/30/2012   Procedure: TOTAL KNEE ARTHROPLASTY;  Surgeon: Gearlean Alf, MD;  Location: [facility];  Service: Orthopedics;  Laterality: Left;  . TUBAL LIGATION     Family History  Problem Relation Age of Onset  . Hypertension Mother   . Congestive Heart Failure Mother   . Stroke Father   . Stroke Brother   . Heart failure Brother   . Sarcoidosis Sister        died of kidney failure  . Breast cancer Neg Hx   . Colon cancer Neg Hx    Social History   Social History  . Marital status: Widowed    Spouse name: N/A  . Number of children: 2  . Years of education: N/A   Social History Main Topics  . Smoking status: Never Smoker  . Smokeless tobacco: Never Used  . Alcohol use 0.0 oz/week     Comment: SOCIALLY  . Drug use: No  . Sexual activity: No   Other Topics Concern  . None   Social History Narrative  . None    Outpatient Encounter Prescriptions as of 08/17/2016  Medication Sig  . albuterol (PROVENTIL HFA;VENTOLIN HFA) 108 (90 Base) MCG/ACT inhaler Inhale 2 puffs into  the lungs every 6 (six) hours as needed for wheezing or shortness of breath.  Marland Kitchen aspirin EC 81 MG tablet Take 81 mg by mouth daily. PM  . calcium carbonate (TITRALAC) 420 MG CHEW chewable tablet Chew 420 mg by mouth 2 (two) times daily.  Marland Kitchen ezetimibe-simvastatin (VYTORIN) 10-20 MG tablet TAKE ONE TABLET AT BEDTIME  . Multiple Vitamin (MULTIVITAMIN) capsule Take 1 capsule by mouth daily. AM  . naproxen sodium (ANAPROX) 220 MG tablet Take 220 mg by mouth as needed.   Marland Kitchen omeprazole (PRILOSEC) 20 MG capsule Take 20 mg by mouth daily. AM  . telmisartan (MICARDIS) 40 MG tablet TAKE ONE TABLET EVERY MORNING  . Vitamin E 400 units TABS Take by mouth daily.   No facility-administered encounter medications on file as of 08/17/2016.     Review of Systems  Constitutional: Negative for appetite change and fever.  HENT: Negative for  congestion and sinus pressure.   Eyes: Negative for pain and visual disturbance.  Respiratory: Negative for cough, chest tightness and shortness of breath.   Cardiovascular: Negative for chest pain, palpitations and leg swelling.  Gastrointestinal: Positive for abdominal pain and diarrhea. Negative for nausea and vomiting.  Genitourinary: Negative for difficulty urinating and dysuria.  Musculoskeletal: Negative for back pain and joint swelling.  Skin: Negative for color change and rash.  Neurological: Negative for dizziness, light-headedness and headaches.  Hematological: Negative for adenopathy. Does not bruise/bleed easily.  Psychiatric/Behavioral: Negative for agitation and dysphoric mood.       Objective:    Physical Exam  Constitutional: She is oriented to person, place, and time. She appears well-developed and well-nourished. No distress.  HENT:  Nose: Nose normal.  Mouth/Throat: Oropharynx is clear and moist.  Eyes: Right eye exhibits no discharge. Left eye exhibits no discharge. No scleral icterus.  Neck: Neck supple. No thyromegaly present.  Cardiovascular: Normal rate and regular rhythm.   Pulmonary/Chest: Breath sounds normal. No accessory muscle usage. No tachypnea. No respiratory distress. She has no decreased breath sounds. She has no wheezes. She has no rhonchi. Right breast exhibits no inverted nipple, no mass, no nipple discharge and no tenderness (no axillary adenopathy). Left breast exhibits no inverted nipple, no mass, no nipple discharge and no tenderness (no axilarry adenopathy).  Abdominal: Soft. Bowel sounds are normal. There is no tenderness.  Musculoskeletal: She exhibits no edema or tenderness.  Lymphadenopathy:    She has no cervical adenopathy.  Neurological: She is alert and oriented to person, place, and time.  Skin: Skin is warm. No rash noted. No erythema.  Psychiatric: She has a normal mood and affect. Her behavior is normal.    BP 116/74 (BP  Location: Left Arm, Patient Position: Sitting, Cuff Size: Normal)   Pulse 81   Temp 98.6 F (37 C) (Oral)   Resp 12   Ht _0  (1.6 m)   Wt 186 lb 9.6 oz (84.6 kg)   LMP 01/29/1993   SpO2 97%   BMI 33.05 kg/m  Wt Readings from Last 3 Encounters:  08/17/16 186 lb 9.6 oz (84.6 kg)  06/11/16 186 lb (84.4 kg)  02/20/16 198 lb (89.8 kg)     Lab Results  Component Value Date   WBC 4.9 08/17/2016   HGB 15.1 (H) 08/17/2016   HCT 45.3 08/17/2016   PLT 264.0 08/17/2016   GLUCOSE 112 (H) 08/17/2016   CHOL 143 08/17/2016   TRIG 130.0 08/17/2016   HDL 43.60 08/17/2016   LDLDIRECT 182.4 02/05/2012   LDLCALC  73 08/17/2016   ALT 21 08/17/2016   AST 18 08/17/2016   NA 141 08/17/2016   K 3.9 08/17/2016   CL 105 08/17/2016   CREATININE 0.90 08/17/2016   BUN 19 08/17/2016   CO2 28 08/17/2016   TSH 3.83 08/17/2016   INR 0.99 04/22/2012   HGBA1C 5.8 08/17/2016       Assessment & Plan:   Problem List Items Addressed This Visit    Abdominal pain    Persistent intermittent abdominal pain and bowel change.  Has lost weight, but has also been trying.  Given persistent symptoms, will obtain CT scan abdomen and pelvis.  Also refer back to GI.  She had questions about her EGD.  States was told had gastric polyps and wanted to discuss f/u tests.        Relevant Orders   CT Abdomen Pelvis W Contrast   Ambulatory referral to Gastroenterology   Barrett's esophagus    Followed by GI.  Last EGD 05/2016.  States no change.  On omeprazole.       Relevant Orders   Ambulatory referral to Gastroenterology   BMI 33.0-33.9,adult    She has adjusted her diet.  Has lost weight.  Follow.  Going to the gym.       GERD (gastroesophageal reflux disease)    Seeing GI.  Has Barretts.  Last EGD 05/2016 per GI note.  No change.       Relevant Orders   Ambulatory referral to Gastroenterology   Health care maintenance    Physical today 08/17/16.  PAP through gyn.  Mammogram 03/12/16 - Birads I.   Colonoscopy 11/23/13 - diverticulosis and internal hemorrhoids.       History of HPV infection    Seeing gyn.  Last evaluated 01/2016.  States pap normal.        Hypercholesterolemia    On vytorin.  Has adjusted her diet.  Lost weight.  Follow lipid panel and liver function tests.        Relevant Orders   Lipid panel (Completed)   Hepatic function panel (Completed)   Hyperglycemia    She has adjusted her diet.  Lost weight.  Follow met b and a1c.       Relevant Orders   Hemoglobin A1c (Completed)   Hypertension - Primary    Blood pressure under good control.  Continue same medication regimen.  Follow pressures.  Follow metabolic panel.        Relevant Orders   CBC with Differential/Platelet (Completed)   TSH (Completed)   Basic metabolic panel (Completed)       Einar Pheasant, MD

## 2016-08-17 NOTE — Assessment & Plan Note (Signed)
Blood pressure under good control.  Continue same medication regimen.  Follow pressures.  Follow metabolic panel.   

## 2016-08-17 NOTE — Assessment & Plan Note (Signed)
She has adjusted her diet.  Lost weight.  Follow met b and a1c.   

## 2016-08-19 ENCOUNTER — Encounter: Payer: Self-pay | Admitting: Internal Medicine

## 2016-08-19 NOTE — Assessment & Plan Note (Signed)
Followed by GI.  Last EGD 05/2016.  States no change.  On omeprazole.

## 2016-08-19 NOTE — Assessment & Plan Note (Signed)
Seeing GI.  Has Barretts.  Last EGD 05/2016 per GI note.  No change.

## 2016-08-19 NOTE — Assessment & Plan Note (Signed)
She has adjusted her diet.  Has lost weight.  Follow.  Going to the gym.

## 2016-08-19 NOTE — Assessment & Plan Note (Signed)
Seeing gyn.  Last evaluated 01/2016.  States pap normal.

## 2016-08-19 NOTE — Assessment & Plan Note (Addendum)
Persistent intermittent abdominal pain and bowel change.  Has lost weight, but has also been trying.  Given persistent symptoms, will obtain CT scan abdomen and pelvis.  Also refer back to GI.  She had questions about her EGD.  States was told had gastric polyps and wanted to discuss f/u tests.

## 2016-08-27 ENCOUNTER — Ambulatory Visit
Admission: RE | Admit: 2016-08-27 | Discharge: 2016-08-27 | Disposition: A | Discharge status: Home / Self Care | Payer: Medicare Other | Source: Ambulatory Visit | Attending: Internal Medicine | Admitting: Internal Medicine

## 2016-08-27 DIAGNOSIS — I700 Atherosclerosis of aorta: Secondary | ICD-10-CM | POA: Diagnosis not present

## 2016-08-27 DIAGNOSIS — K439 Ventral hernia without obstruction or gangrene: Secondary | ICD-10-CM | POA: Diagnosis not present

## 2016-08-27 DIAGNOSIS — K5730 Diverticulosis of large intestine without perforation or abscess without bleeding: Secondary | ICD-10-CM | POA: Diagnosis not present

## 2016-08-27 DIAGNOSIS — I708 Atherosclerosis of other arteries: Secondary | ICD-10-CM | POA: Diagnosis not present

## 2016-08-27 DIAGNOSIS — K449 Diaphragmatic hernia without obstruction or gangrene: Secondary | ICD-10-CM | POA: Diagnosis not present

## 2016-08-27 DIAGNOSIS — R1084 Generalized abdominal pain: Secondary | ICD-10-CM | POA: Insufficient documentation

## 2016-08-27 DIAGNOSIS — I2510 Atherosclerotic heart disease of native coronary artery without angina pectoris: Secondary | ICD-10-CM | POA: Diagnosis not present

## 2016-08-27 DIAGNOSIS — M4856XA Collapsed vertebra, not elsewhere classified, lumbar region, initial encounter for fracture: Secondary | ICD-10-CM | POA: Diagnosis not present

## 2016-08-27 MED ORDER — IOPAMIDOL (ISOVUE-300) INJECTION 61%
100.0000 mL | Freq: Once | INTRAVENOUS | Status: AC | PRN
  Administered 2016-08-27: 100 mL via INTRAVENOUS

## 2016-08-28 ENCOUNTER — Encounter: Payer: Self-pay | Admitting: Internal Medicine

## 2016-08-28 DIAGNOSIS — I700 Atherosclerosis of aorta: Secondary | ICD-10-CM | POA: Insufficient documentation

## 2016-10-09 ENCOUNTER — Other Ambulatory Visit: Payer: Self-pay | Admitting: Internal Medicine

## 2016-11-09 ENCOUNTER — Ambulatory Visit (INDEPENDENT_AMBULATORY_CARE_PROVIDER_SITE_OTHER): Payer: Medicare Other

## 2016-11-09 DIAGNOSIS — Z23 Encounter for immunization: Secondary | ICD-10-CM | POA: Diagnosis not present

## 2016-12-04 NOTE — Discharge Instructions (Signed)

## 2016-12-05 ENCOUNTER — Ambulatory Visit: Payer: Medicare Other | Admitting: Anesthesiology

## 2016-12-05 ENCOUNTER — Ambulatory Visit
Admission: RE | Admit: 2016-12-05 | Discharge: 2016-12-05 | Disposition: A | Discharge status: Home / Self Care | Payer: Medicare Other | Source: Ambulatory Visit | Attending: Ophthalmology | Admitting: Ophthalmology

## 2016-12-05 ENCOUNTER — Encounter
Admission: RE | Disposition: A | Discharge status: Home / Self Care | Payer: Self-pay | Source: Ambulatory Visit | Attending: Ophthalmology

## 2016-12-05 DIAGNOSIS — Z79899 Other long term (current) drug therapy: Secondary | ICD-10-CM | POA: Insufficient documentation

## 2016-12-05 DIAGNOSIS — M810 Age-related osteoporosis without current pathological fracture: Secondary | ICD-10-CM | POA: Diagnosis not present

## 2016-12-05 DIAGNOSIS — Z96652 Presence of left artificial knee joint: Secondary | ICD-10-CM | POA: Insufficient documentation

## 2016-12-05 DIAGNOSIS — K219 Gastro-esophageal reflux disease without esophagitis: Secondary | ICD-10-CM | POA: Insufficient documentation

## 2016-12-05 DIAGNOSIS — Z85828 Personal history of other malignant neoplasm of skin: Secondary | ICD-10-CM | POA: Diagnosis not present

## 2016-12-05 DIAGNOSIS — E7800 Pure hypercholesterolemia, unspecified: Secondary | ICD-10-CM | POA: Insufficient documentation

## 2016-12-05 DIAGNOSIS — Z9889 Other specified postprocedural states: Secondary | ICD-10-CM | POA: Diagnosis not present

## 2016-12-05 DIAGNOSIS — Z883 Allergy status to other anti-infective agents status: Secondary | ICD-10-CM | POA: Diagnosis not present

## 2016-12-05 DIAGNOSIS — I10 Essential (primary) hypertension: Secondary | ICD-10-CM | POA: Insufficient documentation

## 2016-12-05 DIAGNOSIS — Z7982 Long term (current) use of aspirin: Secondary | ICD-10-CM | POA: Diagnosis not present

## 2016-12-05 DIAGNOSIS — H2511 Age-related nuclear cataract, right eye: Secondary | ICD-10-CM | POA: Diagnosis present

## 2016-12-05 DIAGNOSIS — K449 Diaphragmatic hernia without obstruction or gangrene: Secondary | ICD-10-CM | POA: Diagnosis not present

## 2016-12-05 DIAGNOSIS — J45909 Unspecified asthma, uncomplicated: Secondary | ICD-10-CM | POA: Diagnosis not present

## 2016-12-05 DIAGNOSIS — Z791 Long term (current) use of non-steroidal anti-inflammatories (NSAID): Secondary | ICD-10-CM | POA: Diagnosis not present

## 2016-12-05 DIAGNOSIS — M1990 Unspecified osteoarthritis, unspecified site: Secondary | ICD-10-CM | POA: Diagnosis not present

## 2016-12-05 HISTORY — DX: Unspecified osteoarthritis, unspecified site: M19.90

## 2016-12-05 HISTORY — PX: CATARACT EXTRACTION W/PHACO: SHX586

## 2016-12-05 SURGERY — PHACOEMULSIFICATION, CATARACT, WITH IOL INSERTION
Anesthesia: Monitor Anesthesia Care | Laterality: Right | Wound class: Clean

## 2016-12-05 MED ORDER — MOXIFLOXACIN HCL 0.5 % OP SOLN
1.0000 [drp] | OPHTHALMIC | Status: DC | PRN
  Administered 2016-12-05 (×3): 1 [drp] via OPHTHALMIC

## 2016-12-05 MED ORDER — LIDOCAINE HCL (PF) 2 % IJ SOLN
INTRAOCULAR | Status: DC | PRN
  Administered 2016-12-05: 1 mL

## 2016-12-05 MED ORDER — FENTANYL CITRATE (PF) 100 MCG/2ML IJ SOLN
INTRAMUSCULAR | Status: DC | PRN
  Administered 2016-12-05: 50 ug via INTRAVENOUS

## 2016-12-05 MED ORDER — NA HYALUR & NA CHOND-NA HYALUR 0.4-0.35 ML IO KIT
PACK | INTRAOCULAR | Status: DC | PRN
Start: 2016-12-05 — End: 2016-12-05
  Administered 2016-12-05: 1 mL via INTRAOCULAR

## 2016-12-05 MED ORDER — EPINEPHRINE PF 1 MG/ML IJ SOLN
INTRAMUSCULAR | Status: DC | PRN
  Administered 2016-12-05: 75 mL via OPHTHALMIC

## 2016-12-05 MED ORDER — CEFUROXIME OPHTHALMIC INJECTION 1 MG/0.1 ML
INJECTION | OPHTHALMIC | Status: DC | PRN
Start: 2016-12-05 — End: 2016-12-05
  Administered 2016-12-05: 0.1 mL via INTRACAMERAL

## 2016-12-05 MED ORDER — LACTATED RINGERS IV SOLN: 1000.0000 mL | INTRAVENOUS | Status: DC

## 2016-12-05 MED ORDER — BRIMONIDINE TARTRATE-TIMOLOL 0.2-0.5 % OP SOLN
OPHTHALMIC | Status: DC | PRN
  Administered 2016-12-05: 1 [drp] via OPHTHALMIC

## 2016-12-05 MED ORDER — ARMC OPHTHALMIC DILATING DROPS
1.0000 "application " | OPHTHALMIC | Status: DC | PRN
  Administered 2016-12-05 (×3): 1 via OPHTHALMIC

## 2016-12-05 MED ORDER — MIDAZOLAM HCL 2 MG/2ML IJ SOLN
INTRAMUSCULAR | Status: DC | PRN
  Administered 2016-12-05: 2 mg via INTRAVENOUS

## 2016-12-05 SURGICAL SUPPLY — 25 items
CANNULA ANT/CHMB 27GA (MISCELLANEOUS) ×3 IMPLANT
CARTRIDGE ABBOTT (MISCELLANEOUS) IMPLANT
GLOVE SURG LX 7.5 STRW (GLOVE) ×2
GLOVE SURG LX STRL 7.5 STRW (GLOVE) ×1 IMPLANT
GLOVE SURG TRIUMPH 8.0 PF LTX (GLOVE) ×3 IMPLANT
GOWN STRL REUS W/ TWL LRG LVL3 (GOWN DISPOSABLE) ×2 IMPLANT
GOWN STRL REUS W/TWL LRG LVL3 (GOWN DISPOSABLE) ×4
MARKER SKIN DUAL TIP RULER LAB (MISCELLANEOUS) ×3 IMPLANT
NDL RETROBULBAR .5 NSTRL (NEEDLE) IMPLANT
NEEDLE FILTER BLUNT 18X 1/2SAF (NEEDLE) ×2
NEEDLE FILTER BLUNT 18X1 1/2 (NEEDLE) ×1 IMPLANT
PACK CATARACT BRASINGTON (MISCELLANEOUS) ×3 IMPLANT
PACK EYE AFTER SURG (MISCELLANEOUS) ×3 IMPLANT
PACK OPTHALMIC (MISCELLANEOUS) ×3 IMPLANT
RING MALYGIN 7.0 (MISCELLANEOUS) IMPLANT
SUT ETHILON 10-0 CS-B-6CS-B-6 (SUTURE)
SUT VICRYL  9 0 (SUTURE)
SUT VICRYL 9 0 (SUTURE) IMPLANT
SUTURE EHLN 10-0 CS-B-6CS-B-6 (SUTURE) IMPLANT
SYR 3ML LL SCALE MARK (SYRINGE) ×3 IMPLANT
SYR 5ML LL (SYRINGE) ×3 IMPLANT
SYR TB 1ML LUER SLIP (SYRINGE) ×3 IMPLANT
TECNIS SYMFONY IOL TORIC (Intraocular Lens) ×3 IMPLANT
WATER STERILE IRR 250ML POUR (IV SOLUTION) ×3 IMPLANT
WIPE NON LINTING 3.25X3.25 (MISCELLANEOUS) ×3 IMPLANT

## 2016-12-05 NOTE — H&P (Signed)
The History and Physical notes are on paper, have been signed, and are to be scanned. The patient remains stable and unchanged from the H&P.   Previous H&P reviewed, patient examined, and there are no changes.  Tansy Lorek 12/05/2016 8:53 AM

## 2016-12-05 NOTE — Anesthesia Procedure Notes (Signed)
Procedure Name: MAC Performed by: Yashas Camilli, CRNA Pre-anesthesia Checklist: Patient identified, Emergency Drugs available, Suction available, Timeout performed and Patient being monitored Patient Re-evaluated:Patient Re-evaluated prior to induction Oxygen Delivery Method: Nasal cannula Placement Confirmation: positive ETCO2       

## 2016-12-05 NOTE — Anesthesia Preprocedure Evaluation (Signed)
Anesthesia Evaluation  Patient identified by MRN, date of birth, ID band Patient awake    Reviewed: Allergy & Precautions, H&P , NPO status , Patient's Chart, lab work & pertinent test results, reviewed documented beta blocker date and time   Airway Mallampati: I  TM Distance: >3 FB Neck ROM: full    Dental no notable dental hx.    Pulmonary neg pulmonary ROS,    Pulmonary exam normal breath sounds clear to auscultation       Cardiovascular Exercise Tolerance: Good hypertension,  Rhythm:regular Rate:Normal     Neuro/Psych negative neurological ROS  negative psych ROS   GI/Hepatic Neg liver ROS, hiatal hernia, GERD  ,  Endo/Other  negative endocrine ROS  Renal/GU negative Renal ROS  negative genitourinary   Musculoskeletal   Abdominal Normal abdominal exam  (+)   Peds  Hematology negative hematology ROS (+)   Anesthesia Other Findings   Reproductive/Obstetrics negative OB ROS                             Anesthesia Physical  Anesthesia Plan  ASA: II  Anesthesia Plan: MAC   Post-op Pain Management:    Induction:   PONV Risk Score and Plan: 2  Airway Management Planned:   Additional Equipment:   Intra-op Plan:   Post-operative Plan:   Informed Consent: I have reviewed the patients History and Physical, chart, labs and discussed the procedure including the risks, benefits and alternatives for the proposed anesthesia with the patient or authorized representative who has indicated his/her understanding and acceptance.   Dental Advisory Given  Plan Discussed with: CRNA  Anesthesia Plan Comments:         Anesthesia Quick Evaluation  

## 2016-12-05 NOTE — Transfer of Care (Signed)
Immediate Anesthesia Transfer of Care Note  Patient: Kaitlin Dean  Procedure(s) Performed: CATARACT EXTRACTION PHACO AND INTRAOCULAR LENS PLACEMENT (IOC) RIGHT SYMFONY LENS (Right )  Patient Location: PACU  Anesthesia Type: MAC  Level of Consciousness: awake, alert  and patient cooperative  Airway and Oxygen Therapy: Patient Spontanous Breathing and Patient connected to supplemental oxygen  Post-op Assessment: Post-op Vital signs reviewed, Patient's Cardiovascular Status Stable, Respiratory Function Stable, Patent Airway and No signs of Nausea or vomiting  Post-op Vital Signs: Reviewed and stable  Complications: No apparent anesthesia complications

## 2016-12-05 NOTE — Anesthesia Postprocedure Evaluation (Signed)
Anesthesia Post Note  Patient: Sigourney Portillo Rodino  Procedure(s) Performed: CATARACT EXTRACTION PHACO AND INTRAOCULAR LENS PLACEMENT (IOC) RIGHT SYMFONY LENS (Right )  Patient location during evaluation: PACU Anesthesia Type: MAC Level of consciousness: awake and alert Pain management: pain level controlled Vital Signs Assessment: post-procedure vital signs reviewed and stable Respiratory status: spontaneous breathing, nonlabored ventilation, respiratory function stable and patient connected to nasal cannula oxygen Cardiovascular status: stable and blood pressure returned to baseline Postop Assessment: no apparent nausea or vomiting Anesthetic complications: no    Alisa Graff

## 2016-12-05 NOTE — Op Note (Signed)
LOCATION:  [place]   PREOPERATIVE DIAGNOSIS:  Nuclear sclerotic cataract of the right eye.  H25.11   POSTOPERATIVE DIAGNOSIS:  Nuclear sclerotic cataract of the right eye.   PROCEDURE:  Phacoemulsification with Toric posterior chamber intraocular lens placement of the right eye.   LENS:   Implant Name Type Inv. Item Serial No. Manufacturer Lot No. LRB No. Used  TECNIS SYMFONY IOL TORIC Intraocular Lens  [id number] [employer]  Right 1     [id number] Symfony Toric intraocular lens with 1.50 diopters of cylindrical power with axis orientation at 77 degrees.   ULTRASOUND TIME: 13 % of 1 minutes, 24 seconds.  CDE 10.6   SURGEON:  Wyonia Hough, MD   ANESTHESIA: Topical with tetracaine drops and 2% Xylocaine jelly, augmented with 1% preservative-free intracameral lidocaine. .   COMPLICATIONS:  None.   DESCRIPTION OF PROCEDURE:  The patient was identified in the holding room and transported to the operating suite and placed in the supine position under the operating microscope.  The right eye was identified as the operative eye, and it was prepped and draped in the usual sterile ophthalmic fashion.    A clear-corneal paracentesis incision was made at the 12:00 position.  0.5 ml of preservative-free 1% lidocaine was injected into the anterior chamber. The anterior chamber was filled with Viscoat.  A 2.4 millimeter near clear corneal incision was then made at the 9:00 position.  A cystotome and capsulorrhexis forceps were then used to make a curvilinear capsulorrhexis.  Hydrodissection and hydrodelineation were then performed using balanced salt solution.   Phacoemulsification was then used in stop and chop fashion to remove the lens, nucleus and epinucleus.  The remaining cortex was aspirated using the irrigation and aspiration handpiece.  Provisc viscoelastic was then placed into the capsular bag to distend it for lens placement.  The Verion digital marker was used to  align the implant at the intended axis.   A Toric lens was then injected into the capsular bag.  It was rotated clockwise until the axis marks on the lens were approximately 15 degrees in the counterclockwise direction to the intended alignment.  The viscoelastic was aspirated from the eye using the irrigation aspiration handpiece.  Then, a Koch spatula through the sideport incision was used to rotate the lens in a clockwise direction until the axis markings of the intraocular lens were lined up with the Verion alignment.  Balanced salt solution was then used to hydrate the wounds. Cefuroxime 0.1 ml of a 10mg /ml solution was injected into the anterior chamber for a dose of 1 mg of intracameral antibiotic at the completion of the case.    The eye was noted to have a physiologic pressure and there was no wound leak noted.   Timolol and Brimonidine drops were applied to the eye.  The patient was taken to the recovery room in stable condition having had no complications of anesthesia or surgery.  Clide Remmers 12/05/2016, 9:44 AM

## 2016-12-06 ENCOUNTER — Encounter: Payer: Self-pay | Admitting: Ophthalmology

## 2016-12-12 ENCOUNTER — Other Ambulatory Visit: Payer: Self-pay | Admitting: Internal Medicine

## 2017-01-30 ENCOUNTER — Other Ambulatory Visit: Payer: Self-pay

## 2017-01-30 ENCOUNTER — Encounter: Payer: Self-pay | Admitting: *Deleted

## 2017-01-30 NOTE — Discharge Instructions (Signed)

## 2017-02-06 ENCOUNTER — Encounter
Admission: RE | Disposition: A | Discharge status: Home / Self Care | Payer: Self-pay | Source: Ambulatory Visit | Attending: Ophthalmology

## 2017-02-06 ENCOUNTER — Ambulatory Visit: Payer: Medicare Other | Admitting: Anesthesiology

## 2017-02-06 ENCOUNTER — Ambulatory Visit
Admission: RE | Admit: 2017-02-06 | Discharge: 2017-02-06 | Disposition: A | Discharge status: Home / Self Care | Payer: Medicare Other | Source: Ambulatory Visit | Attending: Ophthalmology | Admitting: Ophthalmology

## 2017-02-06 DIAGNOSIS — J45909 Unspecified asthma, uncomplicated: Secondary | ICD-10-CM | POA: Diagnosis not present

## 2017-02-06 DIAGNOSIS — H2511 Age-related nuclear cataract, right eye: Secondary | ICD-10-CM | POA: Insufficient documentation

## 2017-02-06 DIAGNOSIS — Z8719 Personal history of other diseases of the digestive system: Secondary | ICD-10-CM | POA: Insufficient documentation

## 2017-02-06 DIAGNOSIS — M1990 Unspecified osteoarthritis, unspecified site: Secondary | ICD-10-CM | POA: Insufficient documentation

## 2017-02-06 DIAGNOSIS — I10 Essential (primary) hypertension: Secondary | ICD-10-CM | POA: Diagnosis not present

## 2017-02-06 DIAGNOSIS — E7800 Pure hypercholesterolemia, unspecified: Secondary | ICD-10-CM | POA: Diagnosis not present

## 2017-02-06 DIAGNOSIS — Z85828 Personal history of other malignant neoplasm of skin: Secondary | ICD-10-CM | POA: Insufficient documentation

## 2017-02-06 DIAGNOSIS — M810 Age-related osteoporosis without current pathological fracture: Secondary | ICD-10-CM | POA: Insufficient documentation

## 2017-02-06 DIAGNOSIS — Z888 Allergy status to other drugs, medicaments and biological substances status: Secondary | ICD-10-CM | POA: Diagnosis not present

## 2017-02-06 DIAGNOSIS — K2270 Barrett's esophagus without dysplasia: Secondary | ICD-10-CM | POA: Insufficient documentation

## 2017-02-06 DIAGNOSIS — Z96652 Presence of left artificial knee joint: Secondary | ICD-10-CM | POA: Insufficient documentation

## 2017-02-06 DIAGNOSIS — K219 Gastro-esophageal reflux disease without esophagitis: Secondary | ICD-10-CM | POA: Diagnosis not present

## 2017-02-06 HISTORY — PX: CATARACT EXTRACTION W/PHACO: SHX586

## 2017-02-06 SURGERY — PHACOEMULSIFICATION, CATARACT, WITH IOL INSERTION
Anesthesia: Monitor Anesthesia Care | Site: Eye | Laterality: Left | Wound class: Clean

## 2017-02-06 MED ORDER — MEPERIDINE HCL 25 MG/ML IJ SOLN: 6.2500 mg | INTRAMUSCULAR | Status: DC | PRN

## 2017-02-06 MED ORDER — FENTANYL CITRATE (PF) 100 MCG/2ML IJ SOLN
INTRAMUSCULAR | Status: DC | PRN
  Administered 2017-02-06: 50 ug via INTRAVENOUS

## 2017-02-06 MED ORDER — PROMETHAZINE HCL 25 MG/ML IJ SOLN: 6.2500 mg | INTRAMUSCULAR | Status: DC | PRN

## 2017-02-06 MED ORDER — FENTANYL CITRATE (PF) 100 MCG/2ML IJ SOLN: 25.0000 ug | INTRAMUSCULAR | Status: DC | PRN

## 2017-02-06 MED ORDER — OXYCODONE HCL 5 MG PO TABS: 5.0000 mg | ORAL_TABLET | Freq: Once | ORAL | Status: DC | PRN

## 2017-02-06 MED ORDER — MIDAZOLAM HCL 2 MG/2ML IJ SOLN
INTRAMUSCULAR | Status: DC | PRN
  Administered 2017-02-06: 2 mg via INTRAVENOUS

## 2017-02-06 MED ORDER — CEFUROXIME OPHTHALMIC INJECTION 1 MG/0.1 ML
INJECTION | OPHTHALMIC | Status: DC | PRN
  Administered 2017-02-06: 0.1 mL via INTRACAMERAL

## 2017-02-06 MED ORDER — NA HYALUR & NA CHOND-NA HYALUR 0.4-0.35 ML IO KIT
PACK | INTRAOCULAR | Status: DC | PRN
  Administered 2017-02-06: 1 mL via INTRAOCULAR

## 2017-02-06 MED ORDER — ARMC OPHTHALMIC DILATING DROPS
1.0000 "application " | OPHTHALMIC | Status: DC | PRN
  Administered 2017-02-06 (×3): 1 via OPHTHALMIC

## 2017-02-06 MED ORDER — MOXIFLOXACIN HCL 0.5 % OP SOLN
1.0000 [drp] | OPHTHALMIC | Status: DC | PRN
  Administered 2017-02-06 (×3): 1 [drp] via OPHTHALMIC

## 2017-02-06 MED ORDER — BRIMONIDINE TARTRATE-TIMOLOL 0.2-0.5 % OP SOLN
OPHTHALMIC | Status: DC | PRN
  Administered 2017-02-06: 1 [drp] via OPHTHALMIC

## 2017-02-06 MED ORDER — LIDOCAINE HCL (PF) 2 % IJ SOLN
INTRAOCULAR | Status: DC | PRN
  Administered 2017-02-06: 1 mL

## 2017-02-06 MED ORDER — LACTATED RINGERS IV SOLN: INTRAVENOUS | Status: DC

## 2017-02-06 MED ORDER — OXYCODONE HCL 5 MG/5ML PO SOLN: 5.0000 mg | Freq: Once | ORAL | Status: DC | PRN

## 2017-02-06 MED ORDER — EPINEPHRINE PF 1 MG/ML IJ SOLN
INTRAMUSCULAR | Status: DC | PRN
  Administered 2017-02-06: 11:00:00 via OPHTHALMIC

## 2017-02-06 SURGICAL SUPPLY — 25 items
CANNULA ANT/CHMB 27GA (MISCELLANEOUS) ×3 IMPLANT
CARTRIDGE ABBOTT (MISCELLANEOUS) IMPLANT
GLOVE SURG LX 7.5 STRW (GLOVE) ×4
GLOVE SURG LX STRL 7.5 STRW (GLOVE) ×2 IMPLANT
GLOVE SURG TRIUMPH 8.0 PF LTX (GLOVE) ×3 IMPLANT
GOWN STRL REUS W/ TWL LRG LVL3 (GOWN DISPOSABLE) ×2 IMPLANT
GOWN STRL REUS W/TWL LRG LVL3 (GOWN DISPOSABLE) ×4
MARKER SKIN DUAL TIP RULER LAB (MISCELLANEOUS) ×3 IMPLANT
NDL RETROBULBAR .5 NSTRL (NEEDLE) IMPLANT
NEEDLE FILTER BLUNT 18X 1/2SAF (NEEDLE) ×2
NEEDLE FILTER BLUNT 18X1 1/2 (NEEDLE) ×1 IMPLANT
PACK CATARACT BRASINGTON (MISCELLANEOUS) ×3 IMPLANT
PACK EYE AFTER SURG (MISCELLANEOUS) ×3 IMPLANT
PACK OPTHALMIC (MISCELLANEOUS) ×3 IMPLANT
RING MALYGIN 7.0 (MISCELLANEOUS) IMPLANT
SUT ETHILON 10-0 CS-B-6CS-B-6 (SUTURE)
SUT VICRYL  9 0 (SUTURE)
SUT VICRYL 9 0 (SUTURE) IMPLANT
SUTURE EHLN 10-0 CS-B-6CS-B-6 (SUTURE) IMPLANT
SYR 3ML LL SCALE MARK (SYRINGE) ×3 IMPLANT
SYR 5ML LL (SYRINGE) ×3 IMPLANT
SYR TB 1ML LUER SLIP (SYRINGE) ×3 IMPLANT
TECNIS SYMFONY LENS (Intraocular Lens) ×3 IMPLANT
WATER STERILE IRR 250ML POUR (IV SOLUTION) ×3 IMPLANT
WIPE NON LINTING 3.25X3.25 (MISCELLANEOUS) ×3 IMPLANT

## 2017-02-06 NOTE — Anesthesia Postprocedure Evaluation (Signed)
Anesthesia Post Note  Patient: Kaitlin Dean  Procedure(s) Performed: CATARACT EXTRACTION PHACO AND INTRAOCULAR LENS PLACEMENT (IOC) LEFT SYMFONY LENS (Left Eye)  Patient location during evaluation: PACU Anesthesia Type: MAC Level of consciousness: awake and alert Pain management: pain level controlled Vital Signs Assessment: post-procedure vital signs reviewed and stable Respiratory status: spontaneous breathing, nonlabored ventilation, respiratory function stable and patient connected to nasal cannula oxygen Cardiovascular status: blood pressure returned to baseline and stable Postop Assessment: no apparent nausea or vomiting Anesthetic complications: no    SCOURAS, NICOLE ELAINE

## 2017-02-06 NOTE — Transfer of Care (Signed)
Immediate Anesthesia Transfer of Care Note  Patient: Kaitlin Dean  Procedure(s) Performed: CATARACT EXTRACTION PHACO AND INTRAOCULAR LENS PLACEMENT (IOC) LEFT SYMFONY LENS (Left Eye)  Patient Location: PACU  Anesthesia Type: MAC  Level of Consciousness: awake, alert  and patient cooperative  Airway and Oxygen Therapy: Patient Spontanous Breathing and Patient connected to supplemental oxygen  Post-op Assessment: Post-op Vital signs reviewed, Patient's Cardiovascular Status Stable, Respiratory Function Stable, Patent Airway and No signs of Nausea or vomiting  Post-op Vital Signs: Reviewed and stable  Complications: No apparent anesthesia complications

## 2017-02-06 NOTE — H&P (Signed)
The History and Physical notes are on paper, have been signed, and are to be scanned. The patient remains stable and unchanged from the H&P.   Previous H&P reviewed, patient examined, and there are no changes.  Kaitlin Dean 02/06/2017 9:55 AM

## 2017-02-06 NOTE — Op Note (Signed)
LOCATION:  [place]   PREOPERATIVE DIAGNOSIS:    Nuclear sclerotic cataract right eye. H25.11   POSTOPERATIVE DIAGNOSIS:  Nuclear sclerotic cataract right eye.     PROCEDURE:  Phacoemusification with posterior chamber intraocular lens placement of the right eye   LENS:   Implant Name Type Inv. Item Serial No. Manufacturer Lot No. LRB No. Used  TECNIS SYMFONY LENS Intraocular Lens  [id number] [facility]  Left 1     ZXR00 23.0 D Symfony IOL   ULTRASOUND TIME: 18 % of 0 minutes, 52 seconds.  CDE 9.6   SURGEON:  Wyonia Hough, MD   ANESTHESIA:  Topical with tetracaine drops and 2% Xylocaine jelly, augmented with 1% preservative-free intracameral lidocaine.    COMPLICATIONS:  None.   DESCRIPTION OF PROCEDURE:  The patient was identified in the holding room and transported to the operating room and placed in the supine position under the operating microscope.  The right eye was identified as the operative eye and it was prepped and draped in the usual sterile ophthalmic fashion.   A 1 millimeter clear-corneal paracentesis was made at the 12:00 position.  0.5 ml of preservative-free 1% lidocaine was injected into the anterior chamber. The anterior chamber was filled with Viscoat viscoelastic.  A 2.4 millimeter keratome was used to make a near-clear corneal incision at the 9:00 position.  A curvilinear capsulorrhexis was made with a cystotome and capsulorrhexis forceps.  Balanced salt solution was used to hydrodissect and hydrodelineate the nucleus.   Phacoemulsification was then used in stop and chop fashion to remove the lens nucleus and epinucleus.  The remaining cortex was then removed using the irrigation and aspiration handpiece. Provisc was then placed into the capsular bag to distend it for lens placement.  A lens was then injected into the capsular bag.  The remaining viscoelastic was aspirated.   Wounds were hydrated with balanced salt solution.  The anterior  chamber was inflated to a physiologic pressure with balanced salt solution.  No wound leaks were noted. Cefuroxime 0.1 ml of a 10mg /ml solution was injected into the anterior chamber for a dose of 1 mg of intracameral antibiotic at the completion of the case.   Timolol and Brimonidine drops were applied to the eye.  The patient was taken to the recovery room in stable condition without complications of anesthesia or surgery.   Kaitlin Dean 02/06/2017, 10:36 AM

## 2017-02-06 NOTE — Anesthesia Procedure Notes (Signed)
Procedure Name: MAC Date/Time: 02/06/2017 10:19 AM Performed by: Janna Arch, CRNA Pre-anesthesia Checklist: Patient identified, Emergency Drugs available, Suction available and Patient being monitored Patient Re-evaluated:Patient Re-evaluated prior to induction Oxygen Delivery Method: Nasal cannula

## 2017-02-06 NOTE — Anesthesia Preprocedure Evaluation (Signed)
Anesthesia Evaluation  Patient identified by MRN, date of birth, ID band Patient awake    Reviewed: Allergy & Precautions, H&P , NPO status , Patient's Chart, lab work & pertinent test results, reviewed documented beta blocker date and time   Airway Mallampati: I  TM Distance: >3 FB Neck ROM: full    Dental no notable dental hx.    Pulmonary neg pulmonary ROS,    Pulmonary exam normal breath sounds clear to auscultation       Cardiovascular Exercise Tolerance: Good hypertension,  Rhythm:regular Rate:Normal     Neuro/Psych negative neurological ROS  negative psych ROS   GI/Hepatic Neg liver ROS, hiatal hernia, GERD  ,  Endo/Other  negative endocrine ROS  Renal/GU negative Renal ROS  negative genitourinary   Musculoskeletal   Abdominal Normal abdominal exam  (+)   Peds  Hematology negative hematology ROS (+)   Anesthesia Other Findings   Reproductive/Obstetrics negative OB ROS                             Anesthesia Physical  Anesthesia Plan  ASA: II  Anesthesia Plan: MAC   Post-op Pain Management:    Induction:   PONV Risk Score and Plan: 2  Airway Management Planned:   Additional Equipment:   Intra-op Plan:   Post-operative Plan:   Informed Consent: I have reviewed the patients History and Physical, chart, labs and discussed the procedure including the risks, benefits and alternatives for the proposed anesthesia with the patient or authorized representative who has indicated his/her understanding and acceptance.   Dental Advisory Given  Plan Discussed with: CRNA  Anesthesia Plan Comments:         Anesthesia Quick Evaluation

## 2017-02-15 ENCOUNTER — Other Ambulatory Visit: Payer: Self-pay | Admitting: Internal Medicine

## 2017-02-19 ENCOUNTER — Ambulatory Visit: Payer: Medicare Other | Admitting: Internal Medicine

## 2017-02-19 ENCOUNTER — Other Ambulatory Visit: Payer: Self-pay | Admitting: Internal Medicine

## 2017-02-19 ENCOUNTER — Encounter: Payer: Self-pay | Admitting: Internal Medicine

## 2017-02-19 ENCOUNTER — Ambulatory Visit (INDEPENDENT_AMBULATORY_CARE_PROVIDER_SITE_OTHER): Payer: Medicare Other

## 2017-02-19 VITALS — BP 110/70 | HR 82 | Temp 98.2°F | Resp 14 | Ht 62.5 in | Wt 197.4 lb

## 2017-02-19 DIAGNOSIS — K219 Gastro-esophageal reflux disease without esophagitis: Secondary | ICD-10-CM | POA: Diagnosis not present

## 2017-02-19 DIAGNOSIS — R739 Hyperglycemia, unspecified: Secondary | ICD-10-CM

## 2017-02-19 DIAGNOSIS — Z6835 Body mass index (BMI) 35.0-35.9, adult: Secondary | ICD-10-CM

## 2017-02-19 DIAGNOSIS — I10 Essential (primary) hypertension: Secondary | ICD-10-CM | POA: Diagnosis not present

## 2017-02-19 DIAGNOSIS — E7800 Pure hypercholesterolemia, unspecified: Secondary | ICD-10-CM

## 2017-02-19 DIAGNOSIS — Z1331 Encounter for screening for depression: Secondary | ICD-10-CM | POA: Diagnosis not present

## 2017-02-19 DIAGNOSIS — Z1231 Encounter for screening mammogram for malignant neoplasm of breast: Secondary | ICD-10-CM

## 2017-02-19 DIAGNOSIS — Z0000 Encounter for general adult medical examination without abnormal findings: Secondary | ICD-10-CM

## 2017-02-19 DIAGNOSIS — K2270 Barrett's esophagus without dysplasia: Secondary | ICD-10-CM | POA: Diagnosis not present

## 2017-02-19 DIAGNOSIS — H918X9 Other specified hearing loss, unspecified ear: Secondary | ICD-10-CM

## 2017-02-19 DIAGNOSIS — I700 Atherosclerosis of aorta: Secondary | ICD-10-CM | POA: Diagnosis not present

## 2017-02-19 MED ORDER — ZOSTER VAC RECOMB ADJUVANTED 50 MCG/0.5ML IM SUSR: 0.5000 mL | Freq: Once | INTRAMUSCULAR | 0 refills | Status: AC

## 2017-02-19 NOTE — Progress Notes (Signed)
Subjective:   Kaitlin Dean is a 72 y.o. female who presents for Medicare Annual (Subsequent) preventive examination.  Review of Systems:  No ROS.  Medicare Wellness Visit. Additional risk factors are reflected in the social history.  Cardiac Risk Factors include: advanced age (>28men, >51 women);obesity (BMI >30kg/m2)     Objective:     Vitals: BP 110/70 (BP Location: Left Arm, Patient Position: Sitting, Cuff Size: Normal)   Pulse 82   Temp 98.2 F (36.8 C) (Oral)   Resp 14   Ht 5' 2.5" (1.588 m)   Wt 197 lb 6.4 oz (89.5 kg)   LMP 01/29/1993   SpO2 97%   BMI 35.53 kg/m   Body mass index is 35.53 kg/m.  Advanced Directives 02/19/2017 02/06/2017 12/05/2016 06/11/2016 02/20/2016 02/14/2015 04/30/2012  Does Patient Have a Medical Advance Directive? Yes Yes Yes Yes Yes Yes Patient has advance directive, copy not in chart  Type of Advance Directive [place];Living will [place];Living will [place];Living will [place];Living will [place];Living will [place];Living will [place];Living will  Does patient want to make changes to medical advance directive? - - - - No - Patient declined No - Patient declined -  Copy of [place] in Chart? No - copy requested No - copy requested No - copy requested No - copy requested No - copy requested No - copy requested Copy requested from family  Pre-existing out of facility DNR order (yellow form or pink MOST form) - - - - - - No    Tobacco Social History   Tobacco Use  Smoking Status Never Smoker  Smokeless Tobacco Never Used     Counseling given: Not Answered   Clinical Intake:  Pre-visit preparation completed: Yes  Pain : No/denies pain     Nutritional Status: BMI > 30  Obese Diabetes: No  How often do you need to have someone help you when you read instructions, pamphlets,  or other written materials from your doctor or pharmacy?: 1 - Never  Interpreter Needed?: No     Past Medical History:  Diagnosis Date  . 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 Barrett's esophagus   . Chicken pox   . Complication of anesthesia    woke up 15 years ago and felt like could not breathe - no complications   . Diverticulosis of colon without hemorrhage 02/28/2014  . GERD (gastroesophageal reflux disease)   . History of fractured kneecap   . History of fractured pelvis   . History of hiatal hernia   . Hypercholesterolemia   . Hypertension   . Osteoarthritis   . Osteoporosis   . Pneumonia    in past  . 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 Skin cancer    Past Surgical History:  Procedure Laterality Date  . BREAST EXCISIONAL BIOPSY Right 2000   benign  . BREAST SURGERY  2000   Biopsy  . CATARACT EXTRACTION W/PHACO Right 12/05/2016   Procedure: CATARACT EXTRACTION PHACO AND INTRAOCULAR LENS PLACEMENT ([place]) RIGHT SYMFONY LENS;  Surgeon: Leandrew Koyanagi, MD;  Location: [facility];  Service: Ophthalmology;  Laterality: Right;  . CATARACT EXTRACTION W/PHACO Left 02/06/2017   Procedure: CATARACT EXTRACTION PHACO AND INTRAOCULAR LENS PLACEMENT ([place]) LEFT SYMFONY LENS;  Surgeon: Leandrew Koyanagi, MD;  Location: [facility];  Service: Ophthalmology;  Laterality: Left;  . COLONOSCOPY    .  ESOPHAGOGASTRODUODENOSCOPY    . ESOPHAGOGASTRODUODENOSCOPY (EGD) WITH PROPOFOL N/A 06/11/2016   Procedure: ESOPHAGOGASTRODUODENOSCOPY (EGD) WITH PROPOFOL;  Surgeon: Manya Silvas, MD;  Location: [facility] ENDOSCOPY;  Service: Endoscopy;  Laterality: N/A;  . JOINT REPLACEMENT Left    knee  . PTOSIS REPAIR Bilateral 06/01/2014   Procedure: PTOSIS REPAIR;  Surgeon: Karle Starch, MD;  Location: [facility];  Service: Ophthalmology;  Laterality: Bilateral;  . SKIN CANCER EXCISION    . 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 TUBAL LIGATION     Family History  Problem Relation Age of Onset  . Hypertension Mother   . Congestive Heart Failure Mother   . Stroke Father   . Stroke Brother   . Heart failure Brother   . Sarcoidosis Sister        died of kidney failure  . Breast cancer Neg Hx   . Colon cancer Neg Hx    Social History   Socioeconomic History  . Marital status: Widowed    Spouse name: None  . Number of children: 2  . Years of education: None  . Highest education level: None  Social Needs  . Financial resource strain: None  . Food insecurity - worry: None  . Food insecurity - inability: None  . Transportation needs - medical: None  . Transportation needs - non-medical: None  Occupational History  . None  Tobacco Use  . Smoking status: Never Smoker  . Smokeless tobacco: Never Used  Substance and Sexual Activity  . Alcohol use: Yes    Alcohol/week: 0.6 oz    Types: 1 Glasses of wine per week    Comment: SOCIALLY  . Drug use: No  . Sexual activity: No  Other Topics Concern  . None  Social History Narrative  . None    Outpatient Encounter Medications as of 02/19/2017  Medication Sig  . albuterol (PROVENTIL HFA;VENTOLIN HFA) 108 (90 Base) MCG/ACT inhaler Inhale 2 puffs into the lungs every 6 (six) hours as needed for wheezing or shortness of breath.  . Cholecalciferol (VITAMIN D3 PO) Take by mouth daily.  Marland Kitchen ezetimibe-simvastatin (VYTORIN) 10-20 MG tablet TAKE ONE TABLET AT BEDTIME  . MELATONIN PO Take 1 capsule by mouth.  . Multiple Vitamin (MULTIVITAMIN) capsule Take 1 capsule by mouth daily. AM  . omeprazole (PRILOSEC) 20 MG capsule Take 20 mg by mouth daily. AM  . telmisartan (MICARDIS) 40 MG tablet TAKE ONE TABLET EVERY MORNING  . vitamin B-12 (CYANOCOBALAMIN) 1000 MCG tablet Take 1,000 mcg by mouth daily.  . Vitamin E 400 units TABS Take by mouth daily.   No  facility-administered encounter medications on file as of 02/19/2017.     Activities of Daily Living In your present state of health, do you have any difficulty performing the following activities: 02/19/2017 02/06/2017  Hearing? Y N  Comment C/O difficulty hearing conversational tones.  Audiology referral placed per patient preference. -  Vision? N N  Difficulty concentrating or making decisions? N N  Walking or climbing stairs? Y N  Comment SOB on exertion when climbing stairs. -  Dressing or bathing? N N  Doing errands, shopping? N -  Preparing Food and eating ? N -  Using the Toilet? N -  In the past six months, have you accidently leaked urine? N -  Do you have problems with loss of bowel control?  N -  Managing your Medications? N -  Managing your Finances? N -  Housekeeping or managing your Housekeeping? N -  Some recent data might be hidden    Patient Care Team: Einar Pheasant, MD as PCP - General (Internal Medicine)    Assessment:   This is a routine wellness examination for Andreya. The goal of the wellness visit is to assist the patient how to close the gaps in care and create a preventative care plan for the patient.   The roster of all physicians providing medical care to patient is listed in the Snapshot section of the chart.  Taking calcium VIT D as appropriate/Osteoporosis risk reviewed.    Safety issues reviewed; Smoke and carbon monoxide detectors in the home. No firearms in the home.  Wears seatbelts when driving or riding with others. Patient does wear sunscreen or protective clothing when in direct sunlight. No violence in the home.  Depression- PHQ 2 &9 complete.  No signs/symptoms or verbal communication regarding little pleasure in doing things, feeling down, depressed or hopeless. No changes in sleeping, energy, eating, concentrating.  No thoughts of self harm or harm towards others.  Time spent on this topic is 8 minutes.   Patient is alert, normal  appearance, oriented to person/place/and time. Correctly identified the president of the [place], recall of 3/3 words, and performing simple calculations. Displays appropriate judgement and can read correct time from watch face.   No new identified risk were noted.  No failures at ADL's or IADL's.    BMI- discussed the importance of a healthy diet, water intake and the benefits of aerobic exercise. Educational material provided. She has a healthy diet and plans to increase her water intake.    24 hour diet recall: Breakfast: cereal, fruit, nuts Lunch: vegetable soup Dinner: pork loin, mashed potatoes, green vegetable  Dental- every 6 months.  Dr.Kemp.  Eye- Visual acuity not assessed per patient preference since they have regular follow up with the ophthalmologist.  Hearing- complaints of difficulty hearing conversational tones, unspecified ear.    Sleep patterns- Sleeps 7 hours at night.  Wakes feeling rested.  Health maintenance gaps- closed.  Patient Concerns: Hearing- complaints of difficulty hearing conversational tones, unspecified ear.  Audiology referral placed per patient preference. Deferred to PCP for follow up.  Exercise Activities and Dietary recommendations Current Exercise Habits: The patient does not participate in regular exercise at present;Home exercise routine, Type of exercise: walking, Time (Minutes): 15, Frequency (Times/Week): 7, Weekly Exercise (Minutes/Week): 105, Intensity: Mild  Goals    . Healthy Lifestyle     Stay hydrated and drink more water daily. Make healthy food choices.    . Increase physical activity     Water aerobics Silver sneaker program       Fall Risk Fall Risk  02/19/2017 02/20/2016 08/11/2015 02/14/2015 09/01/2014  Falls in the past year? No No No No No   Depression Screen PHQ 2/9 Scores 02/19/2017 08/17/2016 02/20/2016 08/11/2015  PHQ - 2 Score 0 0 0 0  PHQ- 9 Score 0 1 - -     Cognitive Function MMSE - Mini Mental State Exam  02/19/2017 02/14/2015  Orientation to time 5 5  Orientation to Place 5 5  Registration 3 3  Attention/ Calculation 5 5  Recall 3 3  Language- name 2 objects 2 2  Language- repeat 1 1  Language- follow 3 step command 3 3  Language- read & follow direction 1 1  Write a sentence 1 1  Copy design 1 1  Total score 30 30     6CIT Screen 02/20/2016  What Year? 0 points  What month? 0 points  What time? 0 points  Count back from 20 0 points  Months in reverse 0 points  Repeat phrase 0 points  Total Score 0    Immunization History  Administered Date(s) Administered  . Influenza Split 10/30/2011, 11/14/2012  . Influenza, High Dose Seasonal PF 11/01/2015, 11/09/2016  . Influenza,inj,Quad PF,6+ Mos 11/12/2013, 01/18/2015  . Influenza-Unspecified 11/23/2011  . Pneumococcal Conjugate-13 08/27/2013  . Zoster 03/29/2010   Screening Tests Health Maintenance  Topic Date Due  . MAMMOGRAM  03/12/2017  . TETANUS/TDAP  01/30/2020  . COLONOSCOPY  11/24/2023  . INFLUENZA VACCINE  Completed  . DEXA SCAN  Completed  . Hepatitis C Screening  Completed      Plan:    End of life planning; Advance aging; Advanced directives discussed. Copy of current HCPOA/Living Will requested.    I have personally reviewed and noted the following in the patient's chart:   . Medical and social history . Use of alcohol, tobacco or illicit drugs  . Current medications and supplements . Functional ability and status . Nutritional status . Physical activity . Advanced directives . List of other physicians . Hospitalizations, surgeries, and ER visits in previous 12 months . Vitals . Screenings to include cognitive, depression, and falls . Referrals and appointments  In addition, I have reviewed and discussed with patient certain preventive protocols, quality metrics, and best practice recommendations. A written personalized care plan for preventive services as well as general preventive health  recommendations were provided to patient.     Varney Biles, LPN  7/34/2876   Reviewed above information.  Discussed with pt.  Referral for audiology placed.    Dr Nicki Reaper

## 2017-02-19 NOTE — Patient Instructions (Addendum)
Kaitlin Dean , Thank you for taking time to come for your Medicare Wellness Visit. I appreciate your ongoing commitment to your health goals. Please review the following plan we discussed and let me know if I can assist you in the future.   Follow up with Dr. Nicki Reaper as needed.    Bring a copy of your [place]ela and/or Living Will to be scanned into chart.  Audiology referral placed; follow as directed.  Have a great day!  These are the goals we discussed: Goals    . Healthy Lifestyle     Stay hydrated and drink more water daily. Make healthy food choices.    . Increase physical activity     Water aerobics Silver sneaker program       This is a list of the screening recommended for you and due dates:  Health Maintenance  Topic Date Due  . Mammogram  03/12/2017  . Tetanus Vaccine  01/30/2020  . Colon Cancer Screening  11/24/2023  . Flu Shot  Completed  . DEXA scan (bone density measurement)  Completed  .  Hepatitis C: One time screening is recommended by Center for Disease Control  (CDC) for  adults born from [birth year] through 1967.   Completed    Hearing Loss Hearing loss is a partial or total loss of the ability to hear. This can be temporary or permanent, and it can happen in one or both ears. Hearing loss may be referred to as deafness. Medical care is necessary to treat hearing loss properly and to prevent the condition from getting worse. Your hearing may partially or completely come back, depending on what caused your hearing loss and how severe it is. In some cases, hearing loss is permanent. What are the causes? Common causes of hearing loss include:  Too much wax in the ear canal.  Infection of the ear canal or middle ear.  Fluid in the middle ear.  Injury to the ear or surrounding area.  An object stuck in the ear.  Prolonged exposure to loud sounds, such as music.  Less common causes of hearing loss include:  Tumors in the ear.  Viral or  bacterial infections, such as meningitis.  A hole in the eardrum (perforated eardrum).  Problems with the hearing nerve that sends signals between the brain and the ear.  Certain medicines.  What are the signs or symptoms? Symptoms of this condition may include:  Difficulty telling the difference between sounds.  Difficulty following a conversation when there is background noise.  Lack of response to sounds in your environment. This may be most noticeable when you do not respond to startling sounds.  Needing to turn up the volume on the television, radio, etc.  Ringing in the ears.  Dizziness.  Pain in the ears.  How is this diagnosed? This condition is diagnosed based on a physical exam and a hearing test (audiometry). The audiometry test will be performed by a hearing specialist (audiologist). You may also be referred to an ear, nose, and throat (ENT) specialist (otolaryngologist). How is this treated? Treatment for recent onset of hearing loss may include:  Ear wax removal.  Being prescribed medicines to prevent infection (antibiotics).  Being prescribed medicines to reduce inflammation (corticosteroids).  Follow these instructions at home:  If you were prescribed an antibiotic medicine, take it as told by your health care provider. Do not stop taking the antibiotic even if you start to feel better.  Take over-the-counter and  prescription medicines only as told by your health care provider.  Avoid loud noises.  Return to your normal activities as told by your health care provider. Ask your health care provider what activities are safe for you.  Keep all follow-up visits as told by your health care provider. This is important. Contact a health care provider if:  You feel dizzy.  You develop new symptoms.  You vomit or feel nauseous.  You have a fever. Get help right away if:  You develop sudden changes in your vision.  You have severe ear pain.  You  have new or increased weakness.  You have a severe headache. This information is not intended to replace advice given to you by your health care provider. Make sure you discuss any questions you have with your health care provider.

## 2017-02-19 NOTE — Progress Notes (Signed)
Patient ID: Kaitlin Dean, female   DOB: 1945/09/14, 72 y.o.   MRN: [id number]   Subjective:    Patient ID: Kaitlin Dean, female    DOB: [birth year], 72 y.o.   MRN: [id number]  HPI  Patient here for a scheduled follow up.  She reports she is doing well.  Feels good.  Family has moved closer to her.  Getting to see them more.  Stays active.  No chest pain.  No sob.  Acid reflux controlled.  No abdominal pain.  Bowels moving.  Seeing GI - for f/u of Barretts.  Had EGD 06/11/16.  Recommended f/u EGD 05/2019.     Past Medical History:  Diagnosis Date  . 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 Barrett's esophagus   . Chicken pox   . Complication of anesthesia    woke up 15 years ago and felt like could not breathe - no complications   . Diverticulosis of colon without hemorrhage 02/28/2014  . GERD (gastroesophageal reflux disease)   . History of fractured kneecap   . History of fractured pelvis   . History of hiatal hernia   . Hypercholesterolemia   . Hypertension   . Osteoarthritis   . Osteoporosis   . Pneumonia    in past  . 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 Skin cancer    Past Surgical History:  Procedure Laterality Date  . BREAST EXCISIONAL BIOPSY Right 2000   benign  . BREAST SURGERY  2000   Biopsy  . CATARACT EXTRACTION W/PHACO Right 12/05/2016   Procedure: CATARACT EXTRACTION PHACO AND INTRAOCULAR LENS PLACEMENT ([place]) RIGHT SYMFONY LENS;  Surgeon: Leandrew Koyanagi, MD;  Location: [place];  Service: Ophthalmology;  Laterality: Right;  . CATARACT EXTRACTION W/PHACO Left 02/06/2017   Procedure: CATARACT EXTRACTION PHACO AND INTRAOCULAR LENS PLACEMENT ([place]) LEFT SYMFONY LENS;  Surgeon: Leandrew Koyanagi, MD;  Location: [place];  Service: Ophthalmology;  Laterality: Left;  . COLONOSCOPY    . ESOPHAGOGASTRODUODENOSCOPY    . ESOPHAGOGASTRODUODENOSCOPY (EGD) WITH PROPOFOL N/A 06/11/2016   Procedure: ESOPHAGOGASTRODUODENOSCOPY (EGD) WITH  PROPOFOL;  Surgeon: Manya Silvas, MD;  Location[facility];  Service: Endoscopy;  Laterality: N/A;  . JOINT REPLACEMENT Left    knee  . PTOSIS REPAIR Bilateral 06/01/2014   Procedure: PTOSIS REPAIR;  Surgeon: Karle Starch, MD;  Location: [place];  Service: Ophthalmology;  Laterality: Bilateral;  . SKIN CANCER EXCISION    . TOTAL KNEE ARTHROPLASTY Left 04/30/2012   Procedure: TOTAL KNEE ARTHROPLASTY;  Surgeon: Gearlean Alf, MD;  Location: [facility];  Service: Orthopedics;  Laterality: Left;  . TUBAL LIGATION     Family History  Problem Relation Age of Onset  . Hypertension Mother   . Congestive Heart Failure Mother   . Stroke Father   . Stroke Brother   . Heart failure Brother   . Sarcoidosis Sister        died of kidney failure  . Breast cancer Neg Hx   . Colon cancer Neg Hx    Social History   Socioeconomic History  . Marital status: Widowed    Spouse name: None  . Number of children: 2  . Years of education: None  . Highest education level: None  Social Needs  . Financial resource strain: None  . Food insecurity - worry: None  . Food insecurity - inability: None  . Transportation needs - medical: None  . Transportation needs - non-medical: None  Occupational History  . None  Tobacco Use  . Smoking status: Never Smoker  . Smokeless tobacco: Never Used  Substance and Sexual Activity  . Alcohol use: Yes    Alcohol/week: 0.6 oz    Types: 1 Glasses of wine per week    Comment: SOCIALLY  . Drug use: No  . Sexual activity: No  Other Topics Concern  . None  Social History Narrative  . None    Outpatient Encounter Medications as of 02/19/2017  Medication Sig  . albuterol (PROVENTIL HFA;VENTOLIN HFA) 108 (90 Base) MCG/ACT inhaler Inhale 2 puffs into the lungs every 6 (six) hours as needed for wheezing or shortness of breath.  . Cholecalciferol (VITAMIN D3 PO) Take by mouth daily.  Marland Kitchen ezetimibe-simvastatin (VYTORIN) 10-20 MG tablet TAKE ONE TABLET AT  BEDTIME  . Multiple Vitamin (MULTIVITAMIN) capsule Take 1 capsule by mouth daily. AM  . omeprazole (PRILOSEC) 20 MG capsule Take 20 mg by mouth daily. AM  . telmisartan (MICARDIS) 40 MG tablet TAKE ONE TABLET EVERY MORNING  . vitamin B-12 (CYANOCOBALAMIN) 1000 MCG tablet Take 1,000 mcg by mouth daily.  . Vitamin E 400 units TABS Take by mouth daily.  . [EXPIRED] Zoster Vaccine Adjuvanted [facility]) injection Inject 0.5 mLs into the muscle once for 1 dose.   No facility-administered encounter medications on file as of 02/19/2017.     Review of Systems  Constitutional: Negative for appetite change and unexpected weight change.  HENT: Negative for congestion and sinus pressure.   Respiratory: Negative for cough, chest tightness and shortness of breath.   Cardiovascular: Negative for chest pain, palpitations and leg swelling.  Gastrointestinal: Negative for abdominal pain, diarrhea, nausea and vomiting.  Genitourinary: Negative for difficulty urinating and dysuria.  Musculoskeletal: Negative for back pain and joint swelling.  Skin: Negative for color change and rash.  Neurological: Negative for dizziness, light-headedness and headaches.  Psychiatric/Behavioral: Negative for agitation and dysphoric mood.       Objective:    Physical Exam  Constitutional: She appears well-developed and well-nourished. No distress.  HENT:  Nose: Nose normal.  Mouth/Throat: Oropharynx is clear and moist.  Neck: Neck supple. No thyromegaly present.  Cardiovascular: Normal rate and regular rhythm.  Pulmonary/Chest: Breath sounds normal. No respiratory distress. She has no wheezes.  Abdominal: Soft. Bowel sounds are normal. There is no tenderness.  Musculoskeletal: She exhibits no edema or tenderness.  Lymphadenopathy:    She has no cervical adenopathy.  Skin: No rash noted. No erythema.  Psychiatric: She has a normal mood and affect. Her behavior is normal.    BP 110/70   Pulse 82   Temp 98.2 F  (36.8 C) (Oral)   Wt 197 lb 5 oz (89.5 kg)   LMP 01/29/1993   SpO2 97%   BMI 35.51 kg/m  Wt Readings from Last 3 Encounters:  02/19/17 197 lb 5 oz (89.5 kg)  02/19/17 197 lb 6.4 oz (89.5 kg)  02/06/17 195 lb (88.5 kg)     Lab Results  Component Value Date   WBC 4.9 08/17/2016   HGB 15.1 (H) 08/17/2016   HCT 45.3 08/17/2016   PLT 264.0 08/17/2016   GLUCOSE 127 (H) 02/21/2017   CHOL 144 02/21/2017   TRIG 100.0 02/21/2017   HDL 46.10 02/21/2017   LDLDIRECT 182.4 02/05/2012   LDLCALC 78 02/21/2017   ALT 24 02/21/2017   AST 22 02/21/2017   NA 138 02/21/2017   K 3.8 02/21/2017   CL 104 02/21/2017   CREATININE 0.92  02/21/2017   BUN 18 02/21/2017   CO2 28 02/21/2017   TSH 3.83 08/17/2016   INR 0.99 04/22/2012   HGBA1C 6.1 02/21/2017       Assessment & Plan:   Problem List Items Addressed This Visit    Aortic atherosclerosis ([place])    On vytorin.       Barrett's esophagus    Followed by GI as outlined.        BMI 35.0-35.9,adult    Discussed diet and exercise.  Follow.       GERD (gastroesophageal reflux disease)    Being followed by GI.  Has Barretts.  Planned for f/u EGD - 2021.        Hypercholesterolemia    On vytorin.  Low cholesterol diet and exercise.  Follow lipid panel and liver function tests.        Relevant Orders   Hepatic function panel   Lipid panel   Hyperglycemia    Low carb diet and exercise.  Follow.       Relevant Orders   Hemoglobin A1c   Hypertension    Blood pressure under good control.  Continue same medication regimen.  Follow pressures.  Follow metabolic panel.        Relevant Orders   Basic metabolic panel       Einar Pheasant, MD

## 2017-02-20 ENCOUNTER — Telehealth: Payer: Self-pay | Admitting: Radiology

## 2017-02-20 ENCOUNTER — Other Ambulatory Visit: Payer: Self-pay | Admitting: Internal Medicine

## 2017-02-20 DIAGNOSIS — E7800 Pure hypercholesterolemia, unspecified: Secondary | ICD-10-CM

## 2017-02-20 DIAGNOSIS — R739 Hyperglycemia, unspecified: Secondary | ICD-10-CM

## 2017-02-20 DIAGNOSIS — I10 Essential (primary) hypertension: Secondary | ICD-10-CM

## 2017-02-20 NOTE — Progress Notes (Signed)
Orders placed for f/u labs.  

## 2017-02-20 NOTE — Telephone Encounter (Signed)
Pt coming in for labs tomorrow, please place future orders. Thank you.  

## 2017-02-20 NOTE — Telephone Encounter (Signed)
Orders placed for labs

## 2017-02-21 ENCOUNTER — Other Ambulatory Visit (INDEPENDENT_AMBULATORY_CARE_PROVIDER_SITE_OTHER): Payer: Medicare Other

## 2017-02-21 DIAGNOSIS — E7800 Pure hypercholesterolemia, unspecified: Secondary | ICD-10-CM | POA: Diagnosis not present

## 2017-02-21 DIAGNOSIS — I10 Essential (primary) hypertension: Secondary | ICD-10-CM

## 2017-02-21 DIAGNOSIS — R739 Hyperglycemia, unspecified: Secondary | ICD-10-CM | POA: Diagnosis not present

## 2017-02-21 LAB — LIPID PANEL
CHOL/HDL RATIO: 3
Cholesterol: 144 mg/dL (ref 0–200)
HDL: 46.1 mg/dL (ref >39.00)
LDL CALC: 78 mg/dL (ref 0–99)
NONHDL: 97.98
TRIGLYCERIDES: 100 mg/dL (ref 0.0–149.0)
VLDL: 20 mg/dL (ref 0.0–40.0)

## 2017-02-21 LAB — BASIC METABOLIC PANEL
BUN: 18 mg/dL (ref 6–23)
CALCIUM: 9.4 mg/dL (ref 8.4–10.5)
CO2: 28 meq/L (ref 19–32)
CREATININE: 0.92 mg/dL (ref 0.40–1.20)
Chloride: 104 mEq/L (ref 96–112)
GFR: 63.44 mL/min (ref >60.00)
GLUCOSE: 127 mg/dL — AB (ref 70–99)
Potassium: 3.8 mEq/L (ref 3.5–5.1)
Sodium: 138 mEq/L (ref 135–145)

## 2017-02-21 LAB — HEPATIC FUNCTION PANEL
ALK PHOS: 77 U/L (ref 39–117)
ALT: 24 U/L (ref 0–35)
AST: 22 U/L (ref 0–37)
Albumin: 4.4 g/dL (ref 3.5–5.2)
BILIRUBIN DIRECT: 0.2 mg/dL (ref 0.0–0.3)
BILIRUBIN TOTAL: 1.1 mg/dL (ref 0.2–1.2)
Total Protein: 7.1 g/dL (ref 6.0–8.3)

## 2017-02-21 LAB — HEMOGLOBIN A1C: Hgb A1c MFr Bld: 6.1 % (ref 4.6–6.5)

## 2017-02-22 ENCOUNTER — Encounter: Payer: Self-pay | Admitting: Internal Medicine

## 2017-02-22 NOTE — Assessment & Plan Note (Signed)
Being followed by GI.  Has Barretts.  Planned for f/u EGD - 2021.

## 2017-02-22 NOTE — Assessment & Plan Note (Signed)
On vytorin.  Low cholesterol diet and exercise.  Follow lipid panel and liver function tests.   

## 2017-02-22 NOTE — Assessment & Plan Note (Signed)
Low carb diet and exercise.  Follow.  

## 2017-02-22 NOTE — Assessment & Plan Note (Signed)
On vytorin.  

## 2017-02-22 NOTE — Assessment & Plan Note (Signed)
Blood pressure under good control.  Continue same medication regimen.  Follow pressures.  Follow metabolic panel.   

## 2017-02-22 NOTE — Assessment & Plan Note (Signed)
Discussed diet and exercise.  Follow.  

## 2017-02-22 NOTE — Assessment & Plan Note (Signed)
Followed by GI as outlined.

## 2017-03-13 ENCOUNTER — Ambulatory Visit
Admission: RE | Admit: 2017-03-13 | Discharge: 2017-03-13 | Disposition: A | Discharge status: Home / Self Care | Payer: Medicare Other | Source: Ambulatory Visit | Attending: Internal Medicine | Admitting: Internal Medicine

## 2017-03-13 DIAGNOSIS — Z1231 Encounter for screening mammogram for malignant neoplasm of breast: Secondary | ICD-10-CM | POA: Insufficient documentation

## 2017-04-19 ENCOUNTER — Other Ambulatory Visit: Payer: Self-pay | Admitting: Internal Medicine

## 2017-06-20 ENCOUNTER — Other Ambulatory Visit: Payer: Self-pay | Admitting: Internal Medicine

## 2017-07-01 ENCOUNTER — Telehealth: Payer: Self-pay

## 2017-07-01 NOTE — Telephone Encounter (Signed)
Copied from [place] [id number]. Topic: Referral - Request >> Jul 01, 2017  3:19 PM Bea Graff, NT wrote: Reason for CRM: Pt is needing a referral to an orthopedic dr, Dr. Mack Guise at [facility] for a fall she had last week. Fax#: [phone]

## 2017-07-02 ENCOUNTER — Other Ambulatory Visit: Payer: Self-pay | Admitting: Internal Medicine

## 2017-07-02 DIAGNOSIS — M79602 Pain in left arm: Secondary | ICD-10-CM

## 2017-07-02 DIAGNOSIS — W19XXXA Unspecified fall, initial encounter: Secondary | ICD-10-CM

## 2017-07-02 NOTE — Telephone Encounter (Signed)
I have placed the order for the referral to ortho.  Per message, she already has appt, just needs referral.  Once done, will you send this back to [place] so she can notify pt and confirm that she is doing ok.

## 2017-07-02 NOTE — Progress Notes (Signed)
Order placed for ortho referral.   

## 2017-07-02 NOTE — Telephone Encounter (Signed)
Patient fell last week on left side. Has limited movement in left arm. Was evaluated at [facility]. The x-rays show no break or fx. Patient is requesting referral to ortho to Dr. Mack Guise in [place]. No other acute symptoms noted. She has an appt set up with them on Friday at 9:00

## 2017-07-03 NOTE — Telephone Encounter (Signed)
Patient is aware and stated that she is feeling better and feels that arm is getting some better but still has limited mobility in arm.

## 2017-07-03 NOTE — Telephone Encounter (Signed)
It has been sent!

## 2017-08-20 ENCOUNTER — Other Ambulatory Visit (INDEPENDENT_AMBULATORY_CARE_PROVIDER_SITE_OTHER): Payer: Medicare Other

## 2017-08-20 DIAGNOSIS — R739 Hyperglycemia, unspecified: Secondary | ICD-10-CM | POA: Diagnosis not present

## 2017-08-20 DIAGNOSIS — I10 Essential (primary) hypertension: Secondary | ICD-10-CM | POA: Diagnosis not present

## 2017-08-20 DIAGNOSIS — E7800 Pure hypercholesterolemia, unspecified: Secondary | ICD-10-CM | POA: Diagnosis not present

## 2017-08-20 LAB — HEPATIC FUNCTION PANEL
ALK PHOS: 81 U/L (ref 39–117)
ALT: 22 U/L (ref 0–35)
AST: 19 U/L (ref 0–37)
Albumin: 4.3 g/dL (ref 3.5–5.2)
Bilirubin, Direct: 0.1 mg/dL (ref 0.0–0.3)
Total Bilirubin: 1 mg/dL (ref 0.2–1.2)
Total Protein: 6.9 g/dL (ref 6.0–8.3)

## 2017-08-20 LAB — BASIC METABOLIC PANEL
BUN: 20 mg/dL (ref 6–23)
CO2: 26 mEq/L (ref 19–32)
Calcium: 9.3 mg/dL (ref 8.4–10.5)
Chloride: 105 mEq/L (ref 96–112)
Creatinine, Ser: 0.91 mg/dL (ref 0.40–1.20)
GFR: 64.16 mL/min (ref >60.00)
Glucose, Bld: 117 mg/dL — ABNORMAL HIGH (ref 70–99)
Potassium: 3.7 mEq/L (ref 3.5–5.1)
SODIUM: 139 meq/L (ref 135–145)

## 2017-08-20 LAB — LIPID PANEL
Cholesterol: 137 mg/dL (ref 0–200)
HDL: 39.7 mg/dL (ref >39.00)
LDL Cholesterol: 72 mg/dL (ref 0–99)
NONHDL: 97.61
Total CHOL/HDL Ratio: 3
Triglycerides: 126 mg/dL (ref 0.0–149.0)
VLDL: 25.2 mg/dL (ref 0.0–40.0)

## 2017-08-20 LAB — HEMOGLOBIN A1C: Hgb A1c MFr Bld: 6.1 % (ref 4.6–6.5)

## 2017-08-21 ENCOUNTER — Ambulatory Visit (INDEPENDENT_AMBULATORY_CARE_PROVIDER_SITE_OTHER): Payer: Medicare Other | Admitting: Internal Medicine

## 2017-08-21 ENCOUNTER — Encounter: Payer: Self-pay | Admitting: Internal Medicine

## 2017-08-21 VITALS — BP 124/70 | HR 80 | Temp 98.0°F | Resp 18 | Ht 63.0 in | Wt 197.0 lb

## 2017-08-21 DIAGNOSIS — Z0000 Encounter for general adult medical examination without abnormal findings: Secondary | ICD-10-CM

## 2017-08-21 DIAGNOSIS — Z8619 Personal history of other infectious and parasitic diseases: Secondary | ICD-10-CM

## 2017-08-21 DIAGNOSIS — K219 Gastro-esophageal reflux disease without esophagitis: Secondary | ICD-10-CM

## 2017-08-21 DIAGNOSIS — Z23 Encounter for immunization: Secondary | ICD-10-CM | POA: Diagnosis not present

## 2017-08-21 DIAGNOSIS — I700 Atherosclerosis of aorta: Secondary | ICD-10-CM

## 2017-08-21 DIAGNOSIS — I10 Essential (primary) hypertension: Secondary | ICD-10-CM

## 2017-08-21 DIAGNOSIS — K2270 Barrett's esophagus without dysplasia: Secondary | ICD-10-CM | POA: Diagnosis not present

## 2017-08-21 DIAGNOSIS — E7800 Pure hypercholesterolemia, unspecified: Secondary | ICD-10-CM

## 2017-08-21 DIAGNOSIS — C4490 Unspecified malignant neoplasm of skin, unspecified: Secondary | ICD-10-CM

## 2017-08-21 DIAGNOSIS — R739 Hyperglycemia, unspecified: Secondary | ICD-10-CM

## 2017-08-21 NOTE — Assessment & Plan Note (Signed)
Physical today 08/21/17.  PAP through gyn.  Mammogram 03/13/17 - Birads I.  Colonoscopy 11/23/13 - diverticulosis and internal hemorrhoids.

## 2017-08-21 NOTE — Progress Notes (Signed)
Patient ID: Kaitlin Dean, female   DOB: 06-Mar-1945, 72 y.o.   MRN: [id number]   Subjective:    Patient ID: Kaitlin Dean, female    DOB: September 26, 1945, 72 y.o.   MRN: [id number]  HPI  Patient here for her physical exam.  She fell at the beginning of June.  Saw [facility] for left shoulder pain.  Discussed MRI.  She elected to follow.  Doing her own exercises.  Better.  Still some limited rom.  Still some left upper arm and left elbow pain.  Is better.    She is exercising.  No chest pain.  No sob.  No acid reflux reported.  Followed by GI.  She is due to f/u with dermatology regarding f/u squamous cell cancer.  No abdominal pain.  Bowels moving.  States up to date with gyn.     Past Medical History:  Diagnosis Date  . 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 Barrett's esophagus   . Chicken pox   . Complication of anesthesia    woke up 15 years ago and felt like could not breathe - no complications   . Diverticulosis of colon without hemorrhage 02/28/2014  . GERD (gastroesophageal reflux disease)   . History of fractured kneecap   . History of fractured pelvis   . History of hiatal hernia   . Hypercholesterolemia   . Hypertension   . Osteoarthritis   . Osteoporosis   . Pneumonia    in past  . 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 Skin cancer    Past Surgical History:  Procedure Laterality Date  . BREAST EXCISIONAL BIOPSY Right 2000   benign  . BREAST SURGERY  2000   Biopsy  . CATARACT EXTRACTION W/PHACO Right 12/05/2016   Procedure: CATARACT EXTRACTION PHACO AND INTRAOCULAR LENS PLACEMENT ([place]) RIGHT SYMFONY LENS;  Surgeon: Leandrew Koyanagi, MD;  Location: [place];  Service: Ophthalmology;  Laterality: Right;  . CATARACT EXTRACTION W/PHACO Left 02/06/2017   Procedure: CATARACT EXTRACTION PHACO AND INTRAOCULAR LENS PLACEMENT ([place]) LEFT SYMFONY LENS;  Surgeon: Leandrew Koyanagi, MD;  Location: [place];  Service: Ophthalmology;  Laterality:  Left;  . COLONOSCOPY    . ESOPHAGOGASTRODUODENOSCOPY    . ESOPHAGOGASTRODUODENOSCOPY (EGD) WITH PROPOFOL N/A 06/11/2016   Procedure: ESOPHAGOGASTRODUODENOSCOPY (EGD) WITH PROPOFOL;  Surgeon: Manya Silvas, MD;  Location[facility];  Service: Endoscopy;  Laterality: N/A;  . JOINT REPLACEMENT Left    knee  . PTOSIS REPAIR Bilateral 06/01/2014   Procedure: PTOSIS REPAIR;  Surgeon: Karle Starch, MD;  Location: [place];  Service: Ophthalmology;  Laterality: Bilateral;  . SKIN CANCER EXCISION    . TOTAL KNEE ARTHROPLASTY Left 04/30/2012   Procedure: TOTAL KNEE ARTHROPLASTY;  Surgeon: Gearlean Alf, MD;  Location: [facility];  Service: Orthopedics;  Laterality: Left;  . TUBAL LIGATION     Family History  Problem Relation Age of Onset  . Hypertension Mother   . Congestive Heart Failure Mother   . Stroke Father   . Stroke Brother   . Heart failure Brother   . Sarcoidosis Sister        died of kidney failure  . Breast cancer Neg Hx   . Colon cancer Neg Hx    Social History   Socioeconomic History  . Marital status: Widowed    Spouse name: Not on file  . Number of children: 2  . Years of education: Not on file  . Highest  education level: Not on file  Occupational History  . Not on file  Social Needs  . Financial resource strain: Not on file  . Food insecurity:    Worry: Not on file    Inability: Not on file  . Transportation needs:    Medical: Not on file    Non-medical: Not on file  Tobacco Use  . Smoking status: Never Smoker  . Smokeless tobacco: Never Used  Substance and Sexual Activity  . Alcohol use: Yes    Alcohol/week: 0.6 oz    Types: 1 Glasses of wine per week    Comment: SOCIALLY  . Drug use: No  . Sexual activity: Never  Lifestyle  . Physical activity:    Days per week: Not on file    Minutes per session: Not on file  . Stress: Not on file  Relationships  . Social connections:    Talks on phone: Not on file    Gets together: Not on file     Attends religious service: Not on file    Active member of club or organization: Not on file    Attends meetings of clubs or organizations: Not on file    Relationship status: Not on file  Other Topics Concern  . Not on file  Social History Narrative  . Not on file    Outpatient Encounter Medications as of 08/21/2017  Medication Sig  . albuterol (PROVENTIL HFA;VENTOLIN HFA) 108 (90 Base) MCG/ACT inhaler Inhale 2 puffs into the lungs every 6 (six) hours as needed for wheezing or shortness of breath.  . Cholecalciferol (VITAMIN D3 PO) Take by mouth daily.  Marland Kitchen MELATONIN PO Take 1 capsule by mouth.  . Multiple Vitamin (MULTIVITAMIN) capsule Take 1 capsule by mouth daily. AM  . omeprazole (PRILOSEC) 20 MG capsule Take 20 mg by mouth daily. AM  . vitamin B-12 (CYANOCOBALAMIN) 1000 MCG tablet Take 1,000 mcg by mouth daily.  . Vitamin E 400 units TABS Take by mouth daily.  . [DISCONTINUED] ezetimibe-simvastatin (VYTORIN) 10-20 MG tablet TAKE ONE TABLET AT BEDTIME  . [DISCONTINUED] telmisartan (MICARDIS) 40 MG tablet TAKE ONE TABLET EVERY MORNING   No facility-administered encounter medications on file as of 08/21/2017.     Review of Systems  Constitutional: Negative for appetite change and unexpected weight change.  HENT: Negative for congestion and sinus pressure.   Eyes: Negative for pain and visual disturbance.  Respiratory: Negative for cough, chest tightness and shortness of breath.   Cardiovascular: Negative for chest pain, palpitations and leg swelling.  Gastrointestinal: Negative for abdominal pain, diarrhea, nausea and vomiting.  Genitourinary: Negative for difficulty urinating and dysuria.  Musculoskeletal: Negative for joint swelling and myalgias.       Persistent left upper arm/elbow s/p fall.    Skin: Negative for color change and rash.  Neurological: Negative for dizziness, light-headedness and headaches.  Hematological: Negative for adenopathy. Does not bruise/bleed easily.    Psychiatric/Behavioral: Negative for agitation and dysphoric mood.       Objective:    Physical Exam  Constitutional: She is oriented to person, place, and time. She appears well-developed and well-nourished. No distress.  HENT:  Nose: Nose normal.  Mouth/Throat: Oropharynx is clear and moist.  Eyes: Right eye exhibits no discharge. Left eye exhibits no discharge. No scleral icterus.  Neck: Neck supple. No thyromegaly present.  Cardiovascular: Normal rate and regular rhythm.  Pulmonary/Chest: Breath sounds normal. No accessory muscle usage. No tachypnea. No respiratory distress. She has no decreased breath sounds.  She has no wheezes. She has no rhonchi. Right breast exhibits no inverted nipple, no mass, no nipple discharge and no tenderness (no axillary adenopathy). Left breast exhibits no inverted nipple, no mass, no nipple discharge and no tenderness (no axilarry adenopathy).  Abdominal: Soft. Bowel sounds are normal. There is no tenderness.  Musculoskeletal: She exhibits no edema or tenderness.  Some increased pain with full extension.    Lymphadenopathy:    She has no cervical adenopathy.  Neurological: She is alert and oriented to person, place, and time.  Skin: No rash noted. No erythema.  Psychiatric: She has a normal mood and affect. Her behavior is normal.    BP 124/70 (BP Location: Left Arm, Patient Position: Sitting, Cuff Size: Normal)   Pulse 80   Temp 98 F (36.7 C) (Oral)   Resp 18   Ht '5\' 3"'  (1.6 m)   Wt 197 lb (89.4 kg)   LMP 01/29/1993   SpO2 98%   BMI 34.90 kg/m  Wt Readings from Last 3 Encounters:  08/21/17 197 lb (89.4 kg)  02/19/17 197 lb 5 oz (89.5 kg)  02/19/17 197 lb 6.4 oz (89.5 kg)     Lab Results  Component Value Date   WBC 4.9 08/17/2016   HGB 15.1 (H) 08/17/2016   HCT 45.3 08/17/2016   PLT 264.0 08/17/2016   GLUCOSE 117 (H) 08/20/2017   CHOL 137 08/20/2017   TRIG 126.0 08/20/2017   HDL 39.70 08/20/2017   LDLDIRECT 182.4 02/05/2012    LDLCALC 72 08/20/2017   ALT 22 08/20/2017   AST 19 08/20/2017   NA 139 08/20/2017   K 3.7 08/20/2017   CL 105 08/20/2017   CREATININE 0.91 08/20/2017   BUN 20 08/20/2017   CO2 26 08/20/2017   TSH 3.83 08/17/2016   INR 0.99 04/22/2012   HGBA1C 6.1 08/20/2017    Mm Screening Breast Tomo Bilateral  Result Date: 03/13/2017 CLINICAL DATA:  Screening. EXAM: DIGITAL SCREENING BILATERAL MAMMOGRAM WITH TOMO AND CAD COMPARISON:  Previous exam(s). ACR Breast Density Category a: The breast tissue is almost entirely fatty. FINDINGS: There are no findings suspicious for malignancy. Images were processed with CAD. IMPRESSION: No mammographic evidence of malignancy. A result letter of this screening mammogram will be mailed directly to the patient. RECOMMENDATION: Screening mammogram in one year. (Code:SM-B-01Y) BI-RADS CATEGORY  1: Negative. Electronically Signed   By: Curlene Dolphin M.D.   On: 03/13/2017 12:49       Assessment & Plan:   Problem List Items Addressed This Visit    Aortic atherosclerosis ([place])    On vytorin.        Barrett's esophagus    Followed by GI.       GERD (gastroesophageal reflux disease)    Being followed by GI.  Has Barretts.  Planning fo f/u EGD 2021.        Health care maintenance    Physical today 08/21/17.  PAP through gyn.  Mammogram 03/13/17 - Birads I.  Colonoscopy 11/23/13 - diverticulosis and internal hemorrhoids.        History of HPV infection    Discussed with her today.  Discussed the need to keep regular f/u with gyn.  States up to date.  Follow.        Hypercholesterolemia    Low cholesterol diet and exercise.  Follow lipid panel and liver function tests.  On vytorin.        Relevant Orders   Hepatic function panel   Lipid panel  Hyperglycemia    Low carb diet and exercise.  Follow met b and a1c.        Relevant Orders   Hemoglobin A1c   Hypertension    Blood pressure under good control.  Continue same medication regimen.  Follow  pressures.  Follow metabolic panel.        Relevant Orders   CBC with Differential/Platelet   TSH   Basic metabolic panel   Skin cancer    Seeing Dr Kellie Moor.  F/u 09/05/17 as outlined.         Other Visit Diagnoses    Routine general medical examination at a health care facility    -  Primary   Need for 23-polyvalent pneumococcal polysaccharide vaccine       Relevant Orders   Pneumococcal polysaccharide vaccine 23-valent greater than or equal to 2yo subcutaneous/IM (Completed)       Einar Pheasant, MD

## 2017-08-22 ENCOUNTER — Other Ambulatory Visit: Payer: Self-pay | Admitting: Internal Medicine

## 2017-08-24 ENCOUNTER — Encounter: Payer: Self-pay | Admitting: Internal Medicine

## 2017-08-24 NOTE — Assessment & Plan Note (Signed)
On vytorin.  

## 2017-08-24 NOTE — Assessment & Plan Note (Signed)
Followed by GI

## 2017-08-24 NOTE — Assessment & Plan Note (Signed)
Blood pressure under good control.  Continue same medication regimen.  Follow pressures.  Follow metabolic panel.   

## 2017-08-24 NOTE — Assessment & Plan Note (Signed)
Seeing Dr Kellie Moor.  F/u 09/05/17 as outlined.

## 2017-08-24 NOTE — Assessment & Plan Note (Signed)
Low cholesterol diet and exercise.  Follow lipid panel and liver function tests.  On vytorin.

## 2017-08-24 NOTE — Assessment & Plan Note (Signed)
Discussed with her today.  Discussed the need to keep regular f/u with gyn.  States up to date.  Follow.

## 2017-08-24 NOTE — Assessment & Plan Note (Signed)
Low carb diet and exercise.  Follow met b and a1c.   

## 2017-08-24 NOTE — Assessment & Plan Note (Signed)
Being followed by GI.  Has Barretts.  Planning fo f/u EGD 2021.

## 2017-09-24 ENCOUNTER — Other Ambulatory Visit: Payer: Self-pay | Admitting: Internal Medicine

## 2017-11-07 DIAGNOSIS — K449 Diaphragmatic hernia without obstruction or gangrene: Secondary | ICD-10-CM | POA: Insufficient documentation

## 2017-11-18 ENCOUNTER — Ambulatory Visit (INDEPENDENT_AMBULATORY_CARE_PROVIDER_SITE_OTHER): Payer: Medicare Other

## 2017-11-18 DIAGNOSIS — Z23 Encounter for immunization: Secondary | ICD-10-CM

## 2017-12-06 ENCOUNTER — Encounter: Payer: Self-pay | Admitting: Family Medicine

## 2017-12-06 ENCOUNTER — Ambulatory Visit: Payer: Medicare Other | Admitting: Family Medicine

## 2017-12-06 VITALS — BP 100/66 | HR 78 | Temp 98.3°F | Ht 63.0 in | Wt 196.4 lb

## 2017-12-06 DIAGNOSIS — J019 Acute sinusitis, unspecified: Secondary | ICD-10-CM

## 2017-12-06 MED ORDER — AMOXICILLIN-POT CLAVULANATE 875-125 MG PO TABS: 1.0000 | ORAL_TABLET | Freq: Two times a day (BID) | ORAL | 0 refills | Status: DC

## 2017-12-06 NOTE — Patient Instructions (Signed)

## 2017-12-06 NOTE — Progress Notes (Signed)
   Subjective:    Patient ID: Kaitlin Dean, female    DOB: 03-18-1945, 72 y.o.   MRN: [id number]  HPI  Patient presents to clinic c/o sinus congestion, pressure, runny nose for a week, getting worse over the past 3 days. States she has pressure in face, above teeth and is blowing thick yellow-green drainage from nose. States she usually has a saline nasal spray, but has not used this recently.   Patient Active Problem List   Diagnosis Date Noted  . Aortic atherosclerosis ([place]) 08/28/2016  . Abdominal pain 08/17/2016  . Throat irritation 02/20/2015  . Acute bronchitis 12/31/2014  . Hyperglycemia 09/01/2014  . Health care maintenance 08/07/2014  . BMI 35.0-35.9,adult 03/06/2014  . Diverticulosis of colon without hemorrhage 02/28/2014  . Breast pain, right 11/12/2013  . Breast thickening 11/12/2013  . Irregular heart rhythm 02/22/2013  . OA (osteoarthritis) of knee 04/30/2012  . Skin cancer 02/10/2012  . Hypertension 02/04/2012  . Hypercholesterolemia 02/04/2012  . GERD (gastroesophageal reflux disease) 02/04/2012  . History of HPV infection 02/04/2012  . Barrett's esophagus 02/04/2012   Social History   Tobacco Use  . Smoking status: Never Smoker  . Smokeless tobacco: Never Used  Substance Use Topics  . Alcohol use: Yes    Alcohol/week: 1.0 standard drinks    Types: 1 Glasses of wine per week    Comment: SOCIALLY   Review of Systems  Constitutional: Negative for chills, fatigue and fever.  HENT: +congestion, drainage from nose, sinus pain/pressure.   Eyes: Negative.   Respiratory: Negative for cough, shortness of breath and wheezing.   Cardiovascular: Negative for chest pain, palpitations and leg swelling.  Gastrointestinal: Negative for abdominal pain, diarrhea, nausea and vomiting.  Genitourinary: Negative for dysuria, frequency and urgency.  Musculoskeletal: Negative for arthralgias and myalgias.  Skin: Negative for color change, pallor and rash.  Neurological:  Negative for syncope, light-headedness and headaches.  Psychiatric/Behavioral: The patient is not nervous/anxious.       Objective:   Physical Exam  Constitutional: She is oriented to person, place, and time. She appears well-nourished. No distress.  HENT:  Head: Normocephalic and atraumatic.  Nose: Mucosal edema and rhinorrhea present. Right sinus exhibits maxillary sinus tenderness and frontal sinus tenderness. Left sinus exhibits maxillary sinus tenderness and frontal sinus tenderness.  Eyes: Conjunctivae and EOM are normal. No scleral icterus.  Neck: Neck supple. No tracheal deviation present.  Cardiovascular: Normal rate and regular rhythm.  Pulmonary/Chest: Effort normal and breath sounds normal.  Musculoskeletal: She exhibits no edema.  Neurological: She is alert and oriented to person, place, and time.  Skin: Skin is warm and dry. No pallor.  Psychiatric: She has a normal mood and affect. Her behavior is normal.  Nursing note and vitals reviewed.     Vitals:   12/06/17 1353  BP: 100/66  Pulse: 78  Temp: 98.3 F (36.8 C)  SpO2: 96%   Assessment & Plan:   Acute sinusitis -- patient will take Augmentin twice daily for 10 days.  Advised to get a saline nasal spray to help loosen up sinus congestion.  Advised rest, good handwashing, increase in fluid intake.  Keep regularly scheduled follow-up with PCP as planned.  Return to clinic sooner if any issues arise or if current symptoms persist or worsen.

## 2017-12-27 ENCOUNTER — Other Ambulatory Visit: Payer: Self-pay | Admitting: Internal Medicine

## 2018-01-24 ENCOUNTER — Encounter: Payer: Self-pay | Admitting: Family

## 2018-01-24 ENCOUNTER — Ambulatory Visit: Payer: Medicare Other | Admitting: Family

## 2018-01-24 VITALS — BP 110/78 | HR 91 | Temp 98.2°F | Ht 63.0 in | Wt 190.6 lb

## 2018-01-24 DIAGNOSIS — J40 Bronchitis, not specified as acute or chronic: Secondary | ICD-10-CM | POA: Diagnosis not present

## 2018-01-24 MED ORDER — HYDROCOD POLST-CPM POLST ER 10-8 MG/5ML PO SUER: 5.0000 mL | Freq: Every evening | ORAL | 0 refills | Status: DC | PRN

## 2018-01-24 NOTE — Progress Notes (Signed)
Subjective:    Patient ID: Kaitlin Dean, female    DOB: Dec 02, 1945, 72 y.o.   MRN: [id number]  CC: Kaitlin Dean is a 72 y.o. female who presents today for an acute visit.    HPI: CC: green with blood in congestion, cough, 5 days ago, unchanged.  Started to sneeze 6 days ago.  No cp, wheezing, sob.   Endorses fatigue while feeling sick. No depression. No si/hi.   Tried delsym without resolve. Ginger tea and ginger beer helped yesterday.   Has had neighbors whom has had cough.   H/o barrett's esophagus.   Recent antibiotics- augmentin. States completely better after treatment.   No lung disease.      HISTORY:  Past Medical History:  Diagnosis Date  . 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 Barrett's esophagus   . Chicken pox   . Complication of anesthesia    woke up 15 years ago and felt like could not breathe - no complications   . Diverticulosis of colon without hemorrhage 02/28/2014  . GERD (gastroesophageal reflux disease)   . History of fractured kneecap   . History of fractured pelvis   . History of hiatal hernia   . Hypercholesterolemia   . Hypertension   . Osteoarthritis   . Osteoporosis   . Pneumonia    in past  . 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 Skin cancer    Past Surgical History:  Procedure Laterality Date  . BREAST EXCISIONAL BIOPSY Right 2000   benign  . BREAST SURGERY  2000   Biopsy  . CATARACT EXTRACTION W/PHACO Right 12/05/2016   Procedure: CATARACT EXTRACTION PHACO AND INTRAOCULAR LENS PLACEMENT ([place]) RIGHT SYMFONY LENS;  Surgeon: Leandrew Koyanagi, MD;  Location: [facility];  Service: Ophthalmology;  Laterality: Right;  . CATARACT EXTRACTION W/PHACO Left 02/06/2017   Procedure: CATARACT EXTRACTION PHACO AND INTRAOCULAR LENS PLACEMENT ([place]) LEFT SYMFONY LENS;  Surgeon: Leandrew Koyanagi, MD;  Location: [facility];  Service: Ophthalmology;  Laterality: Left;  . COLONOSCOPY    .  ESOPHAGOGASTRODUODENOSCOPY    . ESOPHAGOGASTRODUODENOSCOPY (EGD) WITH PROPOFOL N/A 06/11/2016   Procedure: ESOPHAGOGASTRODUODENOSCOPY (EGD) WITH PROPOFOL;  Surgeon: Manya Silvas, MD;  Location: [facility];  Service: Endoscopy;  Laterality: N/A;  . JOINT REPLACEMENT Left    knee  . PTOSIS REPAIR Bilateral 06/01/2014   Procedure: PTOSIS REPAIR;  Surgeon: Karle Starch, MD;  Location: [facility];  Service: Ophthalmology;  Laterality: Bilateral;  . SKIN CANCER EXCISION    . TOTAL KNEE ARTHROPLASTY Left 04/30/2012   Procedure: TOTAL KNEE ARTHROPLASTY;  Surgeon: Gearlean Alf, MD;  Location: [facility];  Service: Orthopedics;  Laterality: Left;  . TUBAL LIGATION     Family History  Problem Relation Age of Onset  . Hypertension Mother   . Congestive Heart Failure Mother   . Stroke Father   . Stroke Brother   . Heart failure Brother   . Sarcoidosis Sister        died of kidney failure  . Breast cancer Neg Hx   . Colon cancer Neg Hx     Allergies: Flagyl [metronidazole] Current Outpatient Medications on File Prior to Visit  Medication Sig Dispense Refill  . albuterol (PROVENTIL HFA;VENTOLIN HFA) 108 (90 Base) MCG/ACT inhaler Inhale 2 puffs into the lungs every 6 (six) hours as needed for wheezing or shortness of breath.    Marland Kitchen amoxicillin-clavulanate (AUGMENTIN) 875-125 MG tablet Take 1 tablet by mouth  2 (two) times daily. 20 tablet 0  . Cholecalciferol (VITAMIN D3 PO) Take by mouth daily.    Marland Kitchen ezetimibe-simvastatin (VYTORIN) 10-20 MG tablet TAKE ONE TABLET BY MOUTH AT BEDTIME 30 tablet 1  . ezetimibe-simvastatin (VYTORIN) 10-20 MG tablet TAKE ONE TABLET BY MOUTH AT BEDTIME 30 tablet 5  . MELATONIN PO Take 1 capsule by mouth.    . Multiple Vitamin (MULTIVITAMIN) capsule Take 1 capsule by mouth daily. AM    . omeprazole (PRILOSEC) 20 MG capsule Take 20 mg by mouth daily. AM    . telmisartan (MICARDIS) 40 MG tablet TAKE ONE TABLET EVERY MORNING 30 tablet 3  . vitamin B-12  (CYANOCOBALAMIN) 1000 MCG tablet Take 1,000 mcg by mouth daily.    . Vitamin E 400 units TABS Take by mouth daily.     No current facility-administered medications on file prior to visit.     Social History   Tobacco Use  . Smoking status: Never Smoker  . Smokeless tobacco: Never Used  Substance Use Topics  . Alcohol use: Yes    Alcohol/week: 1.0 standard drinks    Types: 1 Glasses of wine per week    Comment: SOCIALLY  . Drug use: No    Review of Systems  Constitutional: Negative for chills and fever.  HENT: Positive for congestion and rhinorrhea. Negative for sinus pressure and sinus pain.   Respiratory: Positive for cough. Negative for shortness of breath and wheezing.   Cardiovascular: Negative for chest pain and palpitations.  Gastrointestinal: Negative for nausea and vomiting.      Objective:    BP 110/78   Pulse 91   Temp 98.2 F (36.8 C) (Oral)   Ht 5\' 3"  (1.6 m)   Wt 190 lb 9.6 oz (86.5 kg)   LMP 01/29/1993   SpO2 95%   BMI 33.76 kg/m    Physical Exam Vitals signs reviewed.  Constitutional:      Appearance: She is well-developed.  HENT:     Head: Normocephalic and atraumatic.     Right Ear: Hearing, tympanic membrane, ear canal and external ear normal. No decreased hearing noted. No drainage, swelling or tenderness. No middle ear effusion. No foreign body. Tympanic membrane is not erythematous or bulging.     Left Ear: Hearing, tympanic membrane, ear canal and external ear normal. No decreased hearing noted. No drainage, swelling or tenderness.  No middle ear effusion. No foreign body. Tympanic membrane is not erythematous or bulging.     Nose: Nose normal. No rhinorrhea.     Right Sinus: No maxillary sinus tenderness or frontal sinus tenderness.     Left Sinus: No maxillary sinus tenderness or frontal sinus tenderness.     Mouth/Throat:     Pharynx: Uvula midline. No oropharyngeal exudate or posterior oropharyngeal erythema.     Tonsils: No tonsillar  abscesses.  Eyes:     Conjunctiva/sclera: Conjunctivae normal.  Cardiovascular:     Rate and Rhythm: Regular rhythm.     Pulses: Normal pulses.     Heart sounds: Normal heart sounds.  Pulmonary:     Effort: Pulmonary effort is normal.     Breath sounds: Normal breath sounds. No wheezing, rhonchi or rales.  Lymphadenopathy:     Head:     Right side of head: No submental, submandibular, tonsillar, preauricular, posterior auricular or occipital adenopathy.     Left side of head: No submental, submandibular, tonsillar, preauricular, posterior auricular or occipital adenopathy.     Cervical: No cervical adenopathy.  Skin:    General: Skin is warm and dry.  Neurological:     Mental Status: She is alert.  Psychiatric:        Speech: Speech normal.        Behavior: Behavior normal.        Thought Content: Thought content normal.        Assessment & Plan:   1. Bronchitis Afebrile.  No acute respiratory distress. [id number]%.  No history of lung disease.  Duration of symptoms of 5 days.  Patient jointly agreed likely viral bronchitis.  Appropriate to treat conservatively at this time.  Cough medication as needed qhs.  Patient let me know if not better. - chlorpheniramine-HYDROcodone (TUSSIONEX PENNKINETIC ER) 10-8 MG/5ML SUER; Take 5 mLs by mouth at bedtime as needed for cough.  Dispense: 60 mL; Refill: 0    I am having Kaitlin Dean. [place] start on chlorpheniramine-HYDROcodone. I am also having her maintain her omeprazole, multivitamin, albuterol, Vitamin E, vitamin B-12, Cholecalciferol (VITAMIN D3 PO), MELATONIN PO, ezetimibe-simvastatin, ezetimibe-simvastatin, amoxicillin-clavulanate, and telmisartan.   Meds ordered this encounter  Medications  . chlorpheniramine-HYDROcodone (TUSSIONEX PENNKINETIC ER) 10-8 MG/5ML SUER    Sig: Take 5 mLs by mouth at bedtime as needed for cough.    Dispense:  60 mL    Refill:  0    Order Specific Question:   Supervising Provider    Answer:   Crecencio Mc [[id number]]    Return precautions given.   Risks, benefits, and alternatives of the medications and treatment plan prescribed today were discussed, and patient expressed understanding.   Education regarding symptom management and diagnosis given to patient on AVS.  Continue to follow with Einar Pheasant, MD for routine health maintenance.   [place] and I agreed with plan.   Mable Paris, FNP

## 2018-01-24 NOTE — Patient Instructions (Signed)
My pleasure meeting you today. Let's treat this conservatively as discussed.  Suspect viral bronchitis.  Please take cough medication at night only as needed. As we discussed, I do not recommend dosing throughout the day as coughing is a protective mechanism . It also helps to break up thick mucous.  Do not take cough suppressants with alcohol as can lead to trouble breathing. Advise caution if taking cough suppressant and operating machinery ( i.e driving a car) as you may feel very tired.   Plain mucinex. Plenty of water and rest  Let me know if not better.

## 2018-02-18 ENCOUNTER — Other Ambulatory Visit (INDEPENDENT_AMBULATORY_CARE_PROVIDER_SITE_OTHER): Payer: Medicare Other

## 2018-02-18 DIAGNOSIS — R739 Hyperglycemia, unspecified: Secondary | ICD-10-CM | POA: Diagnosis not present

## 2018-02-18 DIAGNOSIS — I10 Essential (primary) hypertension: Secondary | ICD-10-CM | POA: Diagnosis not present

## 2018-02-18 DIAGNOSIS — E7800 Pure hypercholesterolemia, unspecified: Secondary | ICD-10-CM

## 2018-02-18 LAB — CBC WITH DIFFERENTIAL/PLATELET
BASOS ABS: 0 10*3/uL (ref 0.0–0.1)
BASOS PCT: 0.5 % (ref 0.0–3.0)
EOS ABS: 0.2 10*3/uL (ref 0.0–0.7)
Eosinophils Relative: 4.3 % (ref 0.0–5.0)
HCT: 43.2 % (ref 36.0–46.0)
Hemoglobin: 14.5 g/dL (ref 12.0–15.0)
Lymphocytes Relative: 32.6 % (ref 12.0–46.0)
Lymphs Abs: 1.6 10*3/uL (ref 0.7–4.0)
MCHC: 33.6 g/dL (ref 30.0–36.0)
MCV: 91.3 fl (ref 78.0–100.0)
MONO ABS: 0.3 10*3/uL (ref 0.1–1.0)
Monocytes Relative: 6.9 % (ref 3.0–12.0)
Neutro Abs: 2.8 10*3/uL (ref 1.4–7.7)
Neutrophils Relative %: 55.7 % (ref 43.0–77.0)
Platelets: 223 10*3/uL (ref 150.0–400.0)
RBC: 4.73 Mil/uL (ref 3.87–5.11)
RDW: 13.5 % (ref 11.5–15.5)
WBC: 5 10*3/uL (ref 4.0–10.5)

## 2018-02-18 LAB — BASIC METABOLIC PANEL
BUN: 16 mg/dL (ref 6–23)
CHLORIDE: 107 meq/L (ref 96–112)
CO2: 25 mEq/L (ref 19–32)
Calcium: 9.8 mg/dL (ref 8.4–10.5)
Creatinine, Ser: 0.95 mg/dL (ref 0.40–1.20)
GFR: 57.37 mL/min — AB (ref >60.00)
Glucose, Bld: 119 mg/dL — ABNORMAL HIGH (ref 70–99)
POTASSIUM: 4.2 meq/L (ref 3.5–5.1)
SODIUM: 141 meq/L (ref 135–145)

## 2018-02-18 LAB — TSH: TSH: 4.82 u[IU]/mL — AB (ref 0.35–4.50)

## 2018-02-18 LAB — LIPID PANEL
CHOLESTEROL: 143 mg/dL (ref 0–200)
HDL: 43.9 mg/dL (ref >39.00)
LDL CALC: 80 mg/dL (ref 0–99)
NonHDL: 99.44
TRIGLYCERIDES: 97 mg/dL (ref 0.0–149.0)
Total CHOL/HDL Ratio: 3
VLDL: 19.4 mg/dL (ref 0.0–40.0)

## 2018-02-18 LAB — HEPATIC FUNCTION PANEL
ALBUMIN: 4.4 g/dL (ref 3.5–5.2)
ALT: 21 U/L (ref 0–35)
AST: 24 U/L (ref 0–37)
Alkaline Phosphatase: 75 U/L (ref 39–117)
Bilirubin, Direct: 0.1 mg/dL (ref 0.0–0.3)
TOTAL PROTEIN: 6.8 g/dL (ref 6.0–8.3)
Total Bilirubin: 0.7 mg/dL (ref 0.2–1.2)

## 2018-02-18 LAB — HEMOGLOBIN A1C: HEMOGLOBIN A1C: 5.9 % (ref 4.6–6.5)

## 2018-02-19 ENCOUNTER — Encounter: Payer: Self-pay | Admitting: Internal Medicine

## 2018-02-20 ENCOUNTER — Ambulatory Visit: Payer: Medicare Other

## 2018-02-21 ENCOUNTER — Encounter: Payer: Self-pay | Admitting: Internal Medicine

## 2018-02-21 ENCOUNTER — Ambulatory Visit (INDEPENDENT_AMBULATORY_CARE_PROVIDER_SITE_OTHER): Payer: Medicare Other

## 2018-02-21 ENCOUNTER — Ambulatory Visit: Payer: Medicare Other | Admitting: Internal Medicine

## 2018-02-21 VITALS — BP 114/70 | HR 76 | Temp 97.9°F | Resp 16 | Wt 195.4 lb

## 2018-02-21 VITALS — BP 114/70 | HR 76 | Temp 97.9°F | Resp 16 | Ht 63.0 in | Wt 195.4 lb

## 2018-02-21 DIAGNOSIS — Z0000 Encounter for general adult medical examination without abnormal findings: Secondary | ICD-10-CM

## 2018-02-21 DIAGNOSIS — E2839 Other primary ovarian failure: Secondary | ICD-10-CM | POA: Diagnosis not present

## 2018-02-21 DIAGNOSIS — R7989 Other specified abnormal findings of blood chemistry: Secondary | ICD-10-CM

## 2018-02-21 DIAGNOSIS — E7800 Pure hypercholesterolemia, unspecified: Secondary | ICD-10-CM

## 2018-02-21 DIAGNOSIS — I700 Atherosclerosis of aorta: Secondary | ICD-10-CM | POA: Diagnosis not present

## 2018-02-21 DIAGNOSIS — I10 Essential (primary) hypertension: Secondary | ICD-10-CM

## 2018-02-21 DIAGNOSIS — R739 Hyperglycemia, unspecified: Secondary | ICD-10-CM

## 2018-02-21 DIAGNOSIS — K2270 Barrett's esophagus without dysplasia: Secondary | ICD-10-CM | POA: Diagnosis not present

## 2018-02-21 DIAGNOSIS — K219 Gastro-esophageal reflux disease without esophagitis: Secondary | ICD-10-CM

## 2018-02-21 DIAGNOSIS — Z8619 Personal history of other infectious and parasitic diseases: Secondary | ICD-10-CM

## 2018-02-21 NOTE — Progress Notes (Signed)
Subjective:   Kaitlin Dean is a 73 y.o. female who presents for Medicare Annual (Subsequent) preventive examination.  Review of Systems:  No ROS.  Medicare Wellness Visit. Additional risk factors are reflected in the social history.  Cardiac Risk Factors include: advanced age (>25men, >31 women);hypertension     Objective:     Vitals: BP 114/70 (BP Location: Left Arm, Patient Position: Sitting, Cuff Size: Normal)   Pulse 76   Temp 97.9 F (36.6 C) (Oral)   Resp 16   Ht 5\' 3"  (1.6 m)   Wt 195 lb 6.4 oz (88.6 kg)   LMP 01/29/1993   SpO2 96%   BMI 34.61 kg/m   Body mass index is 34.61 kg/m.  Advanced Directives 02/21/2018 02/19/2017 02/06/2017 12/05/2016 06/11/2016 02/20/2016 02/14/2015  Does Patient Have a Medical Advance Directive? Yes Yes Yes Yes Yes Yes Yes  Type of Paramedic of [place];Living will [place];Living will [place];Living will [place];Living will [place];Living will [place];Living will [place];Living will  Does patient want to make changes to medical advance directive? No - Patient declined - - - - No - Patient declined No - Patient declined  Copy of [place] in Chart? No - copy requested No - copy requested No - copy requested No - copy requested No - copy requested No - copy requested No - copy requested  Pre-existing out of facility DNR order (yellow form or pink MOST form) - - - - - - -    Tobacco Social History   Tobacco Use  Smoking Status Never Smoker  Smokeless Tobacco Never Used     Counseling given: Not Answered   Clinical Intake:  Pre-visit preparation completed: Yes  Pain : No/denies pain     Diabetes: No  How often do you need to have someone help you when you read instructions, pamphlets, or other written materials from your doctor or pharmacy?: 1 - Never  Interpreter  Needed?: No     Past Medical History:  Diagnosis Date  . 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 Barrett's esophagus   . Chicken pox   . Complication of anesthesia    woke up 15 years ago and felt like could not breathe - no complications   . Diverticulosis of colon without hemorrhage 02/28/2014  . GERD (gastroesophageal reflux disease)   . History of fractured kneecap   . History of fractured pelvis   . History of hiatal hernia   . Hypercholesterolemia   . Hypertension   . Osteoarthritis   . Osteoporosis   . Pneumonia    in past  . 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 Skin cancer    Past Surgical History:  Procedure Laterality Date  . BREAST EXCISIONAL BIOPSY Right 2000   benign  . BREAST SURGERY  2000   Biopsy  . CATARACT EXTRACTION W/PHACO Right 12/05/2016   Procedure: CATARACT EXTRACTION PHACO AND INTRAOCULAR LENS PLACEMENT ([place]) RIGHT SYMFONY LENS;  Surgeon: Leandrew Koyanagi, MD;  Location: [place];  Service: Ophthalmology;  Laterality: Right;  . CATARACT EXTRACTION W/PHACO Left 02/06/2017   Procedure: CATARACT EXTRACTION PHACO AND INTRAOCULAR LENS PLACEMENT ([place]) LEFT SYMFONY LENS;  Surgeon: Leandrew Koyanagi, MD;  Location: [place];  Service: Ophthalmology;  Laterality: Left;  . COLONOSCOPY    . ESOPHAGOGASTRODUODENOSCOPY    . ESOPHAGOGASTRODUODENOSCOPY (EGD) WITH PROPOFOL N/A  06/11/2016   Procedure: ESOPHAGOGASTRODUODENOSCOPY (EGD) WITH PROPOFOL;  Surgeon: Manya Silvas, MD;  [facility] END[facility]COPY;  Service: Endoscopy;  Laterality: N/A;  . JOINT REPLACEMENT Left    knee  . PTOSIS REPAIR Bilateral 06/01/2014   Procedure: PTOSIS REPAIR;  Surgeon: Karle Starch, MD;  Location: [place];  Service: Ophthalmology;  Laterality: Bilateral;  . SKIN CANCER EXCISION    . TOTAL KNEE ARTHROPLASTY Left 04/30/2012   Procedure: TOTAL KNEE ARTHROPLASTY;  Surgeon: Gearlean Alf, MD;  Location: [facility];  Service: Orthopedics;   Laterality: Left;  . TUBAL LIGATION     Family History  Problem Relation Age of Onset  . Hypertension Mother   . Congestive Heart Failure Mother   . Stroke Father   . Stroke Brother   . Heart failure Brother   . Sarcoidosis Sister        died of kidney failure  . Breast cancer Neg Hx   . Colon cancer Neg Hx    Social History   Socioeconomic History  . Marital status: Widowed    Spouse name: Not on file  . Number of children: 2  . Years of education: Not on file  . Highest education level: Not on file  Occupational History  . Not on file  Social Needs  . Financial resource strain: Not hard at all  . Food insecurity:    Worry: Never true    Inability: Never true  . Transportation needs:    Medical: No    Non-medical: No  Tobacco Use  . Smoking status: Never Smoker  . Smokeless tobacco: Never Used  Substance and Sexual Activity  . Alcohol use: Yes    Alcohol/week: 1.0 standard drinks    Types: 1 Glasses of wine per week    Comment: SOCIALLY  . Drug use: No  . Sexual activity: Never  Lifestyle  . Physical activity:    Days per week: 5 days    Minutes per session: 30 min  . Stress: Not at all  Relationships  . Social connections:    Talks on phone: Not on file    Gets together: Not on file    Attends religious service: Not on file    Active member of club or organization: Not on file    Attends meetings of clubs or organizations: Not on file    Relationship status: Not on file  Other Topics Concern  . Not on file  Social History Narrative  . Not on file    Outpatient Encounter Medications as of 02/21/2018  Medication Sig  . albuterol (PROVENTIL HFA;VENTOLIN HFA) 108 (90 Base) MCG/ACT inhaler Inhale 2 puffs into the lungs every 6 (six) hours as needed for wheezing or shortness of breath.  . Cholecalciferol (VITAMIN D3 PO) Take by mouth daily.  Marland Kitchen ezetimibe-simvastatin (VYTORIN) 10-20 MG tablet TAKE ONE TABLET BY MOUTH AT BEDTIME  . ezetimibe-simvastatin  (VYTORIN) 10-20 MG tablet TAKE ONE TABLET BY MOUTH AT BEDTIME  . MELATONIN PO Take 1 capsule by mouth.  . Multiple Vitamin (MULTIVITAMIN) capsule Take 1 capsule by mouth daily. AM  . omeprazole (PRILOSEC) 20 MG capsule Take 20 mg by mouth daily. AM  . telmisartan (MICARDIS) 40 MG tablet TAKE ONE TABLET EVERY MORNING  . vitamin B-12 (CYANOCOBALAMIN) 1000 MCG tablet Take 1,000 mcg by mouth daily.  . Vitamin E 400 units TABS Take by mouth daily.   No facility-administered encounter medications on file as of 02/21/2018.     Activities  of Daily Living In your present state of health, do you have any difficulty performing the following activities: 02/21/2018  Hearing? N  Vision? N  Difficulty concentrating or making decisions? N  Walking or climbing stairs? N  Dressing or bathing? N  Doing errands, shopping? N  Preparing Food and eating ? N  Using the Toilet? N  In the past six months, have you accidently leaked urine? N  Do you have problems with loss of bowel control? N  Managing your Medications? N  Managing your Finances? N  Housekeeping or managing your Housekeeping? N  Some recent data might be hidden    Patient Care Team: Einar Pheasant, MD as PCP - General (Internal Medicine)    Assessment:   This is a routine wellness examination for Tangie.  Health Screenings  Mammogram -03/13/17 Colonoscopy -11/23/13 Bone Density -03/01/15 Glaucoma -none reported Hearing -reports audiology testing done with [facility].  She does not wear hearing aids.  Notes hearing changes, however demonstrates normal hearing while having conversation. Hemoglobin A1C -02/18/18 (5.9) Cholesterol -02/18/18 (143) Dental-every 6 months Vision-every 12 months  Social  Alcohol intake -yes, 1 per week Smoking history- -never Smokers in home?none Illicit drug use? none Exercise elliptical, calisthenics 5 days weekly, 30 minutes Diet-regular Sexually Active -never  Safety  Patient feels safe at  home.  Patient does have smoke detectors at home  Patient does wear sunscreen or protective clothing when in direct sunlight  Patient does wear seat belt when driving or riding with others.   Activities of Daily Living Patient can do their own household chores. Denies needing assistance with: driving, feeding themselves, getting from bed to chair, getting to the toilet, bathing/showering, dressing, managing money, climbing flight of stairs, or preparing meals.   Depression Screen Patient denies losing interest in daily life, feeling hopeless, or crying easily over simple problems.   Fall Screen Patient denies being afraid of falling.  She has fallen once in the last year due to slipping down a hillside. Injured her shoulder and is followed by pcp. She continues to practice physical therapy exercises at home.   Memory Screen Patient denies problems with memory, misplacing items, and is able to balance checkbook/bank accounts.  Patient is alert, normal appearance, oriented to person/place/and time. Correctly identified the president of the [place], recall of 2/3 objects, and performing simple calculations.  Patient displays appropriate judgement and can read correct time from watch face.   Immunizations The following Immunizations are up to date: Influenza, shingles, pneumonia, and tetanus.   Other Providers Patient Care Team: Einar Pheasant, MD as PCP - General (Internal Medicine)  Exercise Activities and Dietary recommendations Current Exercise Habits: Home exercise routine, Type of exercise: calisthenics, Time (Minutes): 30, Frequency (Times/Week): 5, Weekly Exercise (Minutes/Week): 150, Intensity: Mild  Goals    . Weight (lb) < 190 lb (86.2 kg)     Lose 10lbs       Fall Risk Fall Risk  02/21/2018 02/19/2017 02/20/2016 08/11/2015 02/14/2015  Falls in the past year? 1 No No No No  Number falls in past yr: 0 - - - -   Depression Screen PHQ 2/9 Scores 02/21/2018 01/24/2018 02/19/2017  08/17/2016  PHQ - 2 Score 0 1 0 0  PHQ- 9 Score - 7 0 1     Cognitive Function MMSE - Mini Mental State Exam 02/19/2017 02/14/2015  Orientation to time 5 5  Orientation to Place 5 5  Registration 3 3  Attention/ Calculation 5 5  Recall  3 3  Language- name 2 objects 2 2  Language- repeat 1 1  Language- follow 3 step command 3 3  Language- read & follow direction 1 1  Write a sentence 1 1  Copy design 1 1  Total score 30 30     6CIT Screen 02/21/2018 02/20/2016  What Year? 0 points 0 points  What month? 0 points 0 points  What time? 0 points 0 points  Count back from 20 0 points 0 points  Months in reverse 0 points 0 points  Repeat phrase 0 points 0 points  Total Score 0 0    Immunization History  Administered Date(s) Administered  . Influenza Split 10/30/2011, 11/14/2012  . Influenza, High Dose Seasonal PF 11/01/2015, 11/09/2016, 11/18/2017  . Influenza,inj,Quad PF,6+ Mos 11/12/2013, 01/18/2015  . Influenza-Unspecified 11/23/2011  . Pneumococcal Conjugate-13 08/27/2013  . Pneumococcal Polysaccharide-23 08/21/2017  . Zoster 03/29/2010  . Zoster Recombinat (Shingrix) 10/01/2017    Screening Tests Health Maintenance  Topic Date Due  . MAMMOGRAM  03/13/2018  . TETANUS/TDAP  01/30/2020  . COLONOSCOPY  11/24/2023  . INFLUENZA VACCINE  Completed  . DEXA SCAN  Completed  . Hepatitis C Screening  Completed       Plan:    End of life planning; Advance aging; Advanced directives discussed. Copy of current HCPOA/Living Will requested.    I have personally reviewed and noted the following in the patient's chart:   . Medical and social history . Use of alcohol, tobacco or illicit drugs  . Current medications and supplements . Functional ability and status . Nutritional status . Physical activity . Advanced directives . List of other physicians . Hospitalizations, surgeries, and ER visits in previous 12 months . Vitals . Screenings to include cognitive, depression,  and falls . Referrals and appointments  In addition, I have reviewed and discussed with patient certain preventive protocols, quality metrics, and best practice recommendations. A written personalized care plan for preventive services as well as general preventive health recommendations were provided to patient.     Varney Biles, LPN  0/10/9321   Reviewed above information.  Agree with assessment and plan.   Dr Nicki Reaper

## 2018-02-21 NOTE — Patient Instructions (Addendum)
  Kaitlin Dean , Thank you for taking time to come for your Medicare Wellness Visit. I appreciate your ongoing commitment to your health goals. Please review the following plan we discussed and let me know if I can assist you in the future.   Follow up as needed.    Bring a copy of your [place] and/or Living Will to be scanned into chart.  Have a great day and stay dry!  These are the goals we discussed: Goals    . Weight (lb) < 190 lb (86.2 kg)     Lose 10lbs       This is a list of the screening recommended for you and due dates:  Health Maintenance  Topic Date Due  . Mammogram  03/13/2018  . Tetanus Vaccine  01/30/2020  . Colon Cancer Screening  11/24/2023  . Flu Shot  Completed  . DEXA scan (bone density measurement)  Completed  .  Hepatitis C: One time screening is recommended by Center for Disease Control  (CDC) for  adults born from [birth year] through 1967.   Completed

## 2018-02-21 NOTE — Progress Notes (Signed)
Patient ID: Kaitlin Dean, female   DOB: 25-Dec-1945, 73 y.o.   MRN: [id number]   Subjective:    Patient ID: Kaitlin Dean, female    DOB: 01-Oct-1945, 73 y.o.   MRN: [id number]  HPI  Patient here for a scheduled follow up.  Saw GI 11/07/17 for f/u GERD.  Stable.  Recommended continuing otc prilosec.  History of nondisplaced proximal humerus fracture.  Went to physical therapy.  Doing well.  Planning to start back at the gym.  Limited rom - with reaching posteriorly.  No chest pain.  No sob.  No acid reflux.  No abdominal pain.  Bowels moving.  No urine change.  Discussed the need for bone density.  She is agreeable.  Wants scheduled at [facility].     Past Medical History:  Diagnosis Date  . 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 Barrett's esophagus   . Chicken pox   . Complication of anesthesia    woke up 15 years ago and felt like could not breathe - no complications   . Diverticulosis of colon without hemorrhage 02/28/2014  . GERD (gastroesophageal reflux disease)   . History of fractured kneecap   . History of fractured pelvis   . History of hiatal hernia   . Hypercholesterolemia   . Hypertension   . Osteoarthritis   . Osteoporosis   . Pneumonia    in past  . 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 Skin cancer    Past Surgical History:  Procedure Laterality Date  . BREAST EXCISIONAL BIOPSY Right 2000   benign  . BREAST SURGERY  2000   Biopsy  . CATARACT EXTRACTION W/PHACO Right 12/05/2016   Procedure: CATARACT EXTRACTION PHACO AND INTRAOCULAR LENS PLACEMENT ([place]) RIGHT SYMFONY LENS;  Surgeon: Leandrew Koyanagi, MD;  Location: [place];  Service: Ophthalmology;  Laterality: Right;  . CATARACT EXTRACTION W/PHACO Left 02/06/2017   Procedure: CATARACT EXTRACTION PHACO AND INTRAOCULAR LENS PLACEMENT ([place]) LEFT SYMFONY LENS;  Surgeon: Leandrew Koyanagi, MD;  Location: [place];  Service: Ophthalmology;  Laterality: Left;  . COLONOSCOPY    .  ESOPHAGOGASTRODUODENOSCOPY    . ESOPHAGOGASTRODUODENOSCOPY (EGD) WITH PROPOFOL N/A 06/11/2016   Procedure: ESOPHAGOGASTRODUODENOSCOPY (EGD) WITH PROPOFOL;  Surgeon: Manya Silvas, MD;  [facility];  Service: Endoscopy;  Laterality: N/A;  . JOINT REPLACEMENT Left    knee  . PTOSIS REPAIR Bilateral 06/01/2014   Procedure: PTOSIS REPAIR;  Surgeon: Karle Starch, MD;  Location: [place];  Service: Ophthalmology;  Laterality: Bilateral;  . SKIN CANCER EXCISION    . TOTAL KNEE ARTHROPLASTY Left 04/30/2012   Procedure: TOTAL KNEE ARTHROPLASTY;  Surgeon: Gearlean Alf, MD;  [facility];  Service: Orthopedics;  Laterality: Left;  . TUBAL LIGATION     Family History  Problem Relation Age of Onset  . Hypertension Mother   . Congestive Heart Failure Mother   . Stroke Father   . Stroke Brother   . Heart failure Brother   . Sarcoidosis Sister        died of kidney failure  . Breast cancer Neg Hx   . Colon cancer Neg Hx    Social History   Socioeconomic History  . Marital status: Widowed    Spouse name: Not on file  . Number of children: 2  . Years of education: Not on file  . Highest education level: Not on file  Occupational History  . Not on file  Social  Needs  . Financial resource strain: Not hard at all  . Food insecurity:    Worry: Never true    Inability: Never true  . Transportation needs:    Medical: No    Non-medical: No  Tobacco Use  . Smoking status: Never Smoker  . Smokeless tobacco: Never Used  Substance and Sexual Activity  . Alcohol use: Yes    Alcohol/week: 1.0 standard drinks    Types: 1 Glasses of wine per week    Comment: SOCIALLY  . Drug use: No  . Sexual activity: Never  Lifestyle  . Physical activity:    Days per week: 5 days    Minutes per session: 30 min  . Stress: Not at all  Relationships  . Social connections:    Talks on phone: Not on file    Gets together: Not on file    Attends religious service: Not on file     Active member of club or organization: Not on file    Attends meetings of clubs or organizations: Not on file    Relationship status: Not on file  Other Topics Concern  . Not on file  Social History Narrative  . Not on file    Outpatient Encounter Medications as of 02/21/2018  Medication Sig  . albuterol (PROVENTIL HFA;VENTOLIN HFA) 108 (90 Base) MCG/ACT inhaler Inhale 2 puffs into the lungs every 6 (six) hours as needed for wheezing or shortness of breath.  . Cholecalciferol (VITAMIN D3 PO) Take by mouth daily.  Marland Kitchen ezetimibe-simvastatin (VYTORIN) 10-20 MG tablet TAKE ONE TABLET BY MOUTH AT BEDTIME  . ezetimibe-simvastatin (VYTORIN) 10-20 MG tablet TAKE ONE TABLET BY MOUTH AT BEDTIME  . MELATONIN PO Take 1 capsule by mouth.  . Multiple Vitamin (MULTIVITAMIN) capsule Take 1 capsule by mouth daily. AM  . omeprazole (PRILOSEC) 20 MG capsule Take 20 mg by mouth daily. AM  . telmisartan (MICARDIS) 40 MG tablet TAKE ONE TABLET EVERY MORNING  . vitamin B-12 (CYANOCOBALAMIN) 1000 MCG tablet Take 1,000 mcg by mouth daily.  . Vitamin E 400 units TABS Take by mouth daily.  . [DISCONTINUED] amoxicillin-clavulanate (AUGMENTIN) 875-125 MG tablet Take 1 tablet by mouth 2 (two) times daily.  . [DISCONTINUED] chlorpheniramine-HYDROcodone (TUSSIONEX PENNKINETIC ER) 10-8 MG/5ML SUER Take 5 mLs by mouth at bedtime as needed for cough.   No facility-administered encounter medications on file as of 02/21/2018.     Review of Systems  Constitutional: Negative for appetite change and unexpected weight change.  HENT: Negative for congestion and sinus pressure.   Respiratory: Negative for cough, chest tightness and shortness of breath.   Cardiovascular: Negative for chest pain, palpitations and leg swelling.  Gastrointestinal: Negative for abdominal pain, diarrhea, nausea and vomiting.  Genitourinary: Negative for difficulty urinating and dysuria.  Musculoskeletal: Negative for joint swelling and myalgias.        Some limited rom when reaching posteriorly.    Skin: Negative for color change and rash.  Neurological: Negative for dizziness, light-headedness and headaches.  Psychiatric/Behavioral: Negative for agitation and dysphoric mood.       Objective:    Physical Exam Constitutional:      General: She is not in acute distress.    Appearance: Normal appearance.  HENT:     Nose: Nose normal. No congestion.     Mouth/Throat:     Pharynx: No oropharyngeal exudate or posterior oropharyngeal erythema.  Neck:     Musculoskeletal: Neck supple. No muscular tenderness.     Thyroid: No thyromegaly.  Cardiovascular:     Rate and Rhythm: Normal rate and regular rhythm.  Pulmonary:     Effort: No respiratory distress.     Breath sounds: Normal breath sounds. No wheezing.  Abdominal:     General: Bowel sounds are normal.     Palpations: Abdomen is soft.     Tenderness: There is no abdominal tenderness.  Musculoskeletal:        General: No swelling or tenderness.  Lymphadenopathy:     Cervical: No cervical adenopathy.  Skin:    Findings: No erythema or rash.  Neurological:     Mental Status: She is alert.  Psychiatric:        Mood and Affect: Mood normal.        Behavior: Behavior normal.     BP 114/70 (BP Location: Left Arm, Patient Position: Sitting, Cuff Size: Normal)   Pulse 76   Temp 97.9 F (36.6 C) (Oral)   Resp 16   Wt 195 lb 6.4 oz (88.6 kg)   LMP 01/29/1993   SpO2 96%   BMI 34.61 kg/m  Wt Readings from Last 3 Encounters:  02/21/18 195 lb 6.4 oz (88.6 kg)  02/21/18 195 lb 6.4 oz (88.6 kg)  01/24/18 190 lb 9.6 oz (86.5 kg)     Lab Results  Component Value Date   WBC 5.0 02/18/2018   HGB 14.5 02/18/2018   HCT 43.2 02/18/2018   PLT 223.0 02/18/2018   GLUCOSE 119 (H) 02/18/2018   CHOL 143 02/18/2018   TRIG 97.0 02/18/2018   HDL 43.90 02/18/2018   LDLDIRECT 182.4 02/05/2012   LDLCALC 80 02/18/2018   ALT 21 02/18/2018   AST 24 02/18/2018   NA 141 02/18/2018   K  4.2 02/18/2018   CL 107 02/18/2018   CREATININE 0.95 02/18/2018   BUN 16 02/18/2018   CO2 25 02/18/2018   TSH 4.82 (H) 02/18/2018   INR 0.99 04/22/2012   HGBA1C 5.9 02/18/2018    Mm Screening Breast Tomo Bilateral  Result Date: 03/13/2017 CLINICAL DATA:  Screening. EXAM: DIGITAL SCREENING BILATERAL MAMMOGRAM WITH TOMO AND CAD COMPARISON:  Previous exam(s). ACR Breast Density Category a: The breast tissue is almost entirely fatty. FINDINGS: There are no findings suspicious for malignancy. Images were processed with CAD. IMPRESSION: No mammographic evidence of malignancy. A result letter of this screening mammogram will be mailed directly to the patient. RECOMMENDATION: Screening mammogram in one year. (Code:SM-B-01Y) BI-RADS CATEGORY  1: Negative. Electronically Signed   By: Curlene Dolphin M.D.   On: 03/13/2017 12:49       Assessment & Plan:   Problem List Items Addressed This Visit    Aortic atherosclerosis ([place])    On vytorin.        Barrett's esophagus    Followed by GI.       GERD (gastroesophageal reflux disease)    Controlled on current regimen.  Planning for f/u EGD in 2021.        History of HPV infection    Due to f/u with gyn 03/2018.        Hypercholesterolemia    Low cholesterol diet and exercise.  On vytorin.  Follow lipid panel and liver function tests.        Hyperglycemia    Low carb diet and exercise.  Follow met b and a1c.       Hypertension    Blood pressure under good control.  Continue same medication regimen.  Follow pressures.  Follow metabolic panel.  Other Visit Diagnoses    Estrogen deficiency    -  Primary   schedule bone density.     Relevant Orders   DG Bone Density   Elevated TSH       Slightly increased on recent labs.  Recheck tsh.    Relevant Orders   TSH       Einar Pheasant, MD

## 2018-02-22 ENCOUNTER — Encounter: Payer: Self-pay | Admitting: Internal Medicine

## 2018-02-24 ENCOUNTER — Ambulatory Visit: Payer: Medicare Other | Admitting: Family Medicine

## 2018-02-24 ENCOUNTER — Encounter: Payer: Self-pay | Admitting: Family Medicine

## 2018-02-24 ENCOUNTER — Encounter: Payer: Self-pay | Admitting: Internal Medicine

## 2018-02-24 ENCOUNTER — Telehealth: Payer: Self-pay | Admitting: Lab

## 2018-02-24 ENCOUNTER — Telehealth: Payer: Self-pay

## 2018-02-24 ENCOUNTER — Ambulatory Visit (INDEPENDENT_AMBULATORY_CARE_PROVIDER_SITE_OTHER): Payer: Medicare Other

## 2018-02-24 VITALS — BP 110/66 | HR 80 | Temp 98.7°F | Resp 16 | Ht 63.0 in | Wt 192.0 lb

## 2018-02-24 DIAGNOSIS — J988 Other specified respiratory disorders: Secondary | ICD-10-CM

## 2018-02-24 DIAGNOSIS — R05 Cough: Secondary | ICD-10-CM

## 2018-02-24 DIAGNOSIS — R058 Other specified cough: Secondary | ICD-10-CM

## 2018-02-24 MED ORDER — DOXYCYCLINE HYCLATE 100 MG PO TABS: 100.0000 mg | ORAL_TABLET | Freq: Two times a day (BID) | ORAL | 0 refills | Status: DC

## 2018-02-24 MED ORDER — HYDROCOD POLST-CPM POLST ER 10-8 MG/5ML PO SUER: 5.0000 mL | Freq: Two times a day (BID) | ORAL | 0 refills | Status: DC | PRN

## 2018-02-24 MED ORDER — METHYLPREDNISOLONE 4 MG PO TBPK: ORAL_TABLET | ORAL | 0 refills | Status: DC

## 2018-02-24 MED ORDER — ALBUTEROL SULFATE HFA 108 (90 BASE) MCG/ACT IN AERS: 2.0000 | INHALATION_SPRAY | Freq: Four times a day (QID) | RESPIRATORY_TRACT | 2 refills | Status: DC | PRN

## 2018-02-24 NOTE — Assessment & Plan Note (Signed)
On vytorin.  

## 2018-02-24 NOTE — Assessment & Plan Note (Signed)
Blood pressure under good control.  Continue same medication regimen.  Follow pressures.  Follow metabolic panel.   

## 2018-02-24 NOTE — Progress Notes (Signed)
Subjective:    Patient ID: Kaitlin Dean, female    DOB: 09-29-1945, 73 y.o.   MRN: [id number]  HPI  Patient presents to clinic complaining of cough, chest congestion for the past 4 to 5 days.  States cough feels deep and phlegm is brown and green in color.  Patient states she has run out of her albuterol inhaler, so has not been using this.  Has been taking over-the-counter Mucinex with minimal effect in helping to reduce cough symptoms.  Denies fever or chills.  Denies chest pain.  Denies nausea/vomiting or diarrhea.  Patient Active Problem List   Diagnosis Date Noted  . Aortic atherosclerosis ([place]) 08/28/2016  . Abdominal pain 08/17/2016  . Throat irritation 02/20/2015  . Acute bronchitis 12/31/2014  . Hyperglycemia 09/01/2014  . Health care maintenance 08/07/2014  . BMI 35.0-35.9,adult 03/06/2014  . Diverticulosis of colon without hemorrhage 02/28/2014  . Breast pain, right 11/12/2013  . Breast thickening 11/12/2013  . Irregular heart rhythm 02/22/2013  . OA (osteoarthritis) of knee 04/30/2012  . Skin cancer 02/10/2012  . Hypertension 02/04/2012  . Hypercholesterolemia 02/04/2012  . GERD (gastroesophageal reflux disease) 02/04/2012  . History of HPV infection 02/04/2012  . Barrett's esophagus 02/04/2012   Social History   Tobacco Use  . Smoking status: Never Smoker  . Smokeless tobacco: Never Used  Substance Use Topics  . Alcohol use: Yes    Alcohol/week: 1.0 standard drinks    Types: 1 Glasses of wine per week    Comment: SOCIALLY   Review of Systems  Constitutional: Negative for chills, fatigue and fever.  HENT: Negative for congestion, ear pain, sinus pain and sore throat.   Eyes: Negative.   Respiratory: +cough, chest congestion, shortness of breath and wheezing.   Cardiovascular: Negative for chest pain, palpitations and leg swelling.  Gastrointestinal: Negative for abdominal pain, diarrhea, nausea and vomiting.  Genitourinary: Negative for dysuria,  frequency and urgency.  Musculoskeletal: Negative for arthralgias and myalgias.  Skin: Negative for color change, pallor and rash.  Neurological: Negative for syncope, light-headedness and headaches.  Psychiatric/Behavioral: The patient is not nervous/anxious.       Objective:   Physical Exam Vitals signs and nursing note reviewed.  Constitutional:      General: She is not in acute distress.    Appearance: She is not toxic-appearing.  HENT:     Head: Normocephalic and atraumatic.     Right Ear: Tympanic membrane and ear canal normal.     Left Ear: Tympanic membrane and ear canal normal.     Nose: Nose normal.     Mouth/Throat:     Mouth: Mucous membranes are moist.  Eyes:     General: No scleral icterus.    Extraocular Movements: Extraocular movements intact.     Conjunctiva/sclera: Conjunctivae normal.  Cardiovascular:     Rate and Rhythm: Normal rate and regular rhythm.  Pulmonary:     Effort: Pulmonary effort is normal. No respiratory distress.     Breath sounds: Wheezing and rhonchi present. No rales.     Comments: Faint expiratory wheeze and scattered rhonchi. Musculoskeletal:     Right lower leg: No edema.     Left lower leg: No edema.  Skin:    General: Skin is warm and dry.     Coloration: Skin is not pale.  Neurological:     Mental Status: She is alert and oriented to person, place, and time.  Psychiatric:        Mood and  Affect: Mood normal.        Behavior: Behavior normal.    Vitals:   02/24/18 1012  BP: 110/66  Pulse: 80  Resp: 16  Temp: 98.7 F (37.1 C)  SpO2: 95%      Assessment & Plan:   Productive cough/respiratory infection - due to discolored phlegm with cough we will get chest x-ray in clinic and cover patient with doxycycline to treat respiratory infection.  She will also take steroid taper and use albuterol inhaler.  Tussionex cough syrup sent to pharmacy to help reduce cough, patient advised that this medication can cause drowsiness so she  is aware to not take this prior to driving.  We will make patient aware of chest x-ray results when available.  Advised patient that if she is not improving in the next 1 to 2 weeks, she needs to return to clinic for reevaluation.

## 2018-02-24 NOTE — Telephone Encounter (Signed)
Patient has been notified

## 2018-02-24 NOTE — Telephone Encounter (Signed)
Called Pt to tell her Rx for Prednisone was sent to pharmacy. Okay for [facility] to speak to Pt.

## 2018-02-24 NOTE — Telephone Encounter (Signed)
Pt called P[facility] Patient saw Lauren this morning. I looked in her chart and did not see anything about having prednisone taper. I only saw the Tussionex, Doxycyline and Albuterol. Was this something that was supposed to be sent in  Pt stated she would like the Rx for prednisone to be sent to [place], [place] - [place]   [phone] (Phone)  [phone] (Fax)

## 2018-02-24 NOTE — Telephone Encounter (Signed)
Pt seen Lauren today

## 2018-02-24 NOTE — Assessment & Plan Note (Signed)
Controlled on current regimen.  Planning for f/u EGD in 2021.

## 2018-02-24 NOTE — Assessment & Plan Note (Signed)
Followed by GI

## 2018-02-24 NOTE — Assessment & Plan Note (Signed)
Low cholesterol diet and exercise.  On vytorin.  Follow lipid panel and liver function tests.

## 2018-02-24 NOTE — Telephone Encounter (Signed)
Patient saw Lauren this morning. I looked in her chart and did not see anything about having prednisone taper. I only saw the Tussionex, Doxycyline and Albuterol. Was this something that was supposed to be sent in?  Copied from [place] [id number]. Topic: General - Other >> Feb 24, 2018 11:33 AM Yvette Rack wrote: Reason for CRM: Pt stated she was told she would get a Rx for prednisone however it is not listed on her paperwork. Pt stated she would like the Rx for prednisone to be sent to [place], [place] - [place] [phone] (Phone)  [phone] (Fax)

## 2018-02-24 NOTE — Assessment & Plan Note (Signed)
Low carb diet and exercise.  Follow met b and a1c.  

## 2018-02-24 NOTE — Assessment & Plan Note (Signed)
Due to f/u with gyn 03/2018.

## 2018-03-03 ENCOUNTER — Encounter: Payer: Self-pay | Admitting: Internal Medicine

## 2018-03-07 ENCOUNTER — Telehealth: Payer: Self-pay

## 2018-03-07 NOTE — Telephone Encounter (Signed)
Copied from [place] [phone]. Topic: General - Call Back - No Documentation >> Mar 07, 2018  4:47 PM Sheran Luz wrote: Reason for CRM: Patient returning call to office- No documentation.

## 2018-03-10 NOTE — Telephone Encounter (Signed)
Called patient to see if she needed anything from me. I did not call patient and she stated she did not need anything

## 2018-03-21 ENCOUNTER — Other Ambulatory Visit: Payer: Self-pay | Admitting: Internal Medicine

## 2018-03-21 DIAGNOSIS — Z1231 Encounter for screening mammogram for malignant neoplasm of breast: Secondary | ICD-10-CM

## 2018-04-09 ENCOUNTER — Other Ambulatory Visit: Payer: Self-pay

## 2018-04-09 ENCOUNTER — Encounter: Payer: Self-pay | Admitting: Internal Medicine

## 2018-04-09 ENCOUNTER — Other Ambulatory Visit (INDEPENDENT_AMBULATORY_CARE_PROVIDER_SITE_OTHER): Payer: Medicare Other

## 2018-04-09 DIAGNOSIS — R7989 Other specified abnormal findings of blood chemistry: Secondary | ICD-10-CM

## 2018-04-09 LAB — TSH: TSH: 2.67 u[IU]/mL (ref 0.35–4.50)

## 2018-05-01 ENCOUNTER — Other Ambulatory Visit: Payer: Medicare Other

## 2018-06-02 ENCOUNTER — Other Ambulatory Visit: Payer: Self-pay | Admitting: Internal Medicine

## 2018-06-09 ENCOUNTER — Telehealth: Payer: Self-pay | Admitting: Internal Medicine

## 2018-06-09 DIAGNOSIS — J989 Respiratory disorder, unspecified: Secondary | ICD-10-CM

## 2018-06-09 NOTE — Telephone Encounter (Signed)
Copied from [place] [phone]. Topic: Quick Communication - See Telephone Encounter >> Jun 09, 2018  3:02 PM Vernona Rieger wrote: CRM for notification. See Telephone encounter for: 06/09/18.  Patient states she was very sick in December and January. She had OV's in December and January. She would like to have an antibody test done. Please Advise

## 2018-06-10 ENCOUNTER — Other Ambulatory Visit: Payer: Medicare Other

## 2018-06-12 NOTE — Telephone Encounter (Signed)
Please call and let her know about the ab testing as we discussed.  See me before calling.

## 2018-06-16 NOTE — Telephone Encounter (Signed)
Explained test to pt. She would still like to have it done.

## 2018-06-16 NOTE — Telephone Encounter (Signed)
Order placed for lab.  She will need [facility] to have lab.

## 2018-06-16 NOTE — Telephone Encounter (Signed)
LMTCB

## 2018-06-17 NOTE — Telephone Encounter (Signed)
Left message letting pt know that order was placed

## 2018-07-02 ENCOUNTER — Other Ambulatory Visit: Payer: Self-pay | Admitting: Internal Medicine

## 2018-07-20 IMAGING — MG MM DIGITAL SCREENING BILAT W/ TOMO W/ CAD
8 of 13 series · 8 of 29 positions shown · non-contrast
Comparison: Previous exam(s).

ACR Breast Density Category a: The breast tissue is almost entirely
fatty.

CLINICAL DATA: Screening.

EXAM:
DIGITAL SCREENING BILATERAL MAMMOGRAM WITH TOMO AND CAD

[R MLO (1 of 2)]
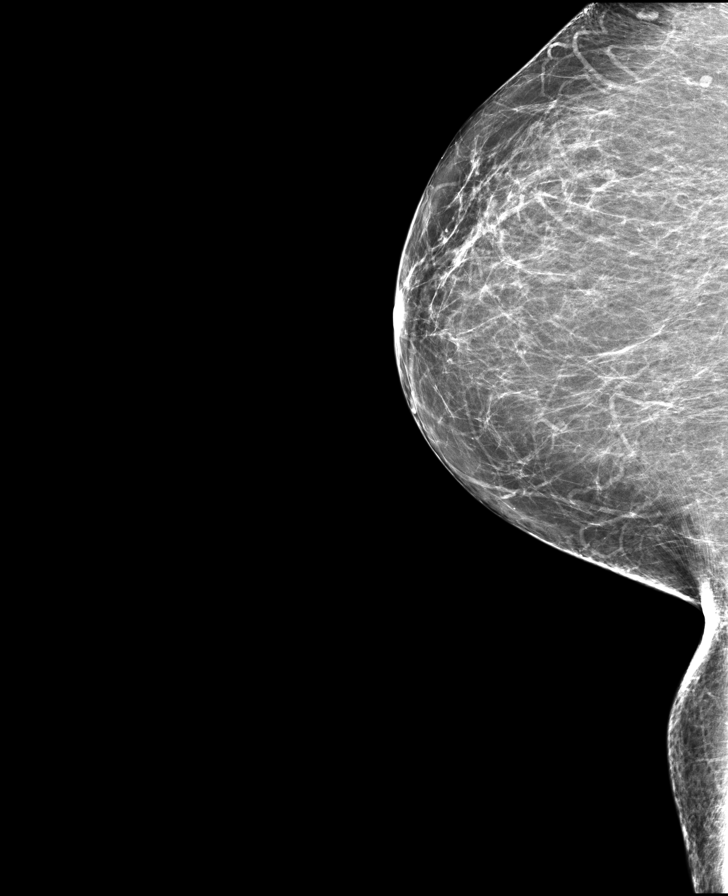

[L CC synth-2D]
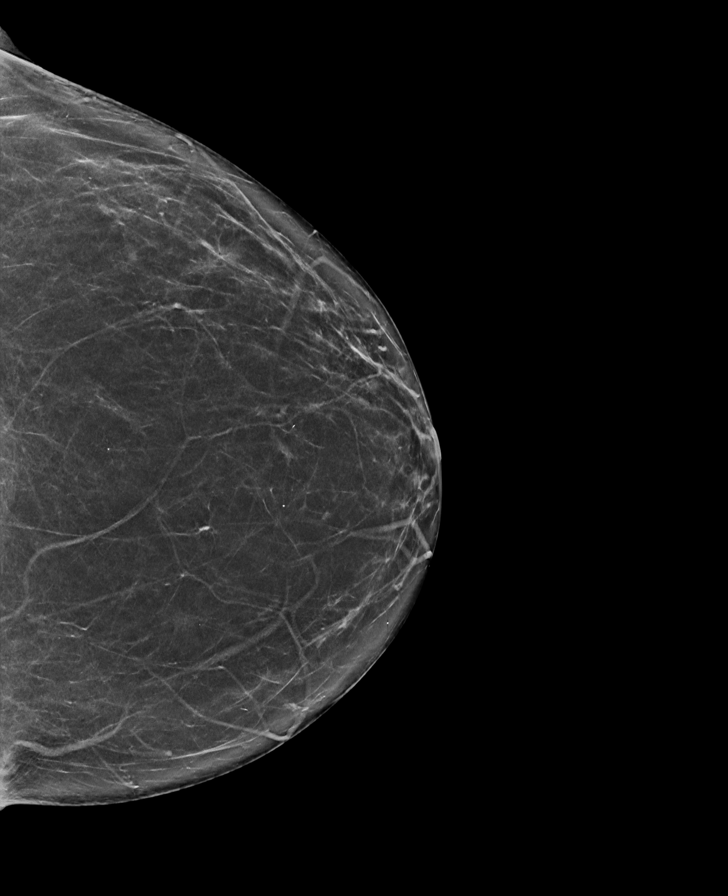

[L CC]
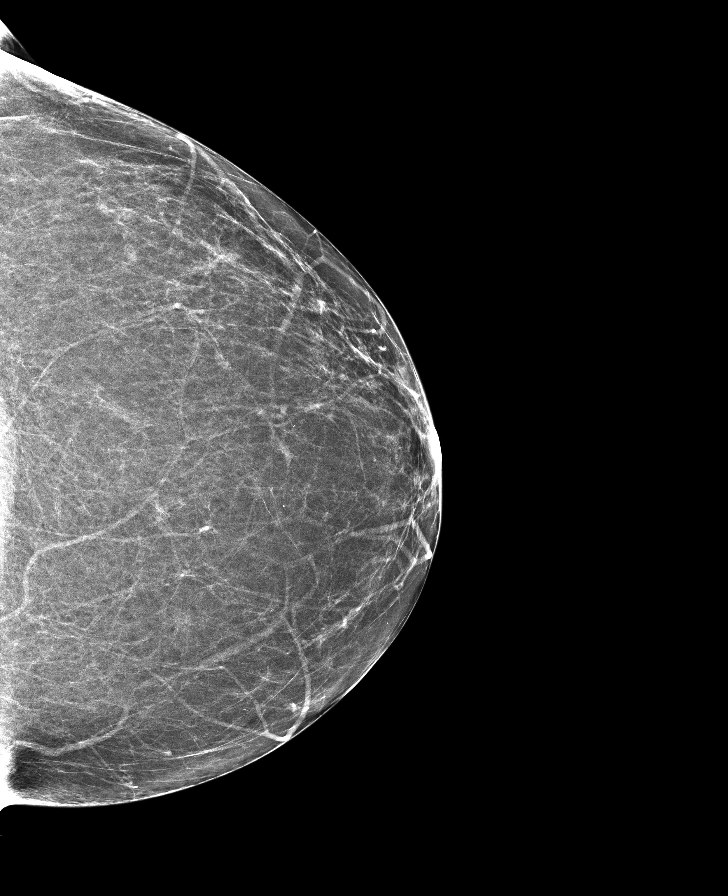

[R MLO (2 of 2)]
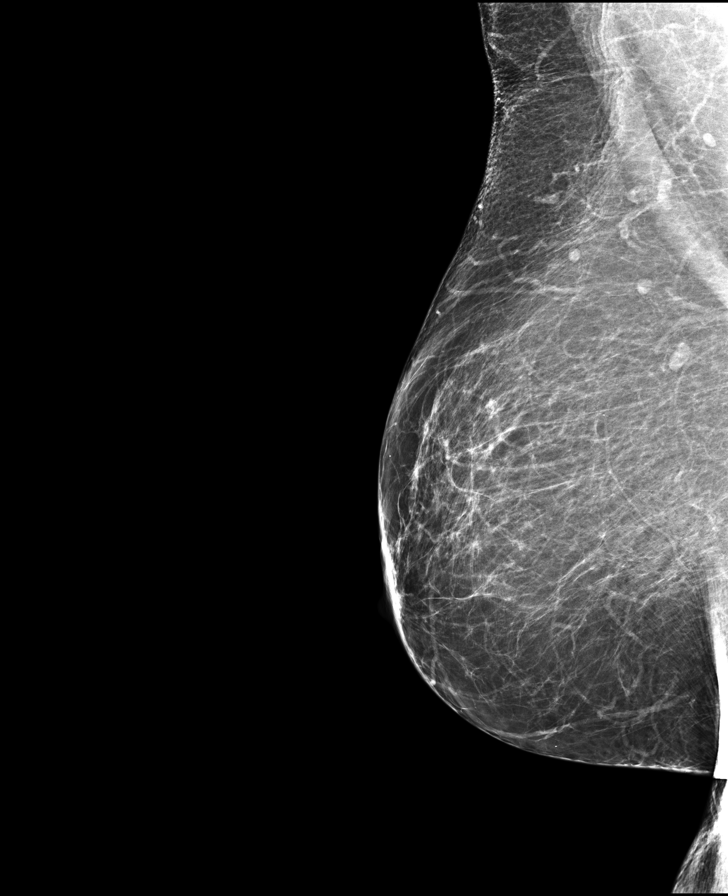

[R MLO synth-2D]
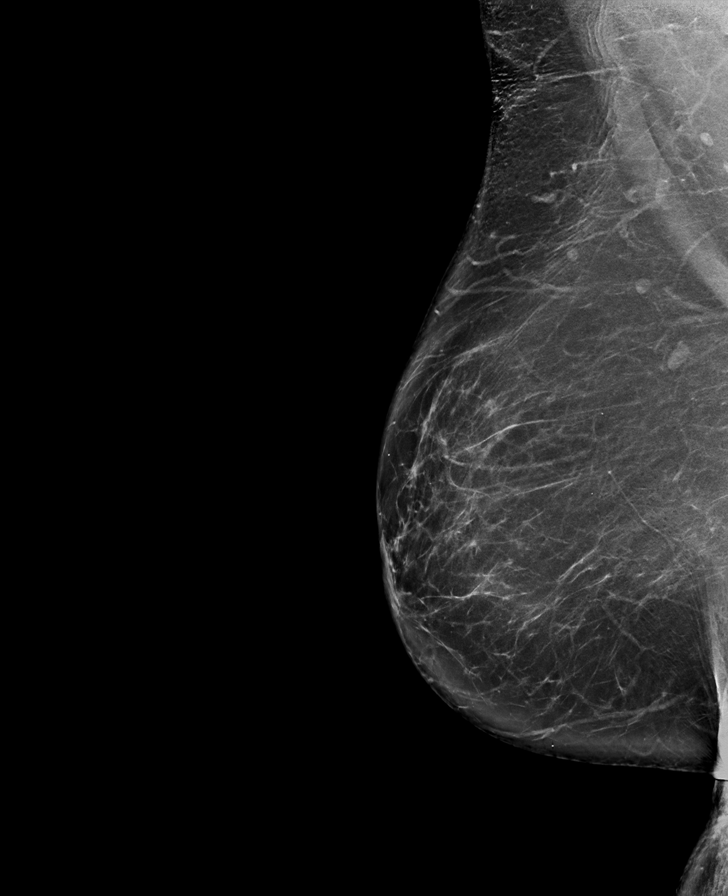

[R CC]
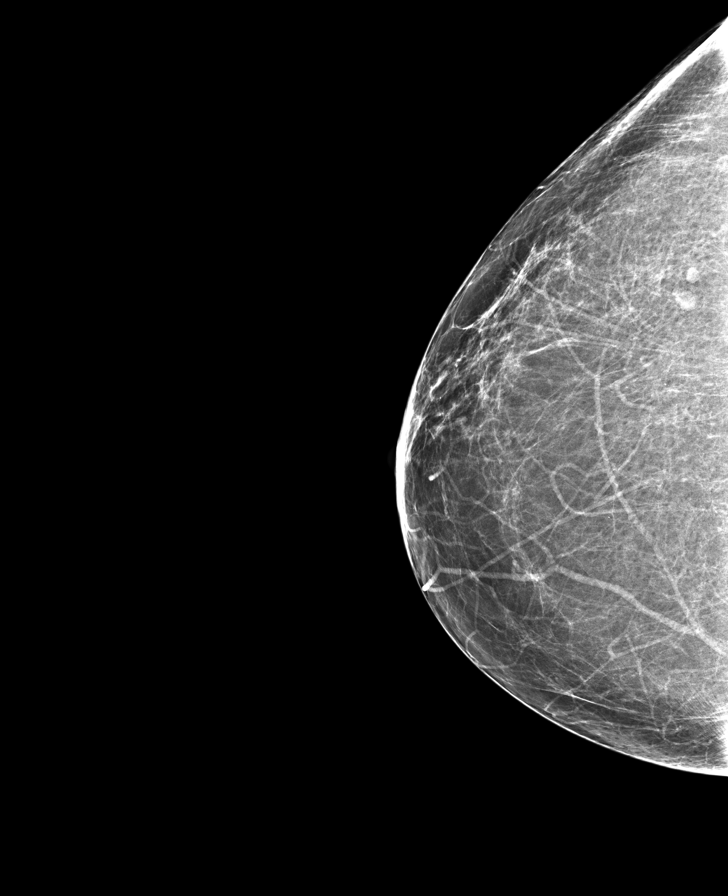

[R CC synth-2D]
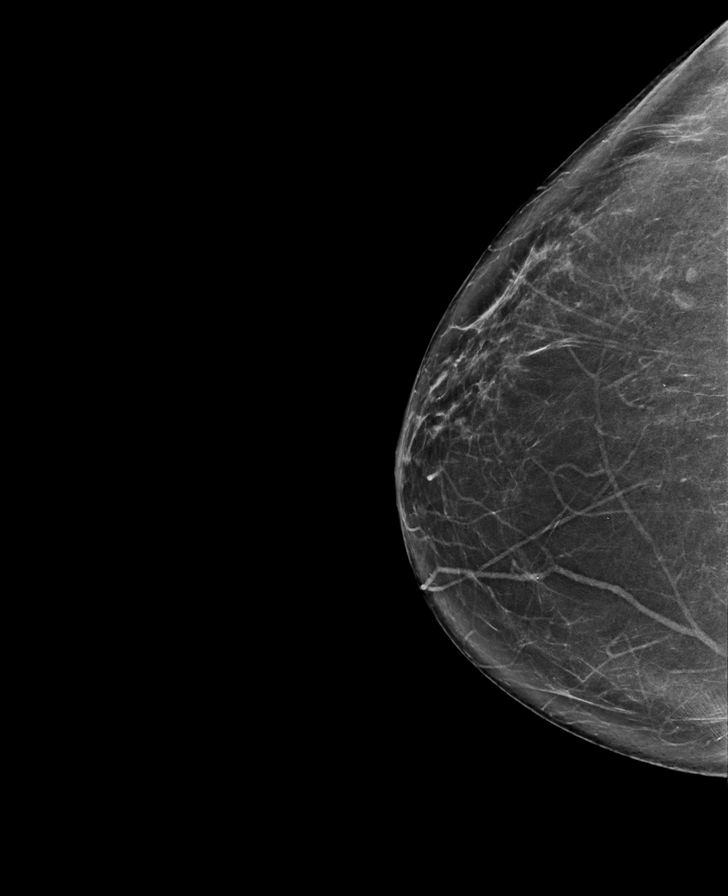

[L MLO]
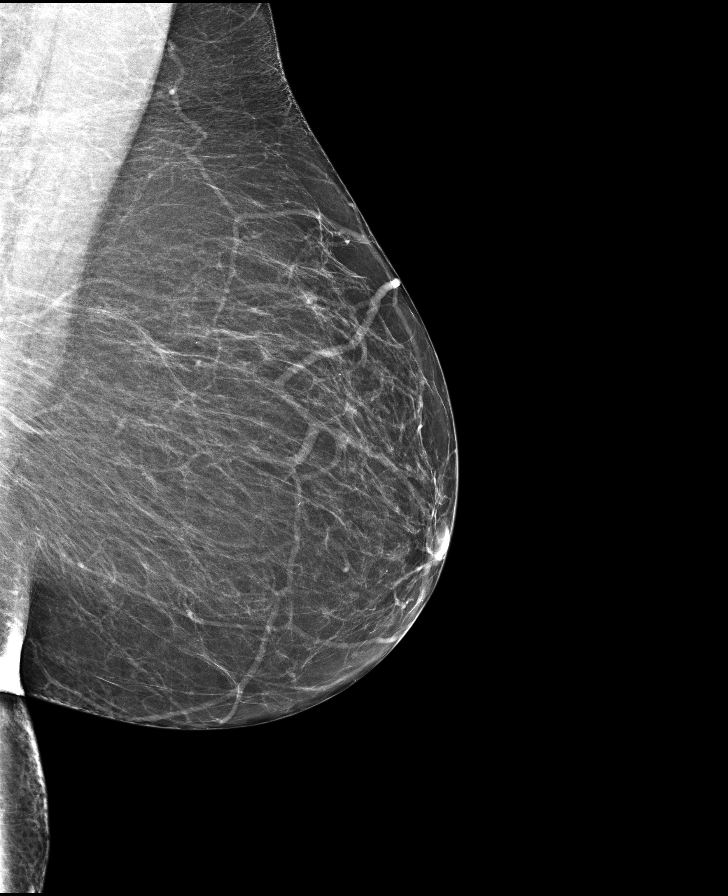

[8 of 29 positions shown; findings below may reference images not displayed]

FINDINGS: There are no findings suspicious for malignancy. Images were
processed with CAD.
IMPRESSION: No mammographic evidence of malignancy. A result letter of this
screening mammogram will be mailed directly to the patient.

RECOMMENDATION:
Screening mammogram in one year. (Code:8Y-[id number])

BI-RADS CATEGORY  1: Negative.

## 2018-07-31 ENCOUNTER — Ambulatory Visit: Payer: Medicare Other

## 2018-08-07 ENCOUNTER — Ambulatory Visit
Admission: RE | Admit: 2018-08-07 | Discharge: 2018-08-07 | Disposition: A | Discharge status: Home / Self Care | Payer: Medicare Other | Source: Ambulatory Visit | Attending: Internal Medicine | Admitting: Internal Medicine

## 2018-08-07 ENCOUNTER — Other Ambulatory Visit: Payer: Self-pay

## 2018-08-07 DIAGNOSIS — Z1231 Encounter for screening mammogram for malignant neoplasm of breast: Secondary | ICD-10-CM | POA: Diagnosis present

## 2018-08-07 DIAGNOSIS — E2839 Other primary ovarian failure: Secondary | ICD-10-CM

## 2018-08-26 ENCOUNTER — Other Ambulatory Visit: Payer: Self-pay

## 2018-08-28 ENCOUNTER — Encounter: Payer: Self-pay | Admitting: Internal Medicine

## 2018-08-28 ENCOUNTER — Ambulatory Visit (INDEPENDENT_AMBULATORY_CARE_PROVIDER_SITE_OTHER): Payer: Medicare Other | Admitting: Internal Medicine

## 2018-08-28 ENCOUNTER — Other Ambulatory Visit: Payer: Self-pay

## 2018-08-28 VITALS — BP 130/78 | HR 76 | Temp 98.2°F | Resp 16 | Ht 63.0 in | Wt 195.0 lb

## 2018-08-28 DIAGNOSIS — I700 Atherosclerosis of aorta: Secondary | ICD-10-CM

## 2018-08-28 DIAGNOSIS — E7800 Pure hypercholesterolemia, unspecified: Secondary | ICD-10-CM

## 2018-08-28 DIAGNOSIS — Z0000 Encounter for general adult medical examination without abnormal findings: Secondary | ICD-10-CM | POA: Diagnosis not present

## 2018-08-28 DIAGNOSIS — K219 Gastro-esophageal reflux disease without esophagitis: Secondary | ICD-10-CM

## 2018-08-28 DIAGNOSIS — I10 Essential (primary) hypertension: Secondary | ICD-10-CM | POA: Diagnosis not present

## 2018-08-28 DIAGNOSIS — R739 Hyperglycemia, unspecified: Secondary | ICD-10-CM

## 2018-08-28 DIAGNOSIS — Z8619 Personal history of other infectious and parasitic diseases: Secondary | ICD-10-CM

## 2018-08-28 DIAGNOSIS — K2270 Barrett's esophagus without dysplasia: Secondary | ICD-10-CM

## 2018-08-28 LAB — BASIC METABOLIC PANEL
BUN: 15 mg/dL (ref 6–23)
CO2: 25 mEq/L (ref 19–32)
Calcium: 9.6 mg/dL (ref 8.4–10.5)
Chloride: 106 mEq/L (ref 96–112)
Creatinine, Ser: 0.81 mg/dL (ref 0.40–1.20)
GFR: 68.86 mL/min (ref >60.00)
Glucose, Bld: 109 mg/dL — ABNORMAL HIGH (ref 70–99)
Potassium: 4.2 mEq/L (ref 3.5–5.1)
Sodium: 140 mEq/L (ref 135–145)

## 2018-08-28 LAB — LIPID PANEL
Cholesterol: 149 mg/dL (ref 0–200)
HDL: 40.6 mg/dL (ref >39.00)
LDL Cholesterol: 77 mg/dL (ref 0–99)
NonHDL: 107.96
Total CHOL/HDL Ratio: 4
Triglycerides: 157 mg/dL — ABNORMAL HIGH (ref 0.0–149.0)
VLDL: 31.4 mg/dL (ref 0.0–40.0)

## 2018-08-28 LAB — HEPATIC FUNCTION PANEL
ALT: 24 U/L (ref 0–35)
AST: 22 U/L (ref 0–37)
Albumin: 4.6 g/dL (ref 3.5–5.2)
Alkaline Phosphatase: 86 U/L (ref 39–117)
Bilirubin, Direct: 0.2 mg/dL (ref 0.0–0.3)
Total Bilirubin: 1.1 mg/dL (ref 0.2–1.2)
Total Protein: 6.7 g/dL (ref 6.0–8.3)

## 2018-08-28 LAB — HEMOGLOBIN A1C: Hgb A1c MFr Bld: 6.1 % (ref 4.6–6.5)

## 2018-08-28 LAB — TSH: TSH: 2.57 u[IU]/mL (ref 0.35–4.50)

## 2018-08-28 NOTE — Assessment & Plan Note (Addendum)
Physical today 08/28/18.  PAP through gyn 03/18/18 - ok, atrophy. No HPV checked.   Mammogram 08/07/18 - Birads I.  Colonoscopy 10/2013.

## 2018-08-28 NOTE — Progress Notes (Signed)
Patient ID: MARZELLA MIRACLE, female   DOB: [birth year], 73 y.o.   MRN: [id number]   Subjective:    Patient ID: ACELYNN DEJONGE, female    DOB: [birth year], 73 y.o.   MRN: [id number]  HPI  Patient here for her physical exam.  She reports she is doing well.  Feels good.  Trying to stay in due to covid restrictions.  No fever.  No cough or chest congestion.  No sob.  Trying to stay active.  No chest pain.  No acid reflux.  No abdominal pain.  Bowels moving.  Followed by GI for history of Barrett's.  Due f/u EGD 2021.  On vytorin.  Followed by gyn for abnormal pap. States just saw gyn and reports everything ok.      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)    Skin cancer    Past Surgical History:  Procedure Laterality Date   BREAST EXCISIONAL BIOPSY Right 2000   benign   BREAST SURGERY  2000   Biopsy   CATARACT EXTRACTION W/PHACO Right 12/05/2016   Procedure: CATARACT EXTRACTION PHACO AND INTRAOCULAR LENS PLACEMENT ([place]) RIGHT SYMFONY LENS;  Surgeon: Leandrew Koyanagi, MD;  Location: [facility];  Service: Ophthalmology;  Laterality: Right;   CATARACT EXTRACTION W/PHACO Left 02/06/2017   Procedure: CATARACT EXTRACTION PHACO AND INTRAOCULAR LENS PLACEMENT ([place]) LEFT SYMFONY LENS;  Surgeon: Leandrew Koyanagi, MD;  Location: [facility];  Service: Ophthalmology;  Laterality: Left;   COLONOSCOPY     ESOPHAGOGASTRODUODENOSCOPY      ESOPHAGOGASTRODUODENOSCOPY (EGD) WITH PROPOFOL N/A 06/11/2016   Procedure: ESOPHAGOGASTRODUODENOSCOPY (EGD) WITH PROPOFOL;  Surgeon: Manya Silvas, MD;  Location: [facility];  Service: Endoscopy;  Laterality: N/A;   JOINT REPLACEMENT Left    knee   PTOSIS REPAIR Bilateral 06/01/2014   Procedure: PTOSIS REPAIR;  Surgeon: Karle Starch, MD;  Location: [facility];  Service: Ophthalmology;  Laterality: Bilateral;   SKIN CANCER EXCISION     TOTAL KNEE ARTHROPLASTY Left 04/30/2012   Procedure: TOTAL KNEE ARTHROPLASTY;  Surgeon: Gearlean Alf, MD;  Location: [facility];  Service: Orthopedics;  Laterality: Left;   TUBAL LIGATION     Family History  Problem Relation Age of Onset   Hypertension Mother    Congestive Heart Failure Mother    Stroke Father    Stroke Brother    Heart failure Brother    Sarcoidosis Sister        died of kidney failure   Breast cancer Neg Hx    Colon cancer Neg Hx    Social History   Socioeconomic History   Marital status: Widowed    Spouse name: Not on file   Number of children: 2   Years of education: Not on file   Highest education level: Not on file  Occupational History   Not on file  Social  Needs   Financial resource strain: Not hard at all   Food insecurity    Worry: Never true    Inability: Never true   Transportation needs    Medical: No    Non-medical: No  Tobacco Use   Smoking status: Never Smoker   Smokeless tobacco: Never Used  Substance and Sexual Activity   Alcohol use: Yes    Alcohol/week: 1.0 standard drinks    Types: 1 Glasses of wine per week    Comment: SOCIALLY   Drug use: No   Sexual activity: Never  Lifestyle   Physical activity    Days per week: 5 days    Minutes per session: 30 min   Stress: Not at all  Relationships   Social connections    Talks on phone: Not on file    Gets together: Not on file    Attends religious service: Not on file    Active member of club or  organization: Not on file    Attends meetings of clubs or organizations: Not on file    Relationship status: Not on file  Other Topics Concern   Not on file  Social History Narrative   Not on file    Outpatient Encounter Medications as of 08/28/2018  Medication Sig   albuterol (PROVENTIL HFA;VENTOLIN HFA) 108 (90 Base) MCG/ACT inhaler Inhale 2 puffs into the lungs every 6 (six) hours as needed for wheezing or shortness of breath.   Cholecalciferol (VITAMIN D3 PO) Take by mouth daily.   ezetimibe-simvastatin (VYTORIN) 10-20 MG tablet TAKE ONE TABLET AT BEDTIME   MELATONIN PO Take 1 capsule by mouth.   Multiple Vitamin (MULTIVITAMIN) capsule Take 1 capsule by mouth daily. AM   omeprazole (PRILOSEC) 20 MG capsule Take 20 mg by mouth daily. AM   telmisartan (MICARDIS) 40 MG tablet TAKE ONE TABLET EVERY MORNING   vitamin B-12 (CYANOCOBALAMIN) 1000 MCG tablet Take 1,000 mcg by mouth daily.   Vitamin E 400 units TABS Take by mouth daily.   [DISCONTINUED] chlorpheniramine-HYDROcodone (TUSSIONEX PENNKINETIC ER) 10-8 MG/5ML SUER Take 5 mLs by mouth every 12 (twelve) hours as needed.   [DISCONTINUED] doxycycline (VIBRA-TABS) 100 MG tablet Take 1 tablet (100 mg total) by mouth 2 (two) times daily.   [DISCONTINUED] ezetimibe-simvastatin (VYTORIN) 10-20 MG tablet TAKE ONE TABLET BY MOUTH AT BEDTIME   [DISCONTINUED] methylPREDNISolone (MEDROL DOSEPAK) 4 MG TBPK tablet Take according to pack instructions   No facility-administered encounter medications on file as of 08/28/2018.     Review of Systems  Constitutional: Negative for appetite change and unexpected weight change.  HENT: Negative for congestion and sinus pressure.   Eyes: Negative for pain and visual disturbance.  Respiratory: Negative for cough, chest tightness and shortness of breath.   Cardiovascular: Negative for chest pain, palpitations and leg swelling.  Gastrointestinal: Negative for abdominal pain, diarrhea, nausea  and vomiting.  Genitourinary: Negative for difficulty urinating and dysuria.  Musculoskeletal: Negative for joint swelling and myalgias.  Skin: Negative for color change and rash.  Neurological: Negative for dizziness, light-headedness and headaches.  Hematological: Negative for adenopathy. Does not bruise/bleed easily.  Psychiatric/Behavioral: Negative for agitation and dysphoric mood.       Objective:    Physical Exam Constitutional:      General: She is not in acute distress.    Appearance: Normal appearance. She is well-developed.  HENT:     Right Ear: External ear normal.     Left Ear: External ear normal.  Eyes:  General: No scleral icterus.       Right eye: No discharge.        Left eye: No discharge.     Conjunctiva/sclera: Conjunctivae normal.  Neck:     Musculoskeletal: Neck supple. No muscular tenderness.     Thyroid: No thyromegaly.  Cardiovascular:     Rate and Rhythm: Normal rate and regular rhythm.  Pulmonary:     Effort: No tachypnea, accessory muscle usage or respiratory distress.     Breath sounds: Normal breath sounds. No decreased breath sounds or wheezing.  Chest:     Breasts:        Right: No inverted nipple, mass, nipple discharge or tenderness (no axillary adenopathy).        Left: No inverted nipple, mass, nipple discharge or tenderness (no axilarry adenopathy).  Abdominal:     General: Bowel sounds are normal.     Palpations: Abdomen is soft.     Tenderness: There is no abdominal tenderness.  Musculoskeletal:        General: No swelling or tenderness.  Lymphadenopathy:     Cervical: No cervical adenopathy.  Skin:    Findings: No erythema or rash.  Neurological:     Mental Status: She is alert and oriented to person, place, and time.  Psychiatric:        Mood and Affect: Mood normal.        Behavior: Behavior normal.     BP 130/78    Pulse 76    Temp 98.2 F (36.8 C) (Oral)    Resp 16    Ht _0  (1.6 m)    Wt 195 lb (88.5 kg)    LMP  01/29/1993    SpO2 98%    BMI 34.54 kg/m  Wt Readings from Last 3 Encounters:  08/28/18 195 lb (88.5 kg)  02/24/18 192 lb (87.1 kg)  02/21/18 195 lb 6.4 oz (88.6 kg)     Lab Results  Component Value Date   WBC 5.0 02/18/2018   HGB 14.5 02/18/2018   HCT 43.2 02/18/2018   PLT 223.0 02/18/2018   GLUCOSE 109 (H) 08/28/2018   CHOL 149 08/28/2018   TRIG 157.0 (H) 08/28/2018   HDL 40.60 08/28/2018   LDLDIRECT 182.4 02/05/2012   LDLCALC 77 08/28/2018   ALT 24 08/28/2018   AST 22 08/28/2018   NA 140 08/28/2018   K 4.2 08/28/2018   CL 106 08/28/2018   CREATININE 0.81 08/28/2018   BUN 15 08/28/2018   CO2 25 08/28/2018   TSH 2.57 08/28/2018   INR 0.99 04/22/2012   HGBA1C 6.1 08/28/2018    Dg Bone Density  Result Date: 08/07/2018 EXAM: DUAL X-RAY ABSORPTIOMETRY (DXA) FOR BONE MINERAL DENSITY IMPRESSION: Technologist: MTB Your patient Clarann Helvey completed a BMD test on 08/07/2018 using the [place] (analysis version: 14.10) manufactured by [employer]. The following summarizes the results of our evaluation. PATIENT BIOGRAPHICAL: Name: Richel, Millspaugh Patient ID:  [id number] Birth Date: [birth year] Height:     63.0 in. Gender:      Female Exam Date:  08/07/2018 Weight:     194.0 lbs. Indications: Osteoarthritis, Height Loss, Caucasian, History of Fracture (Adult), Postmenopausal Fractures: Treatments: Albuterol, Multi-Vitamin with calcium, Vitamin D ASSESSMENT: The BMD measured at Forearm Radius 33% is 0.606 g/cm2 with a T-score of -3.1. This patient is considered OSTEOPOROTIC according to [facility]) criteria. The quality of the scan is good. L-3 was excluded due to degenerative changes. Site Region  Measured Measured WHO Young Adult BMD Date       Age      Classification T-score AP Spine L1-L4 (L3) 08/07/2018 75.3 Osteopenia -1.5 0.997 g/cm2 AP Spine L1-L4 (L3) 03/01/2015 71.8 Osteopenia -1.3 1.028 g/cm2 DualFemur Total Right 08/07/2018 75.3 Osteopenia -1.9  0.771 g/cm2 DualFemur Total Right 03/01/2015 71.8 Osteopenia -1.2 0.850 g/cm2 DualFemur Total Mean 08/07/2018 75.3 Osteopenia -1.6 0.803 g/cm2 DualFemur Total Mean 03/01/2015 71.8 Osteopenia -1.4 0.837 g/cm2 Left Forearm Radius 33% 08/07/2018 75.3 Osteoporosis -3.1 0.606 g/cm2 World Health Organization [facility]) criteria for post-menopausal, Caucasian Women: Normal:       T-score at or above -1 SD Osteopenia:   T-score between -1 and -2.5 SD Osteoporosis: T-score at or below -2.5 SD RECOMMENDATIONS: 1. All patients should optimize calcium and vitamin D intake. 2. Consider FDA-approved medical therapies in postmenopausal women and men aged 52 years and older, based on the following: a. A hip or vertebral(clinical or morphometric) fracture b. T-score < -2.5 at the femoral neck or spine after appropriate evaluation to exclude secondary causes c. Low bone mass (T-score between -1.0 and -2.5 at the femoral neck or spine) and a 10-year probability of a hip fracture > 3% or a 10-year probability of a major osteoporosis-related fracture > 20% based on the [place]-adapted WHO algorithm d. Clinician judgment and/or patient preferences may indicate treatment for people with 10-year fracture probabilities above or below these levels FOLLOW-UP: People with diagnosed cases of osteoporosis or at high risk for fracture should have regular bone mineral density tests. For patients eligible for Medicare, routine testing is allowed once every 2 years. The testing frequency can be increased to one year for patients who have rapidly progressing disease, those who are receiving or discontinuing medical therapy to restore bone mass, or have additional risk factors. I have reviewed this report, and agree with the above findings. Mark A. Thornton Papas, M.D. [facility] Radiology Electronically Signed   By: Lavonia Dana M.D.   On: 08/07/2018 10:29   Mm 3d Screen Breast Bilateral  Result Date: 08/07/2018 CLINICAL DATA:  Screening. EXAM: DIGITAL SCREENING  BILATERAL MAMMOGRAM WITH TOMO AND CAD COMPARISON:  Previous exam(s). ACR Breast Density Category b: There are scattered areas of fibroglandular density. FINDINGS: There are no findings suspicious for malignancy. Images were processed with CAD. IMPRESSION: No mammographic evidence of malignancy. A result letter of this screening mammogram will be mailed directly to the patient. RECOMMENDATION: Screening mammogram in one year. (Code:[id number]) BI-RADS CATEGORY  1: Negative. Electronically Signed   By: Fidela Salisbury M.D.   On: 08/07/2018 10:14       Assessment & Plan:   Problem List Items Addressed This Visit    Aortic atherosclerosis ([place])    On vytorin.       Barrett's esophagus    Currently symptoms controlled.  Followed by GI.  Due f/u EGD 2021.        GERD (gastroesophageal reflux disease)    Controlled on current regimen.        Health care maintenance    Physical today 08/28/18.  PAP through gyn 03/18/18 - ok, atrophy. No HPV checked.   Mammogram 08/07/18 - Birads I.  Colonoscopy 10/2013.        History of HPV infection    Seeing gyn.  Just evaluated 03/2018.  Received pap after pt left.  PAP ok - no endocervical cells. No HPV checked.  States ok.  Continue to follow up with gyn.        Hypercholesterolemia -  Primary    On vytorin.  Low cholesterol diet and exercise.  Follow lipid panel and liver function tests.        Relevant Orders   Hepatic function panel (Completed)   Lipid panel (Completed)   Hyperglycemia    Low carb diet and exercise.  Follow met b and a1c.        Relevant Orders   Hemoglobin A1c (Completed)   Hypertension    Blood pressure doing well.  Continue current medication regimen.  Follow pressures.  Follow metabolic panel.       Relevant Orders   TSH (Completed)   Basic metabolic panel (Completed)       Einar Pheasant, MD

## 2018-08-29 ENCOUNTER — Encounter: Payer: Self-pay | Admitting: Internal Medicine

## 2018-08-31 ENCOUNTER — Encounter: Payer: Self-pay | Admitting: Internal Medicine

## 2018-08-31 NOTE — Assessment & Plan Note (Signed)
Controlled on current regimen.   

## 2018-08-31 NOTE — Assessment & Plan Note (Signed)
Blood pressure doing well.  Continue current medication regimen.  Follow pressures.  Follow metabolic panel.  

## 2018-08-31 NOTE — Assessment & Plan Note (Signed)
Currently symptoms controlled.  Followed by GI.  Due f/u EGD 2021.

## 2018-08-31 NOTE — Assessment & Plan Note (Signed)
On vytorin.  Low cholesterol diet and exercise.  Follow lipid panel and liver function tests.

## 2018-08-31 NOTE — Assessment & Plan Note (Signed)
Low carb diet and exercise.  Follow met b and a1c.   

## 2018-08-31 NOTE — Assessment & Plan Note (Signed)
Seeing gyn.  Just evaluated 03/2018.  Received pap after pt left.  PAP ok - no endocervical cells. No HPV checked.  States ok.  Continue to follow up with gyn.

## 2018-08-31 NOTE — Assessment & Plan Note (Signed)
On vytorin.

## 2018-09-01 ENCOUNTER — Other Ambulatory Visit: Payer: Self-pay | Admitting: Internal Medicine

## 2018-11-03 ENCOUNTER — Encounter: Payer: Self-pay | Admitting: Internal Medicine

## 2018-11-07 DIAGNOSIS — S4291XA Fracture of right shoulder girdle, part unspecified, initial encounter for closed fracture: Secondary | ICD-10-CM

## 2018-11-07 HISTORY — DX: Fracture of right shoulder girdle, part unspecified, initial encounter for closed fracture: S42.91XA

## 2018-11-18 ENCOUNTER — Telehealth: Payer: Self-pay | Admitting: *Deleted

## 2018-11-18 NOTE — Telephone Encounter (Signed)
Copied from [place] [id number]. Topic: General - Other >> Nov 18, 2018  3:58 PM Keene Breath wrote: Reason for CRM: Patient called to ask about getting a home health aid for assistance.  Patient stated that she needs help since she has broke her arm.  Please advise and call to discuss.  CB# [phone]

## 2018-11-19 NOTE — Telephone Encounter (Signed)
Pt is requesting home health to see if she can have someone to come out and assist her with her ADLs. Her main concern is having someone there for assistance if needed while she is bathing. She does not want to fall in the shower. She is seeing ortho this morning and is going to mention to them as well and get his thoughts about it as well. Advised that we may need a visit to discuss and for documentation purposes. She was seen 7/30. Is this something that you are agreeable to or should she try to get home health orders from ortho?

## 2018-11-19 NOTE — Telephone Encounter (Signed)
Can get recommendations from ortho.  If wants to discuss and see what is needed, I can do a doxy with pt 10:00 on Friday or 12:00 on Friday.

## 2018-11-21 NOTE — Telephone Encounter (Signed)
Patient has decided to hold on home health

## 2018-12-09 ENCOUNTER — Ambulatory Visit: Payer: Medicare Other | Admitting: Internal Medicine

## 2018-12-09 ENCOUNTER — Encounter: Payer: Self-pay | Admitting: Internal Medicine

## 2018-12-09 VITALS — BP 122/64 | HR 75 | Temp 97.4°F | Ht 63.0 in | Wt 191.8 lb

## 2018-12-09 DIAGNOSIS — K449 Diaphragmatic hernia without obstruction or gangrene: Secondary | ICD-10-CM

## 2018-12-09 DIAGNOSIS — K219 Gastro-esophageal reflux disease without esophagitis: Secondary | ICD-10-CM | POA: Diagnosis not present

## 2018-12-09 DIAGNOSIS — K2270 Barrett's esophagus without dysplasia: Secondary | ICD-10-CM | POA: Diagnosis not present

## 2018-12-09 NOTE — Assessment & Plan Note (Signed)
Continue PPI ?

## 2018-12-09 NOTE — Patient Instructions (Addendum)
Good to meet you today.  As we discussed we will plan to repeat an endoscopy around May 2023.  If you have problems with swallowing reflux weight loss or stomach issues please contact me before then.  There is much written and talked about possible health problems with the use of PPI medications (omeprazole (Prilosec), esomeprazole (Nexium), rabeprazole (Aciphex), lansoprazole (Prevacid), Dexlansoprazole (Dexilant) and pantoprazole ( Protonix).  The truth is the studies that suggest health problems with the use of PPI medications only find a weak association at best with the possible diseases reported to occur like dementia, bone fracture and kidney failure and even death. Many sicker patients with multiple health problems end up on these medications for various reasons. Establishing cause and effect in medical research is extremely difficult and though I believe there are very rare occurrences in which PPI's do cause harm in these ways, the benefits of you taking your PPI outweigh these largely theoretical and sometimes directly disproven risks.  As always, I/we will strive to have you on the lowest effective dose and frequency of a medication to keep you well and with a good quality of life.    I appreciate the opportunity to care for you. Gatha Mayer, MD, [facility]

## 2018-12-09 NOTE — Assessment & Plan Note (Signed)
Dates back to 2006.  Never had dysplasia.  Current guidelines would suggest repeating 1 5 years after the last one.  I think it is reasonable to do that even after age 73 given the morbidity of an esophageal cancer.  Probably at that time would stop surveillance.  So we will plan to repeat around May 2023 5 years after her May 2018.  She will see me as needed in the interim.  Continue PPI.

## 2018-12-09 NOTE — Progress Notes (Signed)
Kaitlin Dean 73 y.o. October 28, 1945 [id number]  Assessment & Plan:  Barrett's esophagus Dates back to 2006.  Never had dysplasia.  Current guidelines would suggest repeating 1 5 years after the last one.  I think it is reasonable to do that even after age 35 given the morbidity of an esophageal cancer.  Probably at that time would stop surveillance.  So we will plan to repeat around May 2023 5 years after her May 2018.  She will see me as needed in the interim.  Continue PPI.  Hiatal hernia with GERD Continue PPI.    I appreciate the opportunity to care for this patient. CC: Einar Pheasant, MD   Subjective:   Chief Complaint: Barrett's esophagus  HPI 73 year old white woman previously followed by Dr. Gaylyn Cheers for GERD and Barrett's esophagus going back many years to centimeter tongues without dysplasia.  Last EGD was in May 2018 and she was on a 3-year recall.  No dysphagia heartburn or indigestion problems.  She tried to come off Prilosec last year and that did not go well so she went back on it.  She had some concerns about potential side effects including dementia etc. negative screening colonoscopy other than diverticulosis 2015.  She also has a history of fundic gland polyps.  Previous EGD included 2015, same findings.  2011 2006.  All the same findings overall though she has a history of a Schatzki's ring as well has some gastritis and duodenitis.   Allergies  Allergen Reactions  . Flagyl [Metronidazole] Nausea And Vomiting   Current Meds  Medication Sig  . albuterol (PROVENTIL HFA;VENTOLIN HFA) 108 (90 Base) MCG/ACT inhaler Inhale 2 puffs into the lungs every 6 (six) hours as needed for wheezing or shortness of breath.  . Cholecalciferol (VITAMIN D3 PO) Take by mouth daily.  Marland Kitchen ezetimibe-simvastatin (VYTORIN) 10-20 MG tablet TAKE 1 TABLET BY MOUTH AT BEDTIME  . Multiple Vitamin (MULTIVITAMIN) capsule Take 1 capsule by mouth daily. AM  . omeprazole (PRILOSEC) 20 MG  capsule Take 20 mg by mouth daily. AM  . telmisartan (MICARDIS) 40 MG tablet TAKE ONE TABLET EVERY MORNING  . vitamin B-12 (CYANOCOBALAMIN) 1000 MCG tablet Take 1,000 mcg by mouth daily.  . Vitamin E 400 units TABS Take by mouth daily.   Past Medical History:  Diagnosis Date  . 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 Barrett's esophagus   . Chicken pox   . Complication of anesthesia    woke up 15 years ago and felt like could not breathe - no complications   . Diverticulosis of colon without hemorrhage 02/28/2014  . GERD (gastroesophageal reflux disease)   . History of fractured kneecap   . History of fractured pelvis   . History of hiatal hernia   . Hypercholesterolemia   . Hypertension   . Osteoarthritis   . Osteoporosis   . Pneumonia    in past  . 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 Shoulder fracture, right 11/07/2018  . Skin cancer    Past Surgical History:  Procedure Laterality Date  . BREAST EXCISIONAL BIOPSY Right 2000   benign  . BREAST SURGERY  2000   Biopsy  . CATARACT EXTRACTION W/PHACO Right 12/05/2016   Procedure: CATARACT EXTRACTION PHACO AND INTRAOCULAR LENS PLACEMENT ([place]) RIGHT SYMFONY LENS;  Surgeon: Leandrew Koyanagi, MD;  Location: [facility];  Service: Ophthalmology;  Laterality: Right;  . CATARACT EXTRACTION W/PHACO Left 02/06/2017   Procedure: CATARACT EXTRACTION PHACO AND  INTRAOCULAR LENS PLACEMENT ([place]) LEFT SYMFONY LENS;  Surgeon: Leandrew Koyanagi, MD;  Location: [facility];  Service: Ophthalmology;  Laterality: Left;  . COLONOSCOPY    . ESOPHAGOGASTRODUODENOSCOPY    . ESOPHAGOGASTRODUODENOSCOPY (EGD) WITH PROPOFOL N/A 06/11/2016   Procedure: ESOPHAGOGASTRODUODENOSCOPY (EGD) WITH PROPOFOL;  Surgeon: Manya Silvas, MD;  Location: [facility];  Service: Endoscopy;  Laterality: N/A;  . JOINT REPLACEMENT Left    knee  . PTOSIS REPAIR Bilateral 06/01/2014   Procedure: PTOSIS REPAIR;  Surgeon: Karle Starch, MD;   Location: [facility];  Service: Ophthalmology;  Laterality: Bilateral;  . SKIN CANCER EXCISION    . TOTAL KNEE ARTHROPLASTY Left 04/30/2012   Procedure: TOTAL KNEE ARTHROPLASTY;  Surgeon: Gearlean Alf, MD;  Location: [facility];  Service: Orthopedics;  Laterality: Left;  . 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   Review of Systems As per HPI.  Remainder review of systems appears negative.  Objective:   Physical Exam @BP  122/64   Pulse 75   Temp (!) 97.4 F (36.3 C)   Ht 5\' 3"  (1.6 m)   Wt 191 lb 12.8 oz (87 kg)   LMP 01/29/1993   BMI 33.98 kg/m @  General:  Well-developed, well-nourished and in no acute distress Eyes:  anicteric. ENT:   Mouth and posterior pharynx free of lesions.  Neck:   supple w/o thyromegaly or mass.  Lungs: Clear to auscultation bilaterally. Heart:   S1S2, no rubs, murmurs, gallops. Abdomen:  soft, non-tender, no hepatosplenomegaly, hernia, or mass and BS+.  Neuro:  A&O x 3.  Psych:  appropriate mood and  Affect.   Data Reviewed: See HPI

## 2018-12-16 ENCOUNTER — Ambulatory Visit: Payer: Medicare Other | Attending: Orthopedic Surgery | Admitting: Physical Therapy

## 2018-12-16 ENCOUNTER — Other Ambulatory Visit: Payer: Self-pay

## 2018-12-16 DIAGNOSIS — M6281 Muscle weakness (generalized): Secondary | ICD-10-CM | POA: Diagnosis present

## 2018-12-16 DIAGNOSIS — M25511 Pain in right shoulder: Secondary | ICD-10-CM | POA: Insufficient documentation

## 2018-12-16 DIAGNOSIS — M25611 Stiffness of right shoulder, not elsewhere classified: Secondary | ICD-10-CM | POA: Insufficient documentation

## 2018-12-16 NOTE — Therapy (Signed)
[facility] [address] [place], [address] Phone: [phone]   Fax:  [phone]  Physical Therapy Evaluation  Patient Details  Name: Kaitlin Dean MRN: [id number] Date of Birth: [birth year] Referring Provider (PT): Alveda Reasons, MD   Encounter Date: 12/16/2018  PT End of Session - 12/16/18 1100    Visit Number  1    Number of Visits  16    Date for PT Re-Evaluation  02/10/19    Authorization Type  [employer] healthcare medicare reporting from 12/16/2018    Authorization - Visit Number  1    Authorization - Number of Visits  10    PT Start Time  [phone]    PT Stop Time  1050    PT Time Calculation (min)  62 min    Activity Tolerance  Patient tolerated treatment well    Behavior During Therapy  [facility] for tasks assessed/performed       Past Medical History:  Diagnosis Date  . 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 Barrett's esophagus   . Chicken pox   . Complication of anesthesia    woke up 15 years ago and felt like could not breathe - no complications   . Diverticulosis of colon without hemorrhage 02/28/2014  . GERD (gastroesophageal reflux disease)   . History of fractured kneecap   . History of fractured pelvis   . History of hiatal hernia   . Hypercholesterolemia   . Hypertension   . Osteoarthritis   . Osteoporosis   . Pneumonia    in past  . 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 Shoulder fracture, right 11/07/2018  . Skin cancer     Past Surgical History:  Procedure Laterality Date  . BREAST EXCISIONAL BIOPSY Right 2000   benign  . BREAST SURGERY  2000   Biopsy  . CATARACT EXTRACTION W/PHACO Right 12/05/2016   Procedure: CATARACT EXTRACTION PHACO AND INTRAOCULAR LENS PLACEMENT ([place]) RIGHT SYMFONY LENS;  Surgeon: Leandrew Koyanagi, MD;  Location: [facility];  Service: Ophthalmology;  Laterality: Right;  . CATARACT EXTRACTION W/PHACO Left 02/06/2017   Procedure:  CATARACT EXTRACTION PHACO AND INTRAOCULAR LENS PLACEMENT ([place]) LEFT SYMFONY LENS;  Surgeon: Leandrew Koyanagi, MD;  Location: [facility];  Service: Ophthalmology;  Laterality: Left;  . COLONOSCOPY    . ESOPHAGOGASTRODUODENOSCOPY    . ESOPHAGOGASTRODUODENOSCOPY (EGD) WITH PROPOFOL N/A 06/11/2016   Procedure: ESOPHAGOGASTRODUODENOSCOPY (EGD) WITH PROPOFOL;  Surgeon: Manya Silvas, MD;  Location: [facility] END[facility]COPY;  Service: Endoscopy;  Laterality: N/A;  . JOINT REPLACEMENT Left    knee  . PTOSIS REPAIR Bilateral 06/01/2014   Procedure: PTOSIS REPAIR;  Surgeon: Karle Starch, MD;  Location: [facility] [place];  Service: Ophthalmology;  Laterality: Bilateral;  . SKIN CANCER EXCISION    . TOTAL KNEE ARTHROPLASTY Left 04/30/2012   Procedure: TOTAL KNEE ARTHROPLASTY;  Surgeon: Gearlean Alf, MD;  Location[facility];  Service: Orthopedics;  Laterality: Left;  . TUBAL LIGATION      There were no vitals filed for this visit.   Subjective Assessment - 12/16/18 1006    Subjective  Patient was in her undeveloped lot next to her house and she was clearing some ivy. She was walking over some wood chips and must have stepped in a hole and fell. She landed on the left side but didn't hurt because it was mulch. Her R arm flew up and the force was so much  and it broke her humerus. She couldn't move her arm or get up at first. Her friend was strong enough to pull her up the second attempt. She went to urgent care immediately at [facility]. Radiographs confirmed the fracture and they put her in a wheelchair. When she got home she made an appointment with [facility] because she had been treated by them last year for fracture of L humerus. At that time she slid down a wet hill and landed on the left arm. These are the only two times she has fallen. After breaking her R humerus, she was given some pain medications but she only needed about 5 before she switched to OTC pain medications. She was put in a  sling until about 1-2 weeks ago. Her last visit with Dr. Mack Guise showed the bones were healing well and he suggested weaning out of the sling, which she has done. 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 osteoporosis    Limitations  Other (comment)   Functional limitations: cannot fix hair or reach top of head in the back, was restricted in eating with R UE, driving, pushing, seatbelt, reaching, lifting above shoulder, dressing and bathing, gardening, cleaning house, swim backstroke.   Diagnostic tests  radiograph showed fracture of R proximal humerous    Patient Stated Goals  "to get my arms back like they used to be" "would like to be able to swim backstroke"    Currently in Pain?  No/denies    Pain Score  0-No pain   best: 0/10; worst: 3/10   Pain Location  Shoulder    Pain Orientation  Right;Lateral;Proximal    Pain Descriptors / Indicators  Tightness    Pain Type  Acute pain    Pain Radiating Towards  denies numbenss or tingling    Pain Onset  More than a month ago    Pain Frequency  Intermittent    Aggravating Factors   using it a lot, laying on the right side, reaching overhead.    Pain Relieving Factors  nice hot shower while spraying hot water on the arm, holding it various positions, pain medications    Effect of Pain on Daily Activities  Functional limitations: cannot fix hair or reach top of head in the  back, was restricted in eating with R UE, driving, pushing, seatbelt, reaching, lifting above shoulder, dressing and bathing, gardening, cleaning house, swim backstroke, cooking        [facility] PT Assessment - 12/16/18 0001      Assessment   Medical Diagnosis  closed fracture of proximal R humerous    Referring Provider (PT)  Alveda Reasons, MD    Onset Date/Surgical Date  11/07/18   approximately 12/23/2018   Hand Dominance  Right    Next MD Visit  --   approximately 12/23/2018   Prior Therapy  1 visit of PT at [facility] prior to this episode of care  Precautions   Precaution Comments   "She may begin to wean out of the sling, but should avoid any weightbearing or lifting with the right upper extremity until her follow up with me again in 3 weeks" (12/23/2018). patient unaware of these at initial eval      Restrictions   Weight Bearing Restrictions  Yes    RUE Weight Bearing  Non weight bearing    Other Position/Activity Restrictions   "She may begin to wean out of the sling, but should avoid any weightbearing or lifting with the right upper extremity until her follow up with me again in 3 weeks" (12/23/2018). Reports she was told "let pain be your judgement"       Balance Screen   Has the patient fallen in the past 6 months  Yes    How many times?  1    Has the patient had a decrease in activity level because of a fear of falling?   No    Is the patient reluctant to leave their home because of a fear of falling?   No      Home Environment   Living Environment  Private residence    Living Arrangements  Alone    Type of [place] Access  Level entry    Home Layout  One level    [place]  None      Prior Function   Level of [place]  Retired    [employer]  retired Education officer, museum    Leisure  work with plants (lots of houseplants), be with grandchildren      Cognition   Overall Cognitive Status  Within Functional  Limits for tasks assessed      Observation/Other Assessments   Observations  please see note from 12/16/2018 for latest objective data    Focus on Therapeutic Outcomes (FOTO)   FOTO = 55 (12/16/2018);         OBJECTIVE  OBSERVATION/INSPECTION . Posture: forward head, rounded shoulders, slumped in sitting, R shoulder slightly lower than left.  . Tremor: none . Muscle bulk: generally overall mildly decreased consistent with lack of regular adequate exercise or physical activity.   . Skin: WNL  . Gait: grossly WFL for household and short community ambulation. More detailed gait analysis deferred to later date as needed.    NEUROLOGICAL  Dermatomes . C2-T1 appears equal and intact to light touch.    PERIPHERAL JOINT MOTION (in degrees)  *Indicates pain, R/L Date    Joint/Motion AROM PROM Comments  Shoulder     Flexion 55/140 112*/145 AROM feels tight R, PROM empty end feel  Extension 43*/60 /   Abduction 55*/143 90*/166   External rotation 45*/35 48*/60   Internal rotation L3*/T8 45/60    Comments: B elbows and wrist WNL; compensatory shoulder hike on R with shoulder elevation.   MUSCLE PERFORMANCE (MMT):  Comments: Deferred due to precautions.  ACCESSORY MOTION:  - Mild hypomobility in R [place] joint all directions .  PALPATION: - No tenderness to palpation at R shoulder region  FUNCTIONAL MOBILITY: - Bed mobility: WFL. - Transfers: WFL  EDUCATION/COGNITION: Patient is alert and oriented X 4.  Objective measurements completed on examination: See above findings.     TREATMENT:  Therapeutic exercise: to centralize symptoms and improve ROM, strength, muscular endurance, and activity tolerance required for successful completion of functional activities.  - attempted supine AAROM R flexion with self assistance  from L UE, but significant shoulder hike and superior shoulder pain.  - attempted wall slide AAROM flexion for R shoulder, but to difficult.  - seated pulley  AAROM for flexion and abduction on R shoulder x 10 each direction Cuing for form and pain/discomfort interpretation.  - Education on exercise purpose/form. Self management techniques. Education on diagnosis, prognosis, POC, anatomy and physiology of current condition Education on HEP including handout. Educated on documented activity restrictions from MD.  [place] Access Code: [email or web address]  URL: [email or web address]  Date: 12/16/2018  Prepared by: Rosita Kea   Exercises  Seated Shoulder Flexion AAROM with Pulley Behind - 2 sets - 20 reps - 3 seconds hold - 2x daily - 7x weekly  Seated Shoulder Abduction AAROM with Pulley Behind - 2 sets - 20 reps - 3 seconds hold - 2x daily - 7x weekly  .  PT Education - 12/16/18 1019    Education Details  - Exercise purpose/form. Self management techniques. Education on diagnosis, prognosis, POC, anatomy and physiology of current condition Education on HEP including handout    Person(s) Educated  Patient    Methods  Explanation;Demonstration;Tactile cues;Verbal cues;Handout    Comprehension  Verbalized understanding;Returned demonstration;Verbal cues required;Need further instruction;Tactile cues required       PT Short Term Goals - 12/16/18 1108      PT SHORT TERM GOAL #1   Title  Be independent with initial home exercise program for self-management of symptoms.    Baseline  initial HEP provided at IE (12/16/2018)    Time  2    Period  Weeks    Status  New    Target Date  12/30/18        PT Long Term Goals - 12/16/18 1108      PT LONG TERM GOAL #1   Title  Be independent with a long-term home exercise program for self-management of symptoms    Baseline  Initial HEP provided at IE (12/16/2018);    Time  8    Period  Weeks    Status  New    Target Date  02/10/19      PT LONG TERM GOAL #2   Title  emonstrate improved FOTO score by 10 units to demonstrate improvement in overall condition and self-reported  functional ability.    Baseline  FOTO = 55 (12/16/2018);    Time  8    Period  Weeks    Status  New    Target Date  02/10/19      PT LONG TERM GOAL #3   Title  Demonstrate R shoulder AROM equal to or greater than L shoulder AROM with 1/10 or less pain in all planes to allow patient to complete valued activities with less difficulty.    Baseline  see objective measures (12/16/2018);    Time  8    Period  Weeks    Status  New    Target Date  02/10/19      PT LONG TERM GOAL #4   Title  Demonstrate R shoulder MMT 4/5 or greater to improve ability to perform reaching, carrying, and lifting tasks.    Baseline  testing deferred due to precautions (12/16/2018);    Time  8    Period  Weeks    Status  New    Target Date  02/10/19      PT LONG TERM GOAL #5   Title  Complete community, work and/or recreational activities without limitation due to current condition.  Baseline  Functional limitations: cannot fix hair or reach top of head in the back, was restricted in eating with R UE, driving, pushing, seatbelt, reaching, lifting above shoulder, dressing and bathing, gardening, cleaning house, swim backstroke, cooking (12/16/2018);    Time  8    Period  Weeks    Status  New    Target Date  02/10/19             Plan - 12/16/18 1103    Clinical Impression Statement  Patient is a 73 y.o. female referred to outpatient physical therapy with a medical diagnosis of closed fracture of proximal R humerous who presents with signs and symptoms consistent with R shoulder pain, stiffness, weakness, and incoordination consistent with proximal humeral fracture 11/07/2018. Patient presents with significant ROM, joint stiffness, muscle performance (strength/power/endurance), glenohumeral rhythm, coordination, posture impairments that are limiting ability to complete any activities that require use of R UE including reaching, gardening, fixing hair, lifting, dressing, bathing, ADLs, IADLs, swimming  backstroke, cooking, and cleaning house without difficulty. Patient will benefit from skilled physical therapy intervention to address current body structure impairments and activity limitations to improve function and work towards goals set in current POC in order to return to prior level of function or maximal functional improvement.    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    Examination-Activity Limitations  Bathing;Lift;Reach Overhead;Carry;Dressing;Hygiene/Grooming;Self Feeding;Other   cannot fix hair or reach top of head in the back, was restricted in eating with R UE, driving, pushing, seatbelt, reaching, lifting above shoulder, dressing and bathing, gardening, cleaning house, swim backstroke, cooking.   Examination-Participation Restrictions  Community Activity;Driving;Laundry;Meal Prep;Yard Work    Stability/Clinical Decision Making  Stable/Uncomplicated    Designer, jewellery  Low    Rehab Potential  Good    PT Frequency  2x / week    PT Duration  8 weeks    PT Treatment/Interventions  ADLs/Self Care Home Management;Aquatic Therapy;Electrical Stimulation;Cryotherapy;Moist Heat;Functional mobility training;Therapeutic activities;Therapeutic exercise;Neuromuscular re-education;Patient/family education;Manual techniques;Passive range of motion;Dry needling;Spinal Manipulations;Joint Manipulations    PT Next Visit Plan  progressive ROM and strengthening as tolerated    PT Home Exercise Plan  Medbridge Access Code: [email or web address]    Consulted and Agree with Plan of  Care  Patient       Patient will benefit from skilled therapeutic intervention in order to improve the following deficits and impairments:  Increased fascial restricitons, Pain, Improper body mechanics, Postural dysfunction, Increased muscle spasms, Decreased mobility, Decreased coordination, Decreased activity tolerance, Decreased endurance, Decreased range of motion, Decreased strength, Hypomobility, Impaired perceived functional ability, Impaired UE functional use, Impaired flexibility, Decreased knowledge of precautions  Visit Diagnosis: Acute pain of right shoulder  Stiffness of right shoulder, not elsewhere classified  Muscle weakness (generalized)     Problem List Patient Active Problem List   Diagnosis Date Noted  . Aortic atherosclerosis ([place]) 08/28/2016  . Throat irritation 02/20/2015  . Hyperglycemia 09/01/2014  . Health care maintenance 08/07/2014  . Diverticulosis of colon without hemorrhage 02/28/2014  . Breast pain, right 11/12/2013  . Breast thickening 11/12/2013  . Irregular heart rhythm 02/22/2013  . OA (osteoarthritis) of knee  04/30/2012  . Skin cancer 02/10/2012  . Hypertension 02/04/2012  . Hypercholesterolemia 02/04/2012  . Hiatal hernia with GERD 02/04/2012  . History of HPV infection 02/04/2012  . Barrett's esophagus 02/04/2012    Everlean Alstrom. Graylon Good, PT, DPT 12/16/18, 11:14 AM  [facility] [address], [place], [address] Phone: [phone]   Fax:  [phone]  Name: Kaitlin Dean MRN: [id number] Date of Birth: Jul 18, 1945

## 2018-12-18 ENCOUNTER — Ambulatory Visit: Payer: Medicare Other | Admitting: Physical Therapy

## 2018-12-18 ENCOUNTER — Other Ambulatory Visit: Payer: Self-pay

## 2018-12-18 ENCOUNTER — Encounter: Payer: Self-pay | Admitting: Physical Therapy

## 2018-12-18 DIAGNOSIS — M25611 Stiffness of right shoulder, not elsewhere classified: Secondary | ICD-10-CM

## 2018-12-18 DIAGNOSIS — M25511 Pain in right shoulder: Secondary | ICD-10-CM

## 2018-12-18 DIAGNOSIS — M6281 Muscle weakness (generalized): Secondary | ICD-10-CM

## 2018-12-18 NOTE — Therapy (Signed)
[facility] [address], [place], [address] Phone: [phone]   Fax:  [phone]  Physical Therapy Treatment  Patient Details  Name: Kaitlin Dean MRN: [id number] Date of Birth: Dec 08, 1945 Referring Provider (PT): Alveda Reasons, MD   Encounter Date: 12/18/2018  PT End of Session - 12/18/18 1039    Visit Number  2    Number of Visits  16    Date for PT Re-Evaluation  02/10/19    Authorization Type  [employer] medicare reporting from 12/16/2018    Authorization - Visit Number  2    Authorization - Number of Visits  10    PT Start Time  [id number]    PT Stop Time  1113    PT Time Calculation (min)  38 min    Activity Tolerance  Patient tolerated treatment well    Behavior During Therapy  [facility] for tasks assessed/performed       Past Medical History:  Diagnosis Date  . 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 Barrett's esophagus   . Chicken pox   . Complication of anesthesia    woke up 15 years ago and felt like could not breathe - no complications   . Diverticulosis of colon without hemorrhage 02/28/2014  . GERD (gastroesophageal reflux disease)   . History of fractured kneecap   . History of fractured pelvis   . History of hiatal hernia   . Hypercholesterolemia   . Hypertension   . Osteoarthritis   . Osteoporosis   . Pneumonia    in past  . 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 Shoulder fracture, right 11/07/2018  . Skin cancer     Past Surgical History:  Procedure Laterality Date  . BREAST EXCISIONAL BIOPSY Right 2000   benign  . BREAST SURGERY  2000   Biopsy  . CATARACT EXTRACTION W/PHACO Right 12/05/2016   Procedure: CATARACT EXTRACTION PHACO AND INTRAOCULAR LENS PLACEMENT ([place]) RIGHT SYMFONY LENS;  Surgeon: Leandrew Koyanagi, MD;  Location: [place];  Service: Ophthalmology;  Laterality: Right;  . CATARACT EXTRACTION W/PHACO Left 02/06/2017   Procedure:  CATARACT EXTRACTION PHACO AND INTRAOCULAR LENS PLACEMENT ([place]) LEFT SYMFONY LENS;  Surgeon: Leandrew Koyanagi, MD;  Location: [place];  Service: Ophthalmology;  Laterality: Left;  . COLONOSCOPY    . ESOPHAGOGASTRODUODENOSCOPY    . ESOPHAGOGASTRODUODENOSCOPY (EGD) WITH PROPOFOL N/A 06/11/2016   Procedure: ESOPHAGOGASTRODUODENOSCOPY (EGD) WITH PROPOFOL;  Surgeon: Manya Silvas, MD;  Location[facility];  Service: Endoscopy;  Laterality: N/A;  . JOINT REPLACEMENT Left    knee  . PTOSIS REPAIR Bilateral 06/01/2014   Procedure: PTOSIS REPAIR;  Surgeon: Karle Starch, MD;  Location: [place];  Service: Ophthalmology;  Laterality: Bilateral;  . SKIN CANCER EXCISION    . TOTAL KNEE ARTHROPLASTY Left 04/30/2012   Procedure: TOTAL KNEE ARTHROPLASTY;  Surgeon: Gearlean Alf, MD;  Location: [facility];  Service: Orthopedics;  Laterality: Left;  . TUBAL LIGATION      There were no vitals filed for this visit.  Subjective Assessment - 12/18/18 1037    Subjective  Patient reports she is feeling pretty good today. She has no pain at rest. She reports her HEP is going well. She had no excessive soreness following last treatment.    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 osteoporosis    Limitations  Other (comment)   Functional limitations: cannot fix hair or reach top of head in the back, was restricted in eating with R UE, driving, pushing, seatbelt, reaching, lifting above shoulder, dressing and bathing, gardening, cleaning house, swim backstroke.   Diagnostic tests  radiograph showed fracture of R proximal humerous    Patient Stated  Goals  "to get my arms back like they used to be" "would like to be able to swim backstroke"    Currently in Pain?  No/denies    Pain Onset  More than a month ago       TREATMENT:  Therapeutic exercise:to centralize symptoms and improve ROM, strength, muscular endurance, and activity tolerance required for successful completion of functional activities.    - seated pulley AAROM for flexion and abduction on R shoulder 2x2 minutes each direction Cuing for form and to hold.   Supine R shoulder AAROM:  - supine AAROM flexion with PVC stick, 3x10 - supine AAROM chest press with PVC stick, 3x10 - supine AAROM ER with PVC stick, x20 Cuing for form and range  Standing R shoulder AAROM with ball: - horizontal abduction/adduction x 20  Cuing for form and body position  Education on HEP including handout   Manual therapy: to reduce pain and tissue tension, improve range of motion, neuromodulation, in order to promote improved ability to complete functional activities. - supine STM to R shoulder region to decrease muscle tension and improve motion.  - supine PROM with overpressure to tolerance flexion, abduction, IR, ER. x20 times each direction.    HOME EXERCISE PROGRAM Access Code: [email or web address]  URL: [email or web address]  Date: 12/18/2018  Prepared by: Rosita Kea   Exercises  Seated Shoulder Flexion AAROM with Pulley Behind - 2 sets - 20 reps - 3 seconds hold - 2x daily - 7x weekly  Seated Shoulder Abduction AAROM with Pulley Behind - 2 sets - 20 reps - 3 seconds hold - 2x daily - 7x weekly  Supine Shoulder Flexion with [place] - 2 sets - 20 reps - 3 seconds hold - 2x daily - 7x weekly  Supine Shoulder External Rotation with Dowel - 2 sets - 20 reps - 3 seconds hold - 2x daily - 7x weekly     PT Education - 12/18/18 1039    Education Details  - Exercise purpose/form. Self management techniques.    Person(s) Educated  Patient    Methods   Explanation;Demonstration;Tactile cues;Verbal cues    Comprehension  Verbalized understanding;Returned demonstration;Verbal cues required;Tactile cues required;Need further instruction       PT Short Term Goals - 12/16/18 1108      PT SHORT TERM GOAL #1   Title  Be independent with initial home exercise program for self-management of symptoms.    Baseline  initial HEP provided at IE (12/16/2018)    Time  2    Period  Weeks    Status  New    Target Date  12/30/18        PT Long Term Goals - 12/16/18 1108      PT LONG TERM GOAL #1   Title  Be independent with a long-term home exercise program for self-management of symptoms    Baseline  Initial HEP provided at IE (12/16/2018);    Time  8    Period  Weeks    Status  New    Target Date  02/10/19      PT LONG TERM GOAL #2   Title  emonstrate improved FOTO score by 10 units to demonstrate improvement in overall condition and self-reported functional ability.    Baseline  FOTO = 55 (12/16/2018);    Time  8    Period  Weeks    Status  New    Target Date  02/10/19      PT LONG TERM GOAL #3   Title  Demonstrate R shoulder AROM equal to or greater than L shoulder AROM with 1/10 or less pain in all planes to allow patient to complete valued activities with less difficulty.    Baseline  see objective measures (12/16/2018);    Time  8    Period  Weeks    Status  New    Target Date  02/10/19      PT LONG TERM GOAL #4   Title  Demonstrate R shoulder MMT 4/5 or greater to improve ability to perform reaching, carrying, and lifting tasks.    Baseline  testing deferred due to precautions (12/16/2018);    Time  8    Period  Weeks    Status  New    Target Date  02/10/19      PT LONG TERM GOAL #5   Title  Complete community, work and/or recreational activities without limitation due to current condition.    Baseline  Functional limitations: cannot fix hair or reach top of head in the back, was restricted in eating with R UE, driving,  pushing, seatbelt, reaching, lifting above shoulder, dressing and bathing, gardening, cleaning house, swim backstroke, cooking (12/16/2018);    Time  8    Period  Weeks    Status  New    Target Date  02/10/19            Plan - 12/18/18 1114    Clinical Impression Statement  Pt tolerated treatment well and continues to make progress towards goals. Pt was able to complete all exercises with minimal to no lasting increase in pain or discomfort. She advanced to more AAROM exercises and responded well to manual therapy. Continues to have a guarded/empty end feel at end range but noted for improved relaxation with manual therapy. Recommend progression to gentle isometrics next session. Pt required multimodal cuing for proper technique and to facilitate improved neuromuscular control, strength, range of motion, and functional ability resulting in improved performance and form. Patient would benefit from continued physical therapy to address remaining impairments and functional limitations to work towards stated goals and return to PLOF or maximal functional independence.    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    Examination-Activity Limitations  Bathing;Lift;Reach Overhead;Carry;Dressing;Hygiene/Grooming;Self Feeding;Other   cannot fix hair or reach top of head in the back, was restricted in eating with R UE, driving, pushing, seatbelt, reaching, lifting above shoulder, dressing and bathing, gardening, cleaning house, swim backstroke, cooking.   Examination-Participation  Restrictions  Community Activity;Driving;Laundry;Meal Prep;Yard Work    Stability/Clinical Decision Making  Stable/Uncomplicated  Rehab Potential  Good    PT Frequency  2x / week    PT Duration  8 weeks    PT Treatment/Interventions  ADLs/Self Care Home Management;Aquatic Therapy;Electrical Stimulation;Cryotherapy;Moist Heat;Functional mobility training;Therapeutic activities;Therapeutic exercise;Neuromuscular re-education;Patient/family education;Manual techniques;Passive range of motion;Dry needling;Spinal Manipulations;Joint Manipulations    PT Next Visit Plan  progressive ROM and strengthening as tolerated    PT Home Exercise Plan  Medbridge Access Code: [email or web address]    Consulted and Agree with Plan of Care  Patient       Patient will benefit from skilled therapeutic intervention in order to improve the following deficits and impairments:  Increased fascial restricitons, Pain, Improper body mechanics, Postural dysfunction, Increased muscle spasms, Decreased mobility, Decreased coordination, Decreased activity tolerance, Decreased endurance, Decreased range of motion, Decreased strength, Hypomobility, Impaired perceived functional ability, Impaired UE functional use, Impaired flexibility, Decreased knowledge of precautions  Visit Diagnosis: Acute pain of right shoulder  Stiffness of right shoulder, not elsewhere classified  Muscle weakness (generalized)     Problem List Patient Active Problem List   Diagnosis Date Noted  . Aortic atherosclerosis ([place]) 08/28/2016  . Throat irritation 02/20/2015  . Hyperglycemia 09/01/2014  . Health care maintenance 08/07/2014  . Diverticulosis of colon without hemorrhage 02/28/2014  . Breast pain, right 11/12/2013  . Breast thickening 11/12/2013  . Irregular heart rhythm 02/22/2013  . OA (osteoarthritis) of knee 04/30/2012  . Skin cancer 02/10/2012  . Hypertension 02/04/2012  . Hypercholesterolemia 02/04/2012  . Hiatal hernia with GERD  02/04/2012  . History of HPV infection 02/04/2012  . Barrett's esophagus 02/04/2012   Everlean Alstrom. Graylon Good, PT, DPT 12/18/18, 11:15 AM   [place] [facility] [address] [place], [address] Phone: [phone]   Fax:  [phone]  Name: Kaitlin Dean MRN: [id number] Date of Birth: [birth year]

## 2018-12-23 ENCOUNTER — Ambulatory Visit: Payer: Medicare Other | Admitting: Physical Therapy

## 2018-12-23 ENCOUNTER — Encounter: Payer: Self-pay | Admitting: Physical Therapy

## 2018-12-23 ENCOUNTER — Other Ambulatory Visit: Payer: Self-pay

## 2018-12-23 DIAGNOSIS — M25511 Pain in right shoulder: Secondary | ICD-10-CM

## 2018-12-23 DIAGNOSIS — M25611 Stiffness of right shoulder, not elsewhere classified: Secondary | ICD-10-CM

## 2018-12-23 DIAGNOSIS — M6281 Muscle weakness (generalized): Secondary | ICD-10-CM

## 2018-12-23 NOTE — Therapy (Signed)
[facility] [address] [place], [address] Phone: [phone]   Fax:  [phone]  Physical Therapy Treatment  Patient Details  Name: Kaitlin Dean MRN: [id number] Date of Birth: 1945/06/16 Referring Provider (PT): Alveda Reasons, MD   Encounter Date: 12/23/2018  PT End of Session - 12/23/18 1037    Visit Number  3    Number of Visits  16    Date for PT Re-Evaluation  02/10/19    Authorization Type  [employer] medicare reporting from 12/16/2018    Authorization - Visit Number  3    Authorization - Number of Visits  10    PT Start Time  1033    PT Stop Time  1112    PT Time Calculation (min)  39 min    Activity Tolerance  Patient tolerated treatment well    Behavior During Therapy  [facility] for tasks assessed/performed       Past Medical History:  Diagnosis Date  . 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 Barrett's esophagus   . Chicken pox   . Complication of anesthesia    woke up 15 years ago and felt like could not breathe - no complications   . Diverticulosis of colon without hemorrhage 02/28/2014  . GERD (gastroesophageal reflux disease)   . History of fractured kneecap   . History of fractured pelvis   . History of hiatal hernia   . Hypercholesterolemia   . Hypertension   . Osteoarthritis   . Osteoporosis   . Pneumonia    in past  . 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 Shoulder fracture, right 11/07/2018  . Skin cancer     Past Surgical History:  Procedure Laterality Date  . BREAST EXCISIONAL BIOPSY Right 2000   benign  . BREAST SURGERY  2000   Biopsy  . CATARACT EXTRACTION W/PHACO Right 12/05/2016   Procedure: CATARACT EXTRACTION PHACO AND INTRAOCULAR LENS PLACEMENT ([place]) RIGHT SYMFONY LENS;  Surgeon: Leandrew Koyanagi, MD;  Location: [facility];  Service: Ophthalmology;  Laterality: Right;  . CATARACT EXTRACTION W/PHACO Left 02/06/2017   Procedure:  CATARACT EXTRACTION PHACO AND INTRAOCULAR LENS PLACEMENT ([place]) LEFT SYMFONY LENS;  Surgeon: Leandrew Koyanagi, MD;  Location: [facility];  Service: Ophthalmology;  Laterality: Left;  . COLONOSCOPY    . ESOPHAGOGASTRODUODENOSCOPY    . ESOPHAGOGASTRODUODENOSCOPY (EGD) WITH PROPOFOL N/A 06/11/2016   Procedure: ESOPHAGOGASTRODUODENOSCOPY (EGD) WITH PROPOFOL;  Surgeon: Manya Silvas, MD;  Location: [facility];  Service: Endoscopy;  Laterality: N/A;  . JOINT REPLACEMENT Left    knee  . PTOSIS REPAIR Bilateral 06/01/2014   Procedure: PTOSIS REPAIR;  Surgeon: Karle Starch, MD;  Location: [facility];  Service: Ophthalmology;  Laterality: Bilateral;  . SKIN CANCER EXCISION    . TOTAL KNEE ARTHROPLASTY Left 04/30/2012   Procedure: TOTAL KNEE ARTHROPLASTY;  Surgeon: Gearlean Alf, MD;  Location: [facility];  Service: Orthopedics;  Laterality: Left;  . TUBAL LIGATION      There were no vitals filed for this visit.  Subjective Assessment - 12/23/18 1033    Subjective  Patient reports she has been "working her arm pretty hard" with houshold activities, such as decorating for christmas. She just gets achy a bit by the end of the day so she takes OTC medication for that in the evenings. She has been doing her HEP, especially the pulleys and the mop handle to raise arm up  above her. She is assisting her R arm with her L arm whenever she opens her blinds. She denies pain upon arrival today. She reports she felt tired and a bit sore later in the day after her last treatmnet session. She has an MD follow up next week.    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 osteoporosis    Limitations  Other (comment)   Functional limitations: cannot fix hair or reach top of head in the back, was restricted in eating with R UE, driving, pushing, seatbelt, reaching, lifting above shoulder, dressing and bathing, gardening, cleaning house, swim backstroke.   Diagnostic tests  radiograph showed fracture of R proximal humerous    Patient Stated Goals  "to get my arms back like they used to be" "would like to be able to swim backstroke"    Currently in Pain?  No/denies    Pain Onset  More than a month ago       TREATMENT: Therapeutic exercise:to centralize symptoms and improve ROM, strength, muscular endurance, and activity tolerance required for successful completion of functional activities.  - Upper body ergometer with no added resistance encourage joint nutrition, warm tissue, induce analgesic effect of aerobic exercise, improve muscular strength and endurance,  and prepare for remainder of session. X 5 minutes during subjective exam. Cuing for technique.    Flexion wall slide AAROM for R shoulder x 5. Discontinued due to inadequate form (but improved from last attempt).   Supine R shoulder AAROM:  - supine AAROM flexion with PVC stick, x10, 145 degrees after first 5 - horizontal abduction/adduction with PVC stick, x10 Cuing for form and range  Standing R shoulder AAROM with ball: - horizontal abduction/adduction x 20  - flexion/extension x 20 - circle CW and CCW x 20 each.  Cuing for form and body position  Education on HEP including handout   Manual therapy: to reduce pain and tissue tension, improve range of motion, neuromodulation, in order to promote improved ability to complete functional activities. - supine STM to R shoulder region to decrease muscle tension and improve motion.  - supine PROM with overpressure to tolerance flexion (140), abduction (130), IR, ER (70). x10 times each direction.    HOME EXERCISE PROGRAM Access  Code: [email or web address]  URL: [email or web address]  Date: 12/18/2018  Prepared by: Rosita Kea   Exercises  Seated Shoulder Flexion AAROM with Pulley Behind - 2 sets - 20 reps - 3 seconds hold - 2x daily - 7x weekly  Seated Shoulder Abduction AAROM with Pulley Behind - 2 sets - 20 reps - 3 seconds hold - 2x daily - 7x weekly  Supine Shoulder Flexion with Dowel - 2 sets - 20 reps - 3 seconds hold - 2x daily - 7x weekly  Supine Shoulder External Rotation with Dowel - 2 sets - 20 reps - 3 seconds hold - 2x daily - 7x weekly     PT Education - 12/23/18 1114    Education Details  Exercise purpose/form. Self management techniques.    Person(s) Educated  Patient    Methods  Explanation;Demonstration;Tactile cues;Verbal cues    Comprehension  Verbalized understanding;Returned demonstration;Verbal cues required;Tactile cues required;Need further instruction  PT Short Term Goals - 12/23/18 1113      PT SHORT TERM GOAL #1   Title  Be independent with initial home exercise program for self-management of symptoms.    Baseline  initial HEP provided at IE (12/16/2018)    Time  2    Period  Weeks    Status  Achieved    Target Date  12/30/18        PT Long Term Goals - 12/16/18 1108      PT LONG TERM GOAL #1   Title  Be independent with a long-term home exercise program for self-management of symptoms    Baseline  Initial HEP provided at IE (12/16/2018);    Time  8    Period  Weeks    Status  New    Target Date  02/10/19      PT LONG TERM GOAL #2   Title  emonstrate improved FOTO score by 10 units to demonstrate improvement in overall condition and self-reported functional ability.    Baseline  FOTO = 55 (12/16/2018);    Time  8    Period  Weeks    Status  New    Target Date  02/10/19      PT LONG TERM GOAL #3   Title  Demonstrate R shoulder AROM equal to or greater than L shoulder AROM with 1/10 or less pain in all planes to allow patient to complete valued activities  with less difficulty.    Baseline  see objective measures (12/16/2018);    Time  8    Period  Weeks    Status  New    Target Date  02/10/19      PT LONG TERM GOAL #4   Title  Demonstrate R shoulder MMT 4/5 or greater to improve ability to perform reaching, carrying, and lifting tasks.    Baseline  testing deferred due to precautions (12/16/2018);    Time  8    Period  Weeks    Status  New    Target Date  02/10/19      PT LONG TERM GOAL #5   Title  Complete community, work and/or recreational activities without limitation due to current condition.    Baseline  Functional limitations: cannot fix hair or reach top of head in the back, was restricted in eating with R UE, driving, pushing, seatbelt, reaching, lifting above shoulder, dressing and bathing, gardening, cleaning house, swim backstroke, cooking (12/16/2018);    Time  8    Period  Weeks    Status  New    Target Date  02/10/19            Plan - 12/23/18 1112    Clinical Impression Statement  Patient tolerated treatment well and is making progress towards goals at this point. She continues to show gains in ROM and pain reduction. Patient continues to demonstrate compensatory shoulder hike with wall slides and is limited in functional mobility at home and weight bearing/lfiting restrictions.  Pt required multimodal cuing for proper technique and to facilitate improved neuromuscular control, strength, range of motion, and functional ability resulting in improved performance and form. Patient would benefit from continued physical therapy to address remaining impairments and functional limitations to work towards stated goals and return to PLOF or maximal functional independence.    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    Examination-Activity Limitations  Bathing;Lift;Reach Overhead;Carry;Dressing;Hygiene/Grooming;Self Feeding;Other   cannot fix hair or reach top of head in the back, was restricted in eating with R UE, driving, pushing, seatbelt, reaching, lifting above shoulder, dressing and bathing, gardening, cleaning house, swim backstroke, cooking.   Examination-Participation Restrictions  Community Activity;Driving;Laundry;Meal Prep;Yard Work    Stability/Clinical Decision Making  Stable/Uncomplicated    Rehab Potential  Good    PT Frequency  2x / week    PT Duration  8 weeks    PT Treatment/Interventions  ADLs/Self Care Home Management;Aquatic Therapy;Electrical Stimulation;Cryotherapy;Moist Heat;Functional mobility training;Therapeutic activities;Therapeutic exercise;Neuromuscular re-education;Patient/family education;Manual techniques;Passive range of motion;Dry needling;Spinal Manipulations;Joint Manipulations    PT Next Visit Plan  progressive ROM and strengthening as tolerated    PT Home Exercise Plan  Medbridge Access Code: [email or web address]    Consulted and Agree with Plan of Care  Patient       Patient will benefit from skilled therapeutic intervention in order to improve the following deficits and impairments:  Increased fascial restricitons, Pain, Improper body mechanics, Postural dysfunction, Increased muscle spasms, Decreased mobility, Decreased coordination, Decreased activity tolerance, Decreased endurance, Decreased range of motion, Decreased strength, Hypomobility, Impaired perceived functional ability, Impaired UE functional use, Impaired flexibility, Decreased knowledge of precautions  Visit Diagnosis: Acute pain of right shoulder  Stiffness of right shoulder, not elsewhere  classified  Muscle weakness (generalized)     Problem List Patient Active Problem List   Diagnosis Date Noted  . Aortic atherosclerosis ([place]) 08/28/2016  . Throat irritation 02/20/2015  . Hyperglycemia 09/01/2014  . Health care maintenance 08/07/2014  . Diverticulosis of colon without hemorrhage 02/28/2014  . Breast pain, right 11/12/2013  . Breast thickening 11/12/2013  . Irregular heart rhythm 02/22/2013  . OA (osteoarthritis) of knee 04/30/2012  . Skin cancer 02/10/2012  . Hypertension 02/04/2012  . Hypercholesterolemia 02/04/2012  . Hiatal hernia with GERD 02/04/2012  . History of HPV infection 02/04/2012  . Barrett's esophagus 02/04/2012   Everlean Alstrom. Graylon Good, PT, DPT 12/23/18, 11:15 AM  [facility] [address] [place], [address] Phone: [phone]   Fax:  [phone]  Name: DEKLYNN LUECKE MRN: [id number] Date of Birth: March 09, 1945

## 2018-12-30 ENCOUNTER — Other Ambulatory Visit: Payer: Self-pay

## 2018-12-30 ENCOUNTER — Encounter: Payer: Self-pay | Admitting: Physical Therapy

## 2018-12-30 ENCOUNTER — Telehealth: Payer: Self-pay | Admitting: Physical Therapy

## 2018-12-30 ENCOUNTER — Ambulatory Visit: Payer: Medicare Other | Attending: Orthopedic Surgery | Admitting: Physical Therapy

## 2018-12-30 DIAGNOSIS — M25511 Pain in right shoulder: Secondary | ICD-10-CM | POA: Insufficient documentation

## 2018-12-30 DIAGNOSIS — M6281 Muscle weakness (generalized): Secondary | ICD-10-CM | POA: Diagnosis present

## 2018-12-30 DIAGNOSIS — M25611 Stiffness of right shoulder, not elsewhere classified: Secondary | ICD-10-CM | POA: Insufficient documentation

## 2018-12-30 NOTE — Therapy (Signed)
[facility] [address] [place], [address] Phone: [phone]   Fax:  [phone]  Physical Therapy Treatment  Patient Details  Name: Kaitlin Dean MRN: [id number] Date of Birth: Mar 28, 1945 Referring Provider (PT): Alveda Reasons, MD   Encounter Date: 12/30/2018   PT End of Session - 12/30/18 1306    Visit Number  4    Number of Visits  16    Date for PT Re-Evaluation  02/10/19    Authorization Type  [employer] medicare reporting from 12/16/2018    Authorization - Visit Number  4    Authorization - Number of Visits  10    PT Start Time  [id number]    PT Stop Time  1341    PT Time Calculation (min)  38 min    Activity Tolerance  Patient tolerated treatment well    Behavior During Therapy  [facility] for tasks assessed/performed       Past Medical History:  Diagnosis Date  . 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 Barrett's esophagus   . Chicken pox   . Complication of anesthesia    woke up 15 years ago and felt like could not breathe - no complications   . Diverticulosis of colon without hemorrhage 02/28/2014  . GERD (gastroesophageal reflux disease)   . History of fractured kneecap   . History of fractured pelvis   . History of hiatal hernia   . Hypercholesterolemia   . Hypertension   . Osteoarthritis   . Osteoporosis   . Pneumonia    in past  . 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 Shoulder fracture, right 11/07/2018  . Skin cancer     Past Surgical History:  Procedure Laterality Date  . BREAST EXCISIONAL BIOPSY Right 2000   benign  . BREAST SURGERY  2000   Biopsy  . CATARACT EXTRACTION W/PHACO Right 12/05/2016   Procedure: CATARACT EXTRACTION PHACO AND INTRAOCULAR LENS PLACEMENT ([place]) RIGHT SYMFONY LENS;  Surgeon: Leandrew Koyanagi, MD;  Location: [place];  Service: Ophthalmology;  Laterality: Right;  . CATARACT EXTRACTION W/PHACO Left 02/06/2017   Procedure:  CATARACT EXTRACTION PHACO AND INTRAOCULAR LENS PLACEMENT ([place]) LEFT SYMFONY LENS;  Surgeon: Leandrew Koyanagi, MD;  Location: [place];  Service: Ophthalmology;  Laterality: Left;  . COLONOSCOPY    . ESOPHAGOGASTRODUODENOSCOPY    . ESOPHAGOGASTRODUODENOSCOPY (EGD) WITH PROPOFOL N/A 06/11/2016   Procedure: ESOPHAGOGASTRODUODENOSCOPY (EGD) WITH PROPOFOL;  Surgeon: Manya Silvas, MD;  Location: [facility];  Service: Endoscopy;  Laterality: N/A;  . JOINT REPLACEMENT Left    knee  . PTOSIS REPAIR Bilateral 06/01/2014   Procedure: PTOSIS REPAIR;  Surgeon: Karle Starch, MD;  Location: [place];  Service: Ophthalmology;  Laterality: Bilateral;  . SKIN CANCER EXCISION    . TOTAL KNEE ARTHROPLASTY Left 04/30/2012   Procedure: TOTAL KNEE ARTHROPLASTY;  Surgeon: Gearlean Alf, MD;  Location: [facility];  Service: Orthopedics;  Laterality: Left;  . TUBAL LIGATION      There were no vitals filed for this visit.  Subjective Assessment - 12/30/18 1304    Subjective  Patient reports she is feeling good and has been walking around the neighborhood and enjoying getting more exercise. She states her arm is doing well and she has no pain upon arrival. She has not been doing many official exercises but she has been doing a lot of cooking and decorating that has been getting exercises.  Mixing the mashed potatos was the hardest part.    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 osteoporosis    Limitations  Other (comment)   Functional limitations: cannot fix hair or reach top of head in the back, was restricted in eating with R UE, driving, pushing,  seatbelt, reaching, lifting above shoulder, dressing and bathing, gardening, cleaning house, swim backstroke.   Diagnostic tests  radiograph showed fracture of R proximal humerous    Patient Stated Goals  "to get my arms back like they used to be" "would like to be able to swim backstroke"    Currently in Pain?  No/denies    Pain Onset  More than a month ago       OBJECTIVE PROM R shoulder:  Flexion: 130   Abduction: 120   ER 60  IR 55  Shaking at end range.    TREATMENT: Therapeutic exercise:to centralize symptoms and improve ROM, strength, muscular endurance, and activity tolerance required for successful completion of functional activities.  - Upper body ergometerwith no added resistanceencourage joint nutrition, warm tissue, induce analgesic effect of aerobic exercise, improve muscular strength and endurance, and prepare for remainder of session. X 4 minutes during subjective exam. Cuing for technique.     - Standing AAROM with shoulder ranger: flexion, horizontal abduction/adduction x 20 each.  Abduction x10. Cuing for proper scapular mechanics.   - prone scapular retraction, depression, posterior tilt against gravity with B UE externally rotated and hands resting on mat x 20, with added hand lift x 20. With horizontal abduction thumbs up x 20.   Multimodal cuing and monitoring for proper form, appropriate intensity and rest breaks, pain control techniques.    Manual therapy:to reduce pain and tissue tension, improve range of motion, neuromodulation, in order to promote improved ability to complete functional activities..  - supine PROM with overpressure to tolerance flexion, abduction, IR, ER. x10-20 times each direction.   HOME EXERCISE PROGRAM Access Code: [email or web address]  URL: [email or web address]  Date: 12/18/2018  Prepared by: Rosita Kea   Exercises  Seated Shoulder Flexion AAROM with Pulley Behind - 2 sets - 20 reps - 3 seconds hold - 2x  daily - 7x weekly  Seated Shoulder Abduction AAROM with Pulley Behind - 2 sets - 20 reps - 3 seconds hold - 2x daily - 7x weekly  Supine Shoulder Flexion with Dowel - 2 sets - 20 reps - 3 seconds hold - 2x daily - 7x weekly  Supine Shoulder External Rotation with Dowel - 2 sets - 20 reps - 3 seconds hold - 2x daily - 7x weekly    PT Education - 12/30/18 1306    Education Details  Exercise purpose/form. Self management techniques.    Person(s) Educated  Patient    Methods  Explanation;Demonstration;Tactile cues;Verbal cues    Comprehension  Verbalized understanding;Returned demonstration;Verbal cues required;Tactile cues required;Need further instruction       PT Short Term Goals - 12/23/18 1113      PT SHORT TERM GOAL #1   Title  Be independent with initial home exercise program for self-management of symptoms.    Baseline  initial HEP provided at IE (12/16/2018)    Time  2  Period  Weeks    Status  Achieved    Target Date  12/30/18        PT Long Term Goals - 12/16/18 1108      PT LONG TERM GOAL #1   Title  Be independent with a long-term home exercise program for self-management of symptoms    Baseline  Initial HEP provided at IE (12/16/2018);    Time  8    Period  Weeks    Status  New    Target Date  02/10/19      PT LONG TERM GOAL #2   Title  emonstrate improved FOTO score by 10 units to demonstrate improvement in overall condition and self-reported functional ability.    Baseline  FOTO = 55 (12/16/2018);    Time  8    Period  Weeks    Status  New    Target Date  02/10/19      PT LONG TERM GOAL #3   Title  Demonstrate R shoulder AROM equal to or greater than L shoulder AROM with 1/10 or less pain in all planes to allow patient to complete valued activities with less difficulty.    Baseline  see objective measures (12/16/2018);    Time  8    Period  Weeks    Status  New    Target Date  02/10/19      PT LONG TERM GOAL #4   Title  Demonstrate R shoulder MMT  4/5 or greater to improve ability to perform reaching, carrying, and lifting tasks.    Baseline  testing deferred due to precautions (12/16/2018);    Time  8    Period  Weeks    Status  New    Target Date  02/10/19      PT LONG TERM GOAL #5   Title  Complete community, work and/or recreational activities without limitation due to current condition.    Baseline  Functional limitations: cannot fix hair or reach top of head in the back, was restricted in eating with R UE, driving, pushing, seatbelt, reaching, lifting above shoulder, dressing and bathing, gardening, cleaning house, swim backstroke, cooking (12/16/2018);    Time  8    Period  Weeks    Status  New    Target Date  02/10/19            Plan - 12/30/18 1312    Clinical Impression Statement  Patient tolerated treatment well and continues to make progress towards goals. She was able to advance AAROM exercises with the shoulder ranger and continues to demonstrate improved ROM in the shoulder. Patient continues to demonstrate compensatory shoulder hike with wall slides and is limited in functional mobility at home and weight bearing/lfiting restrictions. Pt required multimodal cuing for proper technique and to facilitate improved neuromuscular control, strength, range of motion, and functional ability resulting in improved performance and form. Patient would benefit from continued physical therapy to address remaining impairments and functional limitations to work towards stated goals and return to PLOF or maximal functional independence.    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    Examination-Activity Limitations  Bathing;Lift;Reach Overhead;Carry;Dressing;Hygiene/Grooming;Self Feeding;Other   cannot fix hair or reach top of head in the back, was restricted in eating with R UE, driving, pushing, seatbelt, reaching, lifting above shoulder, dressing and bathing, gardening, cleaning house, swim backstroke, cooking.   Examination-Participation Restrictions  Community Activity;Driving;Laundry;Meal Prep;Yard Work    Stability/Clinical Decision Making  Stable/Uncomplicated    Rehab Potential  Good    PT Frequency  2x / week    PT Duration  8 weeks    PT Treatment/Interventions  ADLs/Self Care Home Management;Aquatic Therapy;Electrical Stimulation;Cryotherapy;Moist Heat;Functional mobility training;Therapeutic activities;Therapeutic exercise;Neuromuscular re-education;Patient/family education;Manual techniques;Passive range of motion;Dry needling;Spinal Manipulations;Joint Manipulations    PT Next Visit Plan  progressive ROM and strengthening as tolerated    PT Home Exercise Plan  Medbridge Access Code: [email or web address]    Consulted and Agree with Plan of Care  Patient       Patient will benefit from skilled therapeutic intervention in order to improve the following deficits and impairments:  Increased fascial restricitons, Pain, Improper body mechanics, Postural dysfunction, Increased muscle spasms, Decreased mobility, Decreased coordination, Decreased activity tolerance, Decreased endurance, Decreased range of motion, Decreased strength, Hypomobility, Impaired perceived functional ability, Impaired UE functional use, Impaired flexibility, Decreased knowledge of precautions  Visit Diagnosis: Acute pain of right shoulder  Stiffness of right shoulder, not elsewhere classified  Muscle weakness (generalized)     Problem List Patient Active Problem List   Diagnosis Date Noted  . Aortic atherosclerosis ([place])  08/28/2016  . Throat irritation 02/20/2015  . Hyperglycemia 09/01/2014  . Health care maintenance 08/07/2014  . Diverticulosis of colon without hemorrhage 02/28/2014  . Breast pain, right 11/12/2013  . Breast thickening 11/12/2013  . Irregular heart rhythm 02/22/2013  . OA (osteoarthritis) of knee 04/30/2012  . Skin cancer 02/10/2012  . Hypertension 02/04/2012  . Hypercholesterolemia 02/04/2012  . Hiatal hernia with GERD 02/04/2012  . History of HPV infection 02/04/2012  . Barrett's esophagus 02/04/2012    Everlean Alstrom. Graylon Good, PT, DPT 12/30/18, 1:45 PM   [facility] [address], [place], [address] Phone: [phone]   Fax:  [phone]  Name: Kaitlin Dean MRN: [id number] Date of Birth: 04-02-1945

## 2018-12-30 NOTE — Telephone Encounter (Signed)
Called patient after she no-showed to her 10:30 appointment scheduled today. No answer. Left voicemail informing patient of missed visit, offering later times today, and reminding her of her next scheduled visit Thursday 01/01/2019 at 9:45 am. Requested a call back to confirm her next appointment and to reschedule today's appointment if she is able. Call back number provided: [place]. Graylon Good, PT, DPT 12/30/18, 10:54 AM

## 2019-01-01 ENCOUNTER — Encounter: Payer: Self-pay | Admitting: Physical Therapy

## 2019-01-01 ENCOUNTER — Other Ambulatory Visit: Payer: Self-pay

## 2019-01-01 ENCOUNTER — Ambulatory Visit: Payer: Medicare Other | Admitting: Physical Therapy

## 2019-01-01 DIAGNOSIS — M25611 Stiffness of right shoulder, not elsewhere classified: Secondary | ICD-10-CM

## 2019-01-01 DIAGNOSIS — M6281 Muscle weakness (generalized): Secondary | ICD-10-CM

## 2019-01-01 DIAGNOSIS — M25511 Pain in right shoulder: Secondary | ICD-10-CM | POA: Diagnosis not present

## 2019-01-01 NOTE — Therapy (Signed)
[facility] [address] [place], [address] Phone: [phone]   Fax:  [phone]  Physical Therapy Treatment  Patient Details  Name: Kaitlin Dean MRN: [id number] Date of Birth: 1945/02/28 Referring Provider (PT): Alveda Reasons, MD   Encounter Date: 01/01/2019  PT End of Session - 01/01/19 1000    Visit Number  5    Number of Visits  16    Date for PT Re-Evaluation  02/10/19    Authorization Type  [employer] medicare reporting from 12/16/2018    Authorization - Visit Number  5    Authorization - Number of Visits  10    PT Start Time  [phone]    PT Stop Time  1030    PT Time Calculation (min)  38 min    Activity Tolerance  Patient tolerated treatment well    Behavior During Therapy  [facility] for tasks assessed/performed       Past Medical History:  Diagnosis Date  . 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 Barrett's esophagus   . Chicken pox   . Complication of anesthesia    woke up 15 years ago and felt like could not breathe - no complications   . Diverticulosis of colon without hemorrhage 02/28/2014  . GERD (gastroesophageal reflux disease)   . History of fractured kneecap   . History of fractured pelvis   . History of hiatal hernia   . Hypercholesterolemia   . Hypertension   . Osteoarthritis   . Osteoporosis   . Pneumonia    in past  . 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 Shoulder fracture, right 11/07/2018  . Skin cancer     Past Surgical History:  Procedure Laterality Date  . BREAST EXCISIONAL BIOPSY Right 2000   benign  . BREAST SURGERY  2000   Biopsy  . CATARACT EXTRACTION W/PHACO Right 12/05/2016   Procedure: CATARACT EXTRACTION PHACO AND INTRAOCULAR LENS PLACEMENT ([place]) RIGHT SYMFONY LENS;  Surgeon: Leandrew Koyanagi, MD;  Location: [place];  Service: Ophthalmology;  Laterality: Right;  . CATARACT EXTRACTION W/PHACO Left 02/06/2017   Procedure:  CATARACT EXTRACTION PHACO AND INTRAOCULAR LENS PLACEMENT ([place]) LEFT SYMFONY LENS;  Surgeon: Leandrew Koyanagi, MD;  Location: [place];  Service: Ophthalmology;  Laterality: Left;  . COLONOSCOPY    . ESOPHAGOGASTRODUODENOSCOPY    . ESOPHAGOGASTRODUODENOSCOPY (EGD) WITH PROPOFOL N/A 06/11/2016   Procedure: ESOPHAGOGASTRODUODENOSCOPY (EGD) WITH PROPOFOL;  Surgeon: Manya Silvas, MD;  Location: [facility];  Service: Endoscopy;  Laterality: N/A;  . JOINT REPLACEMENT Left    knee  . PTOSIS REPAIR Bilateral 06/01/2014   Procedure: PTOSIS REPAIR;  Surgeon: Karle Starch, MD;  Location: [place];  Service: Ophthalmology;  Laterality: Bilateral;  . SKIN CANCER EXCISION    . TOTAL KNEE ARTHROPLASTY Left 04/30/2012   Procedure: TOTAL KNEE ARTHROPLASTY;  Surgeon: Gearlean Alf, MD;  Location: [facility];  Service: Orthopedics;  Laterality: Left;  . TUBAL LIGATION      There were no vitals filed for this visit.  Subjective Assessment - 01/01/19 0955    Subjective  Patient report she is feeling well today. She has no pain but was fairly sore yesterday so she took a break from HEP. She saw her referring physician who was very pleased with the motion she had but warned her to be careful with using it too much. Said no "shoving" christmas items overhead and "let pain  be your guide." Physician took radiographs that showed appropriate healing at this point.    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 osteoporosis    Limitations  Other (comment)   Functional limitations: cannot fix hair or reach top of head in the back, was restricted in eating  with R UE, driving, pushing, seatbelt, reaching, lifting above shoulder, dressing and bathing, gardening, cleaning house, swim backstroke.   Diagnostic tests  radiograph showed fracture of R proximal humerous    Patient Stated Goals  "to get my arms back like they used to be" "would like to be able to swim backstroke"    Currently in Pain?  No/denies    Pain Onset  More than a month ago       OBJECTIVE PROM R shoulder (last measured 12/30/2018):  Flexion: 130   Abduction: 120   ER 60  IR 55  Shaking at end range.    TREATMENT: Therapeutic exercise:to centralize symptoms and improve ROM, strength, muscular endurance, and activity tolerance required for successful completion of functional activities.  -Upper body ergometerwith no added resistanceencourage joint nutrition, warm tissue, induce analgesic effect of aerobic exercise, improve muscular strength and endurance, and prepare for remainder of session.X 5 minutes during subjective exam.    - reclined (25 degrees) AAROM shoulder flexion with PVC stick. X 20  - reclined (25 degrees) AAROM shoulder press with PVC stick. X 20   - prone scapular retraction, depression, posterior tilt against gravity with B UE externally rotated and hands resting on mat x 20, with added hand lift x 20. With horizontal abduction thumbs up x 20.   Multimodal cuing and monitoring for proper form, appropriate intensity and rest breaks, pain control techniques.    Manual therapy:to reduce pain and tissue tension, improve range of motion, neuromodulation, in order to promote improved ability to complete functional activities..  - supine PROM with overpressure to tolerance flexion, abduction, IR, ER. x10-20 times each direction as tolerated.    HOME EXERCISE PROGRAM Access Code: [email or web address]  URL: [email or web address]  Date: 12/18/2018  Prepared by: Rosita Kea   Exercises  Seated Shoulder Flexion AAROM with Pulley Behind  - 2 sets - 20 reps - 3 seconds hold - 2x daily - 7x weekly  Seated Shoulder Abduction AAROM with Pulley Behind - 2 sets - 20 reps - 3 seconds hold - 2x daily - 7x weekly  Supine Shoulder Flexion with Dowel - 2 sets - 20 reps - 3 seconds hold - 2x daily - 7x weekly  Supine Shoulder External Rotation with Dowel - 2 sets - 20 reps - 3 seconds hold - 2x daily - 7x weekly    PT Education - 01/01/19 1000    Education Details  Exercise purpose/form. Self management techniques    Person(s) Educated  Patient    Methods  Explanation;Demonstration;Tactile cues;Verbal cues    Comprehension  Verbalized understanding;Returned demonstration;Verbal cues required;Tactile cues required;Need further instruction       PT Short Term Goals - 12/23/18 1113      PT SHORT TERM GOAL #1   Title  Be independent with initial home exercise program for self-management of symptoms.    Baseline  initial HEP provided at  [facility] (12/16/2018)    Time  2    Period  Weeks    Status  Achieved    Target Date  12/30/18        PT Long Term Goals - 12/16/18 1108      PT LONG TERM GOAL #1   Title  Be independent with a long-term home exercise program for self-management of symptoms    Baseline  Initial HEP provided at [facility] (12/16/2018);    Time  8    Period  Weeks    Status  New    Target Date  02/10/19      PT LONG TERM GOAL #2   Title  emonstrate improved FOTO score by 10 units to demonstrate improvement in overall condition and self-reported functional ability.    Baseline  FOTO = 55 (12/16/2018);    Time  8    Period  Weeks    Status  New    Target Date  02/10/19      PT LONG TERM GOAL #3   Title  Demonstrate R shoulder AROM equal to or greater than L shoulder AROM with 1/10 or less pain in all planes to allow patient to complete valued activities with less difficulty.    Baseline  see objective measures (12/16/2018);    Time  8    Period  Weeks    Status  New    Target Date  02/10/19      PT LONG TERM GOAL  #4   Title  Demonstrate R shoulder MMT 4/5 or greater to improve ability to perform reaching, carrying, and lifting tasks.    Baseline  testing deferred due to precautions (12/16/2018);    Time  8    Period  Weeks    Status  New    Target Date  02/10/19      PT LONG TERM GOAL #5   Title  Complete community, work and/or recreational activities without limitation due to current condition.    Baseline  Functional limitations: cannot fix hair or reach top of head in the back, was restricted in eating with R UE, driving, pushing, seatbelt, reaching, lifting above shoulder, dressing and bathing, gardening, cleaning house, swim backstroke, cooking (12/16/2018);    Time  8    Period  Weeks    Status  New    Target Date  02/10/19            Plan - 01/01/19 1023    Clinical Impression Statement  Patient tolerated treatment well and continues to make progress towards goals. Did not do ranger exercise today due to increased pain following last treatment session. Patient appropriately challenged by scapular and ROM exercises and able to complete all with no lasting increase in pain or discomfort.  Pt required multimodal cuing for proper technique and to facilitate improved neuromuscular control, strength, range of motion, and functional ability resulting in improved performance and form. Patient would benefit from continued physical therapy to address remaining impairments and functional limitations to work towards stated goals and return to PLOF or maximal functional independence.    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  Examination-Activity Limitations  Bathing;Lift;Reach Overhead;Carry;Dressing;Hygiene/Grooming;Self Feeding;Other   cannot fix hair or reach top of head in the back, was restricted in eating with R UE, driving, pushing, seatbelt, reaching, lifting above shoulder, dressing and bathing, gardening, cleaning house, swim backstroke, cooking.   Examination-Participation Restrictions  Community Activity;Driving;Laundry;Meal Prep;Yard Work    Stability/Clinical Decision Making  Stable/Uncomplicated    Rehab Potential  Good    PT Frequency  2x / week    PT Duration  8 weeks    PT Treatment/Interventions  ADLs/Self Care Home Management;Aquatic Therapy;Electrical Stimulation;Cryotherapy;Moist Heat;Functional mobility training;Therapeutic activities;Therapeutic exercise;Neuromuscular re-education;Patient/family education;Manual techniques;Passive range of motion;Dry needling;Spinal Manipulations;Joint Manipulations    PT Next Visit Plan  progressive ROM and strengthening as tolerated    PT Home Exercise Plan  Medbridge Access Code: [email or web address]    Consulted and Agree with Plan of Care  Patient       Patient will benefit from skilled therapeutic intervention in order to improve the following deficits and impairments:  Increased fascial restricitons, Pain, Improper body mechanics, Postural dysfunction, Increased muscle spasms, Decreased mobility, Decreased coordination, Decreased activity tolerance, Decreased endurance, Decreased range of motion, Decreased strength, Hypomobility, Impaired perceived functional ability, Impaired UE functional use, Impaired flexibility, Decreased knowledge of precautions  Visit Diagnosis: Acute pain of right shoulder  Stiffness of right shoulder, not elsewhere classified  Muscle weakness (generalized)     Problem List Patient Active Problem List   Diagnosis Date Noted  . Aortic atherosclerosis ([place]) 08/28/2016  .  Throat irritation 02/20/2015  . Hyperglycemia 09/01/2014  . Health care maintenance 08/07/2014  . Diverticulosis of colon without hemorrhage 02/28/2014  . Breast pain, right 11/12/2013  . Breast thickening 11/12/2013  . Irregular heart rhythm 02/22/2013  . OA (osteoarthritis) of knee 04/30/2012  . Skin cancer 02/10/2012  . Hypertension 02/04/2012  . Hypercholesterolemia 02/04/2012  . Hiatal hernia with GERD 02/04/2012  . History of HPV infection 02/04/2012  . Barrett's esophagus 02/04/2012    Everlean Alstrom. Graylon Good, PT, DPT 01/01/19, 10:40 AM  [facility] [address], [place], [address] Phone: [phone]   Fax:  [phone]  Name: ANMOL DECOOK MRN: [id number] Date of Birth: 03-09-1945

## 2019-01-06 ENCOUNTER — Other Ambulatory Visit: Payer: Self-pay

## 2019-01-06 ENCOUNTER — Encounter: Payer: Self-pay | Admitting: Physical Therapy

## 2019-01-06 ENCOUNTER — Ambulatory Visit: Payer: Medicare Other | Admitting: Physical Therapy

## 2019-01-06 DIAGNOSIS — M25511 Pain in right shoulder: Secondary | ICD-10-CM | POA: Diagnosis not present

## 2019-01-06 DIAGNOSIS — M25611 Stiffness of right shoulder, not elsewhere classified: Secondary | ICD-10-CM

## 2019-01-06 DIAGNOSIS — M6281 Muscle weakness (generalized): Secondary | ICD-10-CM

## 2019-01-06 NOTE — Therapy (Signed)
[facility] [address], [place], [address] Phone: [phone]   Fax:  [phone]  Physical Therapy Treatment  Patient Details  Name: EMMOGENE PARRENT MRN: [id number] Date of Birth: [birth year] Referring Provider (PT): Alveda Reasons, MD   Encounter Date: 01/06/2019  PT End of Session - 01/06/19 1128    Visit Number  6    Number of Visits  16    Date for PT Re-Evaluation  02/10/19    Authorization Type  [employer] medicare reporting from 12/16/2018    Authorization - Visit Number  6    Authorization - Number of Visits  10    PT Start Time  1120    PT Stop Time  1200    PT Time Calculation (min)  40 min    Activity Tolerance  Patient tolerated treatment well    Behavior During Therapy  [facility] for tasks assessed/performed       Past Medical History:  Diagnosis Date  . 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 Barrett's esophagus   . Chicken pox   . Complication of anesthesia    woke up 15 years ago and felt like could not breathe - no complications   . Diverticulosis of colon without hemorrhage 02/28/2014  . GERD (gastroesophageal reflux disease)   . History of fractured kneecap   . History of fractured pelvis   . History of hiatal hernia   . Hypercholesterolemia   . Hypertension   . Osteoarthritis   . Osteoporosis   . Pneumonia    in past  . 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 Shoulder fracture, right 11/07/2018  . Skin cancer     Past Surgical History:  Procedure Laterality Date  . BREAST EXCISIONAL BIOPSY Right 2000   benign  . BREAST SURGERY  2000   Biopsy  . CATARACT EXTRACTION W/PHACO Right 12/05/2016   Procedure: CATARACT EXTRACTION PHACO AND INTRAOCULAR LENS PLACEMENT ([place]) RIGHT SYMFONY LENS;  Surgeon: Leandrew Koyanagi, MD;  Location: [facility];  Service: Ophthalmology;  Laterality: Right;  . CATARACT EXTRACTION W/PHACO Left 02/06/2017   Procedure:  CATARACT EXTRACTION PHACO AND INTRAOCULAR LENS PLACEMENT ([place]) LEFT SYMFONY LENS;  Surgeon: Leandrew Koyanagi, MD;  Location: [facility];  Service: Ophthalmology;  Laterality: Left;  . COLONOSCOPY    . ESOPHAGOGASTRODUODENOSCOPY    . ESOPHAGOGASTRODUODENOSCOPY (EGD) WITH PROPOFOL N/A 06/11/2016   Procedure: ESOPHAGOGASTRODUODENOSCOPY (EGD) WITH PROPOFOL;  Surgeon: Manya Silvas, MD;  Location: [facility] END[facility]COPY;  Service: Endoscopy;  Laterality: N/A;  . JOINT REPLACEMENT Left    knee  . PTOSIS REPAIR Bilateral 06/01/2014   Procedure: PTOSIS REPAIR;  Surgeon: Karle Starch, MD;  Location: [facility];  Service: Ophthalmology;  Laterality: Bilateral;  . SKIN CANCER EXCISION    . TOTAL KNEE ARTHROPLASTY Left 04/30/2012   Procedure: TOTAL KNEE ARTHROPLASTY;  Surgeon: Gearlean Alf, MD;  Location: [facility];  Service: Orthopedics;  Laterality: Left;  . TUBAL LIGATION      There were no vitals filed for this visit.  Subjective Assessment - 01/06/19 1126    Subjective  Patinet reports she is sore in the R arm after baking all day yesterday. She did not do much of her exercises after baking because she was sore. Has 3/10 soreness in the R arm today from yesterday. She is going out of town next week, so she will not be able to come to PT that  week. She did not have any increased pain following last session but did have some muscle soreness in her scapular region after the exercise last session.    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 osteoporosis    Limitations  Other (comment)   Functional limitations: cannot fix hair or reach top of head in  the back, was restricted in eating with R UE, driving, pushing, seatbelt, reaching, lifting above shoulder, dressing and bathing, gardening, cleaning house, swim backstroke.   Diagnostic tests  radiograph showed fracture of R proximal humerous    Patient Stated Goals  "to get my arms back like they used to be" "would like to be able to swim backstroke"    Currently in Pain?  Yes    Pain Score  3     Pain Onset  More than a month ago       OBJECTIVE PROM R shoulder (last measured 01/06/2019):  Flexion: 135   Abduction: 125   ER 48  IR 70  Pain at end range   TREATMENT: Therapeutic exercise:to centralize symptoms and improve ROM, strength, muscular endurance, and activity tolerance required for successful completion of functional activities.  -Upper body ergometerwith no added resistanceencourage joint nutrition, warm tissue, induce analgesic effect of aerobic exercise, improve muscular strength and endurance, and prepare for remainder of session.X5 minutes during subjective exam.   - reclined (35 degrees) AAROM shoulder flexion with PVC stick. X 20  - reclined (55 degrees) AAROM shoulder press with PVC stick. X 20   - prone scapular retraction, depression, posterior tilt against gravity with B UE externally rotated and hands resting on mat x 20, with added hand lift x 20. With horizontal abduction thumbs up x 20.   Multimodal cuing and monitoring for proper form, appropriate intensity and rest breaks, pain control techniques.  Manual therapy:to reduce pain and tissue tension, improve range of motion, neuromodulation, in order to promote improved ability to complete functional activities..  - supine PROM with overpressure to tolerance flexion, abduction, IR, ER. x10-20times each direction as tolerated.    HOME EXERCISE PROGRAM Access Code: [email or web address]  URL: [email or web address]  Date: 12/18/2018  Prepared by: Rosita Kea   Exercises   Seated Shoulder Flexion AAROM with Pulley Behind - 2 sets - 20 reps - 3 seconds hold - 2x daily - 7x weekly  Seated Shoulder Abduction AAROM with Pulley Behind - 2 sets - 20 reps - 3 seconds hold - 2x daily - 7x weekly  Supine Shoulder Flexion with Dowel - 2 sets - 20 reps - 3 seconds hold - 2x daily - 7x weekly  Supine Shoulder External Rotation with Dowel - 2 sets - 20 reps - 3 seconds hold - 2x daily - 7x weekly  PT Education - 01/06/19 1128    Education Details  Exercise purpose/form. Self management techniques    Person(s) Educated  Patient    Methods  Explanation;Demonstration;Tactile cues;Verbal cues    Comprehension  Verbalized understanding;Returned demonstration;Verbal cues required;Need further instruction       PT Short Term Goals - 12/23/18 1113      PT SHORT TERM GOAL #1   Title  Be independent with initial home exercise program for self-management  of symptoms.    Baseline  initial HEP provided at IE (12/16/2018)    Time  2    Period  Weeks    Status  Achieved    Target Date  12/30/18        PT Long Term Goals - 12/16/18 1108      PT LONG TERM GOAL #1   Title  Be independent with a long-term home exercise program for self-management of symptoms    Baseline  Initial HEP provided at IE (12/16/2018);    Time  8    Period  Weeks    Status  New    Target Date  02/10/19      PT LONG TERM GOAL #2   Title  emonstrate improved FOTO score by 10 units to demonstrate improvement in overall condition and self-reported functional ability.    Baseline  FOTO = 55 (12/16/2018);    Time  8    Period  Weeks    Status  New    Target Date  02/10/19      PT LONG TERM GOAL #3   Title  Demonstrate R shoulder AROM equal to or greater than L shoulder AROM with 1/10 or less pain in all planes to allow patient to complete valued activities with less difficulty.    Baseline  see objective measures (12/16/2018);    Time  8    Period  Weeks    Status  New    Target Date  02/10/19       PT LONG TERM GOAL #4   Title  Demonstrate R shoulder MMT 4/5 or greater to improve ability to perform reaching, carrying, and lifting tasks.    Baseline  testing deferred due to precautions (12/16/2018);    Time  8    Period  Weeks    Status  New    Target Date  02/10/19      PT LONG TERM GOAL #5   Title  Complete community, work and/or recreational activities without limitation due to current condition.    Baseline  Functional limitations: cannot fix hair or reach top of head in the back, was restricted in eating with R UE, driving, pushing, seatbelt, reaching, lifting above shoulder, dressing and bathing, gardening, cleaning house, swim backstroke, cooking (12/16/2018);    Time  8    Period  Weeks    Status  New    Target Date  02/10/19            Plan - 01/06/19 1152    Clinical Impression Statement  Patient tolerated treatment well and continues to make good progress towards goals at this point. She demonstrated improvement in PROM flexion of the R shoulder to 135 degrees. She continues to have end range discomfort that resolves with rest, and reports limitations in functional endurance such as baking. Patient would benefit from continued physical therapy to address remaining impairments and functional limitations to work towards stated goals and return to PLOF or maximal functional independence.    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    Examination-Activity Limitations  Bathing;Lift;Reach Overhead;Carry;Dressing;Hygiene/Grooming;Self  Feeding;Other   cannot fix hair  or reach top of head in the back, was restricted in eating with R UE, driving, pushing, seatbelt, reaching, lifting above shoulder, dressing and bathing, gardening, cleaning house, swim backstroke, cooking.   Examination-Participation Restrictions  Community Activity;Driving;Laundry;Meal Prep;Yard Work    Stability/Clinical Decision Making  Stable/Uncomplicated    Rehab Potential  Good    PT Frequency  2x / week    PT Duration  8 weeks    PT Treatment/Interventions  ADLs/Self Care Home Management;Aquatic Therapy;Electrical Stimulation;Cryotherapy;Moist Heat;Functional mobility training;Therapeutic activities;Therapeutic exercise;Neuromuscular re-education;Patient/family education;Manual techniques;Passive range of motion;Dry needling;Spinal Manipulations;Joint Manipulations    PT Next Visit Plan  progressive ROM and strengthening as tolerated    PT Home Exercise Plan  Medbridge Access Code: [email or web address]    Consulted and Agree with Plan of Care  Patient       Patient will benefit from skilled therapeutic intervention in order to improve the following deficits and impairments:  Increased fascial restricitons, Pain, Improper body mechanics, Postural dysfunction, Increased muscle spasms, Decreased mobility, Decreased coordination, Decreased activity tolerance, Decreased endurance, Decreased range of motion, Decreased strength, Hypomobility, Impaired perceived functional ability, Impaired UE functional use, Impaired flexibility, Decreased knowledge of precautions  Visit Diagnosis: Acute pain of right shoulder  Stiffness of right shoulder, not elsewhere classified  Muscle weakness (generalized)     Problem List Patient Active Problem List   Diagnosis Date Noted  . Aortic atherosclerosis ([place]) 08/28/2016  . Throat irritation 02/20/2015  . Hyperglycemia 09/01/2014  . Health care maintenance 08/07/2014  . Diverticulosis of colon without hemorrhage 02/28/2014  .  Breast pain, right 11/12/2013  . Breast thickening 11/12/2013  . Irregular heart rhythm 02/22/2013  . OA (osteoarthritis) of knee 04/30/2012  . Skin cancer 02/10/2012  . Hypertension 02/04/2012  . Hypercholesterolemia 02/04/2012  . Hiatal hernia with GERD 02/04/2012  . History of HPV infection 02/04/2012  . Barrett's esophagus 02/04/2012    Everlean Alstrom. Graylon Good, PT, DPT 01/06/19, 11:58 AM  [facility] [address] [place], [address] Phone: [phone]   Fax:  [phone]  Name: MELVENA SPORLEDER MRN: [id number] Date of Birth: [birth year]

## 2019-01-08 ENCOUNTER — Other Ambulatory Visit: Payer: Self-pay

## 2019-01-08 ENCOUNTER — Encounter: Payer: Self-pay | Admitting: Physical Therapy

## 2019-01-08 ENCOUNTER — Ambulatory Visit: Payer: Medicare Other | Admitting: Physical Therapy

## 2019-01-08 DIAGNOSIS — M25611 Stiffness of right shoulder, not elsewhere classified: Secondary | ICD-10-CM

## 2019-01-08 DIAGNOSIS — M6281 Muscle weakness (generalized): Secondary | ICD-10-CM

## 2019-01-08 DIAGNOSIS — M25511 Pain in right shoulder: Secondary | ICD-10-CM

## 2019-01-08 NOTE — Therapy (Signed)
[facility] [address], [place], [address] Phone: [phone]   Fax:  [phone]  Physical Therapy Treatment  Patient Details  Name: [name] MRN: [id number] Date of Birth: 1945-08-24 Referring Provider (PT): Alveda Reasons, MD   Encounter Date: 01/08/2019  PT End of Session - 01/08/19 1114    Visit Number  7    Number of Visits  16    Date for PT Re-Evaluation  02/10/19    Authorization Type  [employer] medicare reporting from 12/16/2018    Authorization - Visit Number  7    Authorization - Number of Visits  10    PT Start Time  1038    PT Stop Time  1120    PT Time Calculation (min)  42 min    Activity Tolerance  Patient tolerated treatment well    Behavior During Therapy  [facility] for tasks assessed/performed       Past Medical History:  Diagnosis Date  . 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 Barrett's esophagus   . Chicken pox   . Complication of anesthesia    woke up 15 years ago and felt like could not breathe - no complications   . Diverticulosis of colon without hemorrhage 02/28/2014  . GERD (gastroesophageal reflux disease)   . History of fractured kneecap   . History of fractured pelvis   . History of hiatal hernia   . Hypercholesterolemia   . Hypertension   . Osteoarthritis   . Osteoporosis   . Pneumonia    in past  . 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 Shoulder fracture, right 11/07/2018  . Skin cancer     Past Surgical History:  Procedure Laterality Date  . BREAST EXCISIONAL BIOPSY Right 2000   benign  . BREAST SURGERY  2000   Biopsy  . CATARACT EXTRACTION W/PHACO Right 12/05/2016   Procedure: CATARACT EXTRACTION PHACO AND INTRAOCULAR LENS PLACEMENT ([place]) RIGHT SYMFONY LENS;  Surgeon: Leandrew Koyanagi, MD;  Location: [place];  Service: Ophthalmology;  Laterality: Right;  . CATARACT EXTRACTION W/PHACO Left 02/06/2017   Procedure:  CATARACT EXTRACTION PHACO AND INTRAOCULAR LENS PLACEMENT ([place]) LEFT SYMFONY LENS;  Surgeon: Leandrew Koyanagi, MD;  Location: [place];  Service: Ophthalmology;  Laterality: Left;  . COLONOSCOPY    . ESOPHAGOGASTRODUODENOSCOPY    . ESOPHAGOGASTRODUODENOSCOPY (EGD) WITH PROPOFOL N/A 06/11/2016   Procedure: ESOPHAGOGASTRODUODENOSCOPY (EGD) WITH PROPOFOL;  Surgeon: Manya Silvas, MD;  Location[facility];  Service: Endoscopy;  Laterality: N/A;  . JOINT REPLACEMENT Left    knee  . PTOSIS REPAIR Bilateral 06/01/2014   Procedure: PTOSIS REPAIR;  Surgeon: Karle Starch, MD;  Location: [place];  Service: Ophthalmology;  Laterality: Bilateral;  . SKIN CANCER EXCISION    . TOTAL KNEE ARTHROPLASTY Left 04/30/2012   Procedure: TOTAL KNEE ARTHROPLASTY;  Surgeon: Gearlean Alf, MD;  Location: [facility];  Service: Orthopedics;  Laterality: Left;  . TUBAL LIGATION      There were no vitals filed for this visit.  Subjective Assessment - 01/08/19 1039    Subjective  Patient reports she is feeling well today. She states her arm was sore yesterday after driving a long ways to see her family member. States she felt okay following last treatment session. Reports no pain upon arrival today.    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 osteoporosis    Limitations  Other (comment)   Functional limitations: cannot fix hair or reach top of head in the back, was restricted in eating with R UE, driving, pushing, seatbelt, reaching, lifting above shoulder, dressing and bathing, gardening, cleaning house, swim backstroke.   Diagnostic tests  radiograph  showed fracture of R proximal humerous    Patient Stated Goals  "to get my arms back like they used to be" "would like to be able to swim backstroke"    Currently in Pain?  No/denies    Pain Onset  More than a month ago       OBJECTIVE PROM R shoulder(last measured 01/08/2019):  Flexion: 127   Abduction: 100   ER   IR  Pain at end range   TREATMENT: Therapeutic exercise:to centralize symptoms and improve ROM, strength, muscular endurance, and activity tolerance required for successful completion of functional activities.  -Seated AAROM R shoulder with pulleys, x2 min each scaption, abduction, and flexion.  - standing R shoulder AAROM  extension with PVC stick, x20 - standing R shoulder AAROM IR with PVC stick, x20 - Education on HEP including handout - reclined (35 degrees) AAROM shoulder flexion with PVC stick. X 20  - reclined (35 degrees) AAROM shoulder press with PVC stick. X 20  - prone scapular retraction, depression, posterior tilt against gravity with B UE externally rotated and hands resting on mat x 20, with added hand lift x 20. With horizontal abduction thumbs up x 20.    Multimodal cuing and monitoring for proper form, appropriate intensity and rest breaks, pain control techniques.  Manual therapy:to reduce pain and tissue tension, improve range of motion, neuromodulation, in order to promote improved ability to complete functional activities..  - supine PROM with overpressure to tolerance flexion, abduction, IR, ER. x 20times each directionas tolerated.  HOME EXERCISE PROGRAM Access Code: [email or web address]  URL: [email or web address]  Date: 01/08/2019  Prepared by: Rosita Kea   Exercises Seated Shoulder Flexion AAROM with Pulley Behind - 1 sets - 20 reps - 3 seconds hold - 1x daily - 7x weekly Seated Shoulder Abduction AAROM with Pulley Behind - 1 sets - 20 reps - 3 seconds hold - 1x daily - 7x weekly Supine Shoulder Flexion with  Dowel - 1 sets - 20 reps - 3 seconds hold - 1x daily - 7x weekly Supine Shoulder External Rotation with Dowel - 1 sets - 20 reps - 3 seconds hold - 1x daily - 7x weekly Standing Shoulder Extension AAROM with Dowel - 1 sets - 20 reps - 3 seconds hold - 1x daily - 7x weekly Standing Shoulder Internal Rotation AAROM with Dowel - 1 sets - 20 reps - 3 seconds hold - 1x daily - 7x weekly Prone Scapular Retraction - 1 sets - 20 reps - 3 seconds hold - 1x daily - 7x weekly    PT Education - 01/08/19 1111    Education Details  Exercise purpose/form. Self management techniques. HEP updated and reveiw    Person(s) Educated  Patient    Methods  Explanation;Demonstration;Tactile cues;Verbal cues;Handout    Comprehension  Verbalized understanding;Returned demonstration;Verbal cues required;Tactile cues required;Need further instruction       PT Short Term Goals - 12/23/18 1113  PT SHORT TERM GOAL #1   Title  Be independent with initial home exercise program for self-management of symptoms.    Baseline  initial HEP provided at IE (12/16/2018)    Time  2    Period  Weeks    Status  Achieved    Target Date  12/30/18        PT Long Term Goals - 12/16/18 1108      PT LONG TERM GOAL #1   Title  Be independent with a long-term home exercise program for self-management of symptoms    Baseline  Initial HEP provided at IE (12/16/2018);    Time  8    Period  Weeks    Status  New    Target Date  02/10/19      PT LONG TERM GOAL #2   Title  emonstrate improved FOTO score by 10 units to demonstrate improvement in overall condition and self-reported functional ability.    Baseline  FOTO = 55 (12/16/2018);    Time  8    Period  Weeks    Status  New    Target Date  02/10/19      PT LONG TERM GOAL #3   Title  Demonstrate R shoulder AROM equal to or greater than L shoulder AROM with 1/10 or less pain in all planes to allow patient to complete valued activities with less difficulty.    Baseline   see objective measures (12/16/2018);    Time  8    Period  Weeks    Status  New    Target Date  02/10/19      PT LONG TERM GOAL #4   Title  Demonstrate R shoulder MMT 4/5 or greater to improve ability to perform reaching, carrying, and lifting tasks.    Baseline  testing deferred due to precautions (12/16/2018);    Time  8    Period  Weeks    Status  New    Target Date  02/10/19      PT LONG TERM GOAL #5   Title  Complete community, work and/or recreational activities without limitation due to current condition.    Baseline  Functional limitations: cannot fix hair or reach top of head in the back, was restricted in eating with R UE, driving, pushing, seatbelt, reaching, lifting above shoulder, dressing and bathing, gardening, cleaning house, swim backstroke, cooking (12/16/2018);    Time  8    Period  Weeks    Status  New    Target Date  02/10/19            Plan - 01/08/19 1110    Clinical Impression Statement  Patient tolerated treatment well overall and continues to make progress towards goals. Patient will be out of town next week, so special attention was given to updating and reviewing HEP so patient can continue her care with self-management next week. Patient agreed and understood new exercises. Patient continues to be limited by precautions and end range discomfort. Patient would benefit from continued physical therapy to address remaining impairments and functional limitations to work towards stated goals and return to PLOF or maximal functional independence.    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    Examination-Activity Limitations  Bathing;Lift;Reach Overhead;Carry;Dressing;Hygiene/Grooming;Self Feeding;Other   cannot fix hair or reach top of head in the back, was restricted in eating with R UE, driving, pushing, seatbelt, reaching, lifting above shoulder, dressing and bathing, gardening, cleaning house, swim backstroke, cooking.   Examination-Participation Restrictions  Community Activity;Driving;Laundry;Meal Prep;Yard Work    Stability/Clinical Decision Making  Stable/Uncomplicated    Rehab Potential  Good    PT Frequency  2x / week    PT Duration  8 weeks    PT Treatment/Interventions  ADLs/Self Care Home Management;Aquatic Therapy;Electrical Stimulation;Cryotherapy;Moist Heat;Functional mobility training;Therapeutic activities;Therapeutic exercise;Neuromuscular re-education;Patient/family education;Manual techniques;Passive range of motion;Dry needling;Spinal Manipulations;Joint Manipulations    PT Next Visit Plan  progressive ROM and strengthening as tolerated    PT Home Exercise Plan  Medbridge Access Code: [email or web address]    Consulted and Agree with Plan of Care  Patient       Patient will benefit from skilled therapeutic intervention in order to improve the following deficits and impairments:  Increased fascial restricitons, Pain, Improper body mechanics, Postural dysfunction, Increased muscle spasms, Decreased mobility, Decreased coordination, Decreased activity tolerance, Decreased endurance, Decreased range of motion, Decreased strength, Hypomobility, Impaired perceived functional ability, Impaired UE functional use, Impaired flexibility, Decreased knowledge of precautions  Visit Diagnosis: Acute pain of right shoulder  Stiffness of right shoulder, not elsewhere classified  Muscle weakness (generalized)     Problem List Patient Active Problem List   Diagnosis Date Noted  . Aortic atherosclerosis ([place]) 08/28/2016  .  Throat irritation 02/20/2015  . Hyperglycemia 09/01/2014  . Health care maintenance 08/07/2014  . Diverticulosis of colon without hemorrhage 02/28/2014  . Breast pain, right 11/12/2013  . Breast thickening 11/12/2013  . Irregular heart rhythm 02/22/2013  . OA (osteoarthritis) of knee 04/30/2012  . Skin cancer 02/10/2012  . Hypertension 02/04/2012  . Hypercholesterolemia 02/04/2012  . Hiatal hernia with GERD 02/04/2012  . History of HPV infection 02/04/2012  . Barrett's esophagus 02/04/2012    Everlean Alstrom. Graylon Good, PT, DPT 01/08/19, 11:15 AM  [facility] [address], [place], [address] Phone: [phone]   Fax:  [phone]  Name: Kaitlin Dean MRN: [id number] Date of Birth: 03-12-1945

## 2019-01-13 ENCOUNTER — Encounter: Payer: Medicare Other | Admitting: Physical Therapy

## 2019-01-15 ENCOUNTER — Encounter: Payer: Medicare Other | Admitting: Physical Therapy

## 2019-01-20 ENCOUNTER — Encounter: Payer: Self-pay | Admitting: Physical Therapy

## 2019-01-20 ENCOUNTER — Ambulatory Visit: Payer: Medicare Other | Admitting: Physical Therapy

## 2019-01-20 ENCOUNTER — Other Ambulatory Visit: Payer: Self-pay

## 2019-01-20 DIAGNOSIS — M6281 Muscle weakness (generalized): Secondary | ICD-10-CM

## 2019-01-20 DIAGNOSIS — M25611 Stiffness of right shoulder, not elsewhere classified: Secondary | ICD-10-CM

## 2019-01-20 DIAGNOSIS — M25511 Pain in right shoulder: Secondary | ICD-10-CM | POA: Diagnosis not present

## 2019-01-20 NOTE — Therapy (Signed)
[facility] [address] [place], [address] Phone: [phone]   Fax:  [phone]  Physical Therapy Treatment  Patient Details  Name: Kaitlin Dean MRN: [id number] Date of Birth: 1945-05-28 Referring Provider (PT): Alveda Reasons, MD   Encounter Date: 01/20/2019  PT End of Session - 01/20/19 1042    Visit Number  9    Number of Visits  16    Date for PT Re-Evaluation  02/10/19    Authorization Type  [employer] medicare reporting from 12/16/2018    Authorization - Visit Number  9    Authorization - Number of Visits  10    PT Start Time  [id number]    PT Stop Time  1115    PT Time Calculation (min)  40 min    Activity Tolerance  Patient tolerated treatment well    Behavior During Therapy  [facility] for tasks assessed/performed       Past Medical History:  Diagnosis Date  . 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 Barrett's esophagus   . Chicken pox   . Complication of anesthesia    woke up 15 years ago and felt like could not breathe - no complications   . Diverticulosis of colon without hemorrhage 02/28/2014  . GERD (gastroesophageal reflux disease)   . History of fractured kneecap   . History of fractured pelvis   . History of hiatal hernia   . Hypercholesterolemia   . Hypertension   . Osteoarthritis   . Osteoporosis   . Pneumonia    in past  . 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 Shoulder fracture, right 11/07/2018  . Skin cancer     Past Surgical History:  Procedure Laterality Date  . BREAST EXCISIONAL BIOPSY Right 2000   benign  . BREAST SURGERY  2000   Biopsy  . CATARACT EXTRACTION W/PHACO Right 12/05/2016   Procedure: CATARACT EXTRACTION PHACO AND INTRAOCULAR LENS PLACEMENT ([place]) RIGHT SYMFONY LENS;  Surgeon: Leandrew Koyanagi, MD;  Location: [place];  Service: Ophthalmology;  Laterality: Right;  . CATARACT EXTRACTION W/PHACO Left 02/06/2017   Procedure:  CATARACT EXTRACTION PHACO AND INTRAOCULAR LENS PLACEMENT ([place]) LEFT SYMFONY LENS;  Surgeon: Leandrew Koyanagi, MD;  Location: [place];  Service: Ophthalmology;  Laterality: Left;  . COLONOSCOPY    . ESOPHAGOGASTRODUODENOSCOPY    . ESOPHAGOGASTRODUODENOSCOPY (EGD) WITH PROPOFOL N/A 06/11/2016   Procedure: ESOPHAGOGASTRODUODENOSCOPY (EGD) WITH PROPOFOL;  Surgeon: Manya Silvas, MD;  Location: [facility] END[facility]COPY;  Service: Endoscopy;  Laterality: N/A;  . JOINT REPLACEMENT Left    knee  . PTOSIS REPAIR Bilateral 06/01/2014   Procedure: PTOSIS REPAIR;  Surgeon: Karle Starch, MD;  Location: [place];  Service: Ophthalmology;  Laterality: Bilateral;  . SKIN CANCER EXCISION    . TOTAL KNEE ARTHROPLASTY Left 04/30/2012   Procedure: TOTAL KNEE ARTHROPLASTY;  Surgeon: Gearlean Alf, MD;  Location: [facility];  Service: Orthopedics;  Laterality: Left;  . TUBAL LIGATION      There were no vitals filed for this visit.  Subjective Assessment - 01/20/19 1038    Subjective  Patient reports she is feeling well and had a great trip. She reports no pain upon arrival. She states that twice while she was trying to take off her jacket she had "nerve pain" that was "really sharp" so after that she has been really careful when she takes off her coat and it doesn't happen. Her  freind had a lot of energy and the patient did a lot of walking with her and all the activities she did including the light show. She continues to feel like she has to be extra careful with her R arm and cannot reach up as high as the L. She avoided golfing because she was afraid to. She was able to do her HEP while away.    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 osteoporosis    Limitations  Other (comment)   Functional limitations: cannot fix hair or reach top of head in the back, was restricted in eating with R UE, driving, pushing, seatbelt, reaching, lifting above shoulder, dressing and bathing, gardening, cleaning house, swim backstroke.   Diagnostic tests  radiograph showed fracture of R proximal humerous    Patient Stated Goals  "to get my arms back like they used to be" "would like to be able to swim backstroke"    Currently in Pain?  No/denies    Pain Onset  More than a month ago       OBJECTIVE PROM R shoulder(last measured 01/20/2019):  Flexion: 127  Abduction: 102  ER  IR Pain at end range   TREATMENT: Therapeutic exercise:to centralize symptoms and improve ROM, strength, muscular endurance, and activity tolerance required for successful completion of functional activities.  - Upper body ergometer with no added resistance encourage joint nutrition, warm tissue, induce analgesic effect of aerobic exercise, improve muscular strength and endurance,  and prepare for remainder of session. 5 min during subjective exam.  - supine AAROM flexion with PVC stick x 20 - seated/standing AAROM flexion with PVC stick 2x10 - standing R shoulder AAROM  extension with PVC stick, x20  - standing R shoulder AAROM IR with PVC stick, x20 - prone scapular retraction, depression, posterior tilt against gravity with B UE externally rotated with added hand lift 2x10. With lift to horizontal abduction cranial x 10.    Multimodal cuing and monitoring for proper form, appropriate intensity and rest breaks, pain control techniques.  Manual therapy:to reduce pain and tissue tension, improve range of motion, neuromodulation, in order to promote improved ability to complete functional activities..  - supine PROM with overpressure to tolerance flexion,  abduction, IR, ER. x 20times each directionas tolerated.  HOME EXERCISE PROGRAM Access Code: [email or web address]        URL: [email or web address]    Date: 01/08/2019  Prepared by: Rosita Kea   Exercises Seated Shoulder Flexion AAROM with Pulley Behind - 1 sets - 20 reps - 3 seconds hold - 1x daily - 7x weekly Seated Shoulder Abduction AAROM with Pulley Behind - 1 sets - 20 reps - 3 seconds hold - 1x daily - 7x weekly Supine Shoulder Flexion with Dowel - 1 sets - 20 reps - 3 seconds hold - 1x daily - 7x weekly Supine Shoulder External Rotation with Dowel - 1 sets - 20 reps - 3 seconds hold - 1x daily - 7x weekly Standing Shoulder Extension AAROM with Dowel - 1 sets - 20 reps - 3 seconds hold - 1x daily - 7x weekly Standing Shoulder Internal Rotation AAROM with Dowel - 1 sets - 20 reps - 3 seconds hold - 1x  daily - 7x weekly Prone Scapular Retraction - 1 sets - 20 reps - 3 seconds hold - 1x daily - 7x weekly    PT Education - 01/20/19 1042    Education Details  Exercise purpose/form. Self management techniques.    Person(s) Educated  Patient    Methods  Explanation;Tactile cues;Demonstration;Verbal cues    Comprehension  Verbalized understanding;Returned demonstration;Verbal cues required;Tactile cues required;Need further instruction       PT Short Term Goals - 12/23/18 1113      PT SHORT TERM GOAL #1   Title  Be independent with initial home exercise program for self-management of symptoms.    Baseline  initial HEP provided at [facility] (12/16/2018)    Time  2    Period  Weeks    Status  Achieved    Target Date  12/30/18        PT Long Term Goals - 12/16/18 1108      PT LONG TERM GOAL #1   Title  Be independent with a long-term home exercise program for self-management of symptoms    Baseline  Initial HEP provided at [facility] (12/16/2018);    Time  8    Period  Weeks    Status  New    Target Date  02/10/19      PT LONG TERM GOAL #2   Title  emonstrate improved FOTO  score by 10 units to demonstrate improvement in overall condition and self-reported functional ability.    Baseline  FOTO = 55 (12/16/2018);    Time  8    Period  Weeks    Status  New    Target Date  02/10/19      PT LONG TERM GOAL #3   Title  Demonstrate R shoulder AROM equal to or greater than L shoulder AROM with 1/10 or less pain in all planes to allow patient to complete valued activities with less difficulty.    Baseline  see objective measures (12/16/2018);    Time  8    Period  Weeks    Status  New    Target Date  02/10/19      PT LONG TERM GOAL #4   Title  Demonstrate R shoulder MMT 4/5 or greater to improve ability to perform reaching, carrying, and lifting tasks.    Baseline  testing deferred due to precautions (12/16/2018);    Time  8    Period  Weeks    Status  New    Target Date  02/10/19      PT LONG TERM GOAL #5   Title  Complete community, work and/or recreational activities without limitation due to current condition.    Baseline  Functional limitations: cannot fix hair or reach top of head in the back, was restricted in eating with R UE, driving, pushing, seatbelt, reaching, lifting above shoulder, dressing and bathing, gardening, cleaning house, swim backstroke, cooking (12/16/2018);    Time  8    Period  Weeks    Status  New    Target Date  02/10/19            Plan - 01/20/19 1108    Clinical Impression Statement  Patient tolerated treatment well and does not appear to have lost any progress while away from physical therapy last week. She continues to have similar PROM and was able to slightly progress AAROM flexion strengthening to an upright position against gravity. continues to be limited by pain, stiffness, weakness, and decreased activity tolerance  that restricts her functional activities including reaching, carrying, golfing, social participation. Patient would benefit from continued physical therapy to address remaining impairments and functional  limitations to work towards stated goals and return to PLOF or maximal functional independence.    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    Examination-Activity Limitations  Bathing;Lift;Reach Overhead;Carry;Dressing;Hygiene/Grooming;Self Feeding;Other   cannot fix hair or reach top of head in the back, was restricted in eating with R UE, driving, pushing, seatbelt, reaching, lifting above shoulder, dressing and bathing, gardening, cleaning house, swim backstroke, cooking.   Examination-Participation Restrictions  Community Activity;Driving;Laundry;Meal Prep;Yard Work    Stability/Clinical Decision Making  Stable/Uncomplicated    Rehab Potential  Good    PT Frequency  2x / week    PT Duration  8 weeks    PT Treatment/Interventions  ADLs/Self Care Home Management;Aquatic Therapy;Electrical Stimulation;Cryotherapy;Moist Heat;Functional mobility training;Therapeutic activities;Therapeutic exercise;Neuromuscular re-education;Patient/family education;Manual techniques;Passive range of motion;Dry needling;Spinal Manipulations;Joint Manipulations    PT Next Visit Plan  progressive ROM and strengthening as tolerated    PT Home Exercise Plan  Medbridge Access Code: [email or web address]    Consulted and Agree with Plan of Care  Patient       Patient will benefit from skilled therapeutic intervention in order to improve the following deficits and impairments:  Increased fascial restricitons, Pain, Improper body mechanics, Postural dysfunction, Increased muscle spasms, Decreased mobility,  Decreased coordination, Decreased activity tolerance, Decreased endurance, Decreased range of motion, Decreased strength, Hypomobility, Impaired perceived functional ability, Impaired UE functional use, Impaired flexibility, Decreased knowledge of precautions  Visit Diagnosis: Acute pain of right shoulder  Stiffness of right shoulder, not elsewhere classified  Muscle weakness (generalized)     Problem List Patient Active Problem List   Diagnosis Date Noted  . Aortic atherosclerosis ([place]) 08/28/2016  . Throat irritation 02/20/2015  . Hyperglycemia 09/01/2014  . Health care maintenance 08/07/2014  . Diverticulosis of colon without hemorrhage 02/28/2014  . Breast pain, right 11/12/2013  . Breast thickening 11/12/2013  . Irregular heart rhythm 02/22/2013  . OA (osteoarthritis) of knee 04/30/2012  . Skin cancer 02/10/2012  . Hypertension 02/04/2012  . Hypercholesterolemia 02/04/2012  . Hiatal hernia with GERD 02/04/2012  . History of HPV infection 02/04/2012  . Barrett's esophagus 02/04/2012    Everlean Alstrom. Graylon Good, PT, DPT 01/20/19, 11:20 AM  [facility] [address] [place], [address] Phone: [phone]   Fax:  [phone]  Name: Kaitlin Dean MRN: [id number] Date of Birth: 1945-10-03

## 2019-01-27 ENCOUNTER — Ambulatory Visit: Payer: Medicare Other | Admitting: Physical Therapy

## 2019-01-29 ENCOUNTER — Encounter: Payer: Medicare Other | Admitting: Physical Therapy

## 2019-02-02 DIAGNOSIS — S42215D Unspecified nondisplaced fracture of surgical neck of left humerus, subsequent encounter for fracture with routine healing: Secondary | ICD-10-CM | POA: Diagnosis not present

## 2019-02-16 ENCOUNTER — Encounter: Payer: Self-pay | Admitting: Physical Therapy

## 2019-02-16 DIAGNOSIS — M6281 Muscle weakness (generalized): Secondary | ICD-10-CM

## 2019-02-16 DIAGNOSIS — M25611 Stiffness of right shoulder, not elsewhere classified: Secondary | ICD-10-CM

## 2019-02-16 DIAGNOSIS — M25511 Pain in right shoulder: Secondary | ICD-10-CM

## 2019-02-16 NOTE — Therapy (Signed)
[facility] [address], [place], [address] Phone: [phone]   Fax:  [phone]  Physical Therapy No-Visit Discharge Summary Reporting period: 12/16/2018 - 01/20/2019  Patient Details  Name: Kaitlin Dean MRN: [id number] Date of Birth: [birth year] Referring Provider (PT): Alveda Reasons, MD   Encounter Date: 02/16/2019    Past Medical History:  Diagnosis Date  . 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 Barrett's esophagus   . Chicken pox   . Complication of anesthesia    woke up 15 years ago and felt like could not breathe - no complications   . Diverticulosis of colon without hemorrhage 02/28/2014  . GERD (gastroesophageal reflux disease)   . History of fractured kneecap   . History of fractured pelvis   . History of hiatal hernia   . Hypercholesterolemia   . Hypertension   . Osteoarthritis   . Osteoporosis   . Pneumonia    in past  . 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 Shoulder fracture, right 11/07/2018  . Skin cancer     Past Surgical History:  Procedure Laterality Date  . BREAST EXCISIONAL BIOPSY Right 2000   benign  . BREAST SURGERY  2000   Biopsy  . CATARACT EXTRACTION W/PHACO Right 12/05/2016   Procedure: CATARACT EXTRACTION PHACO AND INTRAOCULAR LENS PLACEMENT ([place]) RIGHT SYMFONY LENS;  Surgeon: Leandrew Koyanagi, MD;  Location: [place];  Service: Ophthalmology;  Laterality: Right;  . CATARACT EXTRACTION W/PHACO Left 02/06/2017   Procedure: CATARACT EXTRACTION PHACO AND INTRAOCULAR LENS PLACEMENT ([place]) LEFT SYMFONY LENS;  Surgeon: Leandrew Koyanagi, MD;  Location: [place];  Service: Ophthalmology;  Laterality: Left;  . COLONOSCOPY    . ESOPHAGOGASTRODUODENOSCOPY    . ESOPHAGOGASTRODUODENOSCOPY (EGD) WITH PROPOFOL N/A 06/11/2016   Procedure: ESOPHAGOGASTRODUODENOSCOPY (EGD) WITH PROPOFOL;  Surgeon: Manya Silvas, MD;  Location: [facility]  ENDOSCOPY;  Service: Endoscopy;  Laterality: N/A;  . JOINT REPLACEMENT Left    knee  . PTOSIS REPAIR Bilateral 06/01/2014   Procedure: PTOSIS REPAIR;  Surgeon: Karle Starch, MD;  Location: [place];  Service: Ophthalmology;  Laterality: Bilateral;  . SKIN CANCER EXCISION    . TOTAL KNEE ARTHROPLASTY Left 04/30/2012   Procedure: TOTAL KNEE ARTHROPLASTY;  Surgeon: Gearlean Alf, MD;  Location: [facility];  Service: Orthopedics;  Laterality: Left;  . TUBAL LIGATION      There were no vitals filed for this visit.  Subjective Assessment - 02/16/19 1038    Subjective  Patient called and informed office staff that she would like to discharge because she has improved to her satisfaction.    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 osteoporosis    Limitations  Other (comment)   Functional limitations: cannot fix hair or reach top of head in the back, was restricted in eating with R UE, driving, pushing, seatbelt, reaching, lifting above shoulder, dressing and bathing, gardening, cleaning house, swim backstroke.   Diagnostic tests  radiograph showed fracture of R proximal humerous    Patient Stated Goals  "to get my arms back like they used to be" "would like to be able to swim backstroke"  Pain Onset  More than a month ago       OBJECTIVE Patient is not present for examination at this time. Please see previous documentation for latest objective data.     PT Short Term Goals - 12/23/18 1113      PT SHORT TERM GOAL #1   Title  Be independent with initial home exercise program for self-management of symptoms.    Baseline  initial HEP provided at  [facility] (12/16/2018)    Time  2    Period  Weeks    Status  Achieved    Target Date  12/30/18        PT Long Term Goals - 02/16/19 1042      PT LONG TERM GOAL #1   Title  Be independent with a long-term home exercise program for self-management of symptoms    Baseline  Initial HEP provided at [facility] (12/16/2018);    Time  8    Period  Weeks    Status  Partially Met    Target Date  02/10/19      PT LONG TERM GOAL #2   Title  emonstrate improved FOTO score by 10 units to demonstrate improvement in overall condition and self-reported functional ability.    Baseline  FOTO = 55 (12/16/2018);    Time  8    Period  Weeks    Status  Unable to assess    Target Date  02/10/19      PT LONG TERM GOAL #3   Title  Demonstrate R shoulder AROM equal to or greater than L shoulder AROM with 1/10 or less pain in all planes to allow patient to complete valued activities with less difficulty.    Baseline  see objective measures (12/16/2018);    Time  8    Period  Weeks    Status  Partially Met    Target Date  02/10/19      PT LONG TERM GOAL #4   Title  Demonstrate R shoulder MMT 4/5 or greater to improve ability to perform reaching, carrying, and lifting tasks.    Baseline  testing deferred due to precautions (12/16/2018);    Time  8    Period  Weeks    Status  Unable to assess    Target Date  02/10/19      PT LONG TERM GOAL #5   Title  Complete community, work and/or recreational activities without limitation due to current condition.    Baseline  Functional limitations: cannot fix hair or reach top of head in the back, was restricted in eating with R UE, driving, pushing, seatbelt, reaching, lifting above shoulder, dressing and bathing, gardening, cleaning house, swim backstroke, cooking (12/16/2018);    Time  8    Period  Weeks    Status  Partially Met    Target Date  02/10/19            Plan - 02/16/19 1043    Clinical Impression Statement  Patient attended 8 physical therapy  sessions over the course of her care. She made steady progress and requested discharge following her 8th appointment due to feeling better. Her goals were not formally re-assessed due to unexpected discharge, but patient was participating well in appropriate HEP for stage of recovery and reported subjective improved pain and functional ability. Patient is now discharged due to self-discharge with report of feeling better.    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    Examination-Activity Limitations  Bathing;Lift;Reach Overhead;Carry;Dressing;Hygiene/Grooming;Self Feeding;Other   cannot fix hair or reach top of head in the back, was restricted in eating with R UE, driving, pushing, seatbelt, reaching, lifting above shoulder, dressing and bathing, gardening, cleaning house, swim backstroke, cooking.   Examination-Participation Restrictions  Community Activity;Driving;Laundry;Meal Prep;Yard Work    Stability/Clinical Decision Making  Stable/Uncomplicated    Rehab Potential  Good    PT Frequency  2x / week    PT Duration  8 weeks    PT Treatment/Interventions  ADLs/Self Care Home Management;Aquatic Therapy;Electrical Stimulation;Cryotherapy;Moist Heat;Functional mobility training;Therapeutic activities;Therapeutic exercise;Neuromuscular re-education;Patient/family education;Manual techniques;Passive range of motion;Dry needling;Spinal Manipulations;Joint Manipulations    PT Next Visit Plan  patient is discharged from physical therapy    PT [place] Access  Code: [email or web address]    Consulted and Agree with Plan of Care  Patient       Patient will benefit from skilled therapeutic intervention in order to improve the following deficits and impairments:  Increased fascial restricitons, Pain, Improper body mechanics, Postural dysfunction, Increased muscle spasms, Decreased mobility, Decreased coordination, Decreased activity tolerance, Decreased endurance, Decreased range of motion, Decreased strength, Hypomobility, Impaired perceived functional ability, Impaired UE functional use, Impaired flexibility, Decreased knowledge of precautions  Visit Diagnosis: Acute pain of right shoulder  Stiffness of right shoulder, not elsewhere classified  Muscle weakness (generalized)     Problem List Patient Active Problem List   Diagnosis Date Noted  . Aortic atherosclerosis ([place]) 08/28/2016  . Throat irritation 02/20/2015  . Hyperglycemia 09/01/2014  . Health care maintenance 08/07/2014  . Diverticulosis of colon without hemorrhage 02/28/2014  . Breast pain, right 11/12/2013  . Breast thickening 11/12/2013  . Irregular heart rhythm 02/22/2013  . OA (osteoarthritis) of knee 04/30/2012  . Skin cancer 02/10/2012  . Hypertension 02/04/2012  . Hypercholesterolemia 02/04/2012  . Hiatal hernia with GERD 02/04/2012  . History of HPV infection 02/04/2012  . Barrett's esophagus 02/04/2012    Everlean Alstrom. Graylon Good, PT, DPT 02/16/19, 10:44 AM  [facility] [address] [place], [address] Phone: [phone]   Fax:  [phone]  Name: WILMER BERRYHILL MRN: [id number] Date of Birth: Jun 13, 1945

## 2019-02-24 ENCOUNTER — Ambulatory Visit (INDEPENDENT_AMBULATORY_CARE_PROVIDER_SITE_OTHER): Payer: Medicare PPO

## 2019-02-24 ENCOUNTER — Other Ambulatory Visit: Payer: Self-pay

## 2019-02-24 ENCOUNTER — Encounter: Payer: Self-pay | Admitting: Internal Medicine

## 2019-02-24 ENCOUNTER — Ambulatory Visit (INDEPENDENT_AMBULATORY_CARE_PROVIDER_SITE_OTHER): Payer: Medicare PPO | Admitting: Internal Medicine

## 2019-02-24 VITALS — HR 81 | Ht 63.0 in | Wt 191.0 lb

## 2019-02-24 VITALS — HR 86 | Ht 63.0 in | Wt 191.0 lb

## 2019-02-24 DIAGNOSIS — E7800 Pure hypercholesterolemia, unspecified: Secondary | ICD-10-CM

## 2019-02-24 DIAGNOSIS — I10 Essential (primary) hypertension: Secondary | ICD-10-CM | POA: Diagnosis not present

## 2019-02-24 DIAGNOSIS — K2270 Barrett's esophagus without dysplasia: Secondary | ICD-10-CM | POA: Diagnosis not present

## 2019-02-24 DIAGNOSIS — K449 Diaphragmatic hernia without obstruction or gangrene: Secondary | ICD-10-CM

## 2019-02-24 DIAGNOSIS — S42201D Unspecified fracture of upper end of right humerus, subsequent encounter for fracture with routine healing: Secondary | ICD-10-CM | POA: Diagnosis not present

## 2019-02-24 DIAGNOSIS — I700 Atherosclerosis of aorta: Secondary | ICD-10-CM | POA: Diagnosis not present

## 2019-02-24 DIAGNOSIS — K219 Gastro-esophageal reflux disease without esophagitis: Secondary | ICD-10-CM

## 2019-02-24 DIAGNOSIS — R739 Hyperglycemia, unspecified: Secondary | ICD-10-CM | POA: Diagnosis not present

## 2019-02-24 DIAGNOSIS — Z0000 Encounter for general adult medical examination without abnormal findings: Secondary | ICD-10-CM | POA: Diagnosis not present

## 2019-02-24 DIAGNOSIS — S42309A Unspecified fracture of shaft of humerus, unspecified arm, initial encounter for closed fracture: Secondary | ICD-10-CM | POA: Insufficient documentation

## 2019-02-24 DIAGNOSIS — Z8619 Personal history of other infectious and parasitic diseases: Secondary | ICD-10-CM

## 2019-02-24 MED ORDER — PANTOPRAZOLE SODIUM 40 MG PO TBEC: 40.0000 mg | DELAYED_RELEASE_TABLET | Freq: Every day | ORAL | 3 refills | Status: DC

## 2019-02-24 NOTE — Assessment & Plan Note (Signed)
On vytorin.

## 2019-02-24 NOTE — Progress Notes (Signed)
Patient ID: Kaitlin Dean, female   DOB: February 02, 1945, 74 y.o.   MRN: [id number]   Virtual Visit via telephone Note  This visit type was conducted due to national recommendations for restrictions regarding the COVID-19 pandemic (e.g. social distancing).  This format is felt to be most appropriate for this patient at this time.  All issues noted in this document were discussed and addressed.  No physical exam was performed (except for noted visual exam findings with Video Visits).   I connected with Bradly Bienenstock by telephone and verified that I am speaking with the correct person using two identifiers. Location patient: home Location provider: work  Persons participating in the telephone visit: patient, provider  The limitations, risks, security and privacy concerns of performing an evaluation and management service by telephone and the availability of in person appointments have been discussed. The patient expressed understanding and agreed to proceed.   Reason for visit: scheduled follow up.   HPI: She reports she is doing relatively well.  Handling stress.  States she is up to date with gyn exams.  Evaluated 03/2018.  States everything checked out fine per pt.  Has a history of Barretts.  Is having issues with increased belching and gas.  Some heartburn. No problems swallowing.  Has noticed when she eats - will cough.  Has been taking omeprazole. Discussed trying protonix.  Has been taking a probiotic.  She request to establish with GI in [place].  Is s/p right humerus fracture (s/p fall). Saw Dr Mack Guise.  Had PT.  Helped.  No chest pain.  No sob.  No abdominal pain.  Bowels moving.  Last colonoscopy - 10/2013.     ROS: See pertinent positives and negatives per HPI.  Past Medical History:  Diagnosis Date  . 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 Barrett's esophagus   . Chicken pox   . Complication of anesthesia    woke up 15 years ago and felt like could not breathe - no  complications   . Diverticulosis of colon without hemorrhage 02/28/2014  . GERD (gastroesophageal reflux disease)   . History of fractured kneecap   . History of fractured pelvis   . History of hiatal hernia   . Hypercholesterolemia   . Hypertension   . Osteoarthritis   . Osteoporosis   . Pneumonia    in past  . 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 Shoulder fracture, right 11/07/2018  . Skin cancer     Past Surgical History:  Procedure Laterality Date  . BREAST EXCISIONAL BIOPSY Right 2000   benign  . BREAST SURGERY  2000   Biopsy  . CATARACT EXTRACTION W/PHACO Right 12/05/2016   Procedure: CATARACT EXTRACTION PHACO AND INTRAOCULAR LENS PLACEMENT ([place]) RIGHT SYMFONY LENS;  Surgeon: Leandrew Koyanagi, MD;  Location: [place];  Service: Ophthalmology;  Laterality: Right;  . CATARACT EXTRACTION W/PHACO Left 02/06/2017   Procedure: CATARACT EXTRACTION PHACO AND INTRAOCULAR LENS PLACEMENT ([place]) LEFT SYMFONY LENS;  Surgeon: Leandrew Koyanagi, MD;  Location: [place];  Service: Ophthalmology;  Laterality: Left;  . COLONOSCOPY    . ESOPHAGOGASTRODUODENOSCOPY    . ESOPHAGOGASTRODUODENOSCOPY (EGD) WITH PROPOFOL N/A 06/11/2016   Procedure: ESOPHAGOGASTRODUODENOSCOPY (EGD) WITH PROPOFOL;  Surgeon: Manya Silvas, MD;  Location[facility];  Service: Endoscopy;  Laterality: N/A;  . JOINT REPLACEMENT Left    knee  . PTOSIS REPAIR Bilateral 06/01/2014   Procedure: PTOSIS REPAIR;  Surgeon: Karle Starch, MD;  Location[facility];  Service: Ophthalmology;  Laterality: Bilateral;  . SKIN CANCER EXCISION    . TOTAL KNEE ARTHROPLASTY Left 04/30/2012   Procedure: TOTAL KNEE ARTHROPLASTY;  Surgeon: Gearlean Alf, MD;  Location: [facility];  Service: Orthopedics;  Laterality: Left;  . TUBAL LIGATION      Family History  Problem Relation Age of Onset  . Hypertension Mother   . Congestive Heart Failure Mother   . Stroke Father   . Stroke Brother   .  Heart failure Brother   . Sarcoidosis Sister        died of kidney failure  . Breast cancer Neg Hx   . Colon cancer Neg Hx     SOCIAL HX: reviewed.    Current Outpatient Medications:  .  albuterol (PROVENTIL HFA;VENTOLIN HFA) 108 (90 Base) MCG/ACT inhaler, Inhale 2 puffs into the lungs every 6 (six) hours as needed for wheezing or shortness of breath., Disp: 1 Inhaler, Rfl: 2 .  Cholecalciferol (VITAMIN D3 PO), Take by mouth daily., Disp: , Rfl:  .  ezetimibe-simvastatin (VYTORIN) 10-20 MG tablet, TAKE 1 TABLET BY MOUTH AT BEDTIME, Disp: 90 tablet, Rfl: 1 .  MELATONIN PO, Take 1 capsule by mouth., Disp: , Rfl:  .  Multiple Vitamin (MULTIVITAMIN) capsule, Take 1 capsule by mouth daily. AM, Disp: , Rfl:  .  telmisartan (MICARDIS) 40 MG tablet, TAKE ONE TABLET EVERY MORNING, Disp: 90 tablet, Rfl: 1 .  vitamin B-12 (CYANOCOBALAMIN) 1000 MCG tablet, Take 1,000 mcg by mouth daily., Disp: , Rfl:  .  Vitamin E 400 units TABS, Take by mouth daily., Disp: , Rfl:  .  pantoprazole (PROTONIX) 40 MG tablet, Take 1 tablet (40 mg total) by mouth daily., Disp: 30 tablet, Rfl: 3  EXAM:  GENERAL: alert.  Sounds to be in no acute distress.  Answering questions appropriately.    PSYCH/NEURO: pleasant and cooperative, no obvious depression or anxiety, speech and thought processing grossly intact  ASSESSMENT AND PLAN:  Discussed the following assessment and plan:  Barrett's esophagus Per note, dates back to 2006.  Has noticed some increased heartburn, gas and belching.  Some cough with eating.  Start protonix 72m q day.  Schedule f/u with GI.    Aortic atherosclerosis ([place] vytorin.   Hiatal hernia with GERD Some belching, heartburn, increased gas and cough with eating.  protonix as directed.  Given history of Barretts, refer to GI for further evaluation.    History of HPV infection Followed by gyn.  Just evaluated 03/2018.  Continue f/u with gyn.   Hypercholesterolemia Low cholesterol diet  and exercise.  Follow lipid panel and liver function tests.    Hyperglycemia Low carb diet and exercise.  Follow met b and a1c.   Hypertension Blood pressure has been doing well.  Same medication regimen.  Follow pressures.  Follow metabolic panel.   Humerus fracture S/p fracture right humerus s/p fall.  Saw Dr KMack Guise  Physical therapy.  Follow.    Orders Placed This Encounter  Procedures  . CBC with Differential/Platelet    Standing Status:   Future    Standing Expiration Date:   02/24/2020  . Hemoglobin A1c    Standing Status:   Future    Standing Expiration Date:   02/24/2020  . Hepatic function panel    Standing Status:   Future    Standing Expiration Date:   02/24/2020  . Lipid panel    Standing Status:   Future    Standing Expiration Date:  02/24/2020  . Basic metabolic panel    Standing Status:   Future    Standing Expiration Date:   02/24/2020  . Ambulatory referral to Gastroenterology    Referral Priority:   Routine    Referral Type:   Consultation    Referral Reason:   Specialty Services Required    Number of Visits Requested:   1    Meds ordered this encounter  Medications  . pantoprazole (PROTONIX) 40 MG tablet    Sig: Take 1 tablet (40 mg total) by mouth daily.    Dispense:  30 tablet    Refill:  3     I discussed the assessment and treatment plan with the patient. The patient was provided an opportunity to ask questions and all were answered. The patient agreed with the plan and demonstrated an understanding of the instructions.   The patient was advised to call back or seek an in-person evaluation if the symptoms worsen or if the condition fails to improve as anticipated.  I provided 21 minutes of non-face-to-face time during this encounter.   Einar Pheasant, MD

## 2019-02-24 NOTE — Assessment & Plan Note (Signed)
Followed by gyn.  Just evaluated 03/2018.  Continue f/u with gyn.

## 2019-02-24 NOTE — Patient Instructions (Addendum)
  Ms. Pogosyan , Thank you for taking time to come for your Medicare Wellness Visit. I appreciate your ongoing commitment to your health goals. Please review the following plan we discussed and let me know if I can assist you in the future.   These are the goals we discussed: Goals    . Weight (lb) < 190 lb (86.2 kg)     Lose 10lbs       This is a list of the screening recommended for you and due dates:  Health Maintenance  Topic Date Due  . Mammogram  08/07/2019  . Tetanus Vaccine  01/30/2020  . Colon Cancer Screening  11/24/2023  . Flu Shot  Completed  . DEXA scan (bone density measurement)  Completed  .  Hepatitis C: One time screening is recommended by Center for Disease Control  (CDC) for  adults born from [birth year] through 1967.   Completed

## 2019-02-24 NOTE — Assessment & Plan Note (Signed)
Low cholesterol diet and exercise.  Follow lipid panel and liver function tests.  

## 2019-02-24 NOTE — Assessment & Plan Note (Signed)
Blood pressure has been doing well.  Same medication regimen.  Follow pressures.  Follow metabolic panel.   

## 2019-02-24 NOTE — Progress Notes (Addendum)
Subjective:   Kaitlin Dean is a 74 y.o. female who presents for Medicare Annual (Subsequent) preventive examination.  Review of Systems:  No ROS.  Medicare Wellness Virtual Visit.  Visual/audio telehealth visit, UTA vital signs.   Ht/Wt provided. See social history for additional risk factors.   Cardiac Risk Factors include: advanced age (>82men, >65 women);hypertension     Objective:     Vitals: Pulse 86   Ht 5\' 3"  (1.6 m)   Wt 191 lb (86.6 kg)   LMP 01/29/1993   BMI 33.83 kg/m   Body mass index is 33.83 kg/m.  Advanced Directives 02/24/2019 12/16/2018 02/21/2018 02/19/2017 02/06/2017 12/05/2016 06/11/2016  Does Patient Have a Medical Advance Directive? Yes Yes Yes Yes Yes Yes Yes  Type of Paramedic of [place];Living will Living will;Healthcare Power of [place];Living will [place];Living will [place];Living will [place];Living will [place];Living will  Does patient want to make changes to medical advance directive? No - Patient declined No - Patient declined No - Patient declined - - - -  Copy of [place] in Chart? No - copy requested - No - copy requested No - copy requested No - copy requested No - copy requested No - copy requested  Pre-existing out of facility DNR order (yellow form or pink MOST form) - - - - - - -    Tobacco Social History   Tobacco Use  Smoking Status Never Smoker  Smokeless Tobacco Never Used     Counseling given: Not Answered   Clinical Intake:  Pre-visit preparation completed: Yes        Diabetes: No  How often do you need to have someone help you when you read instructions, pamphlets, or other written materials from your doctor or pharmacy?: 1 - Never  Interpreter Needed?: No     Past Medical History:  Diagnosis Date  . 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 Barrett's  esophagus   . Chicken pox   . Complication of anesthesia    woke up 15 years ago and felt like could not breathe - no complications   . Diverticulosis of colon without hemorrhage 02/28/2014  . GERD (gastroesophageal reflux disease)   . History of fractured kneecap   . History of fractured pelvis   . History of hiatal hernia   . Hypercholesterolemia   . Hypertension   . Osteoarthritis   . Osteoporosis   . Pneumonia    in past  . 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 Shoulder fracture, right 11/07/2018  . Skin cancer    Past Surgical History:  Procedure Laterality Date  . BREAST EXCISIONAL BIOPSY Right 2000   benign  . BREAST SURGERY  2000   Biopsy  . CATARACT EXTRACTION W/PHACO Right 12/05/2016   Procedure: CATARACT EXTRACTION PHACO AND INTRAOCULAR LENS PLACEMENT ([place]) RIGHT SYMFONY LENS;  Surgeon: Leandrew Koyanagi, MD;  Location: [place];  Service: Ophthalmology;  Laterality: Right;  . CATARACT EXTRACTION W/PHACO Left 02/06/2017   Procedure: CATARACT EXTRACTION PHACO AND INTRAOCULAR LENS PLACEMENT ([place]) LEFT SYMFONY LENS;  Surgeon: Leandrew Koyanagi, MD;  Location: [place];  Service: Ophthalmology;  Laterality: Left;  . COLONOSCOPY    . ESOPHAGOGASTRODUODENOSCOPY    . ESOPHAGOGASTRODUODENOSCOPY (EGD) WITH PROPOFOL N/A 06/11/2016   Procedure: ESOPHAGOGASTRODUODENOSCOPY (EGD) WITH PROPOFOL;  Surgeon: Manya Silvas, MD;  [facility] END[facility]COPY;  Service:  Endoscopy;  Laterality: N/A;  . JOINT REPLACEMENT Left    knee  . PTOSIS REPAIR Bilateral 06/01/2014   Procedure: PTOSIS REPAIR;  Surgeon: Karle Starch, MD;  Location: [place];  Service: Ophthalmology;  Laterality: Bilateral;  . SKIN CANCER EXCISION    . TOTAL KNEE ARTHROPLASTY Left 04/30/2012   Procedure: TOTAL KNEE ARTHROPLASTY;  Surgeon: Gearlean Alf, MD;  [facility];  Service: Orthopedics;  Laterality: Left;  . TUBAL LIGATION     Family History  Problem Relation  Age of Onset  . Hypertension Mother   . Congestive Heart Failure Mother   . Stroke Father   . Stroke Brother   . Heart failure Brother   . Sarcoidosis Sister        died of kidney failure  . Breast cancer Neg Hx   . Colon cancer Neg Hx    Social History   Socioeconomic History  . Marital status: Widowed    Spouse name: Not on file  . Number of children: 2  . Years of education: Not on file  . Highest education level: Not on file  Occupational History    Comment: Retired Statistician  Tobacco Use  . Smoking status: Never Smoker  . Smokeless tobacco: Never Used  Substance and Sexual Activity  . Alcohol use: Yes    Alcohol/week: 1.0 standard drinks    Types: 1 Glasses of wine per week    Comment: SOCIALLY  . Drug use: No  . Sexual activity: Not Currently  Other Topics Concern  . 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 Difficulty of Paying Living Expenses: Not on file  Food Insecurity:   . Worried About Charity fundraiser in the Last Year: Not on file  . Ran Out of Food in the Last Year: Not on file  Transportation Needs:   . Lack of Transportation (Medical): Not on file  . Lack of Transportation (Non-Medical): Not on file  Physical Activity:   . Days of Exercise per Week: Not on file  . Minutes of Exercise per Session: Not on file  Stress:   . Feeling of Stress : Not on file  Social Connections:   . Frequency of Communication with Friends and Family: Not on file  . Frequency of Social Gatherings with Friends and Family: Not on file  . Attends Religious Services: Not on file  . Active Member of Clubs or Organizations: Not on file  .  Attends Archivist Meetings: Not on file  . Marital Status: Not on file    Outpatient Encounter Medications as of 02/24/2019  Medication Sig  . albuterol (PROVENTIL HFA;VENTOLIN HFA) 108 (90 Base) MCG/ACT inhaler Inhale 2 puffs into the lungs every 6 (six) hours as needed for wheezing or shortness of breath.  . Cholecalciferol (VITAMIN D3 PO) Take by mouth daily.  [place] ezetimibe-simvastatin (VYTORIN) 10-20 MG tablet TAKE 1 TABLET BY MOUTH AT BEDTIME  . MELATONIN PO Take 1 capsule by mouth.  . Multiple Vitamin (MULTIVITAMIN) capsule Take 1 capsule by mouth daily. AM  . omeprazole (PRILOSEC) 20 MG capsule Take  20 mg by mouth daily. AM  . telmisartan (MICARDIS) 40 MG tablet TAKE ONE TABLET EVERY MORNING  . vitamin B-12 (CYANOCOBALAMIN) 1000 MCG tablet Take 1,000 mcg by mouth daily.  . Vitamin E 400 units TABS Take by mouth daily.   No facility-administered encounter medications on file as of 02/24/2019.    Activities of Daily Living In your present state of health, do you have any difficulty performing the following activities: 02/24/2019  Hearing? N  Vision? N  Difficulty concentrating or making decisions? N  Walking or climbing stairs? N  Dressing or bathing? N  Doing errands, shopping? N  Preparing Food and eating ? N  Using the Toilet? N  In the past six months, have you accidently leaked urine? N  Do you have problems with loss of bowel control? N  Managing your Medications? N  Managing your Finances? N  Housekeeping or managing your Housekeeping? N  Some recent data might be hidden    Patient Care Team: Einar Pheasant, MD as PCP - General (Internal Medicine)    Assessment:   This is a routine wellness examination for Jahnaya.  Nurse connected with patient 02/24/19 at  8:30 AM EST by a telephone enabled telemedicine application and verified that I am speaking with the correct person using two identifiers. Patient stated full name and DOB. Patient gave permission to  continue with virtual visit. Patient's location was at home and Nurse's location was at [place] office.   Patient is alert and oriented x3. Patient denies difficulty focusing or concentrating. Patient likes to read and plays some games with neighbors for brain stimulation.   Health Maintenance Due: See completed HM at the end of note.   Eye: Visual acuity not assessed. Virtual visit. Followed by their ophthalmologist.  Dental: Visits every 6 months.    Hearing: Demonstrates normal hearing during visit.  Safety:  Patient feels safe at home- yes Patient does have smoke detectors at home- yes Patient does wear sunscreen or protective clothing when in direct sunlight - yes Patient does wear seat belt when in a moving vehicle - yes Patient drives- yes Adequate lighting in walkways free from debris- yes Grab bars and handrails used as appropriate- yes Ambulates with an assistive device- no  Social: Alcohol intake - yes      Smoking history- never  Smokers in home? none Illicit drug use? none  Medication: Taking as directed and without issues.  Self managed - yes   Covid-19: Precautions and sickness symptoms discussed. Wears mask, social distancing, hand hygiene as appropriate.   Activities of Daily Living Patient denies needing assistance with: household chores, feeding themselves, getting from bed to chair, getting to the toilet, bathing/showering, dressing, managing money, or preparing meals.   Discussed the importance of a healthy diet, water intake and the benefits of aerobic exercise.   Physical activity- walking and active with yard work, no routine.   Diet:  Regular Water: good intake Caffeine: 1 cup of coffee  Other Providers Patient Care Team: Einar Pheasant, MD as PCP - General (Internal Medicine) Exercise Activities and Dietary recommendations Current Exercise Habits: Home exercise routine, Type of exercise: walking, Intensity: Moderate  Goals    .  Weight (lb) < 190 lb (86.2 kg)     Lose 10lbs       Fall Risk Fall Risk  02/24/2019 02/21/2018 02/19/2017 02/20/2016 08/11/2015  Falls in the past year? 1 1 No No No  Number falls in past yr: 1 0 - - -  Injury with Fall? 1 - - - -  Comment Broke arm, fell due to unlevel ground. No falls since last reported in 10/2018 - - - -  Follow up Falls evaluation completed;Falls prevention discussed - - - -   Timed Get Up and Go performed: no, virtual visit  Depression Screen PHQ 2/9 Scores 02/24/2019 02/21/2018 01/24/2018 02/19/2017  PHQ - 2 Score 0 0 1 0  PHQ- 9 Score - - 7 0     Cognitive Function MMSE - Mini Mental State Exam 02/19/2017 02/14/2015  Orientation to time 5 5  Orientation to Place 5 5  Registration 3 3  Attention/ Calculation 5 5  Recall 3 3  Language- name 2 objects 2 2  Language- repeat 1 1  Language- follow 3 step command 3 3  Language- read & follow direction 1 1  Write a sentence 1 1  Copy design 1 1  Total score 30 30     6CIT Screen 02/24/2019 02/21/2018 02/20/2016  What Year? 0 points 0 points 0 points  What month? 0 points 0 points 0 points  What time? 0 points 0 points 0 points  Count back from 20 - 0 points 0 points  Months in reverse - 0 points 0 points  Repeat phrase - 0 points 0 points  Total Score - 0 0    Immunization History  Administered Date(s) Administered  . Influenza Split 10/30/2011, 11/14/2012  . Influenza, High Dose Seasonal PF 11/01/2015, 11/09/2016, 11/18/2017, 12/03/2018  . Influenza,inj,Quad PF,6+ Mos 11/12/2013, 01/18/2015  . Influenza-Unspecified 11/23/2011  . Moderna SARS-COVID-2 Vaccination 02/04/2019  . Pneumococcal Conjugate-13 08/27/2013  . Pneumococcal Polysaccharide-23 08/21/2017  . Zoster 03/29/2010  . Zoster Recombinat (Shingrix) 10/01/2017, 01/03/2018   Screening Tests Health Maintenance  Topic Date Due  . MAMMOGRAM  08/07/2019  . TETANUS/TDAP  01/30/2020  . COLONOSCOPY  11/24/2023  . INFLUENZA VACCINE  Completed  .  DEXA SCAN  Completed  . Hepatitis C Screening  Completed      Plan:   Keep all routine maintenance appointments.   Follow up with your doctor today @ 9:00  Medicare Attestation I have personally reviewed: The patient's medical and social history Their use of alcohol, tobacco or illicit drugs Their current medications and supplements The patient's functional ability including ADLs,fall risks, home safety risks, cognitive, and hearing and visual impairment Diet and physical activities Evidence for depression   I have reviewed and discussed with patient certain preventive protocols, quality metrics, and best practice recommendations.     Varney Biles, LPN  [id number]   Reviewed above information.  Agree with assessment and plan.    Dr Nicki Reaper

## 2019-02-24 NOTE — Assessment & Plan Note (Signed)
Some belching, heartburn, increased gas and cough with eating.  protonix as directed.  Given history of Barretts, refer to GI for further evaluation.

## 2019-02-24 NOTE — Assessment & Plan Note (Signed)
S/p fracture right humerus s/p fall.  Saw Dr Mack Guise.  Physical therapy.  Follow.

## 2019-02-24 NOTE — Assessment & Plan Note (Signed)
Per note, dates back to 2006.  Has noticed some increased heartburn, gas and belching.  Some cough with eating.  Start protonix 40mg  q day.  Schedule f/u with GI.

## 2019-02-24 NOTE — Assessment & Plan Note (Signed)
Low carb diet and exercise.  Follow met b and a1c.  

## 2019-03-07 ENCOUNTER — Other Ambulatory Visit: Payer: Self-pay | Admitting: Internal Medicine

## 2019-03-09 ENCOUNTER — Telehealth: Payer: Self-pay | Admitting: Internal Medicine

## 2019-03-09 NOTE — Telephone Encounter (Signed)
Protonix not working b/c it is generic wants if she should go back to taking Prilosec twice a day 20mg  or should she do Protonix 2x a day but that is 40 mg or is there something else she can take?

## 2019-03-10 NOTE — Telephone Encounter (Signed)
Pt agreed. She will continue prilosec and if she has increased issues she will be re evaluated.

## 2019-03-10 NOTE — Telephone Encounter (Signed)
I am ok if she takes prilosec.  Need to confirm if symptoms completely resolve with prilosec.  She has had EGD previously (2018).  If persistent symptoms despite medication, will need to be reevaluated.

## 2019-03-10 NOTE — Telephone Encounter (Signed)
Patient stated that the protonix is not working. She has started back on her prilosec 20 mg q day. Working better. She will buy the name brand prilosec. Pt stated to let her know if this is a problem. Confirmed she is doing ok, the prilosec just works better for her.

## 2019-03-13 ENCOUNTER — Ambulatory Visit: Payer: Medicare PPO | Admitting: Gastroenterology

## 2019-03-16 ENCOUNTER — Other Ambulatory Visit: Payer: Self-pay

## 2019-03-16 DIAGNOSIS — S42215D Unspecified nondisplaced fracture of surgical neck of left humerus, subsequent encounter for fracture with routine healing: Secondary | ICD-10-CM | POA: Diagnosis not present

## 2019-03-17 ENCOUNTER — Encounter: Payer: Self-pay | Admitting: Physical Therapy

## 2019-03-17 ENCOUNTER — Ambulatory Visit: Payer: Medicare PPO | Attending: Orthopedic Surgery | Admitting: Physical Therapy

## 2019-03-17 DIAGNOSIS — M6281 Muscle weakness (generalized): Secondary | ICD-10-CM | POA: Insufficient documentation

## 2019-03-17 DIAGNOSIS — M25511 Pain in right shoulder: Secondary | ICD-10-CM | POA: Diagnosis not present

## 2019-03-17 DIAGNOSIS — M25611 Stiffness of right shoulder, not elsewhere classified: Secondary | ICD-10-CM | POA: Insufficient documentation

## 2019-03-17 NOTE — Therapy (Signed)
[facility] [address], [place], [address] Phone: [phone]   Fax:  [phone]  Physical Therapy Re-Evaluation / Progress Note / Re-Certification Reporting period: 01/20/2020 - 03/17/2019 Reason for re-evaluation: Patient had a change in medical status. Patient was recently discharged after her last visit in December 2020, but was unable to achieve adequate ROM on her own, so physician sent her back to physical therapy to work on it.  Patient Details  Name: Kaitlin Dean MRN: [id number] Date of Birth: [birth year] Referring Provider (PT): Alveda Reasons, MD   Encounter Date: 03/17/2019  PT End of Session - 03/17/19 1938    Visit Number  10    Number of Visits  27    Date for PT Re-Evaluation  05/12/19    Authorization Type  [employer] medicare reporting from 03/17/2019    Authorization - Visit Number  10    Authorization - Number of Visits  10    PT Start Time  3709    PT Stop Time  6438    PT Time Calculation (min)  40 min    Activity Tolerance  Patient tolerated treatment well    Behavior During Therapy  [facility] for tasks assessed/performed       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)    Shoulder fracture, right 11/07/2018   Skin cancer     Past Surgical History:  Procedure Laterality Date   BREAST EXCISIONAL BIOPSY Right 2000   benign   BREAST SURGERY   2000   Biopsy   CATARACT EXTRACTION W/PHACO Right 12/05/2016   Procedure: CATARACT EXTRACTION PHACO AND INTRAOCULAR LENS PLACEMENT ([place]) RIGHT SYMFONY LENS;  Surgeon: Leandrew Koyanagi, MD;  Location: [facility];  Service: Ophthalmology;  Laterality: Right;   CATARACT EXTRACTION W/PHACO Left 02/06/2017   Procedure: CATARACT EXTRACTION PHACO AND INTRAOCULAR LENS PLACEMENT ([place]) LEFT SYMFONY LENS;  Surgeon: Leandrew Koyanagi, MD;  Location: [place];  Service: Ophthalmology;  Laterality: Left;   COLONOSCOPY     ESOPHAGOGASTRODUODENOSCOPY     ESOPHAGOGASTRODUODENOSCOPY (EGD) WITH PROPOFOL N/A 06/11/2016   Procedure: ESOPHAGOGASTRODUODENOSCOPY (EGD) WITH PROPOFOL;  Surgeon: Manya Silvas, MD;  Location: [facility];  Service: Endoscopy;  Laterality: N/A;   JOINT REPLACEMENT Left    knee   PTOSIS REPAIR Bilateral 06/01/2014   Procedure: PTOSIS REPAIR;  Surgeon: Karle Starch, MD;  Location: [place] [facility];  Service: Ophthalmology;  Laterality: Bilateral;   SKIN CANCER EXCISION     TOTAL KNEE ARTHROPLASTY Left 04/30/2012   Procedure: TOTAL KNEE ARTHROPLASTY;  Surgeon: Gearlean Alf, MD;  Location: [facility];  Service: Orthopedics;  Laterality: Left;   TUBAL LIGATION      There were no vitals filed for this visit.  Subjective Assessment - 03/17/19 1308    Subjective  Patient is returning to  physical therapy following recent episode of care with last visit in January 20, 2019. She was treated following R humeral fracture due to fall. Patient reports she has had both her COVID 19 vaccinations completed 3 weeks ago and is now more comfortable coming to the clinic where the clinician has also had both COVID 19 vaccinations. Stopping PT previously had some to do with concern about risk of COVID 19. She states she saw her surgeon yesterday and he took radiographs and said her bone was healed and she did not need more care for bone healing, but she was lacking some  ROM. He sent her to PT to work on this because patient stated she was not able to do it independently. Patient reports no other significant changes to her health since last PT visit.    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 osteoporosis    Limitations  Other (comment)   Functional limitations: cannot fix hair or reach top of head in the back, reaching front, back, side, lifting above shoulder, cleaning house, swim backstroke, golfing.   Diagnostic tests  radiograph showed fracture of R proximal humerous, more recent radiograph show good healing per pt    Patient Stated Goals  "to get my arms back like they used to be" "would like to be able to swim backstroke"  Wants to be able to raise R arm completely up and also has pain with extension/internal rotation that she wants to go away.    Currently in Pain?  No/denies    Pain Score  0-No pain   Worst: 5/10 (went away quickly)   Pain Location  Shoulder    Pain Orientation  Right;Lateral    Pain Descriptors / Indicators  Aching;Burning    Pain Type  Chronic pain    Pain Onset  More than a month ago    Aggravating Factors   reaching, using it a lot and making it go where it doesn't want to go    Pain Relieving Factors  hot water, pain medication    Effect of Pain on Daily Activities  Functional limitations: cannot fix hair or reach top of head in the back, reaching front, back, side, lifting above shoulder, cleaning house, swim backstroke, golfing, grooming         [facility] PT Assessment - 03/17/19 0001      Assessment   Medical Diagnosis  closed fracture of proximal R humerous    Referring Provider (PT)   Alveda Reasons, MD    Onset Date/Surgical Date  11/07/18   approximately 12/23/2018   Hand Dominance  Right    Next MD Visit  --   in one month   Prior Therapy  this is a continuation of a prior episode of care for same issue.       Precautions   Precaution Comments  --      Restrictions   Weight Bearing Restrictions  No    RUE Weight Bearing  --    Other Position/Activity Restrictions  --      Balance Screen   Has the patient fallen in the past 6 months  No    Has the patient had a decrease in activity level  because of a fear of falling?   No    Is the patient reluctant to leave their home because of a fear of falling?   No      Home Environment   Living Environment  Private residence    Living Arrangements  Alone    Type of [place] Access  Level entry    Home Layout  One level    [place]  None      Prior Function   Level of [place]  Retired    Biomedical scientist  retired Education officer, museum    Leisure  work with plants (lots of houseplants), be with grandchildren      Cognition   Overall Cognitive Status  Within Functional Limits for tasks assessed      Observation/Other Assessments   Observations  see note from 03/17/2019 for latest objective data    Focus on Therapeutic Outcomes (FOTO)   71        [place] = 71     OBSERVATION/INSPECTION  Posture: forward head, rounded shoulders, slumped in sitting, R shoulder slightly lower than left.   Tremor: none  Muscle bulk: generally overall mildly decreased consistent with lack of regular adequate exercise or physical activity.    Skin: WNL   Bed mobility: WFL.  Transfers: WFL  Gait: grossly WFL for household and short community ambulation. More detailed gait analysis deferred to later date as needed.   PERIPHERAL JOINT MOTION (in degrees)  *Indicates pain, R/L 03/17/19    Joint/Motion AROM PROM Comments  Shoulder      Flexion 90*/130 120*/154 AROM feels tight R, PROM empty end feel  Extension 50*/64 /   Abduction 80*/114 90*/163 compensatory shoulder hike  External rotation 28*/65 25*/60 AROM R side in neutral, PROM R at 90  Internal rotation L5*/T8 55/55    Comments: B elbows and wrist WNL; compensatory shoulder hike on R with shoulder elevation.   MUSCLE PERFORMANCE (MMT):   *Indicates pain 03/17/19 Date Date  Joint/Motion R/L R/L R/L  Shoulder     Flexion 4/4+ / /  Extension / / /  Abduction 4/4+ / /  External rotation 3/4+ / /  Internal rotation 4+/4+ / /  Elbow     Flexion 4+/ / /  Extension 4+/ / /  Hand     Grip WFL / /  Comments: MMT is within available range (see AROM above)  FUNCTIONAL MOBILITY:  Bed mobility: WFL.  Transfers: [facility]  EDUCATION/COGNITION: Patient is alert and oriented X 4.  Objective measurements completed on examination: See above findings.     TREATMENT:   Manual therapy: to reduce pain and tissue tension, improve range of motion, neuromodulation, in order to promote improved ability to complete functional activities. - supine PROM R shoulder flexion, abduction, IR, ER with overpressure as tolerated x 4 each directions to improve motion.  - Education on diagnosis, prognosis, POC, anatomy and physiology of current condition.    PT Education - 03/17/19 1938    Education Details  Exercise purpose/form. Self management techniques. Education on diagnosis, prognosis, POC, anatomy and physiology of current condition Education on HEP    Person(s) Educated  Patient    Methods  Explanation;Demonstration    Comprehension  Verbalized understanding;Verbal cues required;Need further instruction       PT Short Term Goals - 03/17/19 1940      PT SHORT TERM  GOAL #1   Title  Be independent with initial home exercise program for self-management of symptoms.    Baseline  initial HEP provided at IE (12/16/2018)    Time  2    Period  Weeks    Status   Revised    Target Date  03/31/19        PT Long Term Goals - 03/17/19 1940      PT LONG TERM GOAL #1   Title  Be independent with a long-term home exercise program for self-management of symptoms    Baseline  Initial HEP provided at IE (12/16/2018);    Time  8    Period  Weeks    Status  Revised    Target Date  05/12/19      PT LONG TERM GOAL #2   Title  emonstrate improved FOTO score to equal or greater than 73 to demonstrate improvement in overall condition and self-reported functional ability.    Baseline  FOTO = 55 (12/16/2018); FOTO = 71 (03/17/2019);    Time  8    Period  Weeks    Status  Revised    Target Date  05/12/19      PT LONG TERM GOAL #3   Title  Demonstrate R shoulder AROM equal to or greater than L shoulder AROM with 1/10 or less pain in all planes to allow patient to complete valued activities with less difficulty.    Baseline  see objective measures (12/16/2018); continues to be limited but is better than at initial eval - see objective exam (03/17/2019);    Time  8    Period  Weeks    Status  Partially Met    Target Date  05/12/19      PT LONG TERM GOAL #4   Title  Demonstrate R shoulder MMT 4/5 or greater to improve ability to perform reaching, carrying, and lifting tasks.    Baseline  testing deferred due to precautions (12/16/2018); see testing in objective exam (03/17/2019);    Time  8    Period  Weeks    Status  Partially Met    Target Date  05/12/19      PT LONG TERM GOAL #5   Title  Complete community, work and/or recreational activities without limitation due to current condition.    Baseline  Functional limitations: cannot fix hair or reach top of head in the back, was restricted in eating with R UE, driving, pushing, seatbelt, reaching, lifting above shoulder, dressing and bathing, gardening, cleaning house, swim backstroke, cooking (12/16/2018); cannot fix hair or reach top of head in the back, reaching front, back, side, lifting above shoulder,  cleaning house, swim backstroke, golfing, grooming (03/17/2019);    Time  8    Period  Weeks    Status  Partially Met    Target Date  05/12/19      Additional Long Term Goals   Additional Long Term Goals  Yes      PT LONG TERM GOAL #6   Title  Patient will report improvement in PSFS to at least 9/10 in each category (golf, swimming, combing hair in the back), to demonstrate improved function in areas that patient values.    Baseline  swimming = 6/10, golf 7/10, combing hair in back = 7/10 (03/17/2019);    Time  8    Period  Weeks    Status  New    Target Date  05/12/19  Plan - 03/17/19 2006    Clinical Impression Statement  Patient is a 74 y.o. female referred to outpatient physical therapy with a medical diagnosis of unspecified nondisplaced fracture of surgical neck of right humerus with routine healing who presents with signs and symptoms consistent with R shoulder pain, stiffness, weakness, and incoordination consistent with proximal humeral fracture 11/07/2018. Patient is returning to physical therapy after recent discharge for same condition, so this is a re-start of her original episode of care. For this condition, she has attended 10 physical therapy sessions. She origninally made good progress and requested discharge due to improvement in conditions and concerns about the worsening COVID 19 pandemic. She has now received her COVID 19 vaccination series and is comfortable coming back in to the clinic. She was unsatisfied with her progress over the last month and a half working on her own at home and found she had trouble with consistent exercise for her shoulder. She presents to physical therapy with overall improvement from initial eval but continues to lack ROM, strength, and ability to perform her usual and favorite activities including reaching various directions, golf, swimming, and doing her hair. Patient presents with significant ROM, joint stiffness, muscle performance  (strength/power/endurance), glenohumeral rhythm, coordination, posture impairments that are limiting ability to complete any activities that require use of R UE overhead including cannot fix hair or reach top of head in the back, reaching front, back, side, lifting above shoulder, cleaning house, swim backstroke, golfing, grooming. Patient would benefit from continued management of limiting condition by skilled physical therapist to address remaining impairments and functional limitations to work towards stated goals and return to PLOF or maximal functional independence.    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    Examination-Activity Limitations  Bathing;Lift;Reach Overhead;Carry;Dressing;Hygiene/Grooming;Self Feeding;Other   cannot fix hair or reach top of head in the back, was restricted in eating with R UE, driving, pushing, seatbelt, reaching, lifting above shoulder, dressing and bathing, gardening, cleaning house, swim backstroke, cooking.   Examination-Participation Restrictions  Community Activity;Driving;Laundry;Meal Prep;Yard Work    Stability/Clinical Decision Making  Stable/Uncomplicated    Rehab Potential  Good    PT Frequency  2x / week    PT Duration  8 weeks    PT Treatment/Interventions  ADLs/Self Care Home Management;Aquatic Therapy;Electrical Stimulation;Cryotherapy;Moist Heat;Functional mobility training;Therapeutic activities;Therapeutic exercise;Neuromuscular re-education;Patient/family education;Manual techniques;Passive range of motion;Dry  needling;Spinal Manipulations;Joint Manipulations    PT Next Visit Plan  ROM exercise and strengthening for the R shoulder    PT Home Exercise Plan  Medbridge Access Code: [email or web address]    Consulted and Agree with Plan of Care  Patient       Patient will benefit from skilled therapeutic intervention in order to improve the following deficits and impairments:  Increased fascial restricitons, Pain, Improper body mechanics, Postural dysfunction, Increased muscle spasms, Decreased mobility, Decreased coordination, Decreased activity tolerance, Decreased endurance, Decreased range of motion, Decreased strength, Hypomobility, Impaired perceived functional ability, Impaired UE functional use, Impaired flexibility, Decreased knowledge of precautions  Visit Diagnosis: Acute pain of right shoulder  Stiffness of right shoulder, not elsewhere classified  Muscle weakness (generalized)     Problem  List Patient Active Problem List   Diagnosis Date Noted   Humerus fracture 02/24/2019   Aortic atherosclerosis ([place]) 08/28/2016   Throat irritation 02/20/2015   Hyperglycemia 09/01/2014   Health care maintenance 08/07/2014   Diverticulosis of colon without hemorrhage 02/28/2014   Breast pain, right 11/12/2013   Breast thickening 11/12/2013   Irregular heart rhythm 02/22/2013   OA (osteoarthritis) of knee 04/30/2012   Skin cancer 02/10/2012   Hypertension 02/04/2012   Hypercholesterolemia 02/04/2012   Hiatal hernia with GERD 02/04/2012   History of HPV infection 02/04/2012   Barrett's esophagus 02/04/2012    Everlean Alstrom. Graylon Good, PT, DPT 03/17/19, 8:09 PM  [place] [address], [place], [address] Phone: [phone]   Fax:  [phone]  Name: HANG AMMON MRN: [id number] Date of Birth: 1945/04/18

## 2019-03-18 ENCOUNTER — Telehealth: Payer: Self-pay | Admitting: Internal Medicine

## 2019-03-18 ENCOUNTER — Other Ambulatory Visit (INDEPENDENT_AMBULATORY_CARE_PROVIDER_SITE_OTHER): Payer: Medicare PPO

## 2019-03-18 ENCOUNTER — Other Ambulatory Visit: Payer: Self-pay

## 2019-03-18 DIAGNOSIS — R739 Hyperglycemia, unspecified: Secondary | ICD-10-CM | POA: Diagnosis not present

## 2019-03-18 DIAGNOSIS — E7800 Pure hypercholesterolemia, unspecified: Secondary | ICD-10-CM

## 2019-03-18 DIAGNOSIS — I10 Essential (primary) hypertension: Secondary | ICD-10-CM | POA: Diagnosis not present

## 2019-03-18 LAB — CBC WITH DIFFERENTIAL/PLATELET
Basophils Absolute: 0 10*3/uL (ref 0.0–0.1)
Basophils Relative: 0.5 % (ref 0.0–3.0)
Eosinophils Absolute: 0.1 10*3/uL (ref 0.0–0.7)
Eosinophils Relative: 3.5 % (ref 0.0–5.0)
HCT: 42.9 % (ref 36.0–46.0)
Hemoglobin: 14.7 g/dL (ref 12.0–15.0)
Lymphocytes Relative: 31.9 % (ref 12.0–46.0)
Lymphs Abs: 1.2 10*3/uL (ref 0.7–4.0)
MCHC: 34.2 g/dL (ref 30.0–36.0)
MCV: 89.8 fl (ref 78.0–100.0)
Monocytes Absolute: 0.3 10*3/uL (ref 0.1–1.0)
Monocytes Relative: 7 % (ref 3.0–12.0)
Neutro Abs: 2.2 10*3/uL (ref 1.4–7.7)
Neutrophils Relative %: 57.1 % (ref 43.0–77.0)
Platelets: 237 10*3/uL (ref 150.0–400.0)
RBC: 4.78 Mil/uL (ref 3.87–5.11)
RDW: 13.5 % (ref 11.5–15.5)
WBC: 3.8 10*3/uL — ABNORMAL LOW (ref 4.0–10.5)

## 2019-03-18 LAB — HEMOGLOBIN A1C: Hgb A1c MFr Bld: 6.7 % — ABNORMAL HIGH (ref 4.6–6.5)

## 2019-03-18 LAB — BASIC METABOLIC PANEL
BUN: 13 mg/dL (ref 6–23)
CO2: 27 mEq/L (ref 19–32)
Calcium: 9.6 mg/dL (ref 8.4–10.5)
Chloride: 105 mEq/L (ref 96–112)
Creatinine, Ser: 0.87 mg/dL (ref 0.40–1.20)
GFR: 63.31 mL/min (ref >60.00)
Glucose, Bld: 116 mg/dL — ABNORMAL HIGH (ref 70–99)
Potassium: 3.7 mEq/L (ref 3.5–5.1)
Sodium: 139 mEq/L (ref 135–145)

## 2019-03-18 LAB — LIPID PANEL
Cholesterol: 171 mg/dL (ref 0–200)
HDL: 45.2 mg/dL (ref >39.00)
LDL Cholesterol: 102 mg/dL — ABNORMAL HIGH (ref 0–99)
NonHDL: 125.61
Total CHOL/HDL Ratio: 4
Triglycerides: 120 mg/dL (ref 0.0–149.0)
VLDL: 24 mg/dL (ref 0.0–40.0)

## 2019-03-18 LAB — HEPATIC FUNCTION PANEL
ALT: 21 U/L (ref 0–35)
AST: 23 U/L (ref 0–37)
Albumin: 4.6 g/dL (ref 3.5–5.2)
Alkaline Phosphatase: 95 U/L (ref 39–117)
Bilirubin, Direct: 0.2 mg/dL (ref 0.0–0.3)
Total Bilirubin: 1 mg/dL (ref 0.2–1.2)
Total Protein: 7.2 g/dL (ref 6.0–8.3)

## 2019-03-18 NOTE — Telephone Encounter (Signed)
Pt dropped off Covid-19 vaccination record card. Placed in folder to be put in Dr. Bary Leriche inbox.

## 2019-03-19 ENCOUNTER — Ambulatory Visit: Payer: Medicare PPO | Admitting: Physical Therapy

## 2019-03-20 NOTE — Telephone Encounter (Signed)
Chart updated

## 2019-03-23 ENCOUNTER — Encounter: Payer: Self-pay | Admitting: Physical Therapy

## 2019-03-23 ENCOUNTER — Other Ambulatory Visit: Payer: Self-pay

## 2019-03-23 ENCOUNTER — Other Ambulatory Visit: Payer: Self-pay | Admitting: Internal Medicine

## 2019-03-23 ENCOUNTER — Ambulatory Visit: Payer: Medicare PPO | Admitting: Physical Therapy

## 2019-03-23 DIAGNOSIS — M25611 Stiffness of right shoulder, not elsewhere classified: Secondary | ICD-10-CM

## 2019-03-23 DIAGNOSIS — M6281 Muscle weakness (generalized): Secondary | ICD-10-CM | POA: Diagnosis not present

## 2019-03-23 DIAGNOSIS — M25511 Pain in right shoulder: Secondary | ICD-10-CM

## 2019-03-23 DIAGNOSIS — D72819 Decreased white blood cell count, unspecified: Secondary | ICD-10-CM

## 2019-03-23 NOTE — Progress Notes (Signed)
Order placed for f/u cbc.   

## 2019-03-23 NOTE — Therapy (Signed)
[facility] [address] [place], [address] Phone: [phone]   Fax:  [phone]  Physical Therapy Treatment  Patient Details  Name: Kaitlin Dean MRN: [id number] Date of Birth: [birth year] Referring Provider (PT): Alveda Reasons, MD   Encounter Date: 03/23/2019  PT End of Session - 03/23/19 1357    Visit Number  11    Number of Visits  27    Date for PT Re-Evaluation  05/12/19    Authorization Type  [employer] medicare reporting from 03/17/2019    Authorization - Visit Number  1    Authorization - Number of Visits  10    PT Start Time  1300    PT Stop Time  1340    PT Time Calculation (min)  40 min    Activity Tolerance  Patient tolerated treatment well    Behavior During Therapy  [facility] for tasks assessed/performed       Past Medical History:  Diagnosis Date  . 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 Barrett's esophagus   . Chicken pox   . Complication of anesthesia    woke up 15 years ago and felt like could not breathe - no complications   . Diverticulosis of colon without hemorrhage 02/28/2014  . GERD (gastroesophageal reflux disease)   . History of fractured kneecap   . History of fractured pelvis   . History of hiatal hernia   . Hypercholesterolemia   . Hypertension   . Osteoarthritis   . Osteoporosis   . Pneumonia    in past  . 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 Shoulder fracture, right 11/07/2018  . Skin cancer     Past Surgical History:  Procedure Laterality Date  . BREAST EXCISIONAL BIOPSY Right 2000   benign  . BREAST SURGERY  2000   Biopsy  . CATARACT EXTRACTION W/PHACO Right 12/05/2016   Procedure: CATARACT EXTRACTION PHACO AND INTRAOCULAR LENS PLACEMENT ([place]) RIGHT SYMFONY LENS;  Surgeon: Leandrew Koyanagi, MD;  Location: [facility];  Service: Ophthalmology;  Laterality: Right;  . CATARACT EXTRACTION W/PHACO Left 02/06/2017   Procedure:  CATARACT EXTRACTION PHACO AND INTRAOCULAR LENS PLACEMENT ([place]) LEFT SYMFONY LENS;  Surgeon: Leandrew Koyanagi, MD;  Location: [facility];  Service: Ophthalmology;  Laterality: Left;  . COLONOSCOPY    . ESOPHAGOGASTRODUODENOSCOPY    . ESOPHAGOGASTRODUODENOSCOPY (EGD) WITH PROPOFOL N/A 06/11/2016   Procedure: ESOPHAGOGASTRODUODENOSCOPY (EGD) WITH PROPOFOL;  Surgeon: Manya Silvas, MD;  Location: [facility];  Service: Endoscopy;  Laterality: N/A;  . JOINT REPLACEMENT Left    knee  . PTOSIS REPAIR Bilateral 06/01/2014   Procedure: PTOSIS REPAIR;  Surgeon: Karle Starch, MD;  Location: [facility];  Service: Ophthalmology;  Laterality: Bilateral;  . SKIN CANCER EXCISION    . TOTAL KNEE ARTHROPLASTY Left 04/30/2012   Procedure: TOTAL KNEE ARTHROPLASTY;  Surgeon: Gearlean Alf, MD;  Location: [facility];  Service: Orthopedics;  Laterality: Left;  . TUBAL LIGATION      There were no vitals filed for this visit.  Subjective Assessment - 03/23/19 1316    Subjective  Patient reports she is feeling pretty good today and is no having a lot of pain. She states she was not sore following last treatment session due to not doing much. Has not started using her pullys again.    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 osteoporosis    Limitations  Other (comment)   Functional limitations: cannot fix hair or reach top of head in the back, reaching front, back, side, lifting above shoulder, cleaning house, swim backstroke, golfing.   Diagnostic tests  radiograph showed fracture of R proximal humerous, more recent radiograph show good healing per pt     Patient Stated Goals  "to get my arms back like they used to be" "would like to be able to swim backstroke"  Wants to be able to raise R arm completely up and also has pain with extension/internal rotation that she wants to go away.    Currently in Pain?  No/denies    Pain Onset  More than a month ago       TREATMENT:   Manual therapy: to reduce pain and tissue tension, improve range of motion, neuromodulation, in order to promote improved ability to complete functional activities. - supine PROM R shoulder flexion, abduction, IR, ER with overpressure as tolerated x 10-20 each directions to improve motion.  - STM to R shoulder musculature, focusing on subscapularis - R GH joint mobs grade III-IV as tolerated, AP, caudal, distraction in neutral and max abduction.  - Education on diagnosis, prognosis, POC, anatomy and physiology of current condition.   Therapeutic exercise: to centralize symptoms and improve ROM, strength, muscular endurance, and activity tolerance required for successful completion of functional activities.   Hooklying AAROM focusing on R shoulder, using PVC stick, instructed for 5 second holds into temporary discomfort, pt holding about 1 second:  - flexion x 20 - abduction x 10 - ER x 10  Standing AAROM for R shoulder, instructed for 5 second holds into temporary discomfort, pt holding about 1 second:  - shoulder extension c PVC stick x 10 - attempted shoulder abduction c PVC stick x 3, but painful and less effective than hooklying.  - IR behind back with variations using towel, stick, and across pelvis or up back, discontinued after about 10 reps per pt request due to pain (calmed within 5 min).   Education on HEP including handout    HOME EXERCISE PROGRAM Access Code: [email or web address]  URL: [email or web address]  Date: 03/23/2019  Prepared by: Rosita Kea   Exercises Supine Shoulder Flexion with Dowel - 1-2 sets - 20 reps - 5 seconds hold - 1x daily - 7x  weekly Supine Shoulder Abduction AAROM with Dowel - 1-2 sets - 20 reps - 5 seconds hold - 1x daily - 7x weekly Supine Shoulder External Rotation with Dowel - 1-2 sets - 20 reps - 5 seconds hold - 1x daily - 7x weekly Standing Shoulder Extension AAROM with Dowel - 1-2 sets - 20 reps - 5 seconds hold - 1x daily - 7x weekly Standing Shoulder Internal Rotation Stretch with Towel - 1-2 sets - 20 reps - 5 seconds hold - 7x daily - 7x weekly Standing Shoulder Internal Rotation AAROM with Dowel - 1-2 sets - 20 reps - 5 seconds hold - 1x daily - 7x weekly    PT Education - 03/23/19 1356    Education Details  Exercise purpose/form. Self management techniques. HEP    Person(s) Educated  Patient    Methods  Explanation;Demonstration;Tactile cues;Verbal cues;Handout    Comprehension  Verbalized understanding;Returned demonstration;Verbal cues  required;Tactile cues required;Need further instruction       PT Short Term Goals - 03/17/19 1940      PT SHORT TERM GOAL #1   Title  Be independent with initial home exercise program for self-management of symptoms.    Baseline  initial HEP provided at IE (12/16/2018)    Time  2    Period  Weeks    Status  Revised    Target Date  03/31/19        PT Long Term Goals - 03/17/19 1940      PT LONG TERM GOAL #1   Title  Be independent with a long-term home exercise program for self-management of symptoms    Baseline  Initial HEP provided at [facility] (12/16/2018);    Time  8    Period  Weeks    Status  Revised    Target Date  05/12/19      PT LONG TERM GOAL #2   Title  emonstrate improved FOTO score to equal or greater than 73 to demonstrate improvement in overall condition and self-reported functional ability.    Baseline  FOTO = 55 (12/16/2018); FOTO = 71 (03/17/2019);    Time  8    Period  Weeks    Status  Revised    Target Date  05/12/19      PT LONG TERM GOAL #3   Title  Demonstrate R shoulder AROM equal to or greater than L shoulder AROM with 1/10  or less pain in all planes to allow patient to complete valued activities with less difficulty.    Baseline  see objective measures (12/16/2018); continues to be limited but is better than at initial eval - see objective exam (03/17/2019);    Time  8    Period  Weeks    Status  Partially Met    Target Date  05/12/19      PT LONG TERM GOAL #4   Title  Demonstrate R shoulder MMT 4/5 or greater to improve ability to perform reaching, carrying, and lifting tasks.    Baseline  testing deferred due to precautions (12/16/2018); see testing in objective exam (03/17/2019);    Time  8    Period  Weeks    Status  Partially Met    Target Date  05/12/19      PT LONG TERM GOAL #5   Title  Complete community, work and/or recreational activities without limitation due to current condition.    Baseline  Functional limitations: cannot fix hair or reach top of head in the back, was restricted in eating with R UE, driving, pushing, seatbelt, reaching, lifting above shoulder, dressing and bathing, gardening, cleaning house, swim backstroke, cooking (12/16/2018); cannot fix hair or reach top of head in the back, reaching front, back, side, lifting above shoulder, cleaning house, swim backstroke, golfing, grooming (03/17/2019);    Time  8    Period  Weeks    Status  Partially Met    Target Date  05/12/19      Additional Long Term Goals   Additional Long Term Goals  Yes      PT LONG TERM GOAL #6   Title  Patient will report improvement in PSFS to at least 9/10 in each category (golf, swimming, combing hair in the back), to demonstrate improved function in areas that patient values.    Baseline  swimming = 6/10, golf 7/10, combing hair in back = 7/10 (03/17/2019);    Time  8  Period  Weeks    Status  New    Target Date  05/12/19            Plan - 03/23/19 1359    Clinical Impression Statement  Patient tolerated treatment well overall with some discomfort at end range motions of R shoulder. Continues  to be significantly limited in R shoulder A/PROM especially in abduction and IR. Is showing improvements in flexion and was able to relax more towards end of manual therapy. Patient would benefit from continued management of limiting condition by skilled physical therapist to address remaining impairments and functional limitations to work towards stated goals and return to PLOF or maximal functional independence.    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    Examination-Activity Limitations  Bathing;Lift;Reach Overhead;Carry;Dressing;Hygiene/Grooming;Self Feeding;Other   cannot fix hair or reach top of head in the back, was restricted in eating with R UE, driving, pushing, seatbelt, reaching, lifting above shoulder, dressing and bathing, gardening, cleaning house, swim backstroke, cooking.   Examination-Participation Restrictions  Community Activity;Driving;Laundry;Meal Prep;Yard Work    Stability/Clinical Decision Making  Stable/Uncomplicated    Rehab Potential  Good    PT Frequency  2x / week    PT Duration  8 weeks    PT Treatment/Interventions  ADLs/Self Care Home Management;Aquatic Therapy;Electrical Stimulation;Cryotherapy;Moist Heat;Functional mobility training;Therapeutic activities;Therapeutic exercise;Neuromuscular re-education;Patient/family education;Manual techniques;Passive range of motion;Dry needling;Spinal Manipulations;Joint Manipulations    PT Next Visit Plan  ROM exercise and strengthening for the R shoulder    PT Home Exercise Plan  Medbridge Access  Code: [email or web address]    Consulted and Agree with Plan of Care  Patient       Patient will benefit from skilled therapeutic intervention in order to improve the following deficits and impairments:  Increased fascial restricitons, Pain, Improper body mechanics, Postural dysfunction, Increased muscle spasms, Decreased mobility, Decreased coordination, Decreased activity tolerance, Decreased endurance, Decreased range of motion, Decreased strength, Hypomobility, Impaired perceived functional ability, Impaired UE functional use, Impaired flexibility, Decreased knowledge of precautions  Visit Diagnosis: Acute pain of right shoulder  Stiffness of right shoulder, not elsewhere classified  Muscle weakness (generalized)     Problem List Patient Active Problem List   Diagnosis Date Noted  . Humerus fracture 02/24/2019  . Aortic atherosclerosis ([place]) 08/28/2016  . Throat irritation 02/20/2015  . Hyperglycemia 09/01/2014  . Health care maintenance 08/07/2014  . Diverticulosis of colon without hemorrhage 02/28/2014  . Breast pain, right 11/12/2013  . Breast thickening 11/12/2013  . Irregular heart rhythm 02/22/2013  . OA (osteoarthritis) of knee 04/30/2012  . Skin cancer 02/10/2012  . Hypertension 02/04/2012  . Hypercholesterolemia 02/04/2012  . Hiatal hernia with GERD 02/04/2012  . History of HPV infection 02/04/2012  . Barrett's esophagus 02/04/2012    Everlean Alstrom. Graylon Good, PT, DPT 03/23/19, 1:59 PM  [facility] [address], [place], [address] Phone: [phone]   Fax:  [phone]  Name: CLARA HERBISON MRN: [id number] Date of Birth: 03/01/1945

## 2019-03-24 ENCOUNTER — Other Ambulatory Visit: Payer: Self-pay | Admitting: Internal Medicine

## 2019-03-24 DIAGNOSIS — E1165 Type 2 diabetes mellitus with hyperglycemia: Secondary | ICD-10-CM | POA: Insufficient documentation

## 2019-03-24 DIAGNOSIS — E118 Type 2 diabetes mellitus with unspecified complications: Secondary | ICD-10-CM

## 2019-03-24 NOTE — Progress Notes (Signed)
Order placed for referral to [facility]

## 2019-03-26 ENCOUNTER — Other Ambulatory Visit: Payer: Self-pay

## 2019-03-26 ENCOUNTER — Encounter: Payer: Self-pay | Admitting: Physical Therapy

## 2019-03-26 ENCOUNTER — Ambulatory Visit: Payer: Medicare PPO | Admitting: Physical Therapy

## 2019-03-26 DIAGNOSIS — M25611 Stiffness of right shoulder, not elsewhere classified: Secondary | ICD-10-CM | POA: Diagnosis not present

## 2019-03-26 DIAGNOSIS — M25511 Pain in right shoulder: Secondary | ICD-10-CM

## 2019-03-26 DIAGNOSIS — M6281 Muscle weakness (generalized): Secondary | ICD-10-CM | POA: Diagnosis not present

## 2019-03-26 NOTE — Therapy (Signed)
[facility] [address] [place], [address] Phone: [phone]   Fax:  [phone]  Physical Therapy Treatment  Patient Details  Name: Kaitlin Dean MRN: [id number] Date of Birth: 1945-06-07 Referring Provider (PT): Alveda Reasons, MD   Encounter Date: 03/26/2019  PT End of Session - 03/26/19 1849    Visit Number  12    Number of Visits  27    Date for PT Re-Evaluation  05/12/19    Authorization Type  [employer] medicare reporting from 03/17/2019    Authorization - Visit Number  2    Authorization - Number of Visits  10    PT Start Time  8938    PT Stop Time  1515    PT Time Calculation (min)  41 min    Activity Tolerance  Patient tolerated treatment well    Behavior During Therapy  [facility] for tasks assessed/performed       Past Medical History:  Diagnosis Date  . 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 Barrett's esophagus   . Chicken pox   . Complication of anesthesia    woke up 15 years ago and felt like could not breathe - no complications   . Diverticulosis of colon without hemorrhage 02/28/2014  . GERD (gastroesophageal reflux disease)   . History of fractured kneecap   . History of fractured pelvis   . History of hiatal hernia   . Hypercholesterolemia   . Hypertension   . Osteoarthritis   . Osteoporosis   . Pneumonia    in past  . 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 Shoulder fracture, right 11/07/2018  . Skin cancer     Past Surgical History:  Procedure Laterality Date  . BREAST EXCISIONAL BIOPSY Right 2000   benign  . BREAST SURGERY  2000   Biopsy  . CATARACT EXTRACTION W/PHACO Right 12/05/2016   Procedure: CATARACT EXTRACTION PHACO AND INTRAOCULAR LENS PLACEMENT ([place]) RIGHT SYMFONY LENS;  Surgeon: Leandrew Koyanagi, MD;  Location: [facility];  Service: Ophthalmology;  Laterality: Right;  . CATARACT EXTRACTION W/PHACO Left 02/06/2017   Procedure:  CATARACT EXTRACTION PHACO AND INTRAOCULAR LENS PLACEMENT ([place]) LEFT SYMFONY LENS;  Surgeon: Leandrew Koyanagi, MD;  Location: [facility];  Service: Ophthalmology;  Laterality: Left;  . COLONOSCOPY    . ESOPHAGOGASTRODUODENOSCOPY    . ESOPHAGOGASTRODUODENOSCOPY (EGD) WITH PROPOFOL N/A 06/11/2016   Procedure: ESOPHAGOGASTRODUODENOSCOPY (EGD) WITH PROPOFOL;  Surgeon: Manya Silvas, MD;  [facility];  Service: Endoscopy;  Laterality: N/A;  . JOINT REPLACEMENT Left    knee  . PTOSIS REPAIR Bilateral 06/01/2014   Procedure: PTOSIS REPAIR;  Surgeon: Karle Starch, MD;  Location: [facility];  Service: Ophthalmology;  Laterality: Bilateral;  . SKIN CANCER EXCISION    . TOTAL KNEE ARTHROPLASTY Left 04/30/2012   Procedure: TOTAL KNEE ARTHROPLASTY;  Surgeon: Gearlean Alf, MD;  Location: [facility];  Service: Orthopedics;  Laterality: Left;  . TUBAL LIGATION      There were no vitals filed for this visit.  Subjective Assessment - 03/26/19 1847    Subjective  Patient reports she is feeling well today. No pain upon arrival. States she has done her HEP a couple of times and had end range pain during but it calmed down after as we had discussed previously as her goal response to exercises.    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 osteoporosis    Limitations  Other (comment)   Functional limitations: cannot fix hair or reach top of head in the back, reaching front, back, side, lifting above shoulder, cleaning house, swim backstroke, golfing.   Diagnostic tests  radiograph showed fracture of R proximal humerous, more recent radiograph  show good healing per pt    Patient Stated Goals  "to get my arms back like they used to be" "would like to be able to swim backstroke"  Wants to be able to raise R arm completely up and also has pain with extension/internal rotation that she wants to go away.    Currently in Pain?  No/denies    Pain Onset  More than a month ago       TREATMENT:  Manual therapy:to reduce pain and tissue tension, improve range of motion, neuromodulation, in order to promote improved ability to complete functional activities. - supine PROM R shoulder flexion, abduction, IR, ER with overpressure as tolerated x 20 each directions to improve motion.  - R GH joint mobs grade III-IV as tolerated, AP, caudal, distraction in neutral and max abduction.   Therapeutic exercise:to centralize symptoms and improve ROM, strength, muscular endurance, and activity tolerance required for successful completion of functional activities.   Hooklying AAROM focusing on R shoulder, using PVC stick, instructed for 5 second holds into temporary discomfort, pt holding about 1 second:  - flexion with 5# on stick 2x 10 (139) - abduction x  X 3 but too difficult to perform correctly, sub for pullys at home.  - ER with towel bolster x 20  - chest press with serratus plus, 1 second hold, 5# weight on stick, x 20 - Education on HEP including handout    HOME EXERCISE PROGRAM Access Code: [email or web address]  URL: [email or web address]  Date: 03/26/2019  Prepared by: Rosita Kea   Exercises Supine Shoulder Flexion with Dowel - 1-2 sets - 20 reps - 5 seconds hold - 1x daily - 7x weekly Supine Shoulder External Rotation in 45 Degrees Abduction AAROM with Dowel - 1-2 sets - 20 reps - 5 seconds hold - 1x daily - 7x weekly Seated Shoulder Abduction AAROM with Pulley Behind - 1-2 sets - 20 reps - 5 seconds hold - 1x daily - 7x weekly Standing Shoulder Extension AAROM with Dowel - 1-2 sets - 20 reps - 5 seconds hold - 1x daily - 7x  weekly Standing Shoulder Internal Rotation Stretch with Towel - 1-2 sets - 20 reps - 5 seconds hold - 7x daily - 7x weekly Standing Shoulder Internal Rotation AAROM with Dowel - 1-2 sets  - 20 reps - 5 seconds hold - 1x daily - 7x weekly    PT Education - 03/26/19 1849    Education Details  Exercise purpose/form. Self management techniques. HEP    Person(s) Educated  Patient    Methods  Explanation;Demonstration;Tactile cues;Verbal cues;Handout    Comprehension  Verbalized understanding;Returned demonstration;Verbal cues required;Tactile cues required;Need further instruction       PT Short Term Goals - 03/17/19 1940      PT SHORT TERM GOAL #1   Title  Be independent with initial home exercise program for self-management of symptoms.    Baseline  initial HEP provided  at IE (12/16/2018)    Time  2    Period  Weeks    Status  Revised    Target Date  03/31/19        PT Long Term Goals - 03/17/19 1940      PT LONG TERM GOAL #1   Title  Be independent with a long-term home exercise program for self-management of symptoms    Baseline  Initial HEP provided at IE (12/16/2018);    Time  8    Period  Weeks    Status  Revised    Target Date  05/12/19      PT LONG TERM GOAL #2   Title  emonstrate improved FOTO score to equal or greater than 73 to demonstrate improvement in overall condition and self-reported functional ability.    Baseline  FOTO = 55 (12/16/2018); FOTO = 71 (03/17/2019);    Time  8    Period  Weeks    Status  Revised    Target Date  05/12/19      PT LONG TERM GOAL #3   Title  Demonstrate R shoulder AROM equal to or greater than L shoulder AROM with 1/10 or less pain in all planes to allow patient to complete valued activities with less difficulty.    Baseline  see objective measures (12/16/2018); continues to be limited but is better than at initial eval - see objective exam (03/17/2019);    Time  8    Period  Weeks    Status  Partially Met    Target Date   05/12/19      PT LONG TERM GOAL #4   Title  Demonstrate R shoulder MMT 4/5 or greater to improve ability to perform reaching, carrying, and lifting tasks.    Baseline  testing deferred due to precautions (12/16/2018); see testing in objective exam (03/17/2019);    Time  8    Period  Weeks    Status  Partially Met    Target Date  05/12/19      PT LONG TERM GOAL #5   Title  Complete community, work and/or recreational activities without limitation due to current condition.    Baseline  Functional limitations: cannot fix hair or reach top of head in the back, was restricted in eating with R UE, driving, pushing, seatbelt, reaching, lifting above shoulder, dressing and bathing, gardening, cleaning house, swim backstroke, cooking (12/16/2018); cannot fix hair or reach top of head in the back, reaching front, back, side, lifting above shoulder, cleaning house, swim backstroke, golfing, grooming (03/17/2019);    Time  8    Period  Weeks    Status  Partially Met    Target Date  05/12/19      Additional Long Term Goals   Additional Long Term Goals  Yes      PT LONG TERM GOAL #6   Title  Patient will report improvement in PSFS to at least 9/10 in each category (golf, swimming, combing hair in the back), to demonstrate improved function in areas that patient values.    Baseline  swimming = 6/10, golf 7/10, combing hair in back = 7/10 (03/17/2019);    Time  8    Period  Weeks    Status  New    Target Date  05/12/19            Plan - 03/26/19 1853    Clinical Impression Statement  Patient tolerated treatment well overall and continued to demonstrate limitations at  end range R shoulder motion, especially in abduction. Reviewed and updated HEP and updated/replaced exercises she was having trouble with at home. Patient demonstrates mildly improved PROM following manual per observation. Patient would benefit from continued management of limiting condition by skilled physical therapist to address  remaining impairments and functional limitations to work towards stated goals and return to PLOF or maximal functional independence.    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    Examination-Activity Limitations  Bathing;Lift;Reach Overhead;Carry;Dressing;Hygiene/Grooming;Self Feeding;Other   cannot fix hair or reach top of head in the back, was restricted in eating with R UE, driving, pushing, seatbelt, reaching, lifting above shoulder, dressing and bathing, gardening, cleaning house, swim backstroke, cooking.   Examination-Participation Restrictions  Community Activity;Driving;Laundry;Meal Prep;Yard Work    Stability/Clinical Decision Making  Stable/Uncomplicated    Rehab Potential  Good    PT Frequency  2x / week    PT Duration  8 weeks    PT Treatment/Interventions  ADLs/Self Care Home Management;Aquatic Therapy;Electrical Stimulation;Cryotherapy;Moist Heat;Functional mobility training;Therapeutic activities;Therapeutic exercise;Neuromuscular re-education;Patient/family education;Manual techniques;Passive range of motion;Dry needling;Spinal Manipulations;Joint Manipulations    PT Next Visit Plan  ROM exercise and strengthening for the R shoulder    PT Home Exercise Plan  Medbridge Access Code: [email or web address]    Consulted and Agree with Plan of Care  Patient       Patient will benefit from skilled therapeutic intervention in order to improve the following deficits and impairments:  Increased fascial restricitons, Pain, Improper body mechanics, Postural dysfunction,  Increased muscle spasms, Decreased mobility, Decreased coordination, Decreased activity tolerance, Decreased endurance, Decreased range of motion, Decreased strength, Hypomobility, Impaired perceived functional ability, Impaired UE functional use, Impaired flexibility, Decreased knowledge of precautions  Visit Diagnosis: Acute pain of right shoulder  Stiffness of right shoulder, not elsewhere classified  Muscle weakness (generalized)     Problem List Patient Active Problem List   Diagnosis Date Noted  . Controlled diabetes mellitus type 2 with complications ([place]) 33/35/4562  . Humerus fracture 02/24/2019  . Aortic atherosclerosis ([place]) 08/28/2016  . Throat irritation 02/20/2015  . Hyperglycemia 09/01/2014  . Health care maintenance 08/07/2014  . Diverticulosis of colon without hemorrhage 02/28/2014  . Breast pain, right 11/12/2013  . Breast thickening 11/12/2013  . Irregular heart rhythm 02/22/2013  . OA (osteoarthritis) of knee 04/30/2012  . Skin cancer 02/10/2012  . Hypertension 02/04/2012  . Hypercholesterolemia 02/04/2012  . Hiatal hernia with GERD 02/04/2012  . History of HPV infection 02/04/2012  . Barrett's esophagus 02/04/2012    Everlean Alstrom. Graylon Good, PT, DPT 03/26/19, 6:54 PM  [facility] [address] [place], [address] Phone: [phone]   Fax:  [phone]  Name: Kaitlin Dean MRN: [id number] Date of Birth: 03-30-1945

## 2019-03-30 ENCOUNTER — Ambulatory Visit: Payer: Medicare PPO | Admitting: Physical Therapy

## 2019-03-31 ENCOUNTER — Ambulatory Visit: Payer: Medicare PPO | Attending: Orthopedic Surgery | Admitting: Physical Therapy

## 2019-03-31 ENCOUNTER — Encounter: Payer: Self-pay | Admitting: Physical Therapy

## 2019-03-31 ENCOUNTER — Other Ambulatory Visit: Payer: Self-pay

## 2019-03-31 DIAGNOSIS — M6281 Muscle weakness (generalized): Secondary | ICD-10-CM | POA: Insufficient documentation

## 2019-03-31 DIAGNOSIS — M25611 Stiffness of right shoulder, not elsewhere classified: Secondary | ICD-10-CM | POA: Diagnosis not present

## 2019-03-31 DIAGNOSIS — M25511 Pain in right shoulder: Secondary | ICD-10-CM | POA: Diagnosis not present

## 2019-03-31 NOTE — Therapy (Signed)
[facility] [address] [place], [address] Phone: [phone]   Fax:  [phone]  Physical Therapy Treatment  Patient Details  Name: Kaitlin Dean MRN: [id number] Date of Birth: 06-24-1945 Referring Provider (PT): Alveda Reasons, MD   Encounter Date: 03/31/2019  PT End of Session - 03/31/19 1313    Visit Number  13    Number of Visits  27    Date for PT Re-Evaluation  05/12/19    Authorization Type  [employer] medicare reporting from 03/17/2019    Authorization Time Period  Craig Staggers [id number] 16 visits 03/23/19 - 05/12/19    Authorization - Visit Number  3    Authorization - Number of Visits  16    Progress Note Due on Visit  20    PT Start Time  0347    PT Stop Time  1341    PT Time Calculation (min)  38 min    Activity Tolerance  Patient tolerated treatment well    Behavior During Therapy  [facility] for tasks assessed/performed       Past Medical History:  Diagnosis Date  . 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 Barrett's esophagus   . Chicken pox   . Complication of anesthesia    woke up 15 years ago and felt like could not breathe - no complications   . Diverticulosis of colon without hemorrhage 02/28/2014  . GERD (gastroesophageal reflux disease)   . History of fractured kneecap   . History of fractured pelvis   . History of hiatal hernia   . Hypercholesterolemia   . Hypertension   . Osteoarthritis   . Osteoporosis   . Pneumonia    in past  . 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 Shoulder fracture, right 11/07/2018  . Skin cancer     Past Surgical History:  Procedure Laterality Date  . BREAST EXCISIONAL BIOPSY Right 2000   benign  . BREAST SURGERY  2000   Biopsy  . CATARACT EXTRACTION W/PHACO Right 12/05/2016   Procedure: CATARACT EXTRACTION PHACO AND INTRAOCULAR LENS PLACEMENT ([place]) RIGHT SYMFONY LENS;  Surgeon: Leandrew Koyanagi, MD;  Location: [facility];  Service: Ophthalmology;  Laterality: Right;  . CATARACT EXTRACTION W/PHACO Left 02/06/2017   Procedure: CATARACT EXTRACTION PHACO AND INTRAOCULAR LENS PLACEMENT ([place]) LEFT SYMFONY LENS;  Surgeon: Leandrew Koyanagi, MD;  Location: [place];  Service: Ophthalmology;  Laterality: Left;  . COLONOSCOPY    . ESOPHAGOGASTRODUODENOSCOPY    . ESOPHAGOGASTRODUODENOSCOPY (EGD) WITH PROPOFOL N/A 06/11/2016   Procedure: ESOPHAGOGASTRODUODENOSCOPY (EGD) WITH PROPOFOL;  Surgeon: Manya Silvas, MD;  Location: [facility];  Service: Endoscopy;  Laterality: N/A;  . JOINT REPLACEMENT Left    knee  . PTOSIS REPAIR Bilateral 06/01/2014   Procedure: PTOSIS REPAIR;  Surgeon: Karle Starch, MD;  Location: [facility];  Service: Ophthalmology;  Laterality: Bilateral;  . SKIN CANCER EXCISION    . TOTAL KNEE ARTHROPLASTY Left 04/30/2012   Procedure: TOTAL KNEE ARTHROPLASTY;  Surgeon: Gearlean Alf, MD;  Location: [facility];  Service: Orthopedics;  Laterality: Left;  . TUBAL LIGATION      There were no vitals filed for this visit.  Subjective Assessment - 03/31/19 1308    Subjective  Patient reports no pain upon arrival. States her shoulder is getting better and she is able to sleep better. She has been doing her HEP regularly and has no questions about it.  She did a lot of vaccuming over the weekend successfully.    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 osteoporosis    Limitations  Other (comment)   Functional limitations: cannot fix hair or reach top of head in the back, reaching front, back, side, lifting above shoulder, cleaning house,  swim backstroke, golfing.   Diagnostic tests  radiograph showed fracture of R proximal humerous, more recent radiograph show good healing per pt    Patient Stated Goals  "to get my arms back like they used to be" "would like to be able to swim backstroke"  Wants to be able to raise R arm completely up and also has pain with extension/internal rotation that she wants to go away.    Currently in Pain?  No/denies    Pain Onset  More than a month ago       TREATMENT:  Manual therapy:to reduce pain and tissue tension, improve range of motion, neuromodulation, in order to promote improved ability to complete functional activities. - supine PROM R shoulder flexion, abduction, IR, ER with overpressure as tolerated x20each directions to improve motion.  - R GH joint mobs grade III-IV as tolerated, AP, caudal, distraction in neutral and max abduction.  Therapeutic exercise:to centralize symptoms and improve ROM, strength, muscular endurance, and activity tolerance required for successful completion of functional activities.  Hooklying AAROM focusing on R shoulder, using PVC stick, instructed for 5 second holds into temporary discomfort, pt holding about 1 second:  - flexion with 5# on stick 2x 10 - ER with towel bolster x 20   - chest press with plus 4# DB in each hand, 1 second hold, x20  - Supine R ER/IR with towel bolster under upper arm, with 3# DB moving to end range with active advancement into restriction x 20 each direction.   HOME EXERCISE PROGRAM Access Code: [email or web address]        URL: [email or web address]    Date: 03/26/2019  Prepared by: Rosita Kea   Exercises Supine Shoulder Flexion with Dowel - 1-2 sets - 20 reps - 5 seconds hold - 1x daily - 7x weekly Supine Shoulder External Rotation in 45 Degrees Abduction AAROM with Dowel - 1-2 sets - 20 reps - 5 seconds hold - 1x daily - 7x weekly Seated Shoulder Abduction AAROM with Pulley Behind - 1-2 sets - 20 reps - 5  seconds hold - 1x daily - 7x weekly Standing Shoulder Extension AAROM with Dowel - 1-2 sets - 20 reps - 5 seconds hold - 1x daily - 7x weekly Standing Shoulder Internal Rotation Stretch with Towel - 1-2 sets - 20 reps - 5 seconds hold - 7x daily - 7x weekly Standing Shoulder Internal Rotation AAROM with Dowel - 1-2 sets             - 20 reps - 5 seconds hold - 1x daily - 7x weekly    PT Education - 04/01/19 2006    Education Details  Exercise purpose/form. Self management techniques.    Person(s) Educated  Patient    Methods  Explanation;Demonstration;Tactile cues;Verbal cues    Comprehension  Verbalized understanding;Returned demonstration;Verbal cues required;Tactile cues required;Need further instruction       PT  Short Term Goals - 03/17/19 1940      PT SHORT TERM GOAL #1   Title  Be independent with initial home exercise program for self-management of symptoms.    Baseline  initial HEP provided at IE (12/16/2018)    Time  2    Period  Weeks    Status  Revised    Target Date  03/31/19        PT Long Term Goals - 03/17/19 1940      PT LONG TERM GOAL #1   Title  Be independent with a long-term home exercise program for self-management of symptoms    Baseline  Initial HEP provided at IE (12/16/2018);    Time  8    Period  Weeks    Status  Revised    Target Date  05/12/19      PT LONG TERM GOAL #2   Title  emonstrate improved FOTO score to equal or greater than 73 to demonstrate improvement in overall condition and self-reported functional ability.    Baseline  FOTO = 55 (12/16/2018); FOTO = 71 (03/17/2019);    Time  8    Period  Weeks    Status  Revised    Target Date  05/12/19      PT LONG TERM GOAL #3   Title  Demonstrate R shoulder AROM equal to or greater than L shoulder AROM with 1/10 or less pain in all planes to allow patient to complete valued activities with less difficulty.    Baseline  see objective measures (12/16/2018); continues to be limited but is better  than at initial eval - see objective exam (03/17/2019);    Time  8    Period  Weeks    Status  Partially Met    Target Date  05/12/19      PT LONG TERM GOAL #4   Title  Demonstrate R shoulder MMT 4/5 or greater to improve ability to perform reaching, carrying, and lifting tasks.    Baseline  testing deferred due to precautions (12/16/2018); see testing in objective exam (03/17/2019);    Time  8    Period  Weeks    Status  Partially Met    Target Date  05/12/19      PT LONG TERM GOAL #5   Title  Complete community, work and/or recreational activities without limitation due to current condition.    Baseline  Functional limitations: cannot fix hair or reach top of head in the back, was restricted in eating with R UE, driving, pushing, seatbelt, reaching, lifting above shoulder, dressing and bathing, gardening, cleaning house, swim backstroke, cooking (12/16/2018); cannot fix hair or reach top of head in the back, reaching front, back, side, lifting above shoulder, cleaning house, swim backstroke, golfing, grooming (03/17/2019);    Time  8    Period  Weeks    Status  Partially Met    Target Date  05/12/19      Additional Long Term Goals   Additional Long Term Goals  Yes      PT LONG TERM GOAL #6   Title  Patient will report improvement in PSFS to at least 9/10 in each category (golf, swimming, combing hair in the back), to demonstrate improved function in areas that patient values.    Baseline  swimming = 6/10, golf 7/10, combing hair in back = 7/10 (03/17/2019);    Time  8    Period  Weeks    Status  New  Target Date  05/12/19            Plan - 04/01/19 2010    Clinical Impression Statement  Patient tolerated treatment well with some end range discomfort that did not linger. Focused on improving functional AROM as well as improving PROM. Patient continues to report improvements in pain and function but is still limited by pain and weakness. 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    Examination-Activity Limitations  Bathing;Lift;Reach Overhead;Carry;Dressing;Hygiene/Grooming;Self Feeding;Other   cannot fix hair or reach top of head in the back, was restricted in eating with R UE, driving, pushing, seatbelt, reaching, lifting above shoulder, dressing and bathing, gardening, cleaning house, swim backstroke, cooking.   Examination-Participation Restrictions  Community Activity;Driving;Laundry;Meal Prep;Yard Work    Stability/Clinical Decision Making  Stable/Uncomplicated    Rehab Potential  Good    PT Frequency  2x / week    PT Duration  8 weeks    PT Treatment/Interventions  ADLs/Self Care Home Management;Aquatic Therapy;Electrical Stimulation;Cryotherapy;Moist Heat;Functional mobility training;Therapeutic activities;Therapeutic exercise;Neuromuscular re-education;Patient/family education;Manual techniques;Passive range of motion;Dry needling;Spinal Manipulations;Joint Manipulations    PT Next Visit Plan  ROM exercise and strengthening for the R shoulder    PT Home Exercise Plan  Medbridge Access Code: [email or web address]    Consulted and Agree with Plan of Care  Patient       Patient will benefit from skilled therapeutic intervention in order to improve the following deficits and impairments:  Increased fascial  restricitons, Pain, Improper body mechanics, Postural dysfunction, Increased muscle spasms, Decreased mobility, Decreased coordination, Decreased activity tolerance, Decreased endurance, Decreased range of motion, Decreased strength, Hypomobility, Impaired perceived functional ability, Impaired UE functional use, Impaired flexibility, Decreased knowledge of precautions  Visit Diagnosis: Acute pain of right shoulder  Stiffness of right shoulder, not elsewhere classified  Muscle weakness (generalized)     Problem List Patient Active Problem List   Diagnosis Date Noted  . Controlled diabetes mellitus type 2 with complications ([place]) 88/91/6945  . Humerus fracture 02/24/2019  . Aortic atherosclerosis ([place]) 08/28/2016  . Throat irritation 02/20/2015  . Hyperglycemia 09/01/2014  . Health care maintenance 08/07/2014  . Diverticulosis of colon without hemorrhage 02/28/2014  . Breast pain, right 11/12/2013  . Breast thickening 11/12/2013  . Irregular heart rhythm 02/22/2013  . OA (osteoarthritis) of knee 04/30/2012  . Skin cancer 02/10/2012  . Hypertension 02/04/2012  . Hypercholesterolemia 02/04/2012  . Hiatal hernia with GERD 02/04/2012  . History of HPV infection 02/04/2012  . Barrett's esophagus 02/04/2012   Everlean Alstrom. Graylon Good, PT, DPT 04/01/19, 8:11 PM  [facility] [address] [place], [address] Phone: [phone]   Fax:  [phone]  Name: Kaitlin Dean MRN: [id number] Date of Birth: [birth year]

## 2019-04-02 ENCOUNTER — Other Ambulatory Visit: Payer: Self-pay

## 2019-04-02 ENCOUNTER — Encounter: Payer: Self-pay | Admitting: Physical Therapy

## 2019-04-02 ENCOUNTER — Ambulatory Visit: Payer: Medicare PPO | Admitting: Physical Therapy

## 2019-04-02 DIAGNOSIS — M6281 Muscle weakness (generalized): Secondary | ICD-10-CM

## 2019-04-02 DIAGNOSIS — M25611 Stiffness of right shoulder, not elsewhere classified: Secondary | ICD-10-CM

## 2019-04-02 DIAGNOSIS — M25511 Pain in right shoulder: Secondary | ICD-10-CM

## 2019-04-02 NOTE — Therapy (Signed)
[facility] [address], [place], [address] Phone: [phone]   Fax:  [phone]  Physical Therapy Treatment  Patient Details  Name: Kaitlin Dean MRN: [id number] Date of Birth: [birth year] Referring Provider (PT): Alveda Reasons, MD   Encounter Date: 04/02/2019  PT End of Session - 04/02/19 1404    Visit Number  14    Number of Visits  27    Date for PT Re-Evaluation  05/12/19    Authorization Type  [employer] medicare reporting from 03/17/2019    Authorization Time Period  Craig Staggers [id number] 16 visits 03/23/19 - 05/12/19    Authorization - Visit Number  4    Authorization - Number of Visits  16    Progress Note Due on Visit  20    PT Start Time  1350    PT Stop Time  1428    PT Time Calculation (min)  38 min    Activity Tolerance  Patient tolerated treatment well    Behavior During Therapy  [facility] for tasks assessed/performed       Past Medical History:  Diagnosis Date  . 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 Barrett's esophagus   . Chicken pox   . Complication of anesthesia    woke up 15 years ago and felt like could not breathe - no complications   . Diverticulosis of colon without hemorrhage 02/28/2014  . GERD (gastroesophageal reflux disease)   . History of fractured kneecap   . History of fractured pelvis   . History of hiatal hernia   . Hypercholesterolemia   . Hypertension   . Osteoarthritis   . Osteoporosis   . Pneumonia    in past  . 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 Shoulder fracture, right 11/07/2018  . Skin cancer     Past Surgical History:  Procedure Laterality Date  . BREAST EXCISIONAL BIOPSY Right 2000   benign  . BREAST SURGERY  2000   Biopsy  . CATARACT EXTRACTION W/PHACO Right 12/05/2016   Procedure: CATARACT EXTRACTION PHACO AND INTRAOCULAR LENS PLACEMENT ([place]) RIGHT SYMFONY LENS;  Surgeon: Leandrew Koyanagi, MD;  Location: [place];  Service: Ophthalmology;  Laterality: Right;  . CATARACT EXTRACTION W/PHACO Left 02/06/2017   Procedure: CATARACT EXTRACTION PHACO AND INTRAOCULAR LENS PLACEMENT ([place]) LEFT SYMFONY LENS;  Surgeon: Leandrew Koyanagi, MD;  Location: [place];  Service: Ophthalmology;  Laterality: Left;  . COLONOSCOPY    . ESOPHAGOGASTRODUODENOSCOPY    . ESOPHAGOGASTRODUODENOSCOPY (EGD) WITH PROPOFOL N/A 06/11/2016   Procedure: ESOPHAGOGASTRODUODENOSCOPY (EGD) WITH PROPOFOL;  Surgeon: Manya Silvas, MD;  Location: [facility];  Service: Endoscopy;  Laterality: N/A;  . JOINT REPLACEMENT Left    knee  . PTOSIS REPAIR Bilateral 06/01/2014   Procedure: PTOSIS REPAIR;  Surgeon: Karle Starch, MD;  Location: [place];  Service: Ophthalmology;  Laterality: Bilateral;  . SKIN CANCER EXCISION    . TOTAL KNEE ARTHROPLASTY Left 04/30/2012   Procedure: TOTAL KNEE ARTHROPLASTY;  Surgeon: Gearlean Alf, MD;  Location: [facility];  Service: Orthopedics;  Laterality: Left;  . TUBAL LIGATION      There were no vitals filed for this visit.  Subjective Assessment - 04/02/19 1357    Subjective  Patient reports she is doing well today. Got something from her insurance company that she has questions about. Reports no pain at rest. States she has been walking daily and really watching  her carbs to improve her blood glucose. She gets her blood glucose re-checked in a month. reports no excessive soreness or pain following last treatment session.    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 osteoporosis    Limitations  Other (comment)   Functional limitations:  cannot fix hair or reach top of head in the back, reaching front, back, side, lifting above shoulder, cleaning house, swim backstroke, golfing.   Diagnostic tests  radiograph showed fracture of R proximal humerous, more recent radiograph show good healing per pt    Patient Stated Goals  "to get my arms back like they used to be" "would like to be able to swim backstroke"  Wants to be able to raise R arm completely up and also has pain with extension/internal rotation that she wants to go away.    Currently in Pain?  No/denies    Pain Onset  More than a month ago      TREATMENT:    Therapeutic exercise:to centralize symptoms and improve ROM, strength, muscular endurance, and activity tolerance required for successful completion of functional activities.  Hooklying AAROM focusing on R shoulder, using PVC stick, instructed for 5 second holds into temporary discomfort, pt holding about 1 second:  - flexionwith 6# on stick x20 - ERwith towel bolsterx20, improved carry over - chest press with plus 4# DB in each hand, 1 second hold, 3x10 (attempted 5# but too heavy) - Supine R ER/IR with towel bolster under upper arm, with 3# DB moving to end range with active advancement into restriction x 20 each direction.   Manual therapy:to reduce pain and tissue tension, improve range of motion, neuromodulation, in order to promote improved ability to complete functional activities. - supine PROM R shoulder flexion, abduction, IR, ER with overpressure as tolerated x20each directions to improve motion.  - R GH joint mobs grade III-IV as tolerated, AP, caudal, distraction in neutral and max abduction.  HOME EXERCISE PROGRAM Access Code: [email or web address] URL: [email or web address] Date: 03/26/2019  Prepared by: Rosita Kea   Exercises Supine Shoulder Flexion with Dowel - 1-2 sets - 20 reps - 5 seconds hold - 1x daily - 7x weekly Supine Shoulder External Rotation in 45  Degrees Abduction AAROM with Dowel - 1-2 sets - 20 reps - 5 seconds hold - 1x daily - 7x weekly Seated Shoulder Abduction AAROM with Pulley Behind - 1-2 sets - 20 reps - 5 seconds hold - 1x daily - 7x weekly Standing Shoulder Extension AAROM with Dowel - 1-2 sets - 20 reps - 5 seconds hold - 1x daily - 7x weekly Standing Shoulder Internal Rotation Stretch with Towel - 1-2 sets - 20 reps - 5 seconds hold - 7x daily - 7x weekly Standing Shoulder Internal Rotation AAROM with Dowel - 1-2 sets - 20 reps - 5 seconds hold - 1x daily - 7x weekly    PT Education - 04/02/19 1404    Education Details  Exercise purpose/form. Self management techniques.    Person(s) Educated  Patient    Methods  Explanation;Demonstration;Tactile cues;Verbal cues    Comprehension  Verbalized understanding;Returned demonstration;Verbal cues required;Tactile cues required;Need further instruction       PT Short Term Goals - 03/17/19  [place] #1   Title  Be independent with initial home exercise program for self-management of symptoms.    Baseline  initial HEP provided at IE (12/16/2018)    Time  2    Period  Weeks    Status  Revised    Target Date  03/31/19        PT Long Term Goals - 03/17/19 1940      PT LONG TERM GOAL #1   Title  Be independent with a long-term home exercise program for self-management of symptoms    Baseline  Initial HEP provided at IE (12/16/2018);    Time  8    Period  Weeks    Status  Revised    Target Date  05/12/19      PT LONG TERM GOAL #2   Title  emonstrate improved FOTO score to equal or greater than 73 to demonstrate improvement in overall condition and self-reported functional ability.    Baseline  FOTO = 55 (12/16/2018); FOTO = 71 (03/17/2019);    Time  8    Period  Weeks    Status  Revised    Target Date  05/12/19      PT LONG TERM GOAL #3   Title  Demonstrate R shoulder AROM equal to or greater than L shoulder AROM with 1/10 or less  pain in all planes to allow patient to complete valued activities with less difficulty.    Baseline  see objective measures (12/16/2018); continues to be limited but is better than at initial eval - see objective exam (03/17/2019);    Time  8    Period  Weeks    Status  Partially Met    Target Date  05/12/19      PT LONG TERM GOAL #4   Title  Demonstrate R shoulder MMT 4/5 or greater to improve ability to perform reaching, carrying, and lifting tasks.    Baseline  testing deferred due to precautions (12/16/2018); see testing in objective exam (03/17/2019);    Time  8    Period  Weeks    Status  Partially Met    Target Date  05/12/19      PT LONG TERM GOAL #5   Title  Complete community, work and/or recreational activities without limitation due to current condition.    Baseline  Functional limitations: cannot fix hair or reach top of head in the back, was restricted in eating with R UE, driving, pushing, seatbelt, reaching, lifting above shoulder, dressing and bathing, gardening, cleaning house, swim backstroke, cooking (12/16/2018); cannot fix hair or reach top of head in the back, reaching front, back, side, lifting above shoulder, cleaning house, swim backstroke, golfing, grooming (03/17/2019);    Time  8    Period  Weeks    Status  Partially Met    Target Date  05/12/19      Additional Long Term Goals   Additional Long Term Goals  Yes      PT LONG TERM GOAL #6   Title  Patient will report improvement in PSFS to at least 9/10 in each category (golf, swimming, combing hair in the back), to demonstrate improved function in areas that patient values.    Baseline  swimming = 6/10, golf 7/10, combing hair in back = 7/10 (03/17/2019);    Time  8    Period  Weeks    Status  New    Target Date  05/12/19            Plan - 04/02/19 1911    Clinical Impression Statement  Patient tolerated treatment well overall with diffiuclty with end range stiffness and discomfort in R shoulder. Able  to progress some exercise resistance and reps. Included exercises for end range strengthening and stretching. Patient would benefit from continued management of limiting condition by skilled physical therapist to address remaining impairments and functional limitations to work towards stated goals and return to PLOF or maximal functional independence.    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    Examination-Activity Limitations  Bathing;Lift;Reach Overhead;Carry;Dressing;Hygiene/Grooming;Self Feeding;Other   cannot fix hair or reach top of head in the back, was restricted in eating with R UE, driving, pushing, seatbelt, reaching, lifting above shoulder, dressing and bathing, gardening, cleaning house, swim backstroke, cooking.   Examination-Participation Restrictions  Community Activity;Driving;Laundry;Meal Prep;Yard Work    Stability/Clinical Decision Making  Stable/Uncomplicated    Rehab Potential  Good    PT Frequency  2x / week    PT Duration  8 weeks    PT Treatment/Interventions  ADLs/Self Care Home Management;Aquatic Therapy;Electrical Stimulation;Cryotherapy;Moist Heat;Functional mobility training;Therapeutic activities;Therapeutic exercise;Neuromuscular re-education;Patient/family education;Manual techniques;Passive range of motion;Dry needling;Spinal Manipulations;Joint Manipulations    PT Next Visit Plan  ROM exercise and strengthening for the R shoulder    PT Home Exercise Plan  Medbridge Access Code: [email or web address]    Consulted and Agree with Plan of Care  Patient        Patient will benefit from skilled therapeutic intervention in order to improve the following deficits and impairments:  Increased fascial restricitons, Pain, Improper body mechanics, Postural dysfunction, Increased muscle spasms, Decreased mobility, Decreased coordination, Decreased activity tolerance, Decreased endurance, Decreased range of motion, Decreased strength, Hypomobility, Impaired perceived functional ability, Impaired UE functional use, Impaired flexibility, Decreased knowledge of precautions  Visit Diagnosis: Acute pain of right shoulder  Stiffness of right shoulder, not elsewhere classified  Muscle weakness (generalized)     Problem List Patient Active Problem List   Diagnosis Date Noted  . Controlled diabetes mellitus type 2 with complications ([place]) 81/85/9093  . Humerus fracture 02/24/2019  . Aortic atherosclerosis ([place]) 08/28/2016  . Throat irritation 02/20/2015  . Hyperglycemia 09/01/2014  . Health care maintenance 08/07/2014  . Diverticulosis of colon without hemorrhage 02/28/2014  . Breast pain, right 11/12/2013  . Breast thickening 11/12/2013  . Irregular heart rhythm 02/22/2013  . OA (osteoarthritis) of knee 04/30/2012  . Skin cancer 02/10/2012  . Hypertension 02/04/2012  . Hypercholesterolemia 02/04/2012  . Hiatal hernia with GERD 02/04/2012  . History of HPV infection 02/04/2012  . Barrett's esophagus 02/04/2012    Everlean Alstrom. Graylon Good, PT, DPT 04/02/19, 7:12 PM  [facility] [address] [place], [address] Phone: [phone]   Fax:  [phone]  Name: Kaitlin Dean MRN: [id number] Date of Birth: Apr 17, 1945

## 2019-04-07 ENCOUNTER — Other Ambulatory Visit: Payer: Self-pay

## 2019-04-07 ENCOUNTER — Ambulatory Visit: Payer: Medicare PPO | Admitting: Physical Therapy

## 2019-04-07 ENCOUNTER — Encounter: Payer: Self-pay | Admitting: Physical Therapy

## 2019-04-07 DIAGNOSIS — M25511 Pain in right shoulder: Secondary | ICD-10-CM

## 2019-04-07 DIAGNOSIS — M6281 Muscle weakness (generalized): Secondary | ICD-10-CM

## 2019-04-07 DIAGNOSIS — M25611 Stiffness of right shoulder, not elsewhere classified: Secondary | ICD-10-CM

## 2019-04-07 NOTE — Therapy (Signed)
[facility] [address] [place], [address] Phone: [phone]   Fax:  [phone]  Physical Therapy Treatment  Patient Details  Name: Kaitlin Dean MRN: [id number] Date of Birth: March 28, 1945 Referring Provider (PT): Alveda Reasons, MD   Encounter Date: 04/07/2019  PT End of Session - 04/07/19 1400    Visit Number  15    Number of Visits  27    Date for PT Re-Evaluation  05/12/19    Authorization Type  [employer] medicare reporting from 03/17/2019    Authorization Time Period  Craig Staggers [id number] 16 visits 03/23/19 - 05/12/19    Authorization - Visit Number  5    Authorization - Number of Visits  16    Progress Note Due on Visit  20    PT Start Time  1350    PT Stop Time  1430    PT Time Calculation (min)  40 min    Activity Tolerance  Patient tolerated treatment well    Behavior During Therapy  [facility] for tasks assessed/performed       Past Medical History:  Diagnosis Date  . 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 Barrett's esophagus   . Chicken pox   . Complication of anesthesia    woke up 15 years ago and felt like could not breathe - no complications   . Diverticulosis of colon without hemorrhage 02/28/2014  . GERD (gastroesophageal reflux disease)   . History of fractured kneecap   . History of fractured pelvis   . History of hiatal hernia   . Hypercholesterolemia   . Hypertension   . Osteoarthritis   . Osteoporosis   . Pneumonia    in past  . 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 Shoulder fracture, right 11/07/2018  . Skin cancer     Past Surgical History:  Procedure Laterality Date  . BREAST EXCISIONAL BIOPSY Right 2000   benign  . BREAST SURGERY  2000   Biopsy  . CATARACT EXTRACTION W/PHACO Right 12/05/2016   Procedure: CATARACT EXTRACTION PHACO AND INTRAOCULAR LENS PLACEMENT ([place]) RIGHT SYMFONY LENS;  Surgeon: Leandrew Koyanagi, MD;  Location: [place];  Service: Ophthalmology;  Laterality: Right;  . CATARACT EXTRACTION W/PHACO Left 02/06/2017   Procedure: CATARACT EXTRACTION PHACO AND INTRAOCULAR LENS PLACEMENT ([place]) LEFT SYMFONY LENS;  Surgeon: Leandrew Koyanagi, MD;  Location: [place];  Service: Ophthalmology;  Laterality: Left;  . COLONOSCOPY    . ESOPHAGOGASTRODUODENOSCOPY    . ESOPHAGOGASTRODUODENOSCOPY (EGD) WITH PROPOFOL N/A 06/11/2016   Procedure: ESOPHAGOGASTRODUODENOSCOPY (EGD) WITH PROPOFOL;  Surgeon: Manya Silvas, MD;  Location: [facility];  Service: Endoscopy;  Laterality: N/A;  . JOINT REPLACEMENT Left    knee  . PTOSIS REPAIR Bilateral 06/01/2014   Procedure: PTOSIS REPAIR;  Surgeon: Karle Starch, MD;  Location: [place];  Service: Ophthalmology;  Laterality: Bilateral;  . SKIN CANCER EXCISION    . TOTAL KNEE ARTHROPLASTY Left 04/30/2012   Procedure: TOTAL KNEE ARTHROPLASTY;  Surgeon: Gearlean Alf, MD;  Location: [facility];  Service: Orthopedics;  Laterality: Left;  . TUBAL LIGATION      There were no vitals filed for this visit.  Subjective Assessment - 04/07/19 1357    Subjective  Patient reports no pain upon arrival. States she has been doing biceps curls at home with 5# DBs and notices her R arm is weaker than her L arm with biceps curls.  She is hoping to go on a trip in April 2-9 if she can golf. She is doing some yardwork and wants to go out to a driving range to check out her golf skills. She states she can do most things she wants to, but still notes some weakness in the R compared to the left UE. Felt fine following last treatment session.    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 osteoporosis    Limitations  Other (comment)   Functional limitations: cannot fix hair or reach top of head in the back, reaching front, back, side, lifting above shoulder, cleaning house, swim backstroke, golfing.   Diagnostic tests  radiograph showed fracture of R proximal humerous, more recent radiograph show good healing per pt    Patient Stated Goals  "to get my arms back like they used to be" "would like to be able to swim backstroke"  Wants to be able to raise R arm completely up and also has pain with extension/internal rotation that she wants to go away.    Currently in Pain?  No/denies    Pain Onset  More than a month ago       TREATMENT:    Therapeutic exercise:to centralize symptoms and improve ROM, strength, muscular endurance, and activity tolerance required for successful completion of functional activities.  Hooklying AAROM focusing on R shoulder, using PVC stick, instructed for 5 second holds into temporary discomfort, pt holding about 1 second:  - flexionwith 6# on stick x40 - ERwith towel bolsterx40, improved carry over - supine bicep curl to chest press withplus4# DB in each hand,1 second hold, 2x15  - Supine R ER/IR with towel bolster under upper arm, with 4# DB moving to end range with active advancement into restriction x 20 each direction.  Manual therapy:to reduce pain and tissue tension, improve range of motion, neuromodulation, in order to promote improved ability to complete functional activities. - supine PROM R shoulder flexion, abduction, with overpressure as tolerated x20each directions to improve motion.  - R GH joint mobs grade III-IV as tolerated, AP, caudal, distraction in neutral and max abduction.  HOME EXERCISE PROGRAM Access Code: [email or web address] URL: [email or web address] Date: 03/26/2019  Prepared by: Rosita Kea   Exercises Supine Shoulder Flexion  with Dowel - 1-2 sets - 20 reps - 5 seconds hold - 1x daily - 7x weekly Supine Shoulder External Rotation in 45 Degrees Abduction AAROM with Dowel - 1-2 sets - 20 reps - 5 seconds hold - 1x daily - 7x weekly Seated Shoulder Abduction AAROM with Pulley Behind - 1-2 sets - 20 reps - 5 seconds hold - 1x daily - 7x weekly Standing Shoulder Extension AAROM with Dowel - 1-2 sets - 20 reps - 5 seconds hold - 1x daily - 7x weekly Standing Shoulder Internal Rotation Stretch with Towel - 1-2 sets - 20 reps - 5 seconds hold - 7x daily - 7x weekly Standing Shoulder Internal Rotation AAROM with Dowel - 1-2 sets - 20 reps - 5 seconds hold - 1x daily - 7x weekly    PT Education - 04/07/19 1359    Education Details  Exercise purpose/form. Self management techniques.    Person(s) Educated  Patient  Methods  Explanation;Demonstration;Tactile cues;Verbal cues    Comprehension  Verbalized understanding;Returned demonstration;Verbal cues required;Tactile cues required;Need further instruction       PT Short Term Goals - 03/17/19 1940      PT SHORT TERM GOAL #1   Title  Be independent with initial home exercise program for self-management of symptoms.    Baseline  initial HEP provided at IE (12/16/2018)    Time  2    Period  Weeks    Status  Revised    Target Date  03/31/19        PT Long Term Goals - 03/17/19 1940      PT LONG TERM GOAL #1   Title  Be independent with a long-term home exercise program for self-management of symptoms    Baseline  Initial HEP provided at IE (12/16/2018);    Time  8    Period  Weeks    Status  Revised    Target Date  05/12/19      PT LONG TERM GOAL #2   Title  emonstrate improved FOTO score to equal or greater than 73 to demonstrate improvement in overall condition and self-reported functional ability.    Baseline  FOTO = 55 (12/16/2018); FOTO = 71 (03/17/2019);    Time  8    Period  Weeks    Status  Revised    Target Date  05/12/19      PT LONG  TERM GOAL #3   Title  Demonstrate R shoulder AROM equal to or greater than L shoulder AROM with 1/10 or less pain in all planes to allow patient to complete valued activities with less difficulty.    Baseline  see objective measures (12/16/2018); continues to be limited but is better than at initial eval - see objective exam (03/17/2019);    Time  8    Period  Weeks    Status  Partially Met    Target Date  05/12/19      PT LONG TERM GOAL #4   Title  Demonstrate R shoulder MMT 4/5 or greater to improve ability to perform reaching, carrying, and lifting tasks.    Baseline  testing deferred due to precautions (12/16/2018); see testing in objective exam (03/17/2019);    Time  8    Period  Weeks    Status  Partially Met    Target Date  05/12/19      PT LONG TERM GOAL #5   Title  Complete community, work and/or recreational activities without limitation due to current condition.    Baseline  Functional limitations: cannot fix hair or reach top of head in the back, was restricted in eating with R UE, driving, pushing, seatbelt, reaching, lifting above shoulder, dressing and bathing, gardening, cleaning house, swim backstroke, cooking (12/16/2018); cannot fix hair or reach top of head in the back, reaching front, back, side, lifting above shoulder, cleaning house, swim backstroke, golfing, grooming (03/17/2019);    Time  8    Period  Weeks    Status  Partially Met    Target Date  05/12/19      Additional Long Term Goals   Additional Long Term Goals  Yes      PT LONG TERM GOAL #6   Title  Patient will report improvement in PSFS to at least 9/10 in each category (golf, swimming, combing hair in the back), to demonstrate improved function in areas that patient values.    Baseline  swimming = 6/10, golf 7/10,  combing hair in back = 7/10 (03/17/2019);    Time  8    Period  Weeks    Status  New    Target Date  05/12/19            Plan - 04/07/19 1409    Clinical Impression Statement  Patient  tolerated treatment well and continues to show improved motor control, form carry over, PROM, and strength. Continues to experience end range pain and joint stiffness. R UE tires more quickly than left and demonstrate compensatory muscle activation patterns in the shoulder girdle. During supine chest press exercise, patient tried to move her mask back into place and accidentally hit her left cheek/just below the eye with the 4# DB. Requested and was provided with ice to help decrease bruising. Patient denied it hitting teeth.  Patient would benefit from continued management of limiting condition by skilled physical therapist to address remaining impairments and functional limitations to work towards stated goals and return to PLOF or maximal functional independence.    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    Examination-Activity Limitations  Bathing;Lift;Reach Overhead;Carry;Dressing;Hygiene/Grooming;Self Feeding;Other   cannot fix hair or reach top of head in the back, was restricted in eating with R UE, driving, pushing, seatbelt, reaching, lifting above shoulder, dressing and bathing, gardening, cleaning house, swim backstroke, cooking.   Examination-Participation Restrictions  Community Activity;Driving;Laundry;Meal Prep;Yard Work    Stability/Clinical Decision Making  Stable/Uncomplicated    Rehab Potential  Good    PT Frequency  2x / week    PT Duration  8 weeks    PT Treatment/Interventions  ADLs/Self Care Home Management;Aquatic Therapy;Electrical  Stimulation;Cryotherapy;Moist Heat;Functional mobility training;Therapeutic activities;Therapeutic exercise;Neuromuscular re-education;Patient/family education;Manual techniques;Passive range of motion;Dry needling;Spinal Manipulations;Joint Manipulations    PT Next Visit Plan  ROM exercise and strengthening for the R shoulder    PT Home Exercise Plan  Medbridge Access Code: [email or web address]    Consulted and Agree with Plan of Care  Patient       Patient will benefit from skilled therapeutic intervention in order to improve the following deficits and impairments:  Increased fascial restricitons, Pain, Improper body mechanics, Postural dysfunction, Increased muscle spasms, Decreased mobility, Decreased coordination, Decreased activity tolerance, Decreased endurance, Decreased range of motion, Decreased strength, Hypomobility, Impaired perceived functional ability, Impaired UE functional use, Impaired flexibility, Decreased knowledge of precautions  Visit Diagnosis: Acute pain of right shoulder  Stiffness of right shoulder, not elsewhere classified  Muscle weakness (generalized)     Problem List Patient Active Problem List   Diagnosis Date Noted  . Controlled diabetes mellitus type 2 with complications ([place]) 26/37/8588  . Humerus fracture 02/24/2019  . Aortic atherosclerosis ([place]) 08/28/2016  . Throat irritation 02/20/2015  . Hyperglycemia 09/01/2014  . Health care maintenance 08/07/2014  . Diverticulosis of colon without hemorrhage 02/28/2014  . Breast pain, right 11/12/2013  . Breast thickening 11/12/2013  . Irregular heart rhythm 02/22/2013  . OA (osteoarthritis) of knee 04/30/2012  . Skin cancer 02/10/2012  . Hypertension 02/04/2012  . Hypercholesterolemia 02/04/2012  . Hiatal hernia with GERD 02/04/2012  . History of HPV infection 02/04/2012  . Barrett's esophagus 02/04/2012  Everlean Alstrom. Graylon Good, PT, DPT 04/07/19, 2:18 PM  [facility] [address], [place], [address] Phone: [phone]   Fax:  [phone]  Name: RAYLINN KOSAR MRN: [id number] Date of Birth: 11/03/1945

## 2019-04-08 ENCOUNTER — Ambulatory Visit (INDEPENDENT_AMBULATORY_CARE_PROVIDER_SITE_OTHER): Payer: Medicare PPO | Admitting: Gastroenterology

## 2019-04-08 ENCOUNTER — Other Ambulatory Visit: Payer: Self-pay

## 2019-04-08 ENCOUNTER — Encounter: Payer: Self-pay | Admitting: Gastroenterology

## 2019-04-08 VITALS — BP 102/65 | HR 83 | Temp 97.7°F | Ht 63.0 in | Wt 187.6 lb

## 2019-04-08 DIAGNOSIS — R1310 Dysphagia, unspecified: Secondary | ICD-10-CM

## 2019-04-08 DIAGNOSIS — K2270 Barrett's esophagus without dysplasia: Secondary | ICD-10-CM

## 2019-04-08 DIAGNOSIS — R87619 Unspecified abnormal cytological findings in specimens from cervix uteri: Secondary | ICD-10-CM | POA: Insufficient documentation

## 2019-04-08 DIAGNOSIS — M810 Age-related osteoporosis without current pathological fracture: Secondary | ICD-10-CM | POA: Insufficient documentation

## 2019-04-08 DIAGNOSIS — R1319 Other dysphagia: Secondary | ICD-10-CM

## 2019-04-08 NOTE — Progress Notes (Signed)
Gastroenterology Consultation  Referring Provider:     Einar Pheasant, MD Primary Care Physician:  Einar Pheasant, MD Primary Gastroenterologist:  Dr. Allen Norris     Reason for Consultation:     Barrett's esophagus        HPI:   Kaitlin Dean is a 74 y.o. y/o female referred for consultation & management of Barrett's esophagus by Dr. Einar Pheasant, MD.  This patient comes to see me after being followed by Dr. Vira Agar from 2009-2019.  The patient has been diagnosed with Barrett's esophagus and her last EGD was in May 2018.  The biopsies at that time were consistent with reflux and Barrett's esophagus. The patient now reports that she has been having some dysphagia and she has had that in the past with a history of dilation as described by her.  The patient denies any unexplained weight loss fevers chills nausea or vomiting.  She denies any bloody stools.  The patient has been taking omeprazole over-the-counter.  She reports that she was given pantoprazole to switch over but it did not work and she says that the generic omeprazole also does not work. The patient reports that she is due for repeat upper endoscopy for Barrett's esophagus.  The patient also states that she has a tickle in her throat after she is talking on the phone for an extended amount of time and is wondering if that is related to her reflux.  Past Medical History:  Diagnosis Date  . 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 Barrett's esophagus   . Chicken pox   . Complication of anesthesia    woke up 15 years ago and felt like could not breathe - no complications   . Diverticulosis of colon without hemorrhage 02/28/2014  . GERD (gastroesophageal reflux disease)   . History of fractured kneecap   . History of fractured pelvis   . History of hiatal hernia   . Hypercholesterolemia   . Hypertension   . Osteoarthritis   . Osteoporosis   . Pneumonia    in past  . 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 Shoulder  fracture, right 11/07/2018  . Skin cancer     Past Surgical History:  Procedure Laterality Date  . BREAST EXCISIONAL BIOPSY Right 2000   benign  . BREAST SURGERY  2000   Biopsy  . CATARACT EXTRACTION W/PHACO Right 12/05/2016   Procedure: CATARACT EXTRACTION PHACO AND INTRAOCULAR LENS PLACEMENT ([place]) RIGHT SYMFONY LENS;  Surgeon: Leandrew Koyanagi, MD;  Location: [place];  Service: Ophthalmology;  Laterality: Right;  . CATARACT EXTRACTION W/PHACO Left 02/06/2017   Procedure: CATARACT EXTRACTION PHACO AND INTRAOCULAR LENS PLACEMENT ([place]) LEFT SYMFONY LENS;  Surgeon: Leandrew Koyanagi, MD;  Location: [place];  Service: Ophthalmology;  Laterality: Left;  . COLONOSCOPY    . ESOPHAGOGASTRODUODENOSCOPY    . ESOPHAGOGASTRODUODENOSCOPY (EGD) WITH PROPOFOL N/A 06/11/2016   Procedure: ESOPHAGOGASTRODUODENOSCOPY (EGD) WITH PROPOFOL;  Surgeon: Manya Silvas, MD;  [facility];  Service: Endoscopy;  Laterality: N/A;  . JOINT REPLACEMENT Left    knee  . PTOSIS REPAIR Bilateral 06/01/2014   Procedure: PTOSIS REPAIR;  Surgeon: Karle Starch, MD;  Location: [place];  Service: Ophthalmology;  Laterality: Bilateral;  . SKIN CANCER EXCISION    . TOTAL KNEE ARTHROPLASTY Left 04/30/2012   Procedure: TOTAL KNEE ARTHROPLASTY;  Surgeon: Gearlean Alf, MD;  Location: [facility];  Service: Orthopedics;  Laterality: Left;  . TUBAL LIGATION  Prior to Admission medications   Medication Sig Start Date End Date Taking? Authorizing Provider  albuterol (PROVENTIL HFA;VENTOLIN HFA) 108 (90 Base) MCG/ACT inhaler Inhale 2 puffs into the lungs every 6 (six) hours as needed for wheezing or shortness of breath. 02/24/18   Guse, Jacquelynn Cree, FNP  Cholecalciferol (VITAMIN D3 PO) Take by mouth daily.    [provider]  ezetimibe-simvastatin (VYTORIN) 10-20 MG tablet TAKE ONE TABLET AT BEDTIME 03/09/19   Einar Pheasant, MD  MELATONIN PO Take 1 capsule by mouth.     [provider]  Multiple Vitamin (MULTIVITAMIN) capsule Take 1 capsule by mouth daily. AM    [provider]  pantoprazole (PROTONIX) 40 MG tablet Take 1 tablet (40 mg total) by mouth daily. 02/24/19   Einar Pheasant, MD  telmisartan (MICARDIS) 40 MG tablet TAKE ONE TABLET EVERY MORNING 03/09/19   Einar Pheasant, MD  vitamin B-12 (CYANOCOBALAMIN) 1000 MCG tablet Take 1,000 mcg by mouth daily.    [provider]  Vitamin E 400 units TABS Take by mouth daily.    [provider]    Family History  Problem Relation Age of Onset  . Hypertension Mother   . Congestive Heart Failure Mother   . Stroke Father   . Stroke Brother   . Heart failure Brother   . Sarcoidosis Sister        died of kidney failure  . Breast cancer Neg Hx   . Colon cancer Neg Hx      Social History   Tobacco Use  . Smoking status: Never Smoker  . Smokeless tobacco: Never Used  Substance Use Topics  . Alcohol use: Yes    Alcohol/week: 1.0 standard drinks    Types: 1 Glasses of wine per week    Comment: SOCIALLY  . Drug use: No    Allergies as of 04/08/2019 - Review Complete 04/07/2019  Allergen Reaction Noted  . Flagyl [metronidazole] Nausea And Vomiting 10/01/2013    Review of Systems:    All systems reviewed and negative except where noted in HPI.   Physical Exam:  LMP 01/29/1993  Patient's last menstrual period was 01/29/1993. General:   Alert,  Well-developed, well-nourished, pleasant and cooperative in NAD Head:  Normocephalic and atraumatic. Eyes:  Sclera clear, no icterus.   Conjunctiva pink. Ears:  Normal auditory acuity. Neck:  Supple; no masses or thyromegaly. Lungs:  Respirations even and unlabored.  Clear throughout to auscultation.   No wheezes, crackles, or rhonchi. No acute distress. Heart:  Regular rate and rhythm; no murmurs, clicks, rubs, or gallops. Abdomen:  Normal bowel sounds.  No bruits.  Soft, non-tender and non-distended without masses,  hepatosplenomegaly or hernias noted.  No guarding or rebound tenderness.  Negative Carnett sign.   Rectal:  Deferred.  Pulses:  Normal pulses noted. Extremities:  No clubbing or edema.  No cyanosis. Neurologic:  Alert and oriented x3;  grossly normal neurologically. Skin:  Intact without significant lesions or rashes.  No jaundice. Lymph Nodes:  No significant cervical adenopathy. Psych:  Alert and cooperative. Normal mood and affect.  Imaging Studies: No results found.  Assessment and Plan:   RALISHA INVERSO is a 74 y.o. y/o female who comes in today with a history of Barrett's esophagus and due for a repeat upper endoscopy.  The patient reports that she has a tickle in her throat when she speaks too much and she is wondering if this may be related to her acid reflux.  The patient  has been told that we will look at her esophagus during the upper endoscopy if there is any sign of reflux then we may increase her omeprazole to twice a day.  She will also be evaluated due to her history of Barrett's esophagus. I have discussed risks & benefits which include, but are not limited to, bleeding, infection, perforation & drug reaction.  The patient agrees with this plan & written consent will be obtained.       Lucilla Lame, MD. Marval Regal    Note: This dictation was prepared with Dragon dictation along with smaller phrase technology. Any transcriptional errors that result from this process are unintentional.

## 2019-04-10 ENCOUNTER — Other Ambulatory Visit: Payer: Self-pay

## 2019-04-10 DIAGNOSIS — R1319 Other dysphagia: Secondary | ICD-10-CM

## 2019-04-10 DIAGNOSIS — K2270 Barrett's esophagus without dysplasia: Secondary | ICD-10-CM

## 2019-04-10 DIAGNOSIS — R1310 Dysphagia, unspecified: Secondary | ICD-10-CM

## 2019-04-14 ENCOUNTER — Encounter: Payer: Self-pay | Admitting: Physical Therapy

## 2019-04-14 ENCOUNTER — Other Ambulatory Visit: Payer: Self-pay

## 2019-04-14 ENCOUNTER — Ambulatory Visit: Payer: Medicare PPO | Admitting: Physical Therapy

## 2019-04-14 DIAGNOSIS — M25611 Stiffness of right shoulder, not elsewhere classified: Secondary | ICD-10-CM | POA: Diagnosis not present

## 2019-04-14 DIAGNOSIS — M25511 Pain in right shoulder: Secondary | ICD-10-CM

## 2019-04-14 DIAGNOSIS — M6281 Muscle weakness (generalized): Secondary | ICD-10-CM

## 2019-04-14 NOTE — Therapy (Signed)
[facility] [address], [place], [address] Phone: [phone]   Fax:  [phone]  Physical Therapy Treatment  Patient Details  Name: Kaitlin Dean MRN: [id number] Date of Birth: December 13, 1945 Referring Provider (PT): Alveda Reasons, MD   Encounter Date: 04/14/2019  PT End of Session - 04/14/19 1038    Visit Number  16    Number of Visits  27    Date for PT Re-Evaluation  05/12/19    Authorization Type  [employer] medicare reporting from 03/17/2019    Authorization Time Period  Craig Staggers [id number] 16 visits 03/23/19 - 05/12/19    Authorization - Visit Number  6    Authorization - Number of Visits  16    Progress Note Due on Visit  20    PT Start Time  1033    PT Stop Time  1112    PT Time Calculation (min)  39 min    Activity Tolerance  Patient tolerated treatment well    Behavior During Therapy  [facility] for tasks assessed/performed       Past Medical History:  Diagnosis Date  . 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 Barrett's esophagus   . Chicken pox   . Complication of anesthesia    woke up 15 years ago and felt like could not breathe - no complications   . Diverticulosis of colon without hemorrhage 02/28/2014  . GERD (gastroesophageal reflux disease)   . History of fractured kneecap   . History of fractured pelvis   . History of hiatal hernia   . Hypercholesterolemia   . Hypertension   . Osteoarthritis   . Osteoporosis   . Pneumonia    in past  . 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 Shoulder fracture, right 11/07/2018  . Skin cancer     Past Surgical History:  Procedure Laterality Date  . BREAST EXCISIONAL BIOPSY Right 2000   benign  . BREAST SURGERY  2000   Biopsy  . CATARACT EXTRACTION W/PHACO Right 12/05/2016   Procedure: CATARACT EXTRACTION PHACO AND INTRAOCULAR LENS PLACEMENT ([place]) RIGHT SYMFONY LENS;  Surgeon: Leandrew Koyanagi, MD;  Location: [place];  Service: Ophthalmology;  Laterality: Right;  . CATARACT EXTRACTION W/PHACO Left 02/06/2017   Procedure: CATARACT EXTRACTION PHACO AND INTRAOCULAR LENS PLACEMENT ([place]) LEFT SYMFONY LENS;  Surgeon: Leandrew Koyanagi, MD;  Location: [place];  Service: Ophthalmology;  Laterality: Left;  . COLONOSCOPY    . ESOPHAGOGASTRODUODENOSCOPY    . ESOPHAGOGASTRODUODENOSCOPY (EGD) WITH PROPOFOL N/A 06/11/2016   Procedure: ESOPHAGOGASTRODUODENOSCOPY (EGD) WITH PROPOFOL;  Surgeon: Manya Silvas, MD;  Location[facility];  Service: Endoscopy;  Laterality: N/A;  . JOINT REPLACEMENT Left    knee  . PTOSIS REPAIR Bilateral 06/01/2014   Procedure: PTOSIS REPAIR;  Surgeon: Karle Starch, MD;  Location: [place];  Service: Ophthalmology;  Laterality: Bilateral;  . SKIN CANCER EXCISION    . TOTAL KNEE ARTHROPLASTY Left 04/30/2012   Procedure: TOTAL KNEE ARTHROPLASTY;  Surgeon: Gearlean Alf, MD;  Location: [facility];  Service: Orthopedics;  Laterality: Left;  . TUBAL LIGATION      There were no vitals filed for this visit.  Subjective Assessment - 04/14/19 1041    Subjective  Patient reports she is feeling well today without any pain upon arrival. She states she has been working really hard in her yard successfully and was able to go to the driving  range successfully. Reports no excessive pain following last treatment session.    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 osteoporosis    Limitations  Other (comment)   Functional limitations: cannot fix hair or reach top of head in the back, reaching front, back, side, lifting above  shoulder, cleaning house, swim backstroke, golfing.   Diagnostic tests  radiograph showed fracture of R proximal humerous, more recent radiograph show good healing per pt    Patient Stated Goals  "to get my arms back like they used to be" "would like to be able to swim backstroke"  Wants to be able to raise R arm completely up and also has pain with extension/internal rotation that she wants to go away.    Currently in Pain?  No/denies    Pain Onset  More than a month ago       TREATMENT:  Therapeutic exercise:to centralize symptoms and improve ROM, strength, muscular endurance, and activity tolerance required for successful completion of functional activities.  Hooklying AAROM focusing on R shoulder, using PVC stick, instructed for 5 second holds into temporary discomfort, pt holding about 1 second:  - flexionwith6# on stickx 20 - ERwith towel bolsterx20, improved carry over  - supine bicep curl to chest press withplus5# DB in each hand,1 second hold,x20 at 5#,  x 20 at 6#.  - Supine R ER/IR with towel bolster under upper arm, with 5# DB moving to end range with active advancement into restriction 2x 15 each direction.  Manual therapy:to reduce pain and tissue tension, improve range of motion, neuromodulation, in order to promote improved ability to complete functional activities. - supine PROM R shoulder flexion, abduction, ER with overpressure as tolerated x20each directions to improve motion.  - R GH joint mobs grade III-IV as tolerated, AP, caudal, distraction in neutral and max abduction.  HOME EXERCISE PROGRAM Access Code: [email or web address] URL: [email or web address] Date: 03/26/2019  Prepared by: Rosita Kea   Exercises Supine Shoulder Flexion with Dowel - 1-2 sets - 20 reps - 5 seconds hold - 1x daily - 7x weekly Supine Shoulder External Rotation in 45 Degrees Abduction AAROM with Dowel - 1-2 sets - 20 reps - 5 seconds hold - 1x daily -  7x weekly Seated Shoulder Abduction AAROM with Pulley Behind - 1-2 sets - 20 reps - 5 seconds hold - 1x daily - 7x weekly Standing Shoulder Extension AAROM with Dowel - 1-2 sets - 20 reps - 5 seconds hold - 1x daily - 7x weekly Standing Shoulder Internal Rotation Stretch with Towel - 1-2 sets - 20 reps - 5 seconds hold - 7x daily - 7x weekly Standing Shoulder Internal Rotation AAROM with Dowel - 1-2 sets - 20 reps - 5 seconds hold - 1x daily - 7x weekly    PT Education - 04/14/19 1047    Education Details  Exercise purpose/form. Self management techniques.    Person(s) Educated  Patient    Methods  Explanation;Demonstration;Tactile cues;Verbal cues    Comprehension  Verbalized understanding;Returned demonstration;Verbal cues required;Tactile cues required;Need further instruction       PT Short Term Goals - 03/17/19 1940      PT SHORT TERM GOAL #1   Title  Be  independent with initial home exercise program for self-management of symptoms.    Baseline  initial HEP provided at IE (12/16/2018)    Time  2    Period  Weeks    Status  Revised    Target Date  03/31/19        PT Long Term Goals - 03/17/19 1940      PT LONG TERM GOAL #1   Title  Be independent with a long-term home exercise program for self-management of symptoms    Baseline  Initial HEP provided at IE (12/16/2018);    Time  8    Period  Weeks    Status  Revised    Target Date  05/12/19      PT LONG TERM GOAL #2   Title  emonstrate improved FOTO score to equal or greater than 73 to demonstrate improvement in overall condition and self-reported functional ability.    Baseline  FOTO = 55 (12/16/2018); FOTO = 71 (03/17/2019);    Time  8    Period  Weeks    Status  Revised    Target Date  05/12/19      PT LONG TERM GOAL #3   Title  Demonstrate R shoulder AROM equal to or greater than L shoulder AROM with 1/10 or less pain in all planes to allow patient to complete valued activities with less difficulty.    Baseline   see objective measures (12/16/2018); continues to be limited but is better than at initial eval - see objective exam (03/17/2019);    Time  8    Period  Weeks    Status  Partially Met    Target Date  05/12/19      PT LONG TERM GOAL #4   Title  Demonstrate R shoulder MMT 4/5 or greater to improve ability to perform reaching, carrying, and lifting tasks.    Baseline  testing deferred due to precautions (12/16/2018); see testing in objective exam (03/17/2019);    Time  8    Period  Weeks    Status  Partially Met    Target Date  05/12/19      PT LONG TERM GOAL #5   Title  Complete community, work and/or recreational activities without limitation due to current condition.    Baseline  Functional limitations: cannot fix hair or reach top of head in the back, was restricted in eating with R UE, driving, pushing, seatbelt, reaching, lifting above shoulder, dressing and bathing, gardening, cleaning house, swim backstroke, cooking (12/16/2018); cannot fix hair or reach top of head in the back, reaching front, back, side, lifting above shoulder, cleaning house, swim backstroke, golfing, grooming (03/17/2019);    Time  8    Period  Weeks    Status  Partially Met    Target Date  05/12/19      Additional Long Term Goals   Additional Long Term Goals  Yes      PT LONG TERM GOAL #6   Title  Patient will report improvement in PSFS to at least 9/10 in each category (golf, swimming, combing hair in the back), to demonstrate improved function in areas that patient values.    Baseline  swimming = 6/10, golf 7/10, combing hair in back = 7/10 (03/17/2019);    Time  8    Period  Weeks    Status  New    Target Date  05/12/19            Plan - 04/14/19 1052  Clinical Impression Statement  Patient tolerated treatment well overall and was able to progress resistance during strengthening exercises today. She continues to have end range pain and compensatory muscle activation during overhead activities.  Appears to be making good progress but has not yet returned to PLOF. Patient would benefit from continued management of limiting condition by skilled physical therapist to address remaining impairments and functional limitations to work towards stated goals and return to PLOF or maximal functional independence.    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    Examination-Activity Limitations  Bathing;Lift;Reach Overhead;Carry;Dressing;Hygiene/Grooming;Self Feeding;Other   cannot fix hair or reach top of head in the back, was restricted in eating with R UE, driving, pushing, seatbelt, reaching, lifting above shoulder, dressing and bathing, gardening, cleaning house, swim backstroke, cooking.   Examination-Participation Restrictions  Community Activity;Driving;Laundry;Meal Prep;Yard Work    Stability/Clinical Decision Making  Stable/Uncomplicated    Rehab Potential  Good    PT Frequency  2x / week    PT Duration  8 weeks    PT Treatment/Interventions  ADLs/Self Care Home Management;Aquatic Therapy;Electrical Stimulation;Cryotherapy;Moist Heat;Functional mobility training;Therapeutic activities;Therapeutic exercise;Neuromuscular re-education;Patient/family education;Manual techniques;Passive range of motion;Dry needling;Spinal Manipulations;Joint Manipulations    PT Next Visit Plan  ROM exercise and strengthening for the R shoulder    PT Home Exercise Plan  Medbridge Access Code: [email or web address]    Consulted and Agree with Plan of Care  Patient       Patient will benefit from skilled  therapeutic intervention in order to improve the following deficits and impairments:  Increased fascial restricitons, Pain, Improper body mechanics, Postural dysfunction, Increased muscle spasms, Decreased mobility, Decreased coordination, Decreased activity tolerance, Decreased endurance, Decreased range of motion, Decreased strength, Hypomobility, Impaired perceived functional ability, Impaired UE functional use, Impaired flexibility, Decreased knowledge of precautions  Visit Diagnosis: Acute pain of right shoulder  Stiffness of right shoulder, not elsewhere classified  Muscle weakness (generalized)     Problem List Patient Active Problem List   Diagnosis Date Noted  . Abnormal cervical Papanicolaou smear 04/08/2019  . Osteoporosis, post-menopausal 04/08/2019  . Controlled diabetes mellitus type 2 with complications ([place]) 41/32/4401  . Humerus fracture 02/24/2019  . Large hiatal hernia 11/07/2017  . Aortic atherosclerosis ([place]) 08/28/2016  . Throat irritation 02/20/2015  . Hyperglycemia 09/01/2014  . Health care maintenance 08/07/2014  . Diverticulosis of colon without hemorrhage 02/28/2014  . Breast pain, right 11/12/2013  . Breast thickening 11/12/2013  . Heme positive stool 10/27/2013  . History of diverticulitis of colon 10/27/2013  . Irregular heart rhythm 02/22/2013  . Cardiac dysrhythmia 02/22/2013  . OA (osteoarthritis) of knee 04/30/2012  . Fractured pelvis ([place]) 04/29/2012  . Knee fracture 04/29/2012  . Skin cancer 02/10/2012  . Hypertension 02/04/2012  . Hypercholesterolemia 02/04/2012  . Hiatal hernia with GERD 02/04/2012  . History of HPV infection 02/04/2012  . Barrett's esophagus 02/04/2012  . Primary localized osteoarthrosis, lower leg 11/22/2011   Everlean Alstrom. Graylon Good, PT, DPT 04/14/19, 10:54 AM  [facility] [address], [place], [address] Phone: [phone]   Fax:   [phone]  Name: Terrilyn Tyner  [place] MRN: [id number] Date of Birth: 1945-12-19

## 2019-04-16 ENCOUNTER — Ambulatory Visit: Payer: Medicare PPO | Admitting: Physical Therapy

## 2019-04-17 DIAGNOSIS — D2262 Melanocytic nevi of left upper limb, including shoulder: Secondary | ICD-10-CM | POA: Diagnosis not present

## 2019-04-17 DIAGNOSIS — Z85828 Personal history of other malignant neoplasm of skin: Secondary | ICD-10-CM | POA: Diagnosis not present

## 2019-04-17 DIAGNOSIS — L570 Actinic keratosis: Secondary | ICD-10-CM | POA: Diagnosis not present

## 2019-04-17 DIAGNOSIS — D2271 Melanocytic nevi of right lower limb, including hip: Secondary | ICD-10-CM | POA: Diagnosis not present

## 2019-04-17 DIAGNOSIS — D225 Melanocytic nevi of trunk: Secondary | ICD-10-CM | POA: Diagnosis not present

## 2019-04-17 DIAGNOSIS — L821 Other seborrheic keratosis: Secondary | ICD-10-CM | POA: Diagnosis not present

## 2019-04-17 DIAGNOSIS — C44712 Basal cell carcinoma of skin of right lower limb, including hip: Secondary | ICD-10-CM | POA: Diagnosis not present

## 2019-04-17 DIAGNOSIS — D485 Neoplasm of uncertain behavior of skin: Secondary | ICD-10-CM | POA: Diagnosis not present

## 2019-04-20 ENCOUNTER — Other Ambulatory Visit (INDEPENDENT_AMBULATORY_CARE_PROVIDER_SITE_OTHER): Payer: Medicare PPO

## 2019-04-20 ENCOUNTER — Ambulatory Visit: Payer: Medicare PPO | Admitting: Physical Therapy

## 2019-04-20 ENCOUNTER — Other Ambulatory Visit: Payer: Self-pay

## 2019-04-20 DIAGNOSIS — S42201D Unspecified fracture of upper end of right humerus, subsequent encounter for fracture with routine healing: Secondary | ICD-10-CM | POA: Diagnosis not present

## 2019-04-20 DIAGNOSIS — M6281 Muscle weakness (generalized): Secondary | ICD-10-CM | POA: Diagnosis not present

## 2019-04-20 DIAGNOSIS — M25611 Stiffness of right shoulder, not elsewhere classified: Secondary | ICD-10-CM | POA: Diagnosis not present

## 2019-04-20 DIAGNOSIS — D72819 Decreased white blood cell count, unspecified: Secondary | ICD-10-CM | POA: Diagnosis not present

## 2019-04-20 DIAGNOSIS — M25511 Pain in right shoulder: Secondary | ICD-10-CM

## 2019-04-20 DIAGNOSIS — S42215D Unspecified nondisplaced fracture of surgical neck of left humerus, subsequent encounter for fracture with routine healing: Secondary | ICD-10-CM | POA: Diagnosis not present

## 2019-04-20 LAB — CBC WITH DIFFERENTIAL/PLATELET
Basophils Absolute: 0 10*3/uL (ref 0.0–0.1)
Basophils Relative: 0.4 % (ref 0.0–3.0)
Eosinophils Absolute: 0.1 10*3/uL (ref 0.0–0.7)
Eosinophils Relative: 1.4 % (ref 0.0–5.0)
HCT: 43.1 % (ref 36.0–46.0)
Hemoglobin: 14.5 g/dL (ref 12.0–15.0)
Lymphocytes Relative: 32.6 % (ref 12.0–46.0)
Lymphs Abs: 2 10*3/uL (ref 0.7–4.0)
MCHC: 33.6 g/dL (ref 30.0–36.0)
MCV: 90.9 fl (ref 78.0–100.0)
Monocytes Absolute: 0.3 10*3/uL (ref 0.1–1.0)
Monocytes Relative: 5.3 % (ref 3.0–12.0)
Neutro Abs: 3.6 10*3/uL (ref 1.4–7.7)
Neutrophils Relative %: 60.3 % (ref 43.0–77.0)
Platelets: 261 10*3/uL (ref 150.0–400.0)
RBC: 4.74 Mil/uL (ref 3.87–5.11)
RDW: 13.5 % (ref 11.5–15.5)
WBC: 6 10*3/uL (ref 4.0–10.5)

## 2019-04-20 NOTE — Therapy (Addendum)
[facility] [address] [place], [address] Phone: [phone]   Fax:  [phone]  Physical Therapy Treatment / Discharge Summary Reporting period: 03/17/2019 - 04/20/2019  Patient Details  Name: Kaitlin Dean MRN: [id number] Date of Birth: 02-23-1945 Referring Provider (PT): Alveda Reasons, MD   Encounter Date: 04/20/2019  PT End of Session - 04/20/19 1659    Visit Number  17    Number of Visits  27    Date for PT Re-Evaluation  05/12/19    Authorization Type  [employer] medicare reporting from 03/17/2019    Authorization Time Period  Craig Staggers [id number] 16 visits 03/23/19 - 05/12/19    Authorization - Visit Number  7    Authorization - Number of Visits  16    Progress Note Due on Visit  20    PT Start Time  0347    PT Stop Time  1730    PT Time Calculation (min)  40 min    Activity Tolerance  Patient tolerated treatment well    Behavior During Therapy  [facility] for tasks assessed/performed       Past Medical History:  Diagnosis Date  . 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 Barrett's esophagus   . Chicken pox   . Complication of anesthesia    woke up 15 years ago and felt like could not breathe - no complications   . Diverticulosis of colon without hemorrhage 02/28/2014  . GERD (gastroesophageal reflux disease)   . History of fractured kneecap   . History of fractured pelvis   . History of hiatal hernia   . Hypercholesterolemia   . Hypertension   . Osteoarthritis   . Osteoporosis   . Pneumonia    in past  . 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 Shoulder fracture, right 11/07/2018  . Skin cancer     Past Surgical History:  Procedure Laterality Date  . BREAST EXCISIONAL BIOPSY Right 2000   benign  . BREAST SURGERY  2000   Biopsy  . CATARACT EXTRACTION W/PHACO Right 12/05/2016   Procedure: CATARACT EXTRACTION PHACO AND INTRAOCULAR LENS PLACEMENT ([place]) RIGHT SYMFONY  LENS;  Surgeon: Leandrew Koyanagi, MD;  Location: [place];  Service: Ophthalmology;  Laterality: Right;  . CATARACT EXTRACTION W/PHACO Left 02/06/2017   Procedure: CATARACT EXTRACTION PHACO AND INTRAOCULAR LENS PLACEMENT ([place]) LEFT SYMFONY LENS;  Surgeon: Leandrew Koyanagi, MD;  Location: [place];  Service: Ophthalmology;  Laterality: Left;  . COLONOSCOPY    . ESOPHAGOGASTRODUODENOSCOPY    . ESOPHAGOGASTRODUODENOSCOPY (EGD) WITH PROPOFOL N/A 06/11/2016   Procedure: ESOPHAGOGASTRODUODENOSCOPY (EGD) WITH PROPOFOL;  Surgeon: Manya Silvas, MD;  Location: [facility] END[facility]COPY;  Service: Endoscopy;  Laterality: N/A;  . JOINT REPLACEMENT Left    knee  . PTOSIS REPAIR Bilateral 06/01/2014   Procedure: PTOSIS REPAIR;  Surgeon: Karle Starch, MD;  Location: [place];  Service: Ophthalmology;  Laterality: Bilateral;  . SKIN CANCER EXCISION    . TOTAL KNEE ARTHROPLASTY Left 04/30/2012   Procedure: TOTAL KNEE ARTHROPLASTY;  Surgeon: Gearlean Alf, MD;  Location: [facility];  Service: Orthopedics;  Laterality: Left;  . TUBAL LIGATION      There were no vitals filed for this visit.  Subjective Assessment - 04/21/19 1053    Subjective  Patient report she has no pain upon arrival and has been very busy in her garden over the weekend. She can do everything she  wants to for the most part except does have some overhead restriction in ROM that makes it hard to lift a large box overhead. She saw her referring physician who said she had regained more motion than most people with her condition and he is impressed with her recovery. She states she feels comfortable continuing to work on her ROM and strength at home and is satisifed with her progress at this point. She would today to be her last PT visit. She has no quesitons about her HEP and state she is doing them regularly.    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 osteoporosis    Limitations  Other (comment)   Functional limitations: cannot fix hair or reach top of head in the back, reaching front, back, side, lifting above shoulder, cleaning house, swim backstroke, golfing.   Diagnostic tests  radiograph showed fracture of R proximal humerous, more recent radiograph show good healing per pt    Patient Stated Goals  "to get my arms back like they used to be" "would like to be able to swim backstroke"  Wants to be able to raise R arm completely up and also has pain with extension/internal rotation that she wants to go away.    Currently in Pain?  No/denies    Pain Onset  More than a month ago      OBJECTIVE  PATIENT REPORTED OUTCOMES  FOTO = 69     OBSERVATION/INSPECTION  Posture: forward head, rounded shoulders, slumped in sitting, R shoulder slightly lower than left.  Tremor: none  Muscle bulk: generally overall mildly decreased consistent with lack of regular adequate exercise or physical activity.   Skin:WNL  Bed mobility:WFL.  Transfers:WFL  Gait: grossly WFL for household and short community ambulation. More detailed gait analysis deferred to later date as needed.   PERIPHERAL JOINT MOTION (in degrees)  *Indicates pain, R/L 03/17/19 04/20/19   Joint/Motion AROM AROM Comments  Shoulder     Flexion 90*/130 105/   Extension 50*/64 50/   Abduction 80*/114 90/ mild compensatory shoulder hike  External rotation 28*/65 48/ AROM R side in neutral, PROM R at 90  Internal rotation L5*/T8 L4*/     MUSCLE PERFORMANCE (MMT):  *Indicates pain 03/17/19 04/20/19  Joint/Motion R/L R/L  Shoulder    Flexion 4/4+ 4/4+  Extension / /    Abduction 4/4+ 4+/5-  External rotation 3/4+ 4-/4+  Internal rotation 4+/4+ 4+/4+  Elbow    Flexion 4+/ 5-/  Extension 4+/ 4/  Hand    Grip [facility] WFL   EDUCATION/COGNITION: Patient is alert and oriented X 4.  Objective measurements completed on examination: See above findings.    TREATMENT: 10 min to fill out FOTO (unbilled)  Therapeutic exercise:to centralize symptoms and improve ROM, strength, muscular endurance, and activity tolerance required for successful completion of functional activities.  Hooklying AAROM focusing on R shoulder, using PVC stick, instructed for 5 second holds into temporary discomfort, pt holding about 1 second:  - flexionwith6# on stickx 20 - measurements to assess progress (see above).   Manual therapy:to reduce pain and tissue tension, improve range of motion, neuromodulation, in order to promote improved ability to complete functional activities. - supine  PROM R shoulder flexion, abduction, ER with overpressure as tolerated x20each directions to improve motion.  - R GH joint mobs grade III-IV as tolerated, AP, caudal, distraction in neutral and max abduction.  HOME EXERCISE PROGRAM Access Code: [email or web address] URL: [email or web address] Date: 03/26/2019  Prepared by: Rosita Kea   Exercises Supine Shoulder Flexion with Dowel - 1-2 sets - 20 reps - 5 seconds hold - 1x daily - 7x weekly Supine Shoulder External Rotation in 45 Degrees Abduction AAROM with Dowel - 1-2 sets - 20 reps - 5 seconds hold - 1x daily - 7x weekly Seated Shoulder Abduction AAROM with Pulley Behind - 1-2 sets - 20 reps - 5 seconds hold - 1x daily - 7x weekly Standing Shoulder Extension AAROM with Dowel - 1-2 sets - 20 reps - 5 seconds hold - 1x daily - 7x weekly Standing Shoulder Internal Rotation Stretch with Towel - 1-2 sets - 20 reps - 5 seconds hold - 7x daily - 7x weekly Standing Shoulder Internal Rotation AAROM with Dowel - 1-2 sets -  20 reps - 5 seconds hold - 1x daily - 7x weekly   PT Education - 04/21/19 1055    Education Details  Exercise purpose/form. Self management techniques.    Person(s) Educated  Patient    Methods  Explanation;Verbal cues    Comprehension  Verbalized understanding;Returned demonstration       PT Short Term Goals - 04/20/19 1707      PT SHORT TERM GOAL #1   Title  Be independent with initial home exercise program for self-management of symptoms.    Baseline  initial HEP provided at IE (12/16/2018)    Time  2    Period  Weeks    Status  Achieved    Target Date  03/31/19        PT Long Term Goals - 04/20/19 1707      PT LONG TERM GOAL #1   Title  Be independent with a long-term home exercise program for self-management of symptoms    Baseline  Initial HEP provided at IE (12/16/2018);    Time  8    Period  Weeks    Status  Achieved    Target Date  05/12/19      PT LONG TERM GOAL #2   Title  emonstrate improved FOTO score to equal or greater than 73 to demonstrate improvement in overall condition and self-reported functional ability.    Baseline  FOTO = 55 (12/16/2018); FOTO = 71 (03/17/2019); FOTO = 69    Time  8    Period  Weeks    Status  Partially Met    Target Date  05/12/19      PT LONG TERM GOAL #3   Title  Demonstrate R shoulder AROM equal to or greater than L shoulder AROM with 1/10 or less pain in all planes to allow patient to complete valued activities with less difficulty.    Baseline  see objective measures (12/16/2018); continues to be limited but is better than at initial eval - see objective exam (03/17/2019); continues to be limited but continues to improve - see objective exam (04/20/2019);    Time  8    Period  Weeks    Status  Partially Met    Target Date  05/12/19      PT LONG TERM GOAL #4   Title  Demonstrate R shoulder MMT 4/5 or greater to improve ability to perform reaching, carrying, and lifting tasks.  Baseline  testing deferred due to  precautions (12/16/2018); see testing in objective exam (03/17/2019); nearly met - see objective exam (04/20/2019);    Time  8    Period  Weeks    Status  Partially Met    Target Date  05/12/19      PT LONG TERM GOAL #5   Title  Complete community, work and/or recreational activities without limitation due to current condition.    Baseline  Functional limitations: cannot fix hair or reach top of head in the back, was restricted in eating with R UE, driving, pushing, seatbelt, reaching, lifting above shoulder, dressing and bathing, gardening, cleaning house, swim backstroke, cooking (12/16/2018); cannot fix hair or reach top of head in the back, reaching front, back, side, lifting above shoulder, cleaning house, swim backstroke, golfing, grooming (03/17/2019); pt is satisfied with her improvements but does have some trouble putting something heavy on the top shelf (04/20/2019);    Time  8    Period  Weeks    Status  Partially Met    Target Date  05/12/19      PT LONG TERM GOAL #6   Title  Patient will report improvement in PSFS to at least 9/10 in each category (golf, swimming, combing hair in the back), to demonstrate improved function in areas that patient values.    Baseline  swimming = 6/10, golf 7/10, combing hair in back = 7/10 (03/17/2019); swimming = 5/10, golf 6/10, combing hair in back = 7/10 (04/20/2019);    Time  8    Period  Weeks    Status  Not Met    Target Date  05/12/19            Plan - 04/21/19 1105    Clinical Impression Statement  Patient has attended 17 physical therapy sessions and demonstrates overall improvement in her condition. Since returning to physical therapy in February, she demonstrates improved AROM and strength in the R shoulder and is participating well in an appropriate long term HEP. Although her patient reported outcomes have not improved, patient reports improved ability to reach the back of her head, complete her usual activities and has requested  discharge due to feeling that she has improved sufficiently is ready to independently continue working towards improvement. Patient will now be discharged from physical therapy for this reason.    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    Examination-Activity Limitations  Bathing;Lift;Reach Overhead;Carry;Dressing;Hygiene/Grooming;Self Feeding;Other   cannot fix hair or reach top of head in the back, was restricted in eating with R UE, driving, pushing, seatbelt, reaching, lifting above shoulder, dressing and bathing, gardening, cleaning house, swim backstroke, cooking.   Examination-Participation Restrictions  Community Activity;Driving;Laundry;Meal Prep;Yard Work    Stability/Clinical Decision Making  Stable/Uncomplicated    Rehab Potential  Good    PT Frequency  2x / week    PT Duration  8 weeks    PT Treatment/Interventions  ADLs/Self Care Home Management;Aquatic Therapy;Electrical Stimulation;Cryotherapy;Moist Heat;Functional mobility training;Therapeutic activities;Therapeutic exercise;Neuromuscular re-education;Patient/family education;Manual techniques;Passive range of motion;Dry needling;Spinal Manipulations;Joint Manipulations    PT Next Visit Plan  Patient is now discharged from physical therapy due  to self discharged 2/2 satisfaction with progress    PT Home Exercise Plan  Medbridge Access Code: [email or web address]    Consulted and Agree with Plan of Care  Patient       Patient will benefit from skilled therapeutic intervention in order to improve the following  deficits and impairments:  Increased fascial restricitons, Pain, Improper body mechanics, Postural dysfunction, Increased muscle spasms, Decreased mobility, Decreased coordination, Decreased activity tolerance, Decreased endurance, Decreased range of motion, Decreased strength, Hypomobility, Impaired perceived functional ability, Impaired UE functional use, Impaired flexibility, Decreased knowledge of precautions  Visit Diagnosis: Acute pain of right shoulder  Stiffness of right shoulder, not elsewhere classified  Muscle weakness (generalized)     Problem List Patient Active Problem List   Diagnosis Date Noted  . Abnormal cervical Papanicolaou smear 04/08/2019  . Osteoporosis, post-menopausal 04/08/2019  . Controlled diabetes mellitus type 2 with complications ([place]) 78/67/6720  . Humerus fracture 02/24/2019  . Large hiatal hernia 11/07/2017  . Aortic atherosclerosis ([place]) 08/28/2016  . Throat irritation 02/20/2015  . Hyperglycemia 09/01/2014  . Health care maintenance 08/07/2014  . Diverticulosis of colon without hemorrhage 02/28/2014  . Breast pain, right 11/12/2013  . Breast thickening 11/12/2013  . Heme positive stool 10/27/2013  . History of diverticulitis of colon 10/27/2013  . Irregular heart rhythm 02/22/2013  . Cardiac dysrhythmia 02/22/2013  . OA (osteoarthritis) of knee 04/30/2012  . Fractured pelvis ([place]) 04/29/2012  . Knee fracture 04/29/2012  . Skin cancer 02/10/2012  . Hypertension 02/04/2012  . Hypercholesterolemia 02/04/2012  . Hiatal hernia with GERD 02/04/2012  . History of HPV infection 02/04/2012  . Barrett's esophagus 02/04/2012  . Primary localized osteoarthrosis, lower leg 11/22/2011    Everlean Alstrom. Graylon Good, PT, DPT 04/21/19, 11:07 AM  [facility] [address], [place], [address] Phone: [phone]   Fax:  [phone]  Name: Kaitlin Dean MRN: [id number] Date of Birth:  10-15-1945

## 2019-04-21 ENCOUNTER — Encounter: Payer: Self-pay | Admitting: Physical Therapy

## 2019-04-21 ENCOUNTER — Encounter: Payer: Self-pay | Admitting: Internal Medicine

## 2019-04-21 ENCOUNTER — Telehealth: Payer: Self-pay | Admitting: Internal Medicine

## 2019-04-21 NOTE — Telephone Encounter (Signed)
It is too soon to have a repeat a1c. (a1c is a 3 month average of blood sugars).  I do not think she need to come in now.  We usually have pts work on diet and exercise and follow the sugars.  See me about this

## 2019-04-21 NOTE — Telephone Encounter (Signed)
Patient had labs yesterday and wanted to have her Glucose checked too. Patient wants to know her numbers.

## 2019-04-21 NOTE — Telephone Encounter (Signed)
Called patient to let her know that she only had a cbc drawn this time to f/u on her white count. This does not include glucose. Patient stated that she has been dieting and working hard on exercising and wanted to know what her glucose level is. Explained to her that she just had her labs done 03/18/19 and it may be too soon to see any results. Her glucose was 116. A1C was elevated. Patient still requested to forward message to Dr Nicki Reaper to make sure she agrees with this and how soon she can have her labs rechecked.

## 2019-04-22 ENCOUNTER — Ambulatory Visit: Payer: Medicare PPO | Admitting: Physical Therapy

## 2019-04-22 NOTE — Telephone Encounter (Signed)
Pt aware.

## 2019-04-27 ENCOUNTER — Ambulatory Visit: Payer: Medicare PPO | Admitting: Physical Therapy

## 2019-04-30 ENCOUNTER — Encounter: Payer: Medicare PPO | Admitting: Physical Therapy

## 2019-05-05 ENCOUNTER — Encounter: Payer: Medicare PPO | Admitting: Physical Therapy

## 2019-05-07 ENCOUNTER — Encounter: Payer: Medicare PPO | Admitting: Physical Therapy

## 2019-05-12 ENCOUNTER — Encounter: Payer: Medicare PPO | Admitting: Physical Therapy

## 2019-05-14 ENCOUNTER — Encounter: Payer: Self-pay | Admitting: Gastroenterology

## 2019-05-14 ENCOUNTER — Other Ambulatory Visit: Payer: Self-pay

## 2019-05-14 DIAGNOSIS — C44712 Basal cell carcinoma of skin of right lower limb, including hip: Secondary | ICD-10-CM | POA: Diagnosis not present

## 2019-05-19 ENCOUNTER — Encounter: Payer: Medicare PPO | Admitting: Physical Therapy

## 2019-05-21 ENCOUNTER — Other Ambulatory Visit
Admission: RE | Admit: 2019-05-21 | Discharge: 2019-05-21 | Disposition: A | Discharge status: Home / Self Care | Payer: Medicare PPO | Source: Ambulatory Visit | Attending: Gastroenterology | Admitting: Gastroenterology

## 2019-05-21 ENCOUNTER — Other Ambulatory Visit: Payer: Self-pay

## 2019-05-21 ENCOUNTER — Encounter: Payer: Medicare PPO | Admitting: Physical Therapy

## 2019-05-21 DIAGNOSIS — Z01812 Encounter for preprocedural laboratory examination: Secondary | ICD-10-CM | POA: Diagnosis not present

## 2019-05-21 DIAGNOSIS — Z20822 Contact with and (suspected) exposure to covid-19: Secondary | ICD-10-CM | POA: Diagnosis not present

## 2019-05-21 LAB — SARS CORONAVIRUS 2 (TAT 6-24 HRS): SARS Coronavirus 2: NEGATIVE

## 2019-05-22 NOTE — Discharge Instructions (Signed)
General Anesthesia, Adult, Care After This sheet gives you information about how to care for yourself after your procedure. Your health care provider may also give you more specific instructions. If you have problems or questions, contact your health care provider. What can I expect after the procedure? After the procedure, the following side effects are common:  Pain or discomfort at the IV site.  Nausea.  Vomiting.  Sore throat.  Trouble concentrating.  Feeling cold or chills.  Weak or tired.  Sleepiness and fatigue.  Soreness and body aches. These side effects can affect parts of the body that were not involved in surgery. Follow these instructions at home:  For at least 24 hours after the procedure:  Have a responsible adult stay with you. It is important to have someone help care for you until you are awake and alert.  Rest as needed.  Do not: ? Participate in activities in which you could fall or become injured. ? Drive. ? Use heavy machinery. ? Drink alcohol. ? Take sleeping pills or medicines that cause drowsiness. ? Make important decisions or sign legal documents. ? Take care of children on your own. Eating and drinking  Follow any instructions from your health care provider about eating or drinking restrictions.  When you feel hungry, start by eating small amounts of foods that are soft and easy to digest (bland), such as toast. Gradually return to your regular diet.  Drink enough fluid to keep your urine pale yellow.  If you vomit, rehydrate by drinking water, juice, or clear broth. General instructions  If you have sleep apnea, surgery and certain medicines can increase your risk for breathing problems. Follow instructions from your health care provider about wearing your sleep device: ? Anytime you are sleeping, including during daytime naps. ? While taking prescription pain medicines, sleeping medicines, or medicines that make you drowsy.  Return to  your normal activities as told by your health care provider. Ask your health care provider what activities are safe for you.  Take over-the-counter and prescription medicines only as told by your health care provider.  If you smoke, do not smoke without supervision.  Keep all follow-up visits as told by your health care provider. This is important. Contact a health care provider if:  You have nausea or vomiting that does not get better with medicine.  You cannot eat or drink without vomiting.  You have pain that does not get better with medicine.  You are unable to pass urine.  You develop a skin rash.  You have a fever.  You have redness around your IV site that gets worse. Get help right away if:  You have difficulty breathing.  You have chest pain.  You have blood in your urine or stool, or you vomit blood. Summary  After the procedure, it is common to have a sore throat or nausea. It is also common to feel tired.  Have a responsible adult stay with you for the first 24 hours after general anesthesia. It is important to have someone help care for you until you are awake and alert.  When you feel hungry, start by eating small amounts of foods that are soft and easy to digest (bland), such as toast. Gradually return to your regular diet.  Drink enough fluid to keep your urine pale yellow.  Return to your normal activities as told by your health care provider. Ask your health care provider what activities are safe for you. This information is not   intended to replace advice given to you by your health care provider. Make sure you discuss any questions you have with your health care provider. Document Revised: 01/18/2017 Document Reviewed: 08/31/2016 Elsevier Patient Education  2020 Elsevier Inc[employer]  

## 2019-05-25 ENCOUNTER — Encounter
Admission: RE | Disposition: A | Discharge status: Home / Self Care | Payer: Self-pay | Source: Home / Self Care | Attending: Gastroenterology

## 2019-05-25 ENCOUNTER — Encounter: Payer: Self-pay | Admitting: Gastroenterology

## 2019-05-25 ENCOUNTER — Other Ambulatory Visit: Payer: Self-pay

## 2019-05-25 ENCOUNTER — Ambulatory Visit: Payer: Medicare PPO | Admitting: Anesthesiology

## 2019-05-25 ENCOUNTER — Ambulatory Visit
Admission: RE | Admit: 2019-05-25 | Discharge: 2019-05-25 | Disposition: A | Discharge status: Home / Self Care | Payer: Medicare PPO | Attending: Gastroenterology | Admitting: Gastroenterology

## 2019-05-25 DIAGNOSIS — Z79899 Other long term (current) drug therapy: Secondary | ICD-10-CM | POA: Diagnosis not present

## 2019-05-25 DIAGNOSIS — Z881 Allergy status to other antibiotic agents status: Secondary | ICD-10-CM | POA: Insufficient documentation

## 2019-05-25 DIAGNOSIS — Z13810 Encounter for screening for upper gastrointestinal disorder: Secondary | ICD-10-CM | POA: Diagnosis not present

## 2019-05-25 DIAGNOSIS — Z96652 Presence of left artificial knee joint: Secondary | ICD-10-CM | POA: Diagnosis not present

## 2019-05-25 DIAGNOSIS — K228 Other specified diseases of esophagus: Secondary | ICD-10-CM | POA: Diagnosis not present

## 2019-05-25 DIAGNOSIS — K449 Diaphragmatic hernia without obstruction or gangrene: Secondary | ICD-10-CM | POA: Insufficient documentation

## 2019-05-25 DIAGNOSIS — Z85828 Personal history of other malignant neoplasm of skin: Secondary | ICD-10-CM | POA: Diagnosis not present

## 2019-05-25 DIAGNOSIS — I10 Essential (primary) hypertension: Secondary | ICD-10-CM | POA: Insufficient documentation

## 2019-05-25 DIAGNOSIS — K2289 Other specified disease of esophagus: Secondary | ICD-10-CM

## 2019-05-25 DIAGNOSIS — K317 Polyp of stomach and duodenum: Secondary | ICD-10-CM

## 2019-05-25 DIAGNOSIS — K2270 Barrett's esophagus without dysplasia: Secondary | ICD-10-CM | POA: Insufficient documentation

## 2019-05-25 DIAGNOSIS — K219 Gastro-esophageal reflux disease without esophagitis: Secondary | ICD-10-CM | POA: Diagnosis not present

## 2019-05-25 DIAGNOSIS — E7800 Pure hypercholesterolemia, unspecified: Secondary | ICD-10-CM | POA: Insufficient documentation

## 2019-05-25 DIAGNOSIS — J45909 Unspecified asthma, uncomplicated: Secondary | ICD-10-CM | POA: Insufficient documentation

## 2019-05-25 HISTORY — PX: ESOPHAGOGASTRODUODENOSCOPY (EGD) WITH PROPOFOL: SHX5813

## 2019-05-25 SURGERY — ESOPHAGOGASTRODUODENOSCOPY (EGD) WITH PROPOFOL
Anesthesia: General | Site: Mouth

## 2019-05-25 MED ORDER — LIDOCAINE HCL (CARDIAC) PF 100 MG/5ML IV SOSY
PREFILLED_SYRINGE | INTRAVENOUS | Status: DC | PRN
  Administered 2019-05-25: 30 mg via INTRAVENOUS

## 2019-05-25 MED ORDER — ACETAMINOPHEN 160 MG/5ML PO SOLN: 325.0000 mg | ORAL | Status: DC | PRN

## 2019-05-25 MED ORDER — ACETAMINOPHEN 325 MG PO TABS: 325.0000 mg | ORAL_TABLET | ORAL | Status: DC | PRN

## 2019-05-25 MED ORDER — ONDANSETRON HCL 4 MG/2ML IJ SOLN: 4.0000 mg | Freq: Once | INTRAMUSCULAR | Status: DC | PRN

## 2019-05-25 MED ORDER — GLYCOPYRROLATE 0.2 MG/ML IJ SOLN
INTRAMUSCULAR | Status: DC | PRN
  Administered 2019-05-25: .1 mg via INTRAVENOUS

## 2019-05-25 MED ORDER — PROPOFOL 10 MG/ML IV BOLUS
INTRAVENOUS | Status: DC | PRN
  Administered 2019-05-25: 150 mg via INTRAVENOUS
  Administered 2019-05-25: 50 mg via INTRAVENOUS

## 2019-05-25 MED ORDER — LACTATED RINGERS IV SOLN: INTRAVENOUS | Status: DC

## 2019-05-25 SURGICAL SUPPLY — 6 items
BLOCK BITE 60FR ADLT L/F GRN (MISCELLANEOUS) ×3 IMPLANT
FORCEPS BIOP RAD 4 LRG CAP 4 (CUTTING FORCEPS) ×3 IMPLANT
GOWN CVR UNV OPN BCK APRN NK (MISCELLANEOUS) ×2 IMPLANT
GOWN ISOL THUMB LOOP REG UNIV (MISCELLANEOUS) ×4
KIT ENDO PROCEDURE OLY (KITS) ×3 IMPLANT
WATER STERILE IRR 250ML POUR (IV SOLUTION) ×3 IMPLANT

## 2019-05-25 NOTE — Transfer of Care (Signed)
Immediate Anesthesia Transfer of Care Note  Patient: Kaitlin Dean  Procedure(s) Performed: ESOPHAGOGASTRODUODENOSCOPY (EGD) WITH BIOPSY (N/A Mouth)  Patient Location: PACU  Anesthesia Type: General  Level of Consciousness: awake, alert  and patient cooperative  Airway and Oxygen Therapy: Patient Spontanous Breathing and Patient connected to supplemental oxygen  Post-op Assessment: Post-op Vital signs reviewed, Patient's Cardiovascular Status Stable, Respiratory Function Stable, Patent Airway and No signs of Nausea or vomiting  Post-op Vital Signs: Reviewed and stable  Complications: No apparent anesthesia complications

## 2019-05-25 NOTE — H&P (Signed)
Lucilla Lame, MD [address] [place], [place] [address] Phone:[phone] Fax : [phone]  Primary Care Physician:  Einar Pheasant, MD Primary Gastroenterologist:  Dr. Allen Norris  Pre-Procedure History & Physical: HPI:  Kaitlin Dean is a 74 y.o. female is here for an endoscopy.   Past Medical History:  Diagnosis Date  . 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 Barrett's esophagus   . Chicken pox   . Complication of anesthesia    woke up 15 years ago and felt like could not breathe - no complications   . Diverticulosis of colon without hemorrhage 02/28/2014  . GERD (gastroesophageal reflux disease)   . History of fractured kneecap   . History of fractured pelvis   . History of hiatal hernia   . Hypercholesterolemia   . Hypertension   . Osteoarthritis   . Osteoporosis   . Pneumonia    in past  . 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 Shoulder fracture, right 11/07/2018  . Skin cancer     Past Surgical History:  Procedure Laterality Date  . BREAST EXCISIONAL BIOPSY Right 2000   benign  . BREAST SURGERY  2000   Biopsy  . CATARACT EXTRACTION W/PHACO Right 12/05/2016   Procedure: CATARACT EXTRACTION PHACO AND INTRAOCULAR LENS PLACEMENT ([place]) RIGHT SYMFONY LENS;  Surgeon: Leandrew Koyanagi, MD;  Location: [place];  Service: Ophthalmology;  Laterality: Right;  . CATARACT EXTRACTION W/PHACO Left 02/06/2017   Procedure: CATARACT EXTRACTION PHACO AND INTRAOCULAR LENS PLACEMENT ([place]) LEFT SYMFONY LENS;  Surgeon: Leandrew Koyanagi, MD;  Location: [place];  Service: Ophthalmology;  Laterality: Left;  . COLONOSCOPY    . ESOPHAGOGASTRODUODENOSCOPY    . ESOPHAGOGASTRODUODENOSCOPY (EGD) WITH PROPOFOL N/A 06/11/2016   Procedure: ESOPHAGOGASTRODUODENOSCOPY (EGD) WITH PROPOFOL;  Surgeon: Manya Silvas, MD;  Location: [facility];  Service: Endoscopy;  Laterality: N/A;  . JOINT REPLACEMENT Left    knee  . PTOSIS REPAIR  Bilateral 06/01/2014   Procedure: PTOSIS REPAIR;  Surgeon: Karle Starch, MD;  Location: [place];  Service: Ophthalmology;  Laterality: Bilateral;  . SKIN CANCER EXCISION    . TOTAL KNEE ARTHROPLASTY Left 04/30/2012   Procedure: TOTAL KNEE ARTHROPLASTY;  Surgeon: Gearlean Alf, MD;  Location: [facility];  Service: Orthopedics;  Laterality: Left;  . TUBAL LIGATION      Prior to Admission medications   Medication Sig Start Date End Date Taking? Authorizing Provider  albuterol (PROVENTIL HFA;VENTOLIN HFA) 108 (90 Base) MCG/ACT inhaler Inhale 2 puffs into the lungs every 6 (six) hours as needed for wheezing or shortness of breath. 02/24/18  Yes Guse, Jacquelynn Cree, FNP  Cholecalciferol (VITAMIN D3 PO) Take by mouth daily.   Yes [provider]  ezetimibe-simvastatin (VYTORIN) 10-20 MG tablet TAKE ONE TABLET AT BEDTIME 03/09/19  Yes Einar Pheasant, MD  Multiple Vitamin (MULTIVITAMIN) capsule Take 1 capsule by mouth daily. AM   Yes [provider]  omeprazole (PRILOSEC) 20 MG capsule Take 20 mg by mouth daily.   Yes [provider]  telmisartan (MICARDIS) 40 MG tablet TAKE ONE TABLET EVERY MORNING 03/09/19  Yes Einar Pheasant, MD  vitamin B-12 (CYANOCOBALAMIN) 1000 MCG tablet Take 1,000 mcg by mouth daily.   Yes [provider]  Vitamin E 400 units TABS Take by mouth daily.   Yes [provider]    Allergies as of 04/10/2019 - Review Complete 04/08/2019  Allergen Reaction Noted  . Flagyl [metronidazole] Nausea And Vomiting 10/01/2013  Family History  Problem Relation Age of Onset  . Hypertension Mother   . Congestive Heart Failure Mother   . Stroke Father   . Stroke Brother   . Heart failure Brother   . Sarcoidosis Sister        died of kidney failure  . Breast cancer Neg Hx   . Colon cancer Neg Hx     Social History   Socioeconomic History  . Marital status: Widowed    Spouse name: Not on file  . Number of children: 2  . Years of  education: Not on file  . Highest education level: Not on file  Occupational History    Comment: Retired Statistician  Tobacco Use  . Smoking status: Never Smoker  . Smokeless tobacco: Never Used  Substance and Sexual Activity  . Alcohol use: Yes    Alcohol/week: 1.0 standard drinks    Types: 1 Glasses of wine per week    Comment: SOCIALLY  . Drug use: No  . Sexual activity: Not Currently  Other Topics Concern  . 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 Difficulty of Paying Living Expenses:   Food Insecurity:   . Worried About Charity fundraiser in the Last Year:   . Arboriculturist in the Last Year:   Transportation Needs:   . Film/video editor (Medical):   Marland Kitchen Lack of Transportation (Non-Medical):   Physical Activity:   . Days of Exercise per Week:   . Minutes of Exercise per Session:   Stress:   . Feeling of Stress :   Social Connections:   . Frequency of Communication with Friends and Family:   . Frequency of Social Gatherings with Friends and Family:   . Attends Religious Services:   . Active Member of Clubs or Organizations:   . Attends Archivist Meetings:   Marland Kitchen Marital Status:   Intimate Partner Violence:   . Fear of Current or Ex-Partner:   . Emotionally Abused:   Marland Kitchen Physically Abused:   . Sexually Abused:     Review of Systems: See HPI, otherwise negative ROS  Physical Exam: Ht 5\' 3"  (1.6 m)   Wt 84.8 kg   LMP 01/29/1993   BMI 33.13 kg/m  General:   Alert,  pleasant and cooperative in NAD Head:  Normocephalic and atraumatic. Neck:  Supple; no masses or thyromegaly. Lungs:  Clear throughout to auscultation.      Heart:  Regular rate and rhythm. Abdomen:  Soft, nontender and nondistended. Normal bowel sounds, without guarding, and without rebound.   Neurologic:  Alert and  oriented x4;  grossly normal neurologically.  Impression/Plan: Cherie Ouch is here for an endoscopy to be performed for barrett's esophagus  Risks, benefits, limitations, and alternatives regarding  endoscopy have been reviewed with the patient.  Questions have been answered.  All parties agreeable.   Lucilla Lame, MD  05/25/2019, 8:24 AM

## 2019-05-25 NOTE — Anesthesia Preprocedure Evaluation (Signed)
Anesthesia Evaluation  Patient identified by MRN, date of birth, ID band Patient awake    History of Anesthesia Complications Negative for: history of anesthetic complications  Airway Mallampati: II  TM Distance: >3 FB Neck ROM: Full    Dental no notable dental hx.    Pulmonary asthma ,    Pulmonary exam normal        Cardiovascular Exercise Tolerance: Good hypertension, Normal cardiovascular exam     Neuro/Psych negative neurological ROS     GI/Hepatic Neg liver ROS, GERD  Medicated,  Endo/Other  negative endocrine ROS  Renal/GU negative Renal ROS     Musculoskeletal negative musculoskeletal ROS (+)   Abdominal   Peds  Hematology negative hematology ROS (+)   Anesthesia Other Findings   Reproductive/Obstetrics                           Anesthesia Physical Anesthesia Plan  ASA: II  Anesthesia Plan: General   Post-op Pain Management:    Induction: Intravenous  PONV Risk Score and Plan: 3 and Propofol infusion, TIVA and Treatment may vary due to age or medical condition  Airway Management Planned: Nasal Cannula and Natural Airway  Additional Equipment: None  Intra-op Plan:   Post-operative Plan:   Informed Consent:   Plan Discussed with: CRNA  Anesthesia Plan Comments:         Anesthesia Quick Evaluation

## 2019-05-25 NOTE — Op Note (Signed)
[facility] Gastroenterology Patient Name: Kaitlin Dean Procedure Date: 05/25/2019 9:11 AM MRN: [id number] Account #: [id number] Date of Birth: 12/03/1945 Admit Type: Outpatient Age: 74 Room: [facility] OR ROOM 01 Gender: Female Note Status: Finalized Procedure:             Upper GI endoscopy Indications:           Screening for Barrett's esophagus Providers:             Lucilla Lame MD, MD Referring MD:          Einar Pheasant, MD (Referring MD) Medicines:             Propofol per Anesthesia Complications:         No immediate complications. Procedure:             Pre-Anesthesia Assessment:                        - Prior to the procedure, a History and Physical was                         performed, and patient medications and allergies were                         reviewed. The patient's tolerance of previous                         anesthesia was also reviewed. The risks and benefits                         of the procedure and the sedation options and risks                         were discussed with the patient. All questions were                         answered, and informed consent was obtained. Prior                         Anticoagulants: The patient has taken no previous                         anticoagulant or antiplatelet agents. ASA Grade                         Assessment: II - A patient with mild systemic disease.                         After reviewing the risks and benefits, the patient                         was deemed in satisfactory condition to undergo the                         procedure.                        After obtaining informed consent, the endoscope was  passed under direct vision. Throughout the procedure,                         the patient's blood pressure, pulse, and oxygen                         saturations were monitored continuously. The                         Endosonoscope was introduced through the  mouth, and                         advanced to the second part of duodenum. The upper GI                         endoscopy was accomplished without difficulty. The                         patient tolerated the procedure well. Findings:      A medium-sized hiatal hernia was present.      There were esophageal mucosal changes suspicious for short-segment       Barrett's esophagus present in the lower third of the esophagus. This       was biopsied with a cold forceps for histology.      Multiple pedunculated and sessile polyps with no bleeding and no       stigmata of recent bleeding were found in the gastric fundus.      The examined duodenum was normal. Impression:            - Medium-sized hiatal hernia.                        - Esophageal mucosal changes suspicious for                         short-segment Barrett's esophagus. Biopsied.                        - Multiple gastric polyps.                        - Normal examined duodenum. Recommendation:        - Discharge patient to home.                        - Resume previous diet.                        - Continue present medications.                        - Await pathology results. Procedure Code(s):     --- Professional ---                        [phone], Esophagogastroduodenoscopy, flexible,                         transoral; with biopsy, single or multiple Diagnosis Code(s):     --- Professional ---  Z13.810, Encounter for screening for upper                         gastrointestinal disorder                        K31.7, Polyp of stomach and duodenum                        K22.8, Other specified diseases of esophagus CPT copyright 2019 American Medical Association. All rights reserved. The codes documented in this report are preliminary and upon coder review may  be revised to meet current compliance requirements. Lucilla Lame MD, MD 05/25/2019 9:30:56 AM This report has been signed electronically. Number  of Addenda: 0 Note Initiated On: 05/25/2019 9:11 AM Total Procedure Duration: 0 hours 4 minutes 11 seconds  Estimated Blood Loss:  Estimated blood loss: none.      [employer]

## 2019-05-25 NOTE — Anesthesia Postprocedure Evaluation (Signed)
Anesthesia Post Note  Patient: Kaitlin Dean  Procedure(s) Performed: ESOPHAGOGASTRODUODENOSCOPY (EGD) WITH BIOPSY (N/A Mouth)     Patient location during evaluation: PACU Anesthesia Type: General Level of consciousness: awake and alert Pain management: pain level controlled Vital Signs Assessment: post-procedure vital signs reviewed and stable Respiratory status: spontaneous breathing, nonlabored ventilation, respiratory function stable and patient connected to nasal cannula oxygen Cardiovascular status: blood pressure returned to baseline and stable Postop Assessment: no apparent nausea or vomiting Anesthetic complications: no    Adele Barthel Daeron Carreno

## 2019-05-26 ENCOUNTER — Encounter: Payer: Medicare PPO | Admitting: Physical Therapy

## 2019-05-26 ENCOUNTER — Encounter: Payer: Self-pay | Admitting: *Deleted

## 2019-05-26 LAB — SURGICAL PATHOLOGY

## 2019-05-27 ENCOUNTER — Encounter: Payer: Self-pay | Admitting: Gastroenterology

## 2019-05-28 ENCOUNTER — Encounter: Payer: Medicare PPO | Admitting: Physical Therapy

## 2019-07-06 ENCOUNTER — Other Ambulatory Visit: Payer: Self-pay | Admitting: Internal Medicine

## 2019-07-06 DIAGNOSIS — Z1231 Encounter for screening mammogram for malignant neoplasm of breast: Secondary | ICD-10-CM

## 2019-07-15 ENCOUNTER — Other Ambulatory Visit: Payer: Self-pay

## 2019-07-15 MED ORDER — TELMISARTAN 40 MG PO TABS: 40.0000 mg | ORAL_TABLET | Freq: Every morning | ORAL | 1 refills | Status: DC

## 2019-08-19 ENCOUNTER — Ambulatory Visit
Admission: RE | Admit: 2019-08-19 | Discharge: 2019-08-19 | Disposition: A | Discharge status: Home / Self Care | Payer: Medicare PPO | Source: Ambulatory Visit | Attending: Internal Medicine | Admitting: Internal Medicine

## 2019-08-19 DIAGNOSIS — Z1231 Encounter for screening mammogram for malignant neoplasm of breast: Secondary | ICD-10-CM | POA: Insufficient documentation

## 2019-08-26 ENCOUNTER — Encounter: Payer: Medicare PPO | Admitting: Internal Medicine

## 2019-09-02 ENCOUNTER — Ambulatory Visit (INDEPENDENT_AMBULATORY_CARE_PROVIDER_SITE_OTHER): Payer: Medicare PPO | Admitting: Internal Medicine

## 2019-09-02 ENCOUNTER — Other Ambulatory Visit: Payer: Self-pay

## 2019-09-02 VITALS — BP 128/78 | HR 78 | Temp 97.6°F | Resp 16 | Ht 63.0 in | Wt 181.0 lb

## 2019-09-02 DIAGNOSIS — K2270 Barrett's esophagus without dysplasia: Secondary | ICD-10-CM | POA: Diagnosis not present

## 2019-09-02 DIAGNOSIS — R739 Hyperglycemia, unspecified: Secondary | ICD-10-CM | POA: Diagnosis not present

## 2019-09-02 DIAGNOSIS — Z0000 Encounter for general adult medical examination without abnormal findings: Secondary | ICD-10-CM | POA: Diagnosis not present

## 2019-09-02 DIAGNOSIS — E118 Type 2 diabetes mellitus with unspecified complications: Secondary | ICD-10-CM

## 2019-09-02 DIAGNOSIS — Z8619 Personal history of other infectious and parasitic diseases: Secondary | ICD-10-CM | POA: Diagnosis not present

## 2019-09-02 DIAGNOSIS — E7800 Pure hypercholesterolemia, unspecified: Secondary | ICD-10-CM

## 2019-09-02 DIAGNOSIS — I700 Atherosclerosis of aorta: Secondary | ICD-10-CM

## 2019-09-02 DIAGNOSIS — I10 Essential (primary) hypertension: Secondary | ICD-10-CM

## 2019-09-02 LAB — HEPATIC FUNCTION PANEL
ALT: 21 U/L (ref 0–35)
AST: 22 U/L (ref 0–37)
Albumin: 4.6 g/dL (ref 3.5–5.2)
Alkaline Phosphatase: 92 U/L (ref 39–117)
Bilirubin, Direct: 0.2 mg/dL (ref 0.0–0.3)
Total Bilirubin: 1 mg/dL (ref 0.2–1.2)
Total Protein: 6.9 g/dL (ref 6.0–8.3)

## 2019-09-02 LAB — BASIC METABOLIC PANEL
BUN: 20 mg/dL (ref 6–23)
CO2: 27 mEq/L (ref 19–32)
Calcium: 9.7 mg/dL (ref 8.4–10.5)
Chloride: 105 mEq/L (ref 96–112)
Creatinine, Ser: 0.87 mg/dL (ref 0.40–1.20)
GFR: 63.24 mL/min (ref >60.00)
Glucose, Bld: 111 mg/dL — ABNORMAL HIGH (ref 70–99)
Potassium: 4.1 mEq/L (ref 3.5–5.1)
Sodium: 138 mEq/L (ref 135–145)

## 2019-09-02 LAB — LIPID PANEL
Cholesterol: 142 mg/dL (ref 0–200)
HDL: 42.6 mg/dL (ref >39.00)
LDL Cholesterol: 80 mg/dL (ref 0–99)
NonHDL: 99.85
Total CHOL/HDL Ratio: 3
Triglycerides: 101 mg/dL (ref 0.0–149.0)
VLDL: 20.2 mg/dL (ref 0.0–40.0)

## 2019-09-02 LAB — MICROALBUMIN / CREATININE URINE RATIO
Creatinine,U: 93.2 mg/dL
Microalb Creat Ratio: 0.8 mg/g (ref 0.0–30.0)
Microalb, Ur: <0.7 mg/dL (ref 0.0–1.9)

## 2019-09-02 LAB — TSH: TSH: 3.32 u[IU]/mL (ref 0.35–4.50)

## 2019-09-02 LAB — HEMOGLOBIN A1C: Hgb A1c MFr Bld: 6 % (ref 4.6–6.5)

## 2019-09-02 NOTE — Progress Notes (Signed)
Patient ID: Kaitlin Dean, female   DOB: 06-01-1945, 74 y.o.   MRN: [id number]   Subjective:    Patient ID: Kaitlin Dean, female    DOB: 05/12/1945, 74 y.o.   MRN: [id number]  HPI This visit occurred during the SARS-CoV-2 public health emergency.  Safety protocols were in place, including screening questions prior to the visit, additional usage of staff PPE, and extensive cleaning of exam room while observing appropriate contact time as indicated for disinfecting solutions.  Patient here for her physical exam.  She is doing well.  Feels good.  Staying active.  Has adjusted her diet.  Lost weight.  Acid reflux is better.  S/p recent EGD.  Seeing Dr Allen Norris.  No chest pain or sob.  No cough or congestion.  No abdominal pain.  Bowels moving.  Declines f/u pap.  Discussed with her today and discussed history of HPV.  Declines f/u pap.     Past Medical History:  Diagnosis Date  . 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 Barrett's esophagus   . Chicken pox   . Complication of anesthesia    woke up 15 years ago and felt like could not breathe - no complications   . Diverticulosis of colon without hemorrhage 02/28/2014  . GERD (gastroesophageal reflux disease)   . History of fractured kneecap   . History of fractured pelvis   . History of hiatal hernia   . Hypercholesterolemia   . Hypertension   . Osteoarthritis   . Osteoporosis   . Pneumonia    in past  . 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 Shoulder fracture, right 11/07/2018  . Skin cancer    Past Surgical History:  Procedure Laterality Date  . BREAST EXCISIONAL BIOPSY Right 2000   benign  . BREAST SURGERY  2000   Biopsy  . CATARACT EXTRACTION W/PHACO Right 12/05/2016   Procedure: CATARACT EXTRACTION PHACO AND INTRAOCULAR LENS PLACEMENT ([place]) RIGHT SYMFONY LENS;  Surgeon: Leandrew Koyanagi, MD;  Location: [place];  Service: Ophthalmology;  Laterality: Right;  . CATARACT EXTRACTION W/PHACO Left 02/06/2017    Procedure: CATARACT EXTRACTION PHACO AND INTRAOCULAR LENS PLACEMENT ([place]) LEFT SYMFONY LENS;  Surgeon: Leandrew Koyanagi, MD;  Location: [place];  Service: Ophthalmology;  Laterality: Left;  . COLONOSCOPY    . ESOPHAGOGASTRODUODENOSCOPY    . ESOPHAGOGASTRODUODENOSCOPY (EGD) WITH PROPOFOL N/A 06/11/2016   Procedure: ESOPHAGOGASTRODUODENOSCOPY (EGD) WITH PROPOFOL;  Surgeon: Manya Silvas, MD;  Location: [facility];  Service: Endoscopy;  Laterality: N/A;  . ESOPHAGOGASTRODUODENOSCOPY (EGD) WITH PROPOFOL N/A 05/25/2019   Procedure: ESOPHAGOGASTRODUODENOSCOPY (EGD) WITH BIOPSY;  Surgeon: Lucilla Lame, MD;  Location: [place];  Service: Endoscopy;  Laterality: N/A;  Priority 3  . JOINT REPLACEMENT Left    knee  . PTOSIS REPAIR Bilateral 06/01/2014   Procedure: PTOSIS REPAIR;  Surgeon: Karle Starch, MD;  Location: [place];  Service: Ophthalmology;  Laterality: Bilateral;  . SKIN CANCER EXCISION    . TOTAL KNEE ARTHROPLASTY Left 04/30/2012   Procedure: TOTAL KNEE ARTHROPLASTY;  Surgeon: Gearlean Alf, MD;  [facility];  Service: Orthopedics;  Laterality: Left;  . TUBAL LIGATION     Family History  Problem Relation Age of Onset  . Hypertension Mother   . Congestive Heart Failure Mother   . Stroke Father   . Stroke Brother   . Heart failure Brother   . Sarcoidosis Sister        died of kidney  failure  . Breast cancer Neg Hx   . Colon cancer Neg Hx    Social History   Socioeconomic History  . Marital status: Widowed    Spouse name: Not on file  . Number of children: 2  . Years of education: Not on file  . Highest education level: Not on file  Occupational History    Comment: Retired Statistician  Tobacco Use  . Smoking status: Never Smoker  . Smokeless tobacco: Never Used  Vaping Use  . Vaping Use: Never used  Substance and Sexual Activity  . Alcohol use: Yes    Alcohol/week: 1.0 standard drink    Types: 1 Glasses of wine per  week    Comment: SOCIALLY  . Drug use: No  . Sexual activity: Not Currently  Other Topics Concern  . 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 Difficulty of Paying Living Expenses: Not on file  Food Insecurity:   . Worried About Charity fundraiser in the Last Year: Not on file  . Ran Out of Food in the Last Year: Not on file  Transportation Needs:   . Lack of Transportation (Medical): Not on file  . Lack of Transportation (Non-Medical): Not on file  Physical Activity:   . Days of Exercise per Week: Not on file  . Minutes of Exercise per Session: Not on file  Stress:   . Feeling of Stress : Not on file  Social Connections:   . Frequency of Communication with Friends and Family: Not on file  . Frequency of Social Gatherings with Friends and Family: Not on file  . Attends Religious Services: Not on file  . Active Member of Clubs or Organizations: Not on file  . Attends Archivist Meetings: Not on file  . Marital Status: Not on file    Outpatient Encounter Medications as of 09/02/2019  Medication Sig  . albuterol (PROVENTIL HFA;VENTOLIN HFA) 108 (90 Base) MCG/ACT inhaler Inhale 2 puffs into the lungs every 6 (six) hours as needed for wheezing or shortness of breath.  . Cholecalciferol (VITAMIN D3 PO) Take by mouth daily.  Marland Kitchen ezetimibe-simvastatin (VYTORIN) 10-20 MG tablet TAKE ONE TABLET AT BEDTIME  . Multiple Vitamin (MULTIVITAMIN) capsule Take 1 capsule by mouth daily. AM  . omeprazole (PRILOSEC) 20 MG capsule Take 20 mg by mouth daily.  Marland Kitchen telmisartan (MICARDIS) 40 MG tablet Take 1 tablet (40 mg total) by mouth every morning.  . vitamin B-12  (CYANOCOBALAMIN) 1000 MCG tablet Take 1,000 mcg by mouth daily.  . Vitamin E 400 units TABS Take by mouth daily.   No facility-administered encounter medications on file as of 09/02/2019.    Review of Systems  Constitutional: Negative for appetite change and unexpected weight change.  HENT: Negative for congestion and sinus pressure.   Eyes: Negative for pain and visual disturbance.  Respiratory: Negative for cough, chest tightness and shortness of breath.   Cardiovascular: Negative for chest pain, palpitations and leg swelling.  Gastrointestinal: Negative for abdominal pain, diarrhea, nausea and vomiting.  Genitourinary: Negative for difficulty urinating and dysuria.  Musculoskeletal: Negative for joint swelling  and myalgias.  Skin: Negative for color change and rash.  Neurological: Negative for dizziness, light-headedness and headaches.  Hematological: Negative for adenopathy. Does not bruise/bleed easily.  Psychiatric/Behavioral: Negative for agitation and dysphoric mood.       Objective:    Physical Exam Vitals reviewed.  Constitutional:      General: She is not in acute distress.    Appearance: Normal appearance. She is well-developed.  HENT:     Head: Normocephalic and atraumatic.     Right Ear: External ear normal.     Left Ear: External ear normal.  Eyes:     General: No scleral icterus.       Right eye: No discharge.        Left eye: No discharge.     Conjunctiva/sclera: Conjunctivae normal.  Neck:     Thyroid: No thyromegaly.  Cardiovascular:     Rate and Rhythm: Normal rate and regular rhythm.  Pulmonary:     Effort: No tachypnea, accessory muscle usage or respiratory distress.     Breath sounds: Normal breath sounds. No decreased breath sounds or wheezing.  Chest:     Breasts:        Right: No inverted nipple, mass, nipple discharge or tenderness (no axillary adenopathy).        Left: No inverted nipple, mass, nipple discharge or tenderness (no axilarry  adenopathy).  Abdominal:     General: Bowel sounds are normal.     Palpations: Abdomen is soft.     Tenderness: There is no abdominal tenderness.  Musculoskeletal:        General: No swelling or tenderness.     Cervical back: Neck supple. No tenderness.  Lymphadenopathy:     Cervical: No cervical adenopathy.  Skin:    Findings: No erythema or rash.  Neurological:     Mental Status: She is alert and oriented to person, place, and time.  Psychiatric:        Mood and Affect: Mood normal.        Behavior: Behavior normal.     BP 128/78   Pulse 78   Temp 97.6 F (36.4 C)   Resp 16   Ht '5\' 3"'  (1.6 m)   Wt 181 lb (82.1 kg)   LMP 01/29/1993   SpO2 99%   BMI 32.06 kg/m  Wt Readings from Last 3 Encounters:  09/02/19 181 lb (82.1 kg)  05/25/19 182 lb (82.6 kg)  04/08/19 187 lb 9.6 oz (85.1 kg)     Lab Results  Component Value Date   WBC 6.0 04/20/2019   HGB 14.5 04/20/2019   HCT 43.1 04/20/2019   PLT 261.0 04/20/2019   GLUCOSE 111 (H) 09/02/2019   CHOL 142 09/02/2019   TRIG 101.0 09/02/2019   HDL 42.60 09/02/2019   LDLDIRECT 182.4 02/05/2012   LDLCALC 80 09/02/2019   ALT 21 09/02/2019   AST 22 09/02/2019   NA 138 09/02/2019   K 4.1 09/02/2019   CL 105 09/02/2019   CREATININE 0.87 09/02/2019   BUN 20 09/02/2019   CO2 27 09/02/2019   TSH 3.32 09/02/2019   INR 0.99 04/22/2012   HGBA1C 6.0 09/02/2019   MICROALBUR <0.7 09/02/2019    MM 3D SCREEN BREAST BILATERAL  Result Date: 08/19/2019 CLINICAL DATA:  Screening. EXAM: DIGITAL SCREENING BILATERAL MAMMOGRAM WITH TOMO AND CAD COMPARISON:  Previous exam(s). ACR Breast Density Category a: The breast tissue is almost entirely fatty. FINDINGS: There are no findings suspicious for malignancy. Images were processed  with CAD. IMPRESSION: No mammographic evidence of malignancy. A result letter of this screening mammogram will be mailed directly to the patient. RECOMMENDATION: Screening mammogram in one year. (Code:SM-B-01Y)  BI-RADS CATEGORY  1: Negative. Electronically Signed   By: Everlean Alstrom M.D.   On: 08/19/2019 12:12       Assessment & Plan:   Problem List Items Addressed This Visit    Hypertension    Blood pressure doing well.  Continue micardis.  Follow pressures.  Follow metabolic panel.       Relevant Orders   TSH (Completed)   Hyperglycemia    Low carb diet and exercise.  Follow met b and a1c.       Hypercholesterolemia    Low cholesterol diet and exercise.  Follow lipid panel and liver function tests.  Continues on vytorin.       Relevant Orders   Lipid panel (Completed)   Hepatic function panel (Completed)   History of HPV infection    Evaluated by gyn.  Last evaluated 03/2018.  Discussed f/u with gyn.  Discussed repeat pap. She declines.        Health care maintenance    Physical today 09/02/19.  PAP - declined.   Mammogram 08/19/19 - Birads I.  Colonoscopy 10/2013 - diverticulosis and internal hemorrhoids.  Recommended f/u colonoscopy in 10 years.        Controlled diabetes mellitus type 2 with complications (HCC) - Primary    Low carb diet and exercise.  Follow met b and a1c.        Relevant Orders   Hemoglobin A1c (Completed)   Basic metabolic panel (Completed)   Microalbumin / creatinine urine ratio (Completed)   Barrett's esophagus    Per note, dates back to 2006.  Just saw GI.  S/p EGD as outlined.  Has adjusted her diet.  No significant acid reflux currently.  Follow.        Aortic atherosclerosis ([place])    Continue vytorin.           Einar Pheasant, MD

## 2019-09-02 NOTE — Assessment & Plan Note (Addendum)
Physical today 09/02/19.  PAP - declined.   Mammogram 08/19/19 - Birads I.  Colonoscopy 10/2013 - diverticulosis and internal hemorrhoids.  Recommended f/u colonoscopy in 10 years.

## 2019-09-19 ENCOUNTER — Encounter: Payer: Self-pay | Admitting: Internal Medicine

## 2019-09-19 NOTE — Assessment & Plan Note (Signed)
Low cholesterol diet and exercise.  Follow lipid panel and liver function tests.  Continues on vytorin.

## 2019-09-19 NOTE — Assessment & Plan Note (Signed)
Low carb diet and exercise.  Follow met b and a1c.  

## 2019-09-19 NOTE — Assessment & Plan Note (Signed)
Evaluated by gyn.  Last evaluated 03/2018.  Discussed f/u with gyn.  Discussed repeat pap. She declines.

## 2019-09-19 NOTE — Assessment & Plan Note (Signed)
Continue vytorin.   

## 2019-09-19 NOTE — Assessment & Plan Note (Addendum)
Per note, dates back to 2006.  Just saw GI.  S/p EGD as outlined.  Has adjusted her diet.  No significant acid reflux currently.  Follow.

## 2019-09-19 NOTE — Assessment & Plan Note (Signed)
Blood pressure doing well.  Continue micardis.  Follow pressures.  Follow metabolic panel.

## 2019-09-19 NOTE — Assessment & Plan Note (Signed)
Low carb diet and exercise.  Follow met b and a1c.   

## 2019-09-22 DIAGNOSIS — H02054 Trichiasis without entropian left upper eyelid: Secondary | ICD-10-CM | POA: Diagnosis not present

## 2019-09-22 LAB — HM DIABETES EYE EXAM

## 2019-10-12 ENCOUNTER — Other Ambulatory Visit: Payer: Self-pay | Admitting: Internal Medicine

## 2019-10-26 ENCOUNTER — Ambulatory Visit: Payer: Medicare PPO | Attending: Internal Medicine

## 2019-10-26 DIAGNOSIS — Z23 Encounter for immunization: Secondary | ICD-10-CM

## 2019-10-26 NOTE — Progress Notes (Signed)
° °  Covid-19 Vaccination Clinic  Name:  NEDA WILLENBRING    MRN: [id number] DOB: Jun 13, 1945  10/26/2019  Ms. Makar was observed post Covid-19 immunization for 15 minutes without incident. She was provided with Vaccine Information Sheet and instruction to access the V-Safe system.   Ms. Labriola was instructed to call 911 with any severe reactions post vaccine:  Difficulty breathing   Swelling of face and throat   A fast heartbeat   A bad rash all over body   Dizziness and weakness

## 2019-11-20 DIAGNOSIS — D225 Melanocytic nevi of trunk: Secondary | ICD-10-CM | POA: Diagnosis not present

## 2019-11-20 DIAGNOSIS — Z85828 Personal history of other malignant neoplasm of skin: Secondary | ICD-10-CM | POA: Diagnosis not present

## 2019-11-20 DIAGNOSIS — Z08 Encounter for follow-up examination after completed treatment for malignant neoplasm: Secondary | ICD-10-CM | POA: Diagnosis not present

## 2019-11-20 DIAGNOSIS — C44722 Squamous cell carcinoma of skin of right lower limb, including hip: Secondary | ICD-10-CM | POA: Diagnosis not present

## 2019-11-20 DIAGNOSIS — L570 Actinic keratosis: Secondary | ICD-10-CM | POA: Diagnosis not present

## 2019-11-20 DIAGNOSIS — B351 Tinea unguium: Secondary | ICD-10-CM | POA: Diagnosis not present

## 2019-11-20 DIAGNOSIS — D485 Neoplasm of uncertain behavior of skin: Secondary | ICD-10-CM | POA: Diagnosis not present

## 2019-11-20 DIAGNOSIS — L821 Other seborrheic keratosis: Secondary | ICD-10-CM | POA: Diagnosis not present

## 2019-12-04 DIAGNOSIS — L570 Actinic keratosis: Secondary | ICD-10-CM | POA: Diagnosis not present

## 2019-12-04 DIAGNOSIS — C44712 Basal cell carcinoma of skin of right lower limb, including hip: Secondary | ICD-10-CM | POA: Diagnosis not present

## 2020-01-12 ENCOUNTER — Other Ambulatory Visit: Payer: Self-pay | Admitting: Internal Medicine

## 2020-01-14 ENCOUNTER — Encounter: Payer: Self-pay | Admitting: Internal Medicine

## 2020-01-14 ENCOUNTER — Ambulatory Visit: Payer: Medicare PPO | Admitting: Internal Medicine

## 2020-01-14 ENCOUNTER — Other Ambulatory Visit: Payer: Self-pay

## 2020-01-14 VITALS — BP 112/70 | HR 76 | Temp 97.7°F | Resp 16 | Ht 63.0 in | Wt 184.0 lb

## 2020-01-14 DIAGNOSIS — Z8619 Personal history of other infectious and parasitic diseases: Secondary | ICD-10-CM

## 2020-01-14 DIAGNOSIS — I10 Essential (primary) hypertension: Secondary | ICD-10-CM

## 2020-01-14 DIAGNOSIS — E7800 Pure hypercholesterolemia, unspecified: Secondary | ICD-10-CM

## 2020-01-14 DIAGNOSIS — K219 Gastro-esophageal reflux disease without esophagitis: Secondary | ICD-10-CM | POA: Diagnosis not present

## 2020-01-14 DIAGNOSIS — I700 Atherosclerosis of aorta: Secondary | ICD-10-CM | POA: Diagnosis not present

## 2020-01-14 DIAGNOSIS — E118 Type 2 diabetes mellitus with unspecified complications: Secondary | ICD-10-CM | POA: Diagnosis not present

## 2020-01-14 DIAGNOSIS — K449 Diaphragmatic hernia without obstruction or gangrene: Secondary | ICD-10-CM

## 2020-01-14 DIAGNOSIS — K2270 Barrett's esophagus without dysplasia: Secondary | ICD-10-CM | POA: Diagnosis not present

## 2020-01-14 LAB — BASIC METABOLIC PANEL
BUN: 20 mg/dL (ref 6–23)
CO2: 27 mEq/L (ref 19–32)
Calcium: 10 mg/dL (ref 8.4–10.5)
Chloride: 104 mEq/L (ref 96–112)
Creatinine, Ser: 1.09 mg/dL (ref 0.40–1.20)
GFR: 49.26 mL/min — ABNORMAL LOW (ref >60.00)
Glucose, Bld: 117 mg/dL — ABNORMAL HIGH (ref 70–99)
Potassium: 4.4 mEq/L (ref 3.5–5.1)
Sodium: 138 mEq/L (ref 135–145)

## 2020-01-14 LAB — LIPID PANEL
Cholesterol: 157 mg/dL (ref 0–200)
HDL: 44.9 mg/dL (ref >39.00)
LDL Cholesterol: 87 mg/dL (ref 0–99)
NonHDL: 112.52
Total CHOL/HDL Ratio: 4
Triglycerides: 129 mg/dL (ref 0.0–149.0)
VLDL: 25.8 mg/dL (ref 0.0–40.0)

## 2020-01-14 LAB — HEPATIC FUNCTION PANEL
ALT: 20 U/L (ref 0–35)
AST: 20 U/L (ref 0–37)
Albumin: 4.5 g/dL (ref 3.5–5.2)
Alkaline Phosphatase: 83 U/L (ref 39–117)
Bilirubin, Direct: 0.1 mg/dL (ref 0.0–0.3)
Total Bilirubin: 0.9 mg/dL (ref 0.2–1.2)
Total Protein: 6.9 g/dL (ref 6.0–8.3)

## 2020-01-14 LAB — HEMOGLOBIN A1C: Hgb A1c MFr Bld: 6 % (ref 4.6–6.5)

## 2020-01-14 LAB — HM DIABETES FOOT EXAM

## 2020-01-14 MED ORDER — PANTOPRAZOLE SODIUM 40 MG PO TBEC
40.0000 mg | DELAYED_RELEASE_TABLET | Freq: Every day | ORAL | 1 refills | Status: DC
Start: 2020-01-14 — End: 2020-04-18

## 2020-01-14 NOTE — Progress Notes (Signed)
Patient ID: Kaitlin Dean, female   DOB: July 13, 1945, 74 y.o.   MRN: [id number]   Subjective:    Patient ID: Kaitlin Dean, female    DOB: 10-Jul-1945, 74 y.o.   MRN: [id number]  HPI This visit occurred during the SARS-CoV-2 public health emergency.  Safety protocols were in place, including screening questions prior to the visit, additional usage of staff PPE, and extensive cleaning of exam room while observing appropriate contact time as indicated for disinfecting solutions.  Patient here for a scheduled follow up.  Here to follow up regarding her sugar and cholesterol.  She is doing well. Feels good.  Continues to watch her diet.  No chest pain or sob reported.  No abdominal pain or bowel change reported.  Taking protonix q day.  Saw Dr Allen Norris.  Is s/p EGD.  If she watches her diet, elevates the head of her bed and takes the protonix daily - symptoms controlled.  Had her eye exam 2 weeks ago.  Everything ok.    Past Medical History:  Diagnosis Date  . 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 Barrett's esophagus   . Chicken pox   . Complication of anesthesia    woke up 15 years ago and felt like could not breathe - no complications   . Diverticulosis of colon without hemorrhage 02/28/2014  . GERD (gastroesophageal reflux disease)   . History of fractured kneecap   . History of fractured pelvis   . History of hiatal hernia   . Hypercholesterolemia   . Hypertension   . Osteoarthritis   . Osteoporosis   . Pneumonia    in past  . 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 Shoulder fracture, right 11/07/2018  . Skin cancer    Past Surgical History:  Procedure Laterality Date  . BREAST EXCISIONAL BIOPSY Right 2000   benign  . BREAST SURGERY  2000   Biopsy  . CATARACT EXTRACTION W/PHACO Right 12/05/2016   Procedure: CATARACT EXTRACTION PHACO AND INTRAOCULAR LENS PLACEMENT ([place]) RIGHT SYMFONY LENS;  Surgeon: Leandrew Koyanagi, MD;  Location: [facility];  Service:  Ophthalmology;  Laterality: Right;  . CATARACT EXTRACTION W/PHACO Left 02/06/2017   Procedure: CATARACT EXTRACTION PHACO AND INTRAOCULAR LENS PLACEMENT ([place]) LEFT SYMFONY LENS;  Surgeon: Leandrew Koyanagi, MD;  Location: [place] [facility];  Service: Ophthalmology;  Laterality: Left;  . COLONOSCOPY    . ESOPHAGOGASTRODUODENOSCOPY    . ESOPHAGOGASTRODUODENOSCOPY (EGD) WITH PROPOFOL N/A 06/11/2016   Procedure: ESOPHAGOGASTRODUODENOSCOPY (EGD) WITH PROPOFOL;  Surgeon: Manya Silvas, MD;  Location: [facility];  Service: Endoscopy;  Laterality: N/A;  . ESOPHAGOGASTRODUODENOSCOPY (EGD) WITH PROPOFOL N/A 05/25/2019   Procedure: ESOPHAGOGASTRODUODENOSCOPY (EGD) WITH BIOPSY;  Surgeon: Lucilla Lame, MD;  Location: [facility];  Service: Endoscopy;  Laterality: N/A;  Priority 3  . JOINT REPLACEMENT Left    knee  . PTOSIS REPAIR Bilateral 06/01/2014   Procedure: PTOSIS REPAIR;  Surgeon: Karle Starch, MD;  Location: [facility];  Service: Ophthalmology;  Laterality: Bilateral;  . SKIN CANCER EXCISION    . TOTAL KNEE ARTHROPLASTY Left 04/30/2012   Procedure: TOTAL KNEE ARTHROPLASTY;  Surgeon: Gearlean Alf, MD;  Location: [facility];  Service: Orthopedics;  Laterality: Left;  . TUBAL LIGATION     Family History  Problem Relation Age of Onset  . Hypertension Mother   . Congestive Heart Failure Mother   . Stroke Father   . Stroke Brother   . Heart failure Brother   .  Sarcoidosis Sister        died of kidney failure  . Breast cancer Neg Hx   . Colon cancer Neg Hx    Social History   Socioeconomic History  . Marital status: Widowed    Spouse name: Not on file  . Number of children: 2  . Years of education: Not on file  . Highest education level: Not on file  Occupational History    Comment: Retired Statistician  Tobacco Use  . Smoking status: Never Smoker  . Smokeless tobacco: Never Used  Vaping Use  . Vaping Use: Never used  Substance and Sexual Activity  .  Alcohol use: Yes    Alcohol/week: 1.0 standard drink    Types: 1 Glasses of wine per week    Comment: SOCIALLY  . Drug use: No  . Sexual activity: Not Currently  Other Topics Concern  . 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 albuterol (PROVENTIL HFA;VENTOLIN HFA) 108 (90 Base) MCG/ACT inhaler Inhale 2 puffs into the lungs every 6 (six) hours as needed for wheezing or shortness of breath.  . Cholecalciferol (VITAMIN D3 PO) Take by mouth daily.  Marland Kitchen ezetimibe-simvastatin (VYTORIN) 10-20 MG tablet TAKE ONE TABLET AT BEDTIME  . Multiple Vitamin (MULTIVITAMIN) capsule Take 1 capsule by mouth daily. AM  . pantoprazole (PROTONIX) 40 MG tablet Take 1 tablet (40 mg total) by mouth daily.  Marland Kitchen telmisartan (MICARDIS) 40 MG tablet TAKE 1 TABLET BY MOUTH EVERY MORNING.  . vitamin B-12 (CYANOCOBALAMIN) 1000 MCG tablet Take 1,000 mcg by mouth daily.  . Vitamin E 400 units TABS Take by mouth daily.  . [DISCONTINUED] omeprazole (PRILOSEC) 20 MG capsule Take 20 mg by mouth daily.  . [DISCONTINUED] pantoprazole (PROTONIX) 40 MG tablet Take 40 mg by mouth daily.   No facility-administered encounter medications on file as of 01/14/2020.    Review of Systems  Constitutional: Negative for appetite change and unexpected weight change.  HENT: Negative for congestion and sinus pressure.   Respiratory: Negative for cough, chest tightness and shortness of  breath.   Cardiovascular: Negative for chest pain, palpitations and leg swelling.  Gastrointestinal: Negative for abdominal pain, diarrhea, nausea and vomiting.  Genitourinary: Negative for difficulty urinating and dysuria.  Musculoskeletal: Negative for joint swelling and myalgias.  Skin: Negative for color change and rash.  Neurological: Negative for dizziness, light-headedness and headaches.  Psychiatric/Behavioral: Negative for agitation and dysphoric mood.       Objective:    Physical Exam Vitals reviewed.  Constitutional:      General: She is not in acute distress.    Appearance: Normal appearance.  HENT:     Head: Normocephalic and atraumatic.     Right Ear: External ear normal.     Left Ear: External ear normal.     Mouth/Throat:     Mouth: Oropharynx is clear and  moist.  Eyes:     General: No scleral icterus.       Right eye: No discharge.        Left eye: No discharge.     Conjunctiva/sclera: Conjunctivae normal.  Neck:     Thyroid: No thyromegaly.  Cardiovascular:     Rate and Rhythm: Normal rate and regular rhythm.  Pulmonary:     Effort: No respiratory distress.     Breath sounds: Normal breath sounds. No wheezing.  Abdominal:     General: Bowel sounds are normal.     Palpations: Abdomen is soft.     Tenderness: There is no abdominal tenderness.  Musculoskeletal:        General: No swelling, tenderness or edema.     Cervical back: Neck supple. No tenderness.  Lymphadenopathy:     Cervical: No cervical adenopathy.  Skin:    Findings: No erythema or rash.  Neurological:     Mental Status: She is alert.  Psychiatric:        Mood and Affect: Mood normal.        Behavior: Behavior normal.     BP 112/70   Pulse 76   Temp 97.7 F (36.5 C) (Oral)   Resp 16   Ht 5' 3" (1.6 m)   Wt 184 lb (83.5 kg)   LMP 01/29/1993   SpO2 99%   BMI 32.59 kg/m  Wt Readings from Last 3 Encounters:  01/14/20 184 lb (83.5 kg)  09/02/19 181 lb (82.1 kg)  05/25/19 182  lb (82.6 kg)     Lab Results  Component Value Date   WBC 6.0 04/20/2019   HGB 14.5 04/20/2019   HCT 43.1 04/20/2019   PLT 261.0 04/20/2019   GLUCOSE 117 (H) 01/14/2020   CHOL 157 01/14/2020   TRIG 129.0 01/14/2020   HDL 44.90 01/14/2020   LDLDIRECT 182.4 02/05/2012   LDLCALC 87 01/14/2020   ALT 20 01/14/2020   AST 20 01/14/2020   NA 138 01/14/2020   K 4.4 01/14/2020   CL 104 01/14/2020   CREATININE 1.09 01/14/2020   BUN 20 01/14/2020   CO2 27 01/14/2020   TSH 3.32 09/02/2019   INR 0.99 04/22/2012   HGBA1C 6.0 01/14/2020   MICROALBUR <0.7 09/02/2019    MM 3D SCREEN BREAST BILATERAL  Result Date: 08/19/2019 CLINICAL DATA:  Screening. EXAM: DIGITAL SCREENING BILATERAL MAMMOGRAM WITH TOMO AND CAD COMPARISON:  Previous exam(s). ACR Breast Density Category a: The breast tissue is almost entirely fatty. FINDINGS: There are no findings suspicious for malignancy. Images were processed with CAD. IMPRESSION: No mammographic evidence of malignancy. A result letter of this screening mammogram will be mailed directly to the patient. RECOMMENDATION: Screening mammogram in one year. (Code:SM-B-01Y) BI-RADS CATEGORY  1: Negative. Electronically Signed   By: Everlean Alstrom M.D.   On: 08/19/2019 12:12       Assessment & Plan:   Problem List Items Addressed This Visit    Hypertension - Primary    Continue micardis.  Blood pressure doing well.  Follow pressures.  Follow metabolic panel.       Relevant Orders   Basic metabolic panel (Completed)   Hypercholesterolemia    Continue vytorin.  Low cholesterol diet and exercise.  Follow lipid panel and liver function tests.        Relevant Orders   Hepatic function panel (Completed)   Lipid panel (Completed)   Hiatal hernia with GERD    On protonix.  Elevating head of bed.  Symptoms controlled.  Follow       Relevant Medications   pantoprazole (PROTONIX) 40 MG tablet   History of HPV infection    Evaluated by gyn.  Last evaluated  03/2018.  Has previously declined f/u with gyn/f/u pap.        Barrett's esophagus    Followed by GI.  Continue protonix.        Aortic atherosclerosis ([place])    Continue vytorin.  Follow.       Controlled diabetes mellitus type 2 with complications (HCC)    Low carb diet and exercise.  Follow met b and a1c.   Lab Results  Component Value Date   HGBA1C 6.0 01/14/2020        Relevant Orders   Hemoglobin A1c (Completed)       Einar Pheasant, MD

## 2020-01-15 ENCOUNTER — Other Ambulatory Visit: Payer: Self-pay | Admitting: Internal Medicine

## 2020-01-15 DIAGNOSIS — R944 Abnormal results of kidney function studies: Secondary | ICD-10-CM

## 2020-01-15 NOTE — Progress Notes (Signed)
Order placed for f/u met b 

## 2020-01-25 ENCOUNTER — Encounter: Payer: Self-pay | Admitting: Internal Medicine

## 2020-01-25 NOTE — Assessment & Plan Note (Signed)
Followed by GI.  Continue protonix.

## 2020-01-25 NOTE — Assessment & Plan Note (Signed)
Continue micardis.  Blood pressure doing well.  Follow pressures.  Follow metabolic panel.   

## 2020-01-25 NOTE — Assessment & Plan Note (Signed)
Low carb diet and exercise.  Follow met b and a1c.   Lab Results  Component Value Date   HGBA1C 6.0 01/14/2020

## 2020-01-25 NOTE — Assessment & Plan Note (Signed)
Evaluated by gyn.  Last evaluated 03/2018.  Has previously declined f/u with gyn/f/u pap.

## 2020-01-25 NOTE — Assessment & Plan Note (Signed)
Continue vytorin.  Follow.

## 2020-01-25 NOTE — Assessment & Plan Note (Signed)
Continue vytorin.  Low cholesterol diet and exercise. Follow lipid panel and liver function tests.   

## 2020-01-25 NOTE — Assessment & Plan Note (Signed)
On protonix.  Elevating head of bed.  Symptoms controlled.  Follow

## 2020-01-27 ENCOUNTER — Other Ambulatory Visit: Payer: Self-pay

## 2020-01-27 ENCOUNTER — Other Ambulatory Visit (INDEPENDENT_AMBULATORY_CARE_PROVIDER_SITE_OTHER): Payer: Medicare PPO

## 2020-01-27 DIAGNOSIS — R944 Abnormal results of kidney function studies: Secondary | ICD-10-CM | POA: Diagnosis not present

## 2020-01-27 LAB — BASIC METABOLIC PANEL
BUN: 18 mg/dL (ref 6–23)
CO2: 26 mEq/L (ref 19–32)
Calcium: 9.6 mg/dL (ref 8.4–10.5)
Chloride: 106 mEq/L (ref 96–112)
Creatinine, Ser: 0.86 mg/dL (ref 0.40–1.20)
GFR: 65.44 mL/min (ref >60.00)
Glucose, Bld: 68 mg/dL — ABNORMAL LOW (ref 70–99)
Potassium: 3.9 mEq/L (ref 3.5–5.1)
Sodium: 141 mEq/L (ref 135–145)

## 2020-01-28 ENCOUNTER — Encounter: Payer: Self-pay | Admitting: *Deleted

## 2020-02-25 ENCOUNTER — Ambulatory Visit (INDEPENDENT_AMBULATORY_CARE_PROVIDER_SITE_OTHER): Payer: Medicare PPO

## 2020-02-25 VITALS — Ht 63.0 in | Wt 184.0 lb

## 2020-02-25 DIAGNOSIS — Z0000 Encounter for general adult medical examination without abnormal findings: Secondary | ICD-10-CM

## 2020-02-25 NOTE — Progress Notes (Signed)
Subjective:   Kaitlin Dean is a 75 y.o. female who presents for Medicare Annual (Subsequent) preventive examination.  Review of Systems    No ROS.  Medicare Wellness Virtual Visit.    Cardiac Risk Factors include: advanced age (>78men, >52 women);hypertension     Objective:    Today's Vitals   02/25/20 0833  Weight: 184 lb (83.5 kg)  Height: 5\' 3"  (1.6 m)   Body mass index is 32.59 kg/m.  Advanced Directives 02/25/2020 05/25/2019 03/17/2019 02/24/2019 12/16/2018 02/21/2018 02/19/2017  Does Patient Have a Medical Advance Directive? Yes Yes Yes Yes Yes Yes Yes  Type of Estate agent of [place];Living will Healthcare Power of [place];Living will Healthcare Power of [place];Living will Healthcare Power of [place];Living will Living will;Healthcare Power of [employer] Power of [place];Living will Healthcare Power of [place];Living will  Does patient want to make changes to medical advance directive? No - Patient declined - - No - Patient declined No - Patient declined No - Patient declined -  Copy of Healthcare Power of Attorney in Chart? No - copy requested Yes - validated most recent copy scanned in chart (See row information) No - copy requested No - copy requested - No - copy requested No - copy requested  Pre-existing out of facility DNR order (yellow form or pink MOST form) - - - - - - -    Current Medications (verified) Outpatient Encounter Medications as of 02/25/2020  Medication Sig  . albuterol (PROVENTIL HFA;VENTOLIN HFA) 108 (90 Base) MCG/ACT inhaler Inhale 2 puffs into the lungs every 6 (six) hours as needed for wheezing or shortness of breath.  . Cholecalciferol (VITAMIN D3 PO) Take by mouth daily.  Marland Kitchen ezetimibe-simvastatin (VYTORIN) 10-20 MG tablet TAKE ONE TABLET AT BEDTIME  . Multiple Vitamin (MULTIVITAMIN) capsule Take 1 capsule by mouth daily. AM  . pantoprazole (PROTONIX) 40 MG tablet Take 1 tablet (40 mg total) by mouth daily.  Marland Kitchen  telmisartan (MICARDIS) 40 MG tablet TAKE 1 TABLET BY MOUTH EVERY MORNING.  . vitamin B-12 (CYANOCOBALAMIN) 1000 MCG tablet Take 1,000 mcg by mouth daily.  . Vitamin E 400 units TABS Take by mouth daily.   No facility-administered encounter medications on file as of 02/25/2020.    Allergies (verified) Flagyl [metronidazole]   History: Past Medical History:  Diagnosis Date  . 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 Barrett's esophagus   . Chicken pox   . Complication of anesthesia    woke up 15 years ago and felt like could not breathe - no complications   . Diverticulosis of colon without hemorrhage 02/28/2014  . GERD (gastroesophageal reflux disease)   . History of fractured kneecap   . History of fractured pelvis   . History of hiatal hernia   . Hypercholesterolemia   . Hypertension   . Osteoarthritis   . Osteoporosis   . Pneumonia    in past  . 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 Shoulder fracture, right 11/07/2018  . Skin cancer    Past Surgical History:  Procedure Laterality Date  . BREAST EXCISIONAL BIOPSY Right 2000   benign  . BREAST SURGERY  2000   Biopsy  . CATARACT EXTRACTION W/PHACO Right 12/05/2016   Procedure: CATARACT EXTRACTION PHACO AND INTRAOCULAR LENS PLACEMENT (IOC) RIGHT SYMFONY LENS;  Surgeon: Lockie Mola, MD;  Location: [facility];  Service: Ophthalmology;  Laterality: Right;  . CATARACT EXTRACTION W/PHACO Left 02/06/2017   Procedure: CATARACT EXTRACTION PHACO AND  INTRAOCULAR LENS PLACEMENT (IOC) LEFT SYMFONY LENS;  Surgeon: Lockie Mola, MD;  Location: [facility];  Service: Ophthalmology;  Laterality: Left;  . COLONOSCOPY    . ESOPHAGOGASTRODUODENOSCOPY    . ESOPHAGOGASTRODUODENOSCOPY (EGD) WITH PROPOFOL N/A 06/11/2016   Procedure: ESOPHAGOGASTRODUODENOSCOPY (EGD) WITH PROPOFOL;  Surgeon: Scot Jun, MD;  Location: [facility];  Service: Endoscopy;  Laterality: N/A;  . ESOPHAGOGASTRODUODENOSCOPY  (EGD) WITH PROPOFOL N/A 05/25/2019   Procedure: ESOPHAGOGASTRODUODENOSCOPY (EGD) WITH BIOPSY;  Surgeon: Midge Minium, MD;  Location: [facility];  Service: Endoscopy;  Laterality: N/A;  Priority 3  . JOINT REPLACEMENT Left    knee  . PTOSIS REPAIR Bilateral 06/01/2014   Procedure: PTOSIS REPAIR;  Surgeon: Imagene Riches, MD;  Location: [facility];  Service: Ophthalmology;  Laterality: Bilateral;  . SKIN CANCER EXCISION    . TOTAL KNEE ARTHROPLASTY Left 04/30/2012   Procedure: TOTAL KNEE ARTHROPLASTY;  Surgeon: Loanne Drilling, MD;  Location: [facility];  Service: Orthopedics;  Laterality: Left;  . TUBAL LIGATION     Family History  Problem Relation Age of Onset  . Hypertension Mother   . Congestive Heart Failure Mother   . Stroke Father   . Stroke Brother   . Heart failure Brother   . Sarcoidosis Sister        died of kidney failure  . Breast cancer Neg Hx   . Colon cancer Neg Hx    Social History   Socioeconomic History  . Marital status: Widowed    Spouse name: Not on file  . Number of children: 2  . Years of education: Not on file  . Highest education level: Not on file  Occupational History    Comment: Retired Insurance account manager  Tobacco Use  . Smoking status: Never Smoker  . Smokeless tobacco: Never Used  Vaping Use  . Vaping Use: Never used  Substance and Sexual Activity  . Alcohol use: Yes    Alcohol/week: 1.0 standard drink    Types: 1 Glasses of wine per week    Comment: SOCIALLY  . Drug use: No  . Sexual activity: Not Currently  Other Topics Concern  . Not on file  Social History Narrative   Patient is a widow and she is a Buyer, retail of [employer]   Retired Microbiologist in the [employer]   1 son and 1 daughter, 1 son is a Forensic scientist working for [employer] and the daughter lives in [place]   Rare alcohol non-smoker   Social Determinants of Corporate investment banker Strain: Low Risk   . Difficulty of Paying  Living Expenses: Not hard at all  Food Insecurity: No Food Insecurity  . Worried About Programme researcher, broadcasting/film/video in the Last Year: Never true  . Ran Out of Food in the Last Year: Never true  Transportation Needs: No Transportation Needs  . Lack of Transportation (Medical): No  . Lack of Transportation (Non-Medical): No  Physical Activity: Sufficiently Active  . Days of Exercise per Week: 5 days  . Minutes of Exercise per Session: 30 min  Stress: No Stress Concern Present  . Feeling of Stress : Not at all  Social Connections: Unknown  . Frequency of Communication with Friends and Family: More than three times a week  . Frequency of Social Gatherings with Friends and Family: More than three times a week  . Attends Religious Services: More than 4 times per year  . Active Member of Clubs or Organizations:  Yes  . Attends Club or Organization Meetings: More than 4 times per year  . Marital Status: Not on file    Tobacco Counseling Counseling given: Not Answered   Clinical Intake:  Pre-visit preparation completed: Yes        Diabetes: No  How often do you need to have someone help you when you read instructions, pamphlets, or other written materials from your doctor or pharmacy?: 1 - Never   Interpreter Needed?: No      Activities of Daily Living In your present state of health, do you have any difficulty performing the following activities: 02/25/2020  Hearing? N  Vision? N  Difficulty concentrating or making decisions? N  Walking or climbing stairs? N  Dressing or bathing? N  Doing errands, shopping? N  Preparing Food and eating ? N  Using the Toilet? N  In the past six months, have you accidently leaked urine? N  Do you have problems with loss of bowel control? N  Managing your Medications? N  Managing your Finances? N  Housekeeping or managing your Housekeeping? N  Some recent data might be hidden    Patient Care Team: Dale [place], MD as PCP - General (Internal  Medicine)  Indicate any recent Medical Services you may have received from other than [facility] providers in the past year (date may be approximate).     Assessment:   This is a routine wellness examination for [place].  I connected with Camil today by telephone and verified that I am speaking with the correct person using two identifiers. Location patient: home Location provider: work Persons participating in the virtual visit: patient, Engineer, civil (consulting).    I discussed the limitations, risks, security and privacy concerns of performing an evaluation and management service by telephone and the availability of in person appointments. The patient expressed understanding and verbally consented to this telephonic visit.    Interactive audio and video telecommunications were attempted between this provider and patient, however failed, due to patient having technical difficulties OR patient did not have access to video capability.  We continued and completed visit with audio only.  Some vital signs may be absent or patient reported.   Hearing/Vision screen  Hearing Screening   125Hz  250Hz  500Hz  1000Hz  2000Hz  3000Hz  4000Hz  6000Hz  8000Hz   Right ear:           Left ear:           Comments: Patient is able to hear conversational tones without difficulty.  No issues reported.  Vision Screening Comments: Followed by [facility]  Cataract extraction, bilateral  Regular follow up with the ophthalmologist  Dietary issues and exercise activities discussed: Current Exercise Habits: Home exercise routine, Type of exercise: walking, Intensity: Mild  Healthy diet Good water intake  Goals    . Weight (lb) < 190 lb (86.2 kg)     Lose 10lbs      Depression Screen PHQ 2/9 Scores 02/25/2020 02/24/2019 02/21/2018 01/24/2018 02/19/2017 08/17/2016 02/20/2016  PHQ - 2 Score 0 0 0 1 0 0 0  PHQ- 9 Score - - - 7 0 1 -    Fall Risk Fall Risk  02/25/2020 02/24/2019 02/21/2018 02/19/2017 02/20/2016  Falls in the past  year? 0 1 1 No No  Number falls in past yr: 0 1 0 - -  Injury with Fall? 0 1 - - -  Comment - Broke arm, fell due to [employer]. No falls since last reported in 10/2018 - - -  Follow up Falls evaluation completed Falls evaluation completed;Falls prevention discussed - - -    FALL RISK PREVENTION PERTAINING TO THE HOME: Handrails in use when climbing stairs? Yes Home free of loose throw rugs in walkways, pet beds, electrical cords, etc? Yes  Adequate lighting in your home to reduce risk of falls? Yes   ASSISTIVE DEVICES UTILIZED TO PREVENT FALLS: Life alert? No  Use of a cane, walker or w/c? No  Grab bars in the bathroom? Yes  Shower chair or bench in shower? Yes  Elevated toilet seat or a handicapped toilet? Yes   TIMED UP AND GO: Was the test performed? No . Virtual visit.   Cognitive Function: Patient is alert and oriented x3.  Denies difficulty focusing, making decisions, memory loss.  Enjoys brain challenging activities.  MMSE/6CIT deferred. Normal by direct communication/observation.   MMSE - Mini Mental State Exam 02/19/2017 02/14/2015  Orientation to time 5 5  Orientation to Place 5 5  Registration 3 3  Attention/ Calculation 5 5  Recall 3 3  Language- name 2 objects 2 2  Language- repeat 1 1  Language- follow 3 step command 3 3  Language- read & follow direction 1 1  Write a sentence 1 1  Copy design 1 1  Total score 30 30     6CIT Screen 02/24/2019 02/21/2018 02/20/2016  What Year? 0 points 0 points 0 points  What month? 0 points 0 points 0 points  What time? 0 points 0 points 0 points  Count back from 20 - 0 points 0 points  Months in reverse - 0 points 0 points  Repeat phrase - 0 points 0 points  Total Score - 0 0    Immunizations Immunization History  Administered Date(s) Administered  . Influenza Split 10/30/2011, 11/14/2012  . Influenza, High Dose Seasonal PF 11/01/2015, 11/09/2016, 11/18/2017, 12/03/2018  . Influenza,inj,Quad PF,6+ Mos  11/12/2013, 01/18/2015  . Influenza-Unspecified 11/23/2011, 12/15/2019  . PFIZER(Purple Top)SARS-COV-2 Vaccination 02/04/2019, 02/25/2019, 10/26/2019  . Pneumococcal Conjugate-13 08/27/2013  . Pneumococcal Polysaccharide-23 08/21/2017  . Zoster 03/29/2010  . Zoster Recombinat (Shingrix) 10/01/2017, 01/03/2018   TDAP status: Due, Education has been provided regarding the importance of this vaccine. Advised may receive this vaccine at local pharmacy or Health Dept. Aware to provide a copy of the vaccination record if obtained from local pharmacy or Health Dept. Verbalized acceptance and understanding. Deferred.   Health Maintenance Health Maintenance  Topic Date Due  . TETANUS/TDAP  02/24/2021 (Originally 01/30/2020)  . COVID-19 Vaccine (4 - Booster for Pfizer series) 04/24/2020  . HEMOGLOBIN A1C  07/14/2020  . MAMMOGRAM  08/18/2020  . OPHTHALMOLOGY EXAM  12/13/2020  . FOOT EXAM  01/13/2021  . INFLUENZA VACCINE  Completed  . DEXA SCAN  Completed  . Hepatitis C Screening  Completed    Colorectal cancer screening: No longer required.   Mammogram status: Completed 08/19/19. Repeat every year.MM 3D SCREEN BREAST BILATERAL.  Bone Density status: Completed 08/07/18. Results reflect: Bone density results: OSTEOPOROSIS. Repeat every 2 years.   Lung Cancer Screening: (Low Dose CT Chest recommended if Age 60-80 years, 30 pack-year currently smoking OR have quit w/in 15years.) does not qualify.   Hepatitis C Screening: Completed 02/14/15.  Vision Screening: Recommended annual ophthalmology exams for early detection of glaucoma and other disorders of the eye. Is the patient up to date with their annual eye exam?  Yes  Who is the provider or what is the name of the office in which the patient attends annual  eye exams? [facility]  Dental Screening: Recommended annual dental exams for proper oral hygiene. Visits every 6 months.   Community Resource Referral / Chronic Care Management: CRR  required this visit?  No   CCM required this visit?  No      Plan:   Keep all routine maintenance appointments.   Follow up 05/16/20 @ 10:00  I have personally reviewed and noted the following in the patient's chart:   . Medical and social history . Use of alcohol, tobacco or illicit drugs  . Current medications and supplements . Functional ability and status . Nutritional status . Physical activity . Advanced directives . List of other physicians . Hospitalizations, surgeries, and ER visits in previous 12 months . Vitals . Screenings to include cognitive, depression, and falls . Referrals and appointments  In addition, I have reviewed and discussed with patient certain preventive protocols, quality metrics, and best practice recommendations. A written personalized care plan for preventive services as well as general preventive health recommendations were provided to patient via mychart.     Ashok Pall, LPN   [id number]/61/0960

## 2020-02-25 NOTE — Patient Instructions (Addendum)
Kaitlin Dean , Thank you for taking time to come for your Medicare Wellness Visit. I appreciate your ongoing commitment to your health goals. Please review the following plan we discussed and let me know if I can assist you in the future.   These are the goals we discussed: Goals    . Weight (lb) < 190 lb (86.2 kg)     Lose 10lbs       This is a list of the screening recommended for you and due dates:  Health Maintenance  Topic Date Due  . Tetanus Vaccine  02/24/2021*  . COVID-19 Vaccine (4 - Booster for Pfizer series) 04/24/2020  . Hemoglobin A1C  07/14/2020  . Mammogram  08/18/2020  . Eye exam for diabetics  12/13/2020  . Complete foot exam   01/13/2021  . Flu Shot  Completed  . DEXA scan (bone density measurement)  Completed  .  Hepatitis C: One time screening is recommended by Center for Disease Control  (CDC) for  adults born from [birth year] through 1967.   Completed  *Topic was postponed. The date shown is not the original due date.    Immunizations Immunization History  Administered Date(s) Administered  . Influenza Split 10/30/2011, 11/14/2012  . Influenza, High Dose Seasonal PF 11/01/2015, 11/09/2016, 11/18/2017, 12/03/2018  . Influenza,inj,Quad PF,6+ Mos 11/12/2013, 01/18/2015  . Influenza-Unspecified 11/23/2011, 12/15/2019  . PFIZER(Purple Top)SARS-COV-2 Vaccination 02/04/2019, 02/25/2019, 10/26/2019  . Pneumococcal Conjugate-13 08/27/2013  . Pneumococcal Polysaccharide-23 08/21/2017  . Zoster 03/29/2010  . Zoster Recombinat (Shingrix) 10/01/2017, 01/03/2018   Follow up 05/16/20 @ 10:00  Advanced directives: End of life planning; Advance aging; Advanced directives discussed.  Copy of current HCPOA/Living Will requested.    Conditions/risks identified: none new  Follow up in one year for your annual wellness visit    Preventive Care 65 Years and Older, Female Preventive care refers to lifestyle choices and visits with your health care provider that can promote health  and wellness. What does preventive care include?  A yearly physical exam. This is also called an annual well check.  Dental exams once or twice a year.  Routine eye exams. Ask your health care provider how often you should have your eyes checked.  Personal lifestyle choices, including:  Daily care of your teeth and gums.  Regular physical activity.  Eating a healthy diet.  Avoiding tobacco and drug use.  Limiting alcohol use.  Practicing safe sex.  Taking low-dose aspirin every day.  Taking vitamin and mineral supplements as recommended by your health care provider. What happens during an annual well check? The services and screenings done by your health care provider during your annual well check will depend on your age, overall health, lifestyle risk factors, and family history of disease. Counseling  Your health care provider may ask you questions about your:  Alcohol use.  Tobacco use.  Drug use.  Emotional well-being.  Home and relationship well-being.  Sexual activity.  Eating habits.  History of falls.  Memory and ability to understand (cognition).  Work and work Statistician.  Reproductive health. Screening  You may have the following tests or measurements:  Height, weight, and BMI.  Blood pressure.  Lipid and cholesterol levels. These may be checked every 5 years, or more frequently if you are over 13 years old.  Skin check.  Lung cancer screening. You may have this screening every year starting at age 38 if you have a 30-pack-year history of smoking and currently smoke or have quit within the  past 15 years.  Fecal occult blood test (FOBT) of the stool. You may have this test every year starting at age 56.  Flexible sigmoidoscopy or colonoscopy. You may have a sigmoidoscopy every 5 years or a colonoscopy every 10 years starting at age 79.  Hepatitis C blood test.  Hepatitis B blood test.  Sexually transmitted disease (STD)  testing.  Diabetes screening. This is done by checking your blood sugar (glucose) after you have not eaten for a while (fasting). You may have this done every 1-3 years.  Bone density scan. This is done to screen for osteoporosis. You may have this done starting at age 52.  Mammogram. This may be done every 1-2 years. Talk to your health care provider about how often you should have regular mammograms. Talk with your health care provider about your test results, treatment options, and if necessary, the need for more tests. Vaccines  Your health care provider may recommend certain vaccines, such as:  Influenza vaccine. This is recommended every year.  Tetanus, diphtheria, and acellular pertussis (Tdap, Td) vaccine. You may need a Td booster every 10 years.  Zoster vaccine. You may need this after age 68.  Pneumococcal 13-valent conjugate (PCV13) vaccine. One dose is recommended after age 69.  Pneumococcal polysaccharide (PPSV23) vaccine. One dose is recommended after age 60. Talk to your health care provider about which screenings and vaccines you need and how often you need them. This information is not intended to replace advice given to you by your health care provider. Make sure you discuss any questions you have with your health care provider. Document Released: 02/11/2015 Document Revised: 10/05/2015 Document Reviewed: 11/16/2014 Elsevier Interactive Patient Education  2017 [place] Prevention in the Home Falls can cause injuries. They can happen to people of all ages. There are many things you can do to make your home safe and to help prevent falls. What can I do on the outside of my home?  Regularly fix the edges of walkways and driveways and fix any cracks.  Remove anything that might make you trip as you walk through a door, such as a raised step or threshold.  Trim any bushes or trees on the path to your home.  Use bright outdoor lighting.  Clear any walking  paths of anything that might make someone trip, such as rocks or tools.  Regularly check to see if handrails are loose or broken. Make sure that both sides of any steps have handrails.  Any raised decks and porches should have guardrails on the edges.  Have any leaves, snow, or ice cleared regularly.  Use sand or salt on walking paths during winter.  Clean up any spills in your garage right away. This includes oil or grease spills. What can I do in the bathroom?  Use night lights.  Install grab bars by the toilet and in the tub and shower. Do not use towel bars as grab bars.  Use non-skid mats or decals in the tub or shower.  If you need to sit down in the shower, use a plastic, non-slip stool.  Keep the floor dry. Clean up any water that spills on the floor as soon as it happens.  Remove soap buildup in the tub or shower regularly.  Attach bath mats securely with double-sided non-slip rug tape.  Do not have throw rugs and other things on the floor that can make you trip. What can I do in the bedroom?  Use night lights.  Make sure that you have a light by your bed that is easy to reach.  Do not use any sheets or blankets that are too big for your bed. They should not hang down onto the floor.  Have a firm chair that has side arms. You can use this for support while you get dressed.  Do not have throw rugs and other things on the floor that can make you trip. What can I do in the kitchen?  Clean up any spills right away.  Avoid walking on wet floors.  Keep items that you use a lot in easy-to-reach places.  If you need to reach something above you, use a strong step stool that has a grab bar.  Keep electrical cords out of the way.  Do not use floor polish or wax that makes floors slippery. If you must use wax, use non-skid floor wax.  Do not have throw rugs and other things on the floor that can make you trip. What can I do with my stairs?  Do not leave any items  on the stairs.  Make sure that there are handrails on both sides of the stairs and use them. Fix handrails that are broken or loose. Make sure that handrails are as long as the stairways.  Check any carpeting to make sure that it is firmly attached to the stairs. Fix any carpet that is loose or worn.  Avoid having throw rugs at the top or bottom of the stairs. If you do have throw rugs, attach them to the floor with carpet tape.  Make sure that you have a light switch at the top of the stairs and the bottom of the stairs. If you do not have them, ask someone to add them for you. What else can I do to help prevent falls?  Wear shoes that:  Do not have high heels.  Have rubber bottoms.  Are comfortable and fit you well.  Are closed at the toe. Do not wear sandals.  If you use a stepladder:  Make sure that it is fully opened. Do not climb a closed stepladder.  Make sure that both sides of the stepladder are locked into place.  Ask someone to hold it for you, if possible.  Clearly mark and make sure that you can see:  Any grab bars or handrails.  First and last steps.  Where the edge of each step is.  Use tools that help you move around (mobility aids) if they are needed. These include:  Canes.  Walkers.  Scooters.  Crutches.  Turn on the lights when you go into a dark area. Replace any light bulbs as soon as they burn out.  Set up your furniture so you have a clear path. Avoid moving your furniture around.  If any of your floors are uneven, fix them.  If there are any pets around you, be aware of where they are.  Review your medicines with your doctor. Some medicines can make you feel dizzy. This can increase your chance of falling. Ask your doctor what other things that you can do to help prevent falls. This information is not intended to replace advice given to you by your health care provider. Make sure you discuss any questions you have with your health care  provider. Document Released: 11/11/2008 Document Revised: 06/23/2015 Document Reviewed: 02/19/2014 Elsevier Interactive Patient Education  2017 [employer].

## 2020-04-18 ENCOUNTER — Other Ambulatory Visit: Payer: Self-pay | Admitting: Internal Medicine

## 2020-05-02 ENCOUNTER — Encounter: Payer: Self-pay | Admitting: Internal Medicine

## 2020-05-16 ENCOUNTER — Ambulatory Visit: Payer: Medicare PPO | Admitting: Internal Medicine

## 2020-06-01 ENCOUNTER — Telehealth: Payer: Self-pay

## 2020-06-01 NOTE — Telephone Encounter (Signed)
Pt would like to have fasting labs in the morning. There are no orders in. She has an appt tomorrow afternoon but can not fast that long. Please advise

## 2020-06-01 NOTE — Telephone Encounter (Signed)
Patient is going to eat a light breakfast around 8 and then fast until her appt.

## 2020-06-02 ENCOUNTER — Encounter: Payer: Self-pay | Admitting: Internal Medicine

## 2020-06-02 ENCOUNTER — Other Ambulatory Visit: Payer: Self-pay

## 2020-06-02 ENCOUNTER — Ambulatory Visit: Payer: Medicare PPO | Admitting: Internal Medicine

## 2020-06-02 VITALS — BP 112/70 | HR 74 | Temp 97.9°F | Resp 16 | Ht 63.0 in | Wt 186.2 lb

## 2020-06-02 DIAGNOSIS — K449 Diaphragmatic hernia without obstruction or gangrene: Secondary | ICD-10-CM | POA: Diagnosis not present

## 2020-06-02 DIAGNOSIS — K219 Gastro-esophageal reflux disease without esophagitis: Secondary | ICD-10-CM | POA: Diagnosis not present

## 2020-06-02 DIAGNOSIS — E1165 Type 2 diabetes mellitus with hyperglycemia: Secondary | ICD-10-CM

## 2020-06-02 DIAGNOSIS — E118 Type 2 diabetes mellitus with unspecified complications: Secondary | ICD-10-CM | POA: Diagnosis not present

## 2020-06-02 DIAGNOSIS — I10 Essential (primary) hypertension: Secondary | ICD-10-CM

## 2020-06-02 DIAGNOSIS — K2270 Barrett's esophagus without dysplasia: Secondary | ICD-10-CM

## 2020-06-02 DIAGNOSIS — E7800 Pure hypercholesterolemia, unspecified: Secondary | ICD-10-CM

## 2020-06-02 DIAGNOSIS — Z8619 Personal history of other infectious and parasitic diseases: Secondary | ICD-10-CM

## 2020-06-02 DIAGNOSIS — I700 Atherosclerosis of aorta: Secondary | ICD-10-CM

## 2020-06-02 NOTE — Progress Notes (Signed)
Patient ID: DELMY HOLDREN, female   DOB: 1945-11-09, 75 y.o.   MRN: [id number]   Subjective:    Patient ID: CHANIN FRUMKIN, female    DOB: [birth year], 75 y.o.   MRN: [id number]  HPI This visit occurred during the SARS-CoV-2 public health emergency.  Safety protocols were in place, including screening questions prior to the visit, additional usage of staff PPE, and extensive cleaning of exam room while observing appropriate contact time as indicated for disinfecting solutions.  Patient here for a scheduled follow up. Here to follow up regarding her blood pressure, cholesterol and blood sugar. She reports she is doing well.  No chest pain.  Blood pressure doing well.  No cough or congestion.  No sob reported.  No abdominal pain.  Bowels moving.     Past Medical History:  Diagnosis Date  . 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 Barrett's esophagus   . Chicken pox   . Complication of anesthesia    woke up 15 years ago and felt like could not breathe - no complications   . Diverticulosis of colon without hemorrhage 02/28/2014  . GERD (gastroesophageal reflux disease)   . History of fractured kneecap   . History of fractured pelvis   . History of hiatal hernia   . Hypercholesterolemia   . Hypertension   . Osteoarthritis   . Osteoporosis   . Pneumonia    in past  . 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 Shoulder fracture, right 11/07/2018  . Skin cancer    Past Surgical History:  Procedure Laterality Date  . BREAST EXCISIONAL BIOPSY Right 2000   benign  . BREAST SURGERY  2000   Biopsy  . CATARACT EXTRACTION W/PHACO Right 12/05/2016   Procedure: CATARACT EXTRACTION PHACO AND INTRAOCULAR LENS PLACEMENT ([place]) RIGHT SYMFONY LENS;  Surgeon: Leandrew Koyanagi, MD;  Location: [facility];  Service: Ophthalmology;  Laterality: Right;  . CATARACT EXTRACTION W/PHACO Left 02/06/2017   Procedure: CATARACT EXTRACTION PHACO AND INTRAOCULAR LENS PLACEMENT ([place]) LEFT SYMFONY  LENS;  Surgeon: Leandrew Koyanagi, MD;  Location: [facility];  Service: Ophthalmology;  Laterality: Left;  . COLONOSCOPY    . ESOPHAGOGASTRODUODENOSCOPY    . ESOPHAGOGASTRODUODENOSCOPY (EGD) WITH PROPOFOL N/A 06/11/2016   Procedure: ESOPHAGOGASTRODUODENOSCOPY (EGD) WITH PROPOFOL;  Surgeon: Manya Silvas, MD;  Location[facility] ENDOSCOPY;  Service: Endoscopy;  Laterality: N/A;  . ESOPHAGOGASTRODUODENOSCOPY (EGD) WITH PROPOFOL N/A 05/25/2019   Procedure: ESOPHAGOGASTRODUODENOSCOPY (EGD) WITH BIOPSY;  Surgeon: Lucilla Lame, MD;  Location: [place];  Service: Endoscopy;  Laterality: N/A;  Priority 3  . JOINT REPLACEMENT Left    knee  . PTOSIS REPAIR Bilateral 06/01/2014   Procedure: PTOSIS REPAIR;  Surgeon: Karle Starch, MD;  Location: [place];  Service: Ophthalmology;  Laterality: Bilateral;  . SKIN CANCER EXCISION    . TOTAL KNEE ARTHROPLASTY Left 04/30/2012   Procedure: TOTAL KNEE ARTHROPLASTY;  Surgeon: Gearlean Alf, MD;  Location: [facility];  Service: Orthopedics;  Laterality: Left;  . TUBAL LIGATION     Family History  Problem Relation Age of Onset  . Hypertension Mother   . Congestive Heart Failure Mother   . Stroke Father   . Stroke Brother   . Heart failure Brother   . Sarcoidosis Sister        died of kidney failure  . Breast cancer Neg Hx   . Colon cancer Neg Hx    Social History   Socioeconomic History  .  Marital status: Widowed    Spouse name: Not on file  . Number of children: 2  . Years of education: Not on file  . Highest education level: Not on file  Occupational History    Comment: Retired Statistician  Tobacco Use  . Smoking status: Never Smoker  . Smokeless tobacco: Never Used  Vaping Use  . Vaping Use: Never used  Substance and Sexual Activity  . Alcohol use: Yes    Alcohol/week: 1.0 standard drink    Types: 1 Glasses of wine per week    Comment: SOCIALLY  . Drug use: No  . Sexual activity: Not Currently  Other  Topics Concern  . 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 Difficulty of Paying Living Expenses: Not hard at all  Food Insecurity: No Food Insecurity  . Worried About Charity fundraiser in the Last Year: Never true  . Ran Out of Food in the Last Year: Never true  Transportation Needs: No Transportation Needs  . Lack of Transportation (Medical): No  . Lack of Transportation (Non-Medical): No  Physical Activity: Sufficiently Active  . Days of Exercise per Week: 5 days  . Minutes of Exercise per Session: 30 min  Stress: No Stress Concern Present  . Feeling of Stress : Not at all  Social Connections: Unknown  . Frequency of Communication with Friends and Family: More than three times a week  . Frequency of Social Gatherings with Friends and Family: More than three times a week  . Attends Religious Services: More than 4 times per year  . Active Member of Clubs or Organizations: Yes  . Attends Archivist Meetings: More than 4 times per year  . Marital Status: Not on file    Outpatient Encounter Medications as of 06/02/2020  Medication Sig  . albuterol (PROVENTIL HFA;VENTOLIN HFA) 108 (90 Base) MCG/ACT inhaler Inhale 2 puffs into the lungs every 6 (six) hours as needed for wheezing or shortness of breath.  . Cholecalciferol (VITAMIN D3 PO) Take by mouth daily.  Marland Kitchen ezetimibe-simvastatin (VYTORIN) 10-20 MG tablet TAKE ONE TABLET AT BEDTIME  . Multiple Vitamin (MULTIVITAMIN) capsule Take 1 capsule by mouth daily. AM  . pantoprazole (PROTONIX) 40 MG tablet TAKE ONE TABLET BY MOUTH DAILY  . telmisartan (MICARDIS) 40 MG tablet TAKE 1 TABLET BY MOUTH EVERY  MORNING.  . vitamin B-12 (CYANOCOBALAMIN) 1000 MCG tablet Take 1,000 mcg by mouth daily.  . Vitamin E 400 units TABS Take by mouth daily.   No facility-administered encounter medications on file as of 06/02/2020.    Review of Systems  Constitutional: Negative for appetite change and unexpected weight change.  HENT: Negative for congestion and sinus pressure.   Respiratory: Negative for cough, chest tightness and shortness of breath.   Cardiovascular: Negative for chest pain, palpitations and leg swelling.  Gastrointestinal: Negative for abdominal pain, diarrhea, nausea and vomiting.  Genitourinary: Negative for difficulty urinating and dysuria.  Musculoskeletal: Negative for joint swelling and myalgias.  Skin: Negative for color change and rash.  Neurological: Negative for dizziness, light-headedness and headaches.  Psychiatric/Behavioral: Negative for agitation  and dysphoric mood.       Objective:    Physical Exam Vitals reviewed.  Constitutional:      General: She is not in acute distress.    Appearance: Normal appearance.  HENT:     Head: Normocephalic and atraumatic.     Right Ear: External ear normal.     Left Ear: External ear normal.  Eyes:     General: No scleral icterus.       Right eye: No discharge.        Left eye: No discharge.     Conjunctiva/sclera: Conjunctivae normal.  Neck:     Thyroid: No thyromegaly.  Cardiovascular:     Rate and Rhythm: Normal rate and regular rhythm.  Pulmonary:     Effort: No respiratory distress.     Breath sounds: Normal breath sounds. No wheezing.  Abdominal:     General: Bowel sounds are normal.     Palpations: Abdomen is soft.     Tenderness: There is no abdominal tenderness.  Musculoskeletal:        General: No swelling or tenderness.     Cervical back: Neck supple. No tenderness.  Lymphadenopathy:     Cervical: No cervical adenopathy.  Skin:    Findings: No erythema or rash.  Neurological:     Mental Status: She is  alert.  Psychiatric:        Mood and Affect: Mood normal.        Behavior: Behavior normal.     BP 112/70   Pulse 74   Temp 97.9 F (36.6 C) (Oral)   Resp 16   Ht '5\' 3"'  (1.6 m)   Wt 186 lb 3.2 oz (84.5 kg)   LMP 01/29/1993   SpO2 98%   BMI 32.98 kg/m  Wt Readings from Last 3 Encounters:  06/02/20 186 lb 3.2 oz (84.5 kg)  02/25/20 184 lb (83.5 kg)  01/14/20 184 lb (83.5 kg)     Lab Results  Component Value Date   WBC 4.9 06/02/2020   HGB 14.3 06/02/2020   HCT 41.9 06/02/2020   PLT 234.0 06/02/2020   GLUCOSE 102 (H) 06/02/2020   CHOL 179 06/02/2020   TRIG 91.0 06/02/2020   HDL 44.10 06/02/2020   LDLDIRECT 182.4 02/05/2012   LDLCALC 116 (H) 06/02/2020   ALT 24 06/02/2020   AST 23 06/02/2020   NA 140 06/02/2020   K 3.9 06/02/2020   CL 106 06/02/2020   CREATININE 1.07 06/02/2020   BUN 23 06/02/2020   CO2 23 06/02/2020   TSH 3.32 09/02/2019   INR 0.99 04/22/2012   HGBA1C 6.1 06/02/2020   MICROALBUR <0.7 09/02/2019    MM 3D SCREEN BREAST BILATERAL  Result Date: 08/19/2019 CLINICAL DATA:  Screening. EXAM: DIGITAL SCREENING BILATERAL MAMMOGRAM WITH TOMO AND CAD COMPARISON:  Previous exam(s). ACR Breast Density Category a: The breast tissue is almost entirely fatty. FINDINGS: There are no findings suspicious for malignancy. Images were processed with CAD. IMPRESSION: No mammographic evidence of malignancy. A result letter of this screening mammogram will be mailed directly to the patient. RECOMMENDATION: Screening mammogram in one year. (Code:SM-B-01Y) BI-RADS CATEGORY  1: Negative. Electronically Signed   By: Everlean Alstrom M.D.   On: 08/19/2019 12:12       Assessment & Plan:   Problem List Items Addressed This Visit    Aortic atherosclerosis ([place])    Continue vytorin.        Barrett's esophagus    EGD 04/2019.  Followed  by GI - Dr Allen Norris.       Hiatal hernia with GERD    No upper symptoms reported.  Continue protonix.       History of HPV infection     Evaluated by gyn.  Last evaluated 03/2018.  Discussed with her today. Previously declined f/u with gyn.  Agreeable to f/u pap here.  Will plan for next visit.       Hypercholesterolemia    Continue vytorin.  Low cholesterol diet and exercise. Follow lipid panel and liver function tests.        Relevant Orders   Hepatic function panel (Completed)   Lipid panel (Completed)   Hypertension - Primary    Continue micardis.  Blood pressure doing well.  Follow pressures.  Follow metabolic panel.        Relevant Orders   CBC with Differential/Platelet (Completed)   Basic metabolic panel (Completed)   Type 2 diabetes mellitus with hyperglycemia (HCC)    Low carb diet and exercise - given increased sugar.  Check met b and a1c.           Einar Pheasant, MD

## 2020-06-03 DIAGNOSIS — Z85828 Personal history of other malignant neoplasm of skin: Secondary | ICD-10-CM | POA: Diagnosis not present

## 2020-06-03 DIAGNOSIS — L858 Other specified epidermal thickening: Secondary | ICD-10-CM | POA: Diagnosis not present

## 2020-06-03 DIAGNOSIS — L570 Actinic keratosis: Secondary | ICD-10-CM | POA: Diagnosis not present

## 2020-06-03 DIAGNOSIS — L821 Other seborrheic keratosis: Secondary | ICD-10-CM | POA: Diagnosis not present

## 2020-06-03 DIAGNOSIS — D485 Neoplasm of uncertain behavior of skin: Secondary | ICD-10-CM | POA: Diagnosis not present

## 2020-06-03 DIAGNOSIS — D2272 Melanocytic nevi of left lower limb, including hip: Secondary | ICD-10-CM | POA: Diagnosis not present

## 2020-06-03 DIAGNOSIS — D225 Melanocytic nevi of trunk: Secondary | ICD-10-CM | POA: Diagnosis not present

## 2020-06-03 DIAGNOSIS — D2261 Melanocytic nevi of right upper limb, including shoulder: Secondary | ICD-10-CM | POA: Diagnosis not present

## 2020-06-03 LAB — HEPATIC FUNCTION PANEL
ALT: 24 U/L (ref 0–35)
AST: 23 U/L (ref 0–37)
Albumin: 4.7 g/dL (ref 3.5–5.2)
Alkaline Phosphatase: 80 U/L (ref 39–117)
Bilirubin, Direct: 0.2 mg/dL (ref 0.0–0.3)
Total Bilirubin: 1.1 mg/dL (ref 0.2–1.2)
Total Protein: 6.9 g/dL (ref 6.0–8.3)

## 2020-06-03 LAB — CBC WITH DIFFERENTIAL/PLATELET
Basophils Absolute: 0 10*3/uL (ref 0.0–0.1)
Basophils Relative: 0.4 % (ref 0.0–3.0)
Eosinophils Absolute: 0.1 10*3/uL (ref 0.0–0.7)
Eosinophils Relative: 2.5 % (ref 0.0–5.0)
HCT: 41.9 % (ref 36.0–46.0)
Hemoglobin: 14.3 g/dL (ref 12.0–15.0)
Lymphocytes Relative: 32.1 % (ref 12.0–46.0)
Lymphs Abs: 1.6 10*3/uL (ref 0.7–4.0)
MCHC: 34.2 g/dL (ref 30.0–36.0)
MCV: 90.4 fl (ref 78.0–100.0)
Monocytes Absolute: 0.3 10*3/uL (ref 0.1–1.0)
Monocytes Relative: 6.7 % (ref 3.0–12.0)
Neutro Abs: 2.8 10*3/uL (ref 1.4–7.7)
Neutrophils Relative %: 58.3 % (ref 43.0–77.0)
Platelets: 234 10*3/uL (ref 150.0–400.0)
RBC: 4.63 Mil/uL (ref 3.87–5.11)
RDW: 13.4 % (ref 11.5–15.5)
WBC: 4.9 10*3/uL (ref 4.0–10.5)

## 2020-06-03 LAB — LIPID PANEL
Cholesterol: 179 mg/dL (ref 0–200)
HDL: 44.1 mg/dL (ref >39.00)
LDL Cholesterol: 116 mg/dL — ABNORMAL HIGH (ref 0–99)
NonHDL: 134.55
Total CHOL/HDL Ratio: 4
Triglycerides: 91 mg/dL (ref 0.0–149.0)
VLDL: 18.2 mg/dL (ref 0.0–40.0)

## 2020-06-03 LAB — BASIC METABOLIC PANEL
BUN: 23 mg/dL (ref 6–23)
CO2: 23 mEq/L (ref 19–32)
Calcium: 9.7 mg/dL (ref 8.4–10.5)
Chloride: 106 mEq/L (ref 96–112)
Creatinine, Ser: 1.07 mg/dL (ref 0.40–1.20)
GFR: 50.23 mL/min — ABNORMAL LOW (ref >60.00)
Glucose, Bld: 102 mg/dL — ABNORMAL HIGH (ref 70–99)
Potassium: 3.9 mEq/L (ref 3.5–5.1)
Sodium: 140 mEq/L (ref 135–145)

## 2020-06-03 LAB — HEMOGLOBIN A1C: Hgb A1c MFr Bld: 6.1 % (ref 4.6–6.5)

## 2020-06-07 ENCOUNTER — Other Ambulatory Visit: Payer: Self-pay | Admitting: Internal Medicine

## 2020-06-07 DIAGNOSIS — R944 Abnormal results of kidney function studies: Secondary | ICD-10-CM

## 2020-06-07 NOTE — Progress Notes (Signed)
Order placed for f/u met b.  

## 2020-06-12 ENCOUNTER — Encounter: Payer: Self-pay | Admitting: Internal Medicine

## 2020-06-12 NOTE — Assessment & Plan Note (Signed)
Low carb diet and exercise - given increased sugar.  Check met b and a1c.

## 2020-06-12 NOTE — Assessment & Plan Note (Signed)
Continue micardis.  Blood pressure doing well.  Follow pressures.  Follow metabolic panel.   

## 2020-06-12 NOTE — Assessment & Plan Note (Signed)
No upper symptoms reported.  Continue protonix.  

## 2020-06-12 NOTE — Assessment & Plan Note (Signed)
Continue vytorin.  Low cholesterol diet and exercise. Follow lipid panel and liver function tests.   

## 2020-06-12 NOTE — Assessment & Plan Note (Signed)
Evaluated by gyn.  Last evaluated 03/2018.  Discussed with her today. Previously declined f/u with gyn.  Agreeable to f/u pap here.  Will plan for next visit.

## 2020-06-12 NOTE — Assessment & Plan Note (Signed)
EGD 04/2019.  Followed by GI - Dr Allen Norris.

## 2020-06-12 NOTE — Assessment & Plan Note (Signed)
Continue vytorin.   

## 2020-06-13 ENCOUNTER — Telehealth: Payer: Self-pay | Admitting: Internal Medicine

## 2020-06-15 ENCOUNTER — Other Ambulatory Visit: Payer: Self-pay

## 2020-06-15 MED ORDER — EZETIMIBE-SIMVASTATIN 10-40 MG PO TABS: 1.0000 | ORAL_TABLET | Freq: Every day | ORAL | 1 refills | Status: DC

## 2020-07-11 ENCOUNTER — Other Ambulatory Visit (INDEPENDENT_AMBULATORY_CARE_PROVIDER_SITE_OTHER): Payer: Medicare PPO

## 2020-07-11 ENCOUNTER — Other Ambulatory Visit: Payer: Self-pay

## 2020-07-11 DIAGNOSIS — R944 Abnormal results of kidney function studies: Secondary | ICD-10-CM | POA: Diagnosis not present

## 2020-07-11 LAB — BASIC METABOLIC PANEL
BUN: 20 mg/dL (ref 6–23)
CO2: 26 mEq/L (ref 19–32)
Calcium: 9.2 mg/dL (ref 8.4–10.5)
Chloride: 105 mEq/L (ref 96–112)
Creatinine, Ser: 0.99 mg/dL (ref 0.40–1.20)
GFR: 55.1 mL/min — ABNORMAL LOW (ref >60.00)
Glucose, Bld: 97 mg/dL (ref 70–99)
Potassium: 4.2 mEq/L (ref 3.5–5.1)
Sodium: 140 mEq/L (ref 135–145)

## 2020-07-12 ENCOUNTER — Other Ambulatory Visit: Payer: Self-pay | Admitting: Internal Medicine

## 2020-09-21 DIAGNOSIS — H26491 Other secondary cataract, right eye: Secondary | ICD-10-CM | POA: Diagnosis not present

## 2020-09-21 DIAGNOSIS — Z0100 Encounter for examination of eyes and vision without abnormal findings: Secondary | ICD-10-CM | POA: Diagnosis not present

## 2020-09-29 ENCOUNTER — Other Ambulatory Visit: Payer: Self-pay | Admitting: Internal Medicine

## 2020-10-02 ENCOUNTER — Other Ambulatory Visit: Payer: Self-pay | Admitting: Internal Medicine

## 2020-10-04 DIAGNOSIS — H26491 Other secondary cataract, right eye: Secondary | ICD-10-CM | POA: Diagnosis not present

## 2020-10-05 ENCOUNTER — Other Ambulatory Visit (HOSPITAL_COMMUNITY)
Admission: RE | Admit: 2020-10-05 | Discharge: 2020-10-05 | Disposition: A | Discharge status: Home / Self Care | Payer: Medicare PPO | Source: Ambulatory Visit | Attending: Internal Medicine | Admitting: Internal Medicine

## 2020-10-05 ENCOUNTER — Other Ambulatory Visit: Payer: Self-pay

## 2020-10-05 ENCOUNTER — Encounter: Payer: Self-pay | Admitting: Internal Medicine

## 2020-10-05 ENCOUNTER — Ambulatory Visit (INDEPENDENT_AMBULATORY_CARE_PROVIDER_SITE_OTHER): Payer: Medicare PPO | Admitting: Internal Medicine

## 2020-10-05 VITALS — BP 114/70 | HR 65 | Temp 97.8°F | Resp 16 | Ht 63.0 in | Wt 185.6 lb

## 2020-10-05 DIAGNOSIS — E1165 Type 2 diabetes mellitus with hyperglycemia: Secondary | ICD-10-CM | POA: Diagnosis not present

## 2020-10-05 DIAGNOSIS — Z124 Encounter for screening for malignant neoplasm of cervix: Secondary | ICD-10-CM | POA: Insufficient documentation

## 2020-10-05 DIAGNOSIS — Z0000 Encounter for general adult medical examination without abnormal findings: Secondary | ICD-10-CM

## 2020-10-05 DIAGNOSIS — I10 Essential (primary) hypertension: Secondary | ICD-10-CM

## 2020-10-05 DIAGNOSIS — Z01419 Encounter for gynecological examination (general) (routine) without abnormal findings: Secondary | ICD-10-CM | POA: Diagnosis not present

## 2020-10-05 DIAGNOSIS — K2270 Barrett's esophagus without dysplasia: Secondary | ICD-10-CM | POA: Diagnosis not present

## 2020-10-05 DIAGNOSIS — E7800 Pure hypercholesterolemia, unspecified: Secondary | ICD-10-CM

## 2020-10-05 DIAGNOSIS — I700 Atherosclerosis of aorta: Secondary | ICD-10-CM | POA: Diagnosis not present

## 2020-10-05 DIAGNOSIS — E118 Type 2 diabetes mellitus with unspecified complications: Secondary | ICD-10-CM

## 2020-10-05 DIAGNOSIS — Z8619 Personal history of other infectious and parasitic diseases: Secondary | ICD-10-CM

## 2020-10-05 DIAGNOSIS — Z1231 Encounter for screening mammogram for malignant neoplasm of breast: Secondary | ICD-10-CM | POA: Diagnosis not present

## 2020-10-05 DIAGNOSIS — Z1151 Encounter for screening for human papillomavirus (HPV): Secondary | ICD-10-CM | POA: Insufficient documentation

## 2020-10-05 LAB — HEPATIC FUNCTION PANEL
ALT: 31 U/L (ref 0–35)
AST: 26 U/L (ref 0–37)
Albumin: 4.4 g/dL (ref 3.5–5.2)
Alkaline Phosphatase: 64 U/L (ref 39–117)
Bilirubin, Direct: 0.2 mg/dL (ref 0.0–0.3)
Total Bilirubin: 1.1 mg/dL (ref 0.2–1.2)
Total Protein: 6.6 g/dL (ref 6.0–8.3)

## 2020-10-05 LAB — LIPID PANEL
Cholesterol: 133 mg/dL (ref 0–200)
HDL: 41.8 mg/dL (ref >39.00)
LDL Cholesterol: 67 mg/dL (ref 0–99)
NonHDL: 91.15
Total CHOL/HDL Ratio: 3
Triglycerides: 121 mg/dL (ref 0.0–149.0)
VLDL: 24.2 mg/dL (ref 0.0–40.0)

## 2020-10-05 LAB — TSH: TSH: 4.05 u[IU]/mL (ref 0.35–5.50)

## 2020-10-05 LAB — HEMOGLOBIN A1C: Hgb A1c MFr Bld: 6.1 % (ref 4.6–6.5)

## 2020-10-05 NOTE — Assessment & Plan Note (Addendum)
Physical today 10/05/20. PAP 10/05/20.  Mammogram 08/19/19 [place].  She will schedule f/u mammogram.  Colonoscopy 10/2013 - diverticulosis and internal hemorrhoids.  Recommended f/u colonoscopy in 10 years, but on questioning her, she states was told since colonoscopy ok and given age - no further colonoscopy necessary.  Wants to hold on bone density.

## 2020-10-05 NOTE — Progress Notes (Signed)
Patient ID: Kaitlin Dean, female   DOB: 1945-10-29, 75 y.o.   MRN: [id number]   Subjective:    Patient ID: Kaitlin Dean, female    DOB: 1945-10-03, 75 y.o.   MRN: [id number]  This visit occurred during the SARS-CoV-2 public health emergency.  Safety protocols were in place, including screening questions prior to the visit, additional usage of staff PPE, and extensive cleaning of exam room while observing appropriate contact time as indicated for disinfecting solutions.   Patient here for her physical exam.   Chief Complaint  Patient presents with   Annual Exam   .   HPI Reports she is doing relatively well.  Back doing water aerobics.  No chest pain or sob with increased activity or exertion.  No acid reflux - if watches her diet.  Bowels are better off prilosec.  No abdominal pain.  Bowels moving.  Some arthritis in her fingers - intermittent flares.  Uses a topicl gel.  Helps.  Not a significant issue for her.  She will schedule mammogram.    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)    Shoulder fracture, right 11/07/2018   Skin cancer    Past Surgical History:  Procedure Laterality Date   BREAST EXCISIONAL BIOPSY Right 2000   benign   BREAST SURGERY  2000   Biopsy   CATARACT EXTRACTION W/PHACO Right 12/05/2016   Procedure: CATARACT EXTRACTION PHACO AND INTRAOCULAR LENS PLACEMENT ([place]) RIGHT SYMFONY LENS;  Surgeon: Leandrew Koyanagi, MD;  Location: [place];  Service: Ophthalmology;  Laterality: Right;   CATARACT EXTRACTION W/PHACO Left 02/06/2017   Procedure: CATARACT EXTRACTION PHACO AND INTRAOCULAR LENS PLACEMENT ([place]) LEFT SYMFONY LENS;  Surgeon: Leandrew Koyanagi, MD;  Location: [place];  Service: Ophthalmology;  Laterality: Left;   COLONOSCOPY     ESOPHAGOGASTRODUODENOSCOPY     ESOPHAGOGASTRODUODENOSCOPY (EGD) WITH PROPOFOL N/A 06/11/2016   Procedure: ESOPHAGOGASTRODUODENOSCOPY (EGD) WITH PROPOFOL;  Surgeon: Manya Silvas, MD;  Location: [facility] ENDOSCOPY;  Service: Endoscopy;  Laterality: N/A;   ESOPHAGOGASTRODUODENOSCOPY (EGD) WITH PROPOFOL N/A 05/25/2019   Procedure: ESOPHAGOGASTRODUODENOSCOPY (EGD) WITH BIOPSY;  Surgeon: Lucilla Lame, MD;  Location: [place];  Service: Endoscopy;  Laterality: N/A;  Priority 3   JOINT REPLACEMENT Left    knee   PTOSIS REPAIR Bilateral 06/01/2014   Procedure: PTOSIS REPAIR;  Surgeon: Karle Starch, MD;  Location: [place];  Service: Ophthalmology;  Laterality: Bilateral;   SKIN CANCER EXCISION     TOTAL KNEE ARTHROPLASTY Left 04/30/2012   Procedure: TOTAL KNEE ARTHROPLASTY;  Surgeon: Gearlean Alf, MD;  [facility]: [facility];  Service: Orthopedics;  Laterality: Left;   TUBAL LIGATION     Family History  Problem Relation Age of Onset   Hypertension Mother    Congestive Heart Failure Mother    Stroke Father    Stroke Brother  Heart failure Brother    Sarcoidosis Sister        died of kidney failure   Breast cancer Neg Hx    Colon cancer Neg Hx    Social History   Socioeconomic History   Marital status: Widowed    Spouse name: Not on file   Number of children: 2   Years of education: Not on file   Highest education level: Not on file  Occupational History    Comment: Retired Statistician  Tobacco Use   Smoking status: Never   Smokeless tobacco: Never  Vaping Use   Vaping Use: Never used  Substance and Sexual Activity   Alcohol use: Yes     Alcohol/week: 1.0 standard drink    Types: 1 Glasses of wine per week    Comment: SOCIALLY   Drug use: No   Sexual activity: Not Currently  Other Topics Concern   Not on file  Social History Narrative   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[employer]   Retired Diplomatic Services operational officer in the [employer]   1 son and 1 daughter, 1 son is a Social research officer, government working for [employer] and the daughter lives in [place]   Rare alcohol non-smoker   Social Determinants of Radio broadcast assistant Strain: Low Risk    Difficulty of Paying Living Expenses: Not hard at all  Food Insecurity: No Food Insecurity   Worried About Charity fundraiser in the Last Year: Never true   Arboriculturist in the Last Year: Never true  Transportation Needs: No Transportation Needs   Lack of Transportation (Medical): No   Lack of Transportation (Non-Medical): No  Physical Activity: Sufficiently Active   Days of Exercise per Week: 5 days   Minutes of Exercise per Session: 30 min  Stress: No Stress Concern Present   Feeling of Stress : Not at all  Social Connections: Unknown   Frequency of Communication with Friends and Family: More than three times a week   Frequency of Social Gatherings with Friends and Family: More than three times a week   Attends Religious Services: More than 4 times per year   Active Member of [employer] or Organizations: Yes   Attends Music therapist: More than 4 times per year   Marital Status: Not on file    Review of Systems  Constitutional:  Negative for appetite change and unexpected weight change.  HENT:  Negative for congestion, sinus pressure and sore throat.   Eyes:  Negative for pain and visual disturbance.  Respiratory:  Negative for cough, chest tightness and shortness of breath.   Cardiovascular:  Negative for chest pain and palpitations.  Gastrointestinal:  Negative for abdominal pain, diarrhea, nausea and vomiting.  Genitourinary:   Negative for difficulty urinating and dysuria.  Musculoskeletal:  Negative for joint swelling and myalgias.       Fingers stiff as outlined.   Skin:  Negative for color change and rash.  Neurological:  Negative for dizziness, light-headedness and headaches.  Hematological:  Negative for adenopathy. Does not bruise/bleed easily.  Psychiatric/Behavioral:  Negative for decreased concentration and dysphoric mood.       Objective:     BP 114/70   Pulse 65   Temp 97.8 F (36.6 C)   Resp 16   Ht '5\' 3"'  (1.6 m)   Wt 185 lb 9.6 oz (84.2 kg)   LMP 01/29/1993   SpO2 98%  BMI 32.88 kg/m  Wt Readings from Last 3 Encounters:  10/05/20 185 lb 9.6 oz (84.2 kg)  06/02/20 186 lb 3.2 oz (84.5 kg)  02/25/20 184 lb (83.5 kg)    Physical Exam Vitals reviewed.  Constitutional:      General: She is not in acute distress.    Appearance: Normal appearance. She is well-developed.  HENT:     Head: Normocephalic and atraumatic.     Right Ear: External ear normal.     Left Ear: External ear normal.  Eyes:     General: No scleral icterus.       Right eye: No discharge.        Left eye: No discharge.     Conjunctiva/sclera: Conjunctivae normal.  Neck:     Thyroid: No thyromegaly.  Cardiovascular:     Rate and Rhythm: Normal rate and regular rhythm.  Pulmonary:     Effort: No tachypnea, accessory muscle usage or respiratory distress.     Breath sounds: Normal breath sounds. No decreased breath sounds or wheezing.  Chest:  Breasts:    Right: No inverted nipple, mass, nipple discharge or tenderness (no axillary adenopathy).     Left: No inverted nipple, mass, nipple discharge or tenderness (no axilarry adenopathy).  Abdominal:     General: Bowel sounds are normal.     Palpations: Abdomen is soft.     Tenderness: There is no abdominal tenderness.  Genitourinary:    Comments: Normal external genitalia.  Vaginal vault without lesions.  Cervix identified.  Pap smear performed.  Could not  appreciate any adnexal masses or tenderness.   Musculoskeletal:        General: No swelling or tenderness.     Cervical back: Neck supple.  Lymphadenopathy:     Cervical: No cervical adenopathy.  Skin:    Findings: No erythema or rash.  Neurological:     Mental Status: She is alert and oriented to person, place, and time.  Psychiatric:        Mood and Affect: Mood normal.        Behavior: Behavior normal.     Outpatient Encounter Medications as of 10/05/2020  Medication Sig   albuterol (PROVENTIL HFA;VENTOLIN HFA) 108 (90 Base) MCG/ACT inhaler Inhale 2 puffs into the lungs every 6 (six) hours as needed for wheezing or shortness of breath.   Cholecalciferol (VITAMIN D3 PO) Take by mouth daily.   ezetimibe-simvastatin (VYTORIN) 10-40 MG tablet TAKE 1 TABLET BY MOUTH DAILY.   Multiple Vitamin (MULTIVITAMIN) capsule Take 1 capsule by mouth daily. AM   pantoprazole (PROTONIX) 40 MG tablet TAKE ONE TABLET BY MOUTH DAILY   telmisartan (MICARDIS) 40 MG tablet TAKE 1 TABLET BY MOUTH EVERY MORNING.   vitamin B-12 (CYANOCOBALAMIN) 1000 MCG tablet Take 1,000 mcg by mouth daily.   Vitamin E 400 units TABS Take by mouth daily.   No facility-administered encounter medications on file as of 10/05/2020.     Lab Results  Component Value Date   WBC 4.9 06/02/2020   HGB 14.3 06/02/2020   HCT 41.9 06/02/2020   PLT 234.0 06/02/2020   GLUCOSE 102 (H) 10/06/2020   CHOL 133 10/05/2020   TRIG 121.0 10/05/2020   HDL 41.80 10/05/2020   LDLDIRECT 182.4 02/05/2012   LDLCALC 67 10/05/2020   ALT 31 10/05/2020   AST 26 10/05/2020   NA 141 10/06/2020   K 3.8 10/06/2020   CL 107 10/06/2020   CREATININE 0.91 10/06/2020   BUN 19 10/06/2020  CO2 22 10/06/2020   TSH 4.05 10/05/2020   INR 0.99 04/22/2012   HGBA1C 6.1 10/05/2020   MICROALBUR <0.7 09/02/2019    MM 3D SCREEN BREAST BILATERAL  Result Date: 08/19/2019 CLINICAL DATA:  Screening. EXAM: DIGITAL SCREENING BILATERAL MAMMOGRAM WITH TOMO AND CAD  COMPARISON:  Previous exam(s). ACR Breast Density Category a: The breast tissue is almost entirely fatty. FINDINGS: There are no findings suspicious for malignancy. Images were processed with CAD. IMPRESSION: No mammographic evidence of malignancy. A result letter of this screening mammogram will be mailed directly to the patient. RECOMMENDATION: Screening mammogram in one year. (Code:SM-B-01Y) BI-RADS CATEGORY  1: Negative. Electronically Signed   By: Everlean Alstrom M.D.   On: 08/19/2019 12:12       Assessment & Plan:   Problem List Items Addressed This Visit     Aortic atherosclerosis ([place])    Continue vytorin.        Barrett's esophagus    EGD 04/2019.  Followed by GI - Dr Allen Norris. Doing well - controlling with diet.  No acid reflux.  Bowels better off prilosec.  Follow.       Health care maintenance    Physical today 10/05/20. PAP 10/05/20.  Mammogram 08/19/19 [place].  She will schedule f/u mammogram.  Colonoscopy 10/2013 - diverticulosis and internal hemorrhoids.  Recommended f/u colonoscopy in 10 years, but on questioning her, she states was told since colonoscopy ok and given age - no further colonoscopy necessary.  Wants to hold on bone density.        History of HPV infection    Evaluated by gyn.  Last evaluated 03/2018.  Has previously declined f/u with gyn/f/u pap.  Repeat pap today.       Hypercholesterolemia    Continue vytorin.  Low cholesterol diet and exercise. Follow lipid panel and liver function tests.        Relevant Orders   Hepatic function panel (Completed)   Lipid panel (Completed)   Hypertension    Continue micardis.  Blood pressure doing well.  Follow pressures.  Follow metabolic panel.        Relevant Orders   TSH (Completed)   Type 2 diabetes mellitus with hyperglycemia (HCC)    Low carb diet and exercise - given increased sugar.  Check met b and a1c.       Other Visit Diagnoses     Routine general medical examination at a health care facility    -   Primary   Visit for screening mammogram       Relevant Orders   MM 3D SCREEN BREAST BILATERAL   Controlled type 2 diabetes mellitus with complication, without long-term current use of insulin (HCC)       Relevant Orders   Hemoglobin A1c (Completed)   Cervical cancer screening       Relevant Orders   Cytology - PAP( [place])        Einar Pheasant, MD

## 2020-10-06 ENCOUNTER — Other Ambulatory Visit: Payer: Self-pay | Admitting: Internal Medicine

## 2020-10-06 ENCOUNTER — Other Ambulatory Visit (INDEPENDENT_AMBULATORY_CARE_PROVIDER_SITE_OTHER): Payer: Medicare PPO

## 2020-10-06 DIAGNOSIS — I10 Essential (primary) hypertension: Secondary | ICD-10-CM | POA: Diagnosis not present

## 2020-10-06 LAB — BASIC METABOLIC PANEL
BUN: 19 mg/dL (ref 6–23)
CO2: 22 mEq/L (ref 19–32)
Calcium: 9.5 mg/dL (ref 8.4–10.5)
Chloride: 107 mEq/L (ref 96–112)
Creatinine, Ser: 0.91 mg/dL (ref 0.40–1.20)
GFR: 60.86 mL/min (ref >60.00)
Glucose, Bld: 102 mg/dL — ABNORMAL HIGH (ref 70–99)
Potassium: 3.8 mEq/L (ref 3.5–5.1)
Sodium: 141 mEq/L (ref 135–145)

## 2020-10-06 NOTE — Progress Notes (Signed)
Order placed for f/u met b 

## 2020-10-09 ENCOUNTER — Encounter: Payer: Self-pay | Admitting: Internal Medicine

## 2020-10-09 NOTE — Assessment & Plan Note (Signed)
Low carb diet and exercise - given increased sugar.  Check met b and a1c.

## 2020-10-09 NOTE — Assessment & Plan Note (Signed)
Continue micardis.  Blood pressure doing well.  Follow pressures.  Follow metabolic panel.   

## 2020-10-09 NOTE — Assessment & Plan Note (Signed)
Continue vytorin.   

## 2020-10-09 NOTE — Assessment & Plan Note (Signed)
Evaluated by gyn.  Last evaluated 03/2018.  Has previously declined f/u with gyn/f/u pap.  Repeat pap today.

## 2020-10-09 NOTE — Assessment & Plan Note (Signed)
EGD 04/2019.  Followed by GI - Dr Allen Norris. Doing well - controlling with diet.  No acid reflux.  Bowels better off prilosec.  Follow.

## 2020-10-09 NOTE — Assessment & Plan Note (Signed)
Continue vytorin.  Low cholesterol diet and exercise. Follow lipid panel and liver function tests.   

## 2020-10-11 LAB — CYTOLOGY - PAP
Comment: NEGATIVE
Diagnosis: NEGATIVE
High risk HPV: NEGATIVE

## 2020-10-25 ENCOUNTER — Ambulatory Visit
Admission: RE | Admit: 2020-10-25 | Discharge: 2020-10-25 | Disposition: A | Discharge status: Home / Self Care | Payer: Medicare PPO | Source: Ambulatory Visit | Attending: Internal Medicine | Admitting: Internal Medicine

## 2020-10-25 ENCOUNTER — Other Ambulatory Visit: Payer: Self-pay

## 2020-10-25 DIAGNOSIS — Z1231 Encounter for screening mammogram for malignant neoplasm of breast: Secondary | ICD-10-CM | POA: Diagnosis not present

## 2020-11-14 ENCOUNTER — Other Ambulatory Visit: Payer: Self-pay

## 2020-11-14 ENCOUNTER — Encounter: Payer: Self-pay | Admitting: Family

## 2020-11-14 ENCOUNTER — Telehealth (INDEPENDENT_AMBULATORY_CARE_PROVIDER_SITE_OTHER): Payer: Medicare PPO | Admitting: Family

## 2020-11-14 ENCOUNTER — Telehealth: Payer: Self-pay | Admitting: Internal Medicine

## 2020-11-14 DIAGNOSIS — R058 Other specified cough: Secondary | ICD-10-CM

## 2020-11-14 DIAGNOSIS — U071 COVID-19: Secondary | ICD-10-CM | POA: Diagnosis not present

## 2020-11-14 MED ORDER — ALBUTEROL SULFATE HFA 108 (90 BASE) MCG/ACT IN AERS: 2.0000 | INHALATION_SPRAY | Freq: Four times a day (QID) | RESPIRATORY_TRACT | 0 refills | Status: DC | PRN

## 2020-11-14 MED ORDER — HYDROCOD POLST-CPM POLST ER 10-8 MG/5ML PO SUER: 5.0000 mL | Freq: Every evening | ORAL | 0 refills | Status: AC | PRN

## 2020-11-14 NOTE — Telephone Encounter (Signed)
Providing access nurse documentation. Patient called in on 11/13/20 due to possibly having a URI. Pt is currently having a bad cough.

## 2020-11-14 NOTE — Telephone Encounter (Signed)
Patient test positive yesterday for covid . She is requesting a refill on her albuterol (PROVENTIL HFA;VENTOLIN HFA) 108 (90 Base) MCG/ACT inhaler and she needs the cough medication that Dr Nicki Reaper refilled last time she had COVID.

## 2020-11-14 NOTE — Telephone Encounter (Signed)
Called to speak with Kaitlin Dean. Pt states that her symptoms started on Friday 11/11/20 and that she tested positive on 11/13/20 with a home covid test. Pt c/o a cough and states that her symptoms have gotten better since Friday. Pt states that she wants a refill of her Albuterol due to having asthma and request a cough syrup to be sent in, the same one from her last sickness with Dr. Nicki Reaper. I had informed her that with her new sickness, she would need to be evaluated and that Dr. Nicki Reaper would want her to be seen and evaluated. Kaitlin Dean agreed and states that she will do a mychart urgent care virtual visit today at a later time. Ok to refill Albuterol? Last refilled on 02/25/20 by Philis Nettle, FNP.

## 2020-11-14 NOTE — Telephone Encounter (Signed)
Reviewed refill request (message).  Pt with covid.  I have sent in refill for albuterol.  Agree with need for evaluation given covid positive - to determine best treatment.

## 2020-11-14 NOTE — Telephone Encounter (Signed)
Refill request and Access nurse note have been sent to PCP

## 2020-11-14 NOTE — Telephone Encounter (Signed)
Patient has appt on 10/17 at 3pm

## 2020-11-14 NOTE — Assessment & Plan Note (Addendum)
Pt tested positive yesterday. Reports her main symptom is cough with mild amt of blood tinged sputum and she is concerned about continued coughing aggravating her Barrett's esophagus disorder. She is requesting the Tussionex cough syrup her PCP has given her many times in the past. Advised pt on how to use and side effects. Advised of CDC guidelines for self isolation/ ending isolation.  Advised of safe practice guidelines. Symptom Tier reviewed.  Encouraged to monitor for any worsening symptoms; watch for increased shortness of breath, weakness, and signs of dehydration. Advised when to seek emergency care.  Instructed to rest and hydrate well.  Advised to leave the house during recommended isolation period, only if it is necessary to seek medical care

## 2020-11-14 NOTE — Progress Notes (Addendum)
MyChart Video Visit    Virtual Visit via Video Note   This visit type was conducted due to national recommendations for restrictions regarding the COVID-19 Pandemic (e.g. social distancing) in an effort to limit this patient's exposure and mitigate transmission in our community. This patient is at least at moderate risk for complications without adequate follow up. This format is felt to be most appropriate for this patient at this time. Physical exam was limited by quality of the video and audio technology used for the visit. CMA was able to get the patient set up on a video visit.  Patient location: Home. Patient and provider in visit Provider location: Office  I discussed the limitations of evaluation and management by telemedicine and the availability of in person appointments. The patient expressed understanding and agreed to proceed.  Visit Date: 11/14/2020  Today's healthcare provider: Jeanie Sewer, NP     Subjective:    Patient ID: Kaitlin Dean, female    DOB: [birth year], 75 y.o.   MRN: [id number]  Chief Complaint  Patient presents with   Covid Positive    Symptoms started Friday. She has taken Robitussin DM for cough and Muccinex. She says that they are helping a lot.    Cough    She denies chest pain or SOB. She says that her PCP has already sent in Albuterol inhaler.    Headache    She denies fever.   Nasal Congestion    Cough Associated symptoms include headaches.  Headache  Associated symptoms include coughing.  Pt tested positive for covid19 symptoms yesterday, sx started 3 days ago, she is treating her symptoms with OTC meds  with some relief, but would like to restart her Tussionex syrup for her cough due to frequent coughing aggravating her Barrett's esophagus.   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)    Shoulder fracture, right 11/07/2018   Skin cancer     Past Surgical History:  Procedure Laterality Date   BREAST EXCISIONAL BIOPSY Right 2000   benign   BREAST SURGERY  2000   Biopsy   CATARACT EXTRACTION W/PHACO Right 12/05/2016   Procedure: CATARACT EXTRACTION PHACO AND INTRAOCULAR LENS PLACEMENT ([place]) RIGHT SYMFONY LENS;  Surgeon: Leandrew Koyanagi, MD;  Location: [place];  Service: Ophthalmology;  Laterality: Right;   CATARACT EXTRACTION W/PHACO Left 02/06/2017   Procedure: CATARACT EXTRACTION PHACO AND INTRAOCULAR LENS PLACEMENT ([place]) LEFT SYMFONY LENS;  Surgeon: Leandrew Koyanagi, MD;  Location: [place];  Service: Ophthalmology;  Laterality: Left;   COLONOSCOPY     ESOPHAGOGASTRODUODENOSCOPY     ESOPHAGOGASTRODUODENOSCOPY (EGD) WITH PROPOFOL N/A 06/11/2016   Procedure: ESOPHAGOGASTRODUODENOSCOPY (EGD) WITH PROPOFOL;  Surgeon: Manya Silvas, MD;  Location: [facility];  Service: Endoscopy;  Laterality: N/A;   ESOPHAGOGASTRODUODENOSCOPY (EGD) WITH PROPOFOL N/A 05/25/2019   Procedure: ESOPHAGOGASTRODUODENOSCOPY (EGD) WITH BIOPSY;  Surgeon: Lucilla Lame, MD;  Location: [place];  Service: Endoscopy;  Laterality: N/A;  Priority 3   JOINT REPLACEMENT Left    knee   PTOSIS REPAIR Bilateral 06/01/2014   Procedure: PTOSIS REPAIR;  Surgeon: Karle Starch, MD;  Location: [place];  Service: Ophthalmology;  Laterality: Bilateral;   SKIN CANCER EXCISION     TOTAL KNEE ARTHROPLASTY Left 04/30/2012   Procedure: TOTAL KNEE ARTHROPLASTY;  Surgeon: Gearlean Alf, MD;   [facility];  Service: Orthopedics;  Laterality: Left;   TUBAL LIGATION      Outpatient Medications Prior to Visit  Medication Sig Dispense Refill   albuterol (VENTOLIN HFA) 108 (90 Base) MCG/ACT inhaler Inhale 2 puffs into the lungs every 6 (six) hours as needed for wheezing or shortness of breath. 1 each 0   Cholecalciferol (VITAMIN D3 PO) Take by mouth daily.     ezetimibe-simvastatin (VYTORIN) 10-40 MG tablet TAKE 1 TABLET BY MOUTH DAILY. 90 tablet 1   Multiple Vitamin (MULTIVITAMIN) capsule Take 1 capsule by mouth daily. AM     pantoprazole (PROTONIX) 40 MG tablet TAKE ONE TABLET BY MOUTH DAILY 90 tablet 1   telmisartan (MICARDIS) 40 MG tablet TAKE 1 TABLET BY MOUTH EVERY MORNING. 90 tablet 1   vitamin B-12 (CYANOCOBALAMIN) 1000 MCG tablet Take 1,000 mcg by mouth daily.     Vitamin E 400 units TABS Take by mouth daily.     No facility-administered medications prior to visit.    Allergies  Allergen Reactions   Flagyl [Metronidazole] Nausea And Vomiting        Objective:    Physical Exam Vitals and nursing note reviewed.  Constitutional:      General: She is not in acute distress.    Appearance: Normal appearance.  HENT:     Head: Normocephalic.  Pulmonary:     Effort: No respiratory distress.  Musculoskeletal:     Cervical back: Normal range of motion.  Skin:    General: Skin is dry.     Coloration: Skin is not pale.  Neurological:     Mental Status: She is alert and oriented to person, place, and time.  Psychiatric:        Mood and Affect: Mood normal.    Ht 5\' 3"  (1.6 m)   Wt 185 lb 10 oz (84.2 kg)   LMP 01/29/1993   BMI 32.88 kg/m   Wt Readings from Last 3 Encounters:  11/14/20 185 lb 10 oz (84.2 kg)  10/05/20 185 lb 9.6 oz (84.2 kg)  06/02/20 186 lb 3.2 oz (84.5 kg)       Assessment & Plan:   Problem List Items Addressed This Visit       Other   COVID-19    Pt tested positive yesterday. Reports her main symptom is cough with mild amt of  blood tinged sputum and she is concerned about continued coughing aggravating her Barrett's esophagus disorder. She is requesting the Tussionex cough syrup her PCP has given her many times in the past. Advised pt on how to use and side effects. Advised of CDC guidelines for self isolation/ ending isolation.  Advised of safe practice guidelines. Symptom Tier reviewed.  Encouraged to monitor for any worsening symptoms; watch for increased shortness of breath, weakness, and signs of dehydration. Advised when to seek emergency care.  Instructed to rest and hydrate well.  Advised to leave the house during recommended isolation period, only if it is necessary to seek medical care  Relevant Medications   chlorpheniramine-HYDROcodone (TUSSIONEX PENNKINETIC ER) 10-8 MG/5ML SUER    I am having Ulice Brilliant. Montz start on chlorpheniramine-HYDROcodone. I am also having her maintain her multivitamin, Vitamin E, vitamin B-12, Cholecalciferol (VITAMIN D3 PO), telmisartan, ezetimibe-simvastatin, pantoprazole, and albuterol.  Meds ordered this encounter  Medications   chlorpheniramine-HYDROcodone (TUSSIONEX PENNKINETIC ER) 10-8 MG/5ML SUER    Sig: Take 5 mLs by mouth at bedtime as needed for up to 20 days for cough.    Dispense:  100 mL    Refill:  0    Order Specific Question:   Supervising Provider    Answer:   ANDY, CAMILLE L [[id number]]     I discussed the assessment and treatment plan with the patient. The patient was provided an opportunity to ask questions and all were answered. The patient agreed with the plan and demonstrated an understanding of the instructions.   The patient was advised to call back or seek an in-person evaluation if the symptoms worsen or if the condition fails to improve as anticipated.  I provided 21 minutes of face-to-face time during this encounter.   Jeanie Sewer, NP [place] [phone] (phone) [phone] (fax)  [place]

## 2020-11-16 ENCOUNTER — Telehealth: Payer: Self-pay | Admitting: Internal Medicine

## 2020-11-16 NOTE — Telephone Encounter (Signed)
Scheduled for virtual at 7:00

## 2020-11-16 NOTE — Telephone Encounter (Signed)
Patient informed, Due to the high volume of calls and your symptoms we have to forward your call to our Triage Nurse to expedient your call. Please hold for the transfer.  Patient transferred to [facility] at Access Nurse. Due to being seen on 10/17 for a positive COVID test and was prescribed a cough syrup and inhaler.She is now coughing up green phlegm and requesting an antibiotic.No openings in office or virtual for the patient to be evaluated.

## 2020-11-16 NOTE — Telephone Encounter (Signed)
Patient was calling to see if Dr Nicki Reaper called in the COVID antiviral medication.

## 2020-11-16 NOTE — Telephone Encounter (Signed)
Per review of note, symptoms started on Friday 11/11/20.  She is out of the 5 day window for antiviral.  Since I am not in the office this pm, I can work her in for virtual 7:00 in the morning (Thursday 11/17/20)

## 2020-11-16 NOTE — Telephone Encounter (Signed)
Patient stated she is having a sx of productive cough, green in color. She stated she had this last time when she had COVID-19 two years ago and she was given an antibiotic to help clear up the infection. She would like to have that called back in due to mucus turning green. Patient denies fever, chills, nausea, vomiting, SOB (managed by inhaler), chest px, jaw px, arm px, heart palpitations, and blurry vision. Patient stated her sx started last Friday and had virtual appointment on Monday 11-14-20 and received cough syrup and an inhaler. Tested positive Sunday afternoon for COVID-19.

## 2020-11-17 ENCOUNTER — Telehealth (INDEPENDENT_AMBULATORY_CARE_PROVIDER_SITE_OTHER): Payer: Medicare PPO | Admitting: Internal Medicine

## 2020-11-17 DIAGNOSIS — I10 Essential (primary) hypertension: Secondary | ICD-10-CM

## 2020-11-17 DIAGNOSIS — U071 COVID-19: Secondary | ICD-10-CM | POA: Diagnosis not present

## 2020-11-17 MED ORDER — CEFDINIR 300 MG PO CAPS: 300.0000 mg | ORAL_CAPSULE | Freq: Two times a day (BID) | ORAL | 0 refills | Status: DC

## 2020-11-17 NOTE — Progress Notes (Signed)
Virtual Visit via video Note  This visit type was conducted due to national recommendations for restrictions regarding the COVID-19 pandemic (e.g. social distancing).  This format is felt to be most appropriate for this patient at this time.  All issues noted in this document were discussed and addressed.  No physical exam was performed (except for noted visual exam findings with Video Visits).   I connected with Kaitlin Dean today at by a video enabled telemedicine application and verified that I am speaking with the correct person using two identifiers. Location patient: home Location provider: work Persons participating in the virtual visit: patient, provider  The limitations, risks, security and privacy concerns of performing an evaluation and management service by video and the availability of in person appointments have been discussed.  It has also been discussed with the patient that there may be a patient responsible charge related to this service. The patient expressed understanding and agreed to proceed.   Reason for visit: work in appt  HPI: Symptoms started Friday 11/11/20.  Tested positive for covid 11/13/20.  Evaluated acute visit 11/14/20 - with increased cough.  Was given rx for chlorpheniramine-hydrocodone.  She reports she has had no fever.  No lost of taste or smell.  Is eating.  Decreased appetite.  Has had several episodes of diarrhea.  Took imodium.  No nausea or vomiting.  Headache is better.  Taking tylenol.  No sore throat.  No chest congestion, chest pain or sob.  Has albuterol.  Does report productive green mucus.  Some occasional blood tinge, but no gross hemoptysis.     ROS: See pertinent positives and negatives per HPI.  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)    Shoulder fracture, right 11/07/2018   Skin cancer     Past Surgical History:  Procedure Laterality Date   BREAST EXCISIONAL BIOPSY Right 2000   benign   BREAST SURGERY  2000   Biopsy   CATARACT EXTRACTION W/PHACO Right 12/05/2016   Procedure: CATARACT EXTRACTION PHACO AND INTRAOCULAR LENS PLACEMENT ([place]) RIGHT SYMFONY LENS;  Surgeon: Leandrew Koyanagi, MD;  Location: [facility];  Service: Ophthalmology;  Laterality: Right;   CATARACT EXTRACTION W/PHACO Left 02/06/2017   Procedure: CATARACT EXTRACTION PHACO AND INTRAOCULAR LENS PLACEMENT ([place]) LEFT SYMFONY LENS;  Surgeon: Leandrew Koyanagi, MD;  Location: [place];  Service: Ophthalmology;  Laterality: Left;   COLONOSCOPY     ESOPHAGOGASTRODUODENOSCOPY     ESOPHAGOGASTRODUODENOSCOPY (EGD) WITH PROPOFOL N/A 06/11/2016   Procedure: ESOPHAGOGASTRODUODENOSCOPY (EGD) WITH PROPOFOL;  Surgeon: Manya Silvas, MD;  [facility]: [facility] ENDOSCOPY;  Service: Endoscopy;  Laterality: N/A;   ESOPHAGOGASTRODUODENOSCOPY (EGD) WITH PROPOFOL N/A 05/25/2019   Procedure: ESOPHAGOGASTRODUODENOSCOPY (EGD) WITH BIOPSY;  Surgeon: Lucilla Lame, MD;  Location: [place];  Service: Endoscopy;  Laterality: N/A;  Priority 3   JOINT REPLACEMENT Left  knee   PTOSIS REPAIR Bilateral 06/01/2014   Procedure: PTOSIS REPAIR;  Surgeon: Karle Starch, MD;  Location: [place];  Service: Ophthalmology;  Laterality: Bilateral;   SKIN CANCER EXCISION     TOTAL KNEE ARTHROPLASTY Left 04/30/2012   Procedure: TOTAL KNEE ARTHROPLASTY;  Surgeon: Gearlean Alf, MD;  Location: [facility];  Service: Orthopedics;  Laterality: Left;    TUBAL LIGATION      Family History  Problem Relation Age of Onset   Hypertension Mother    Congestive Heart Failure Mother    Stroke Father    Stroke Brother    Heart failure Brother    Sarcoidosis Sister        died of kidney failure   Breast cancer Neg Hx    Colon cancer Neg Hx     SOCIAL HX: reviewed.    Current Outpatient Medications:    albuterol (VENTOLIN HFA) 108 (90 Base) MCG/ACT inhaler, Inhale 2 puffs into the lungs every 6 (six) hours as needed for wheezing or shortness of breath., Disp: 1 each, Rfl: 0   cefdinir (OMNICEF) 300 MG capsule, Take 1 capsule (300 mg total) by mouth 2 (two) times daily., Disp: 20 capsule, Rfl: 0   chlorpheniramine-HYDROcodone (TUSSIONEX PENNKINETIC ER) 10-8 MG/5ML SUER, Take 5 mLs by mouth at bedtime as needed for up to 20 days for cough., Disp: 100 mL, Rfl: 0   Cholecalciferol (VITAMIN D3 PO), Take by mouth daily., Disp: , Rfl:    ezetimibe-simvastatin (VYTORIN) 10-40 MG tablet, TAKE 1 TABLET BY MOUTH DAILY., Disp: 90 tablet, Rfl: 1   Multiple Vitamin (MULTIVITAMIN) capsule, Take 1 capsule by mouth daily. AM, Disp: , Rfl:    pantoprazole (PROTONIX) 40 MG tablet, TAKE ONE TABLET BY MOUTH DAILY, Disp: 90 tablet, Rfl: 1   telmisartan (MICARDIS) 40 MG tablet, TAKE 1 TABLET BY MOUTH EVERY MORNING., Disp: 90 tablet, Rfl: 1   vitamin B-12 (CYANOCOBALAMIN) 1000 MCG tablet, Take 1,000 mcg by mouth daily., Disp: , Rfl:    Vitamin E 400 units TABS, Take by mouth daily., Disp: , Rfl:   EXAM:  VITALS per patient if applicable: pulse ox 47-42%.   GENERAL: alert, oriented, appears well and in no acute distress  HEENT: atraumatic, conjunttiva clear, no obvious abnormalities on inspection of external nose and ears  NECK: normal movements of the head and neck  LUNGS: on inspection no signs of respiratory distress, breathing rate appears normal, no obvious gross SOB, gasping or wheezing  CV: no obvious cyanosis  PSYCH/NEURO: pleasant and  cooperative, no obvious depression or anxiety, speech and thought processing grossly intact  ASSESSMENT AND PLAN:  Discussed the following assessment and plan:  Problem List Items Addressed This Visit     COVID-19    Recently diagnosed with covid.  Note reviewed.  Apparently declined oral antiviral.  Continue mucinex as directed.  (mucinex DM or mucinex/delsym).  Saline nasal spray as directed.  Steroid nasal spray.  Given now productive colored mucus, will cover with abx - given concern regarding overlying bacterial infection.  Albuterol inhaler if needed.  Quarantine guideline.  No chest pain or sob.  If any change or worsening symptoms, she is to be evaluated.        Relevant Medications   cefdinir (OMNICEF) 300 MG capsule   Hypertension    Continue micardis.  Blood pressure doing well.  Follow pressures.  Follow metabolic panel.         Return if symptoms worsen or fail to  improve.   I discussed the assessment and treatment plan with the patient. The patient was provided an opportunity to ask questions and all were answered. The patient agreed with the plan and demonstrated an understanding of the instructions.   The patient was advised to call back or seek an in-person evaluation if the symptoms worsen or if the condition fails to improve as anticipated.    Einar Pheasant, MD

## 2020-11-27 ENCOUNTER — Encounter: Payer: Self-pay | Admitting: Internal Medicine

## 2020-11-27 NOTE — Assessment & Plan Note (Signed)
Continue micardis.  Blood pressure doing well.  Follow pressures.  Follow metabolic panel.   

## 2020-11-27 NOTE — Assessment & Plan Note (Signed)
Recently diagnosed with covid.  Note reviewed.  Apparently declined oral antiviral.  Continue mucinex as directed.  (mucinex DM or mucinex/delsym).  Saline nasal spray as directed.  Steroid nasal spray.  Given now productive colored mucus, will cover with abx - given concern regarding overlying bacterial infection.  Albuterol inhaler if needed.  Quarantine guideline.  No chest pain or sob.  If any change or worsening symptoms, she is to be evaluated.

## 2020-12-16 DIAGNOSIS — Z08 Encounter for follow-up examination after completed treatment for malignant neoplasm: Secondary | ICD-10-CM | POA: Diagnosis not present

## 2020-12-16 DIAGNOSIS — X32XXXA Exposure to sunlight, initial encounter: Secondary | ICD-10-CM | POA: Diagnosis not present

## 2020-12-16 DIAGNOSIS — T22151A Burn of first degree of right shoulder, initial encounter: Secondary | ICD-10-CM | POA: Diagnosis not present

## 2020-12-16 DIAGNOSIS — D229 Melanocytic nevi, unspecified: Secondary | ICD-10-CM | POA: Diagnosis not present

## 2020-12-16 DIAGNOSIS — L570 Actinic keratosis: Secondary | ICD-10-CM | POA: Diagnosis not present

## 2020-12-16 DIAGNOSIS — Z85828 Personal history of other malignant neoplasm of skin: Secondary | ICD-10-CM | POA: Diagnosis not present

## 2020-12-20 ENCOUNTER — Other Ambulatory Visit: Payer: Self-pay | Admitting: Internal Medicine

## 2021-02-06 ENCOUNTER — Ambulatory Visit: Payer: Medicare PPO | Admitting: Internal Medicine

## 2021-02-06 ENCOUNTER — Encounter: Payer: Self-pay | Admitting: Internal Medicine

## 2021-02-06 ENCOUNTER — Other Ambulatory Visit: Payer: Self-pay

## 2021-02-06 VITALS — BP 108/70 | HR 72 | Temp 96.6°F | Ht 63.0 in | Wt 181.6 lb

## 2021-02-06 DIAGNOSIS — E1165 Type 2 diabetes mellitus with hyperglycemia: Secondary | ICD-10-CM | POA: Diagnosis not present

## 2021-02-06 DIAGNOSIS — K219 Gastro-esophageal reflux disease without esophagitis: Secondary | ICD-10-CM

## 2021-02-06 DIAGNOSIS — Z8619 Personal history of other infectious and parasitic diseases: Secondary | ICD-10-CM

## 2021-02-06 DIAGNOSIS — K449 Diaphragmatic hernia without obstruction or gangrene: Secondary | ICD-10-CM

## 2021-02-06 DIAGNOSIS — I10 Essential (primary) hypertension: Secondary | ICD-10-CM | POA: Diagnosis not present

## 2021-02-06 DIAGNOSIS — R739 Hyperglycemia, unspecified: Secondary | ICD-10-CM

## 2021-02-06 DIAGNOSIS — E7800 Pure hypercholesterolemia, unspecified: Secondary | ICD-10-CM | POA: Diagnosis not present

## 2021-02-06 DIAGNOSIS — I700 Atherosclerosis of aorta: Secondary | ICD-10-CM | POA: Diagnosis not present

## 2021-02-06 DIAGNOSIS — K2270 Barrett's esophagus without dysplasia: Secondary | ICD-10-CM

## 2021-02-06 LAB — HEPATIC FUNCTION PANEL
ALT: 35 U/L (ref 0–35)
AST: 29 U/L (ref 0–37)
Albumin: 4.6 g/dL (ref 3.5–5.2)
Alkaline Phosphatase: 77 U/L (ref 39–117)
Bilirubin, Direct: 0.2 mg/dL (ref 0.0–0.3)
Total Bilirubin: 1.2 mg/dL (ref 0.2–1.2)
Total Protein: 7.3 g/dL (ref 6.0–8.3)

## 2021-02-06 LAB — LIPID PANEL
Cholesterol: 125 mg/dL (ref 0–200)
HDL: 38.9 mg/dL — ABNORMAL LOW (ref >39.00)
LDL Cholesterol: 67 mg/dL (ref 0–99)
NonHDL: 85.94
Total CHOL/HDL Ratio: 3
Triglycerides: 93 mg/dL (ref 0.0–149.0)
VLDL: 18.6 mg/dL (ref 0.0–40.0)

## 2021-02-06 LAB — HEMOGLOBIN A1C: Hgb A1c MFr Bld: 6.1 % (ref 4.6–6.5)

## 2021-02-06 LAB — BASIC METABOLIC PANEL
BUN: 19 mg/dL (ref 6–23)
CO2: 25 mEq/L (ref 19–32)
Calcium: 9.7 mg/dL (ref 8.4–10.5)
Chloride: 104 mEq/L (ref 96–112)
Creatinine, Ser: 0.91 mg/dL (ref 0.40–1.20)
GFR: 60.71 mL/min (ref >60.00)
Glucose, Bld: 111 mg/dL — ABNORMAL HIGH (ref 70–99)
Potassium: 4.1 mEq/L (ref 3.5–5.1)
Sodium: 138 mEq/L (ref 135–145)

## 2021-02-06 NOTE — Progress Notes (Signed)
Patient ID: CITLALIC NORLANDER, female   DOB: [birth year], 76 y.o.   MRN: [id number]   Subjective:    Patient ID: JAMYE BALICKI, female    DOB: 1945-08-06, 76 y.o.   MRN: [id number]  This visit occurred during the SARS-CoV-2 public health emergency.  Safety protocols were in place, including screening questions prior to the visit, additional usage of staff PPE, and extensive cleaning of exam room while observing appropriate contact time as indicated for disinfecting solutions.   Patient here for a scheduled follow up.    HPI Covid 10/2020.  Symptom resolved.  No residual problems since covid.  Here to follow up regarding her blood pressure.  Blood pressure doing well.  Tries to stay active.  No chest pain or sob reported.  No abdominal pain or bowel change reported.    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)    Shoulder fracture, right 11/07/2018   Skin cancer    Past Surgical History:  Procedure Laterality Date   BREAST EXCISIONAL BIOPSY Right 2000   benign   BREAST SURGERY  2000   Biopsy   CATARACT EXTRACTION W/PHACO Right 12/05/2016   Procedure: CATARACT EXTRACTION PHACO AND INTRAOCULAR LENS PLACEMENT ([place]) RIGHT SYMFONY LENS;  Surgeon: Leandrew Koyanagi, MD;  Location: [place];  Service: Ophthalmology;  Laterality: Right;   CATARACT EXTRACTION W/PHACO Left 02/06/2017   Procedure: CATARACT EXTRACTION PHACO AND INTRAOCULAR LENS PLACEMENT ([place]) LEFT SYMFONY  LENS;  Surgeon: Leandrew Koyanagi, MD;  Location: [place];  Service: Ophthalmology;  Laterality: Left;   COLONOSCOPY     ESOPHAGOGASTRODUODENOSCOPY     ESOPHAGOGASTRODUODENOSCOPY (EGD) WITH PROPOFOL N/A 06/11/2016   Procedure: ESOPHAGOGASTRODUODENOSCOPY (EGD) WITH PROPOFOL;  Surgeon: Manya Silvas, MD;  Location: [facility] ENDOSCOPY;  Service: Endoscopy;  Laterality: N/A;   ESOPHAGOGASTRODUODENOSCOPY (EGD) WITH PROPOFOL N/A 05/25/2019   Procedure: ESOPHAGOGASTRODUODENOSCOPY (EGD) WITH BIOPSY;  Surgeon: Lucilla Lame, MD;  Location: [place];  Service: Endoscopy;  Laterality: N/A;  Priority 3   JOINT REPLACEMENT Left    knee   PTOSIS REPAIR Bilateral 06/01/2014   Procedure: PTOSIS REPAIR;  Surgeon: Karle Starch, MD;  Location: [place];  Service: Ophthalmology;  Laterality: Bilateral;   SKIN CANCER EXCISION     TOTAL KNEE ARTHROPLASTY Left 04/30/2012   Procedure: TOTAL KNEE ARTHROPLASTY;  Surgeon: Gearlean Alf, MD;  [facility];  Service: Orthopedics;  Laterality: Left;   TUBAL LIGATION     Family History  Problem Relation Age of Onset   Hypertension Mother    Congestive Heart Failure Mother    Stroke Father    Stroke Brother    Heart failure Brother    Sarcoidosis Sister        died of kidney failure   Breast cancer Neg Hx    Colon cancer Neg Hx    Social History  Socioeconomic History   Marital status: Widowed    Spouse name: Not on file   Number of children: 2   Years of education: Not on file   Highest education level: Not on file  Occupational History    Comment: Retired Statistician  Tobacco Use   Smoking status: Never   Smokeless tobacco: Never  Vaping Use   Vaping Use: Never used  Substance and Sexual Activity   Alcohol use: Yes    Alcohol/week: 1.0 standard drink    Types: 1 Glasses of wine per week    Comment: SOCIALLY   Drug use: No   Sexual activity: Not Currently  Other Topics Concern   Not on file  Social  History Narrative   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[employer]   Retired Diplomatic Services operational officer in the [employer]   1 son and 1 daughter, 1 son is a Social research officer, government working for [employer] and the daughter lives in [place]   Rare alcohol non-smoker   Social Determinants of Radio broadcast assistant Strain: Low Risk    Difficulty of Paying Living Expenses: Not hard at all  Food Insecurity: No Food Insecurity   Worried About Charity fundraiser in the Last Year: Never true   Arboriculturist in the Last Year: Never true  Transportation Needs: No Transportation Needs   Lack of Transportation (Medical): No   Lack of Transportation (Non-Medical): No  Physical Activity: Sufficiently Active   Days of Exercise per Week: 5 days   Minutes of Exercise per Session: 30 min  Stress: No Stress Concern Present   Feeling of Stress : Not at all  Social Connections: Unknown   Frequency of Communication with Friends and Family: More than three times a week   Frequency of Social Gatherings with Friends and Family: More than three times a week   Attends Religious Services: More than 4 times per year   Active Member of [employer] or Organizations: Yes   Attends Music therapist: More than 4 times per year   Marital Status: Not on file     Review of Systems  Constitutional:  Negative for fatigue and unexpected weight change.  HENT:  Negative for congestion and sinus pressure.   Respiratory:  Negative for cough, chest tightness and shortness of breath.   Cardiovascular:  Negative for chest pain, palpitations and leg swelling.  Gastrointestinal:  Negative for abdominal pain, diarrhea, nausea and vomiting.  Genitourinary:  Negative for difficulty urinating and dysuria.  Musculoskeletal:  Negative for joint swelling and myalgias.  Skin:  Negative for color change and rash.  Neurological:  Negative for dizziness, light-headedness and headaches.   Psychiatric/Behavioral:  Negative for agitation and dysphoric mood.       Objective:     BP 108/70 (BP Location: Left Arm, Patient Position: Sitting, Cuff Size: Normal)    Pulse 72    Temp (!) 96.6 F (35.9 C) (Temporal)    Ht _0  (1.6 m)    Wt 181 lb 9.6 oz (82.4 kg)    LMP 01/29/1993    SpO2 98%    BMI 32.17 kg/m  Wt Readings from Last 3 Encounters:  02/06/21 181 lb 9.6 oz (82.4 kg)  11/17/20 185 lb (83.9 kg)  11/14/20 185 lb 10 oz (84.2 kg)    Physical Exam Vitals reviewed.  Constitutional:      General: She is not in acute distress.  Appearance: Normal appearance.  HENT:     Head: Normocephalic and atraumatic.     Right Ear: External ear normal.     Left Ear: External ear normal.  Eyes:     General: No scleral icterus.       Right eye: No discharge.        Left eye: No discharge.     Conjunctiva/sclera: Conjunctivae normal.  Neck:     Thyroid: No thyromegaly.  Cardiovascular:     Rate and Rhythm: Normal rate and regular rhythm.  Pulmonary:     Effort: No respiratory distress.     Breath sounds: Normal breath sounds. No wheezing.  Abdominal:     General: Bowel sounds are normal.     Palpations: Abdomen is soft.     Tenderness: There is no abdominal tenderness.  Musculoskeletal:        General: No swelling or tenderness.     Cervical back: Neck supple. No tenderness.  Lymphadenopathy:     Cervical: No cervical adenopathy.  Skin:    Findings: No erythema or rash.  Neurological:     Mental Status: She is alert.  Psychiatric:        Mood and Affect: Mood normal.        Behavior: Behavior normal.     Outpatient Encounter Medications as of 02/06/2021  Medication Sig   albuterol (VENTOLIN HFA) 108 (90 Base) MCG/ACT inhaler Inhale 2 puffs into the lungs every 6 (six) hours as needed for wheezing or shortness of breath.   Cholecalciferol (VITAMIN D3 PO) Take by mouth daily.   ezetimibe-simvastatin (VYTORIN) 10-40 MG tablet TAKE 1 TABLET BY MOUTH DAILY.    Multiple Vitamin (MULTIVITAMIN) capsule Take 1 capsule by mouth daily. AM   pantoprazole (PROTONIX) 40 MG tablet TAKE ONE TABLET BY MOUTH DAILY   telmisartan (MICARDIS) 40 MG tablet TAKE 1 TABLET BY MOUTH EVERY MORNING.   vitamin B-12 (CYANOCOBALAMIN) 1000 MCG tablet Take 1,000 mcg by mouth daily.   Vitamin E 400 units TABS Take by mouth daily.   [DISCONTINUED] cefdinir (OMNICEF) 300 MG capsule Take 1 capsule (300 mg total) by mouth 2 (two) times daily. (Patient not taking: Reported on 02/06/2021)   No facility-administered encounter medications on file as of 02/06/2021.     Lab Results  Component Value Date   WBC 4.9 06/02/2020   HGB 14.3 06/02/2020   HCT 41.9 06/02/2020   PLT 234.0 06/02/2020   GLUCOSE 111 (H) 02/06/2021   CHOL 125 02/06/2021   TRIG 93.0 02/06/2021   HDL 38.90 (L) 02/06/2021   LDLDIRECT 182.4 02/05/2012   LDLCALC 67 02/06/2021   ALT 35 02/06/2021   AST 29 02/06/2021   NA 138 02/06/2021   K 4.1 02/06/2021   CL 104 02/06/2021   CREATININE 0.91 02/06/2021   BUN 19 02/06/2021   CO2 25 02/06/2021   TSH 4.05 10/05/2020   INR 0.99 04/22/2012   HGBA1C 6.1 02/06/2021   MICROALBUR <0.7 09/02/2019    MM 3D SCREEN BREAST BILATERAL  Result Date: 11/01/2020 CLINICAL DATA:  Screening. EXAM: DIGITAL SCREENING BILATERAL MAMMOGRAM WITH TOMOSYNTHESIS AND CAD TECHNIQUE: Bilateral screening digital craniocaudal and mediolateral oblique mammograms were obtained. Bilateral screening digital breast tomosynthesis was performed. The images were evaluated with computer-aided detection. COMPARISON:  Previous exam(s). ACR Breast Density Category b: There are scattered areas of fibroglandular density. FINDINGS: There are no findings suspicious for malignancy. IMPRESSION: No mammographic evidence of malignancy. A result letter of this screening mammogram will be mailed  directly to the patient. RECOMMENDATION: Screening mammogram in one year. (Code:SM-B-01Y) BI-RADS CATEGORY  1: Negative.  Electronically Signed   By: Franki Cabot M.D.   On: 11/01/2020 09:51      Assessment & Plan:   Problem List Items Addressed This Visit     Aortic atherosclerosis ([place])    Continue vytorin.        Barrett's esophagus    EGD 04/2019.  Followed by GI - Dr Allen Norris. On protonix.  Follow.       Hiatal hernia with GERD    No upper symptoms reported.  Continue protonix.       History of HPV infection    PAP 09/2020 - negative with negative HPV.       Hypercholesterolemia    Continue vytorin.  Low cholesterol diet and exercise. Follow lipid panel and liver function tests.        Relevant Orders   Lipid panel (Completed)   Hepatic function panel (Completed)   Hyperglycemia    Low carb diet and exercise.  Follow met b and a1c.       Hypertension - Primary    Continue micardis.  Blood pressure doing well.  Follow pressures.  Follow metabolic panel.        Type 2 diabetes mellitus with hyperglycemia ([facility])    Low carb diet and exercise - given increased sugar.  Check met b and a1c.       Relevant Orders   Hemoglobin A1c (Completed)   Basic metabolic panel (Completed)     Einar Pheasant, MD

## 2021-02-11 ENCOUNTER — Encounter: Payer: Self-pay | Admitting: Internal Medicine

## 2021-02-11 NOTE — Assessment & Plan Note (Signed)
No upper symptoms reported.  Continue protonix.  

## 2021-02-11 NOTE — Assessment & Plan Note (Signed)
EGD 04/2019.  Followed by GI - Dr Wohl. On protonix.  Follow.  

## 2021-02-11 NOTE — Assessment & Plan Note (Signed)
Low carb diet and exercise - given increased sugar.  Check met b and a1c.  

## 2021-02-11 NOTE — Assessment & Plan Note (Signed)
PAP 09/2020 - negative with negative HPV.  

## 2021-02-11 NOTE — Assessment & Plan Note (Signed)
Continue vytorin.   

## 2021-02-11 NOTE — Assessment & Plan Note (Signed)
Continue vytorin.  Low cholesterol diet and exercise. Follow lipid panel and liver function tests.   

## 2021-02-11 NOTE — Assessment & Plan Note (Signed)
Low carb diet and exercise.  Follow met b and a1c.

## 2021-02-11 NOTE — Assessment & Plan Note (Signed)
Continue micardis.  Blood pressure doing well.  Follow pressures.  Follow metabolic panel.   

## 2021-02-27 ENCOUNTER — Ambulatory Visit (INDEPENDENT_AMBULATORY_CARE_PROVIDER_SITE_OTHER): Payer: Medicare PPO

## 2021-02-27 VITALS — Ht 63.0 in | Wt 181.0 lb

## 2021-02-27 DIAGNOSIS — Z0000 Encounter for general adult medical examination without abnormal findings: Secondary | ICD-10-CM

## 2021-02-27 NOTE — Patient Instructions (Addendum)
Kaitlin Dean , Thank you for taking time to come for your Medicare Wellness Visit. I appreciate your ongoing commitment to your health goals. Please review the following plan we discussed and let me know if I can assist you in the future.   These are the goals we discussed:  Goals      Maintain Healthy Lifestyle     Make healthy food choices Stay active        This is a list of the screening recommended for you and due dates:  Health Maintenance  Topic Date Due   Complete foot exam   06/06/2021*   COVID-19 Vaccine (4 - Booster for Pfizer series) 01/29/2022*   Tetanus Vaccine  02/27/2022*   Hemoglobin A1C  08/06/2021   Eye exam for diabetics  09/12/2021   Mammogram  10/25/2021   Pneumonia Vaccine  Completed   Flu Shot  Completed   DEXA scan (bone density measurement)  Completed   Hepatitis C Screening: USPSTF Recommendation to screen - Ages 77-79 yo.  Completed   Zoster (Shingles) Vaccine  Completed   HPV Vaccine  Aged Out   Colon Cancer Screening  Discontinued  *Topic was postponed. The date shown is not the original due date.   Advanced directives: End of life planning; Advance aging; Advanced directives discussed.  Copy of current HCPOA/Living Will requested.    Conditions/risks identified: none new  Follow up in one year for your annual wellness visit    Preventive Care 65 Years and Older, Female Preventive care refers to lifestyle choices and visits with your health care provider that can promote health and wellness. What does preventive care include? A yearly physical exam. This is also called an annual well check. Dental exams once or twice a year. Routine eye exams. Ask your health care provider how often you should have your eyes checked. Personal lifestyle choices, including: Daily care of your teeth and gums. Regular physical activity. Eating a healthy diet. Avoiding tobacco and drug use. Limiting alcohol use. Practicing safe sex. Taking low-dose aspirin  every day. Taking vitamin and mineral supplements as recommended by your health care provider. What happens during an annual well check? The services and screenings done by your health care provider during your annual well check will depend on your age, overall health, lifestyle risk factors, and family history of disease. Counseling  Your health care provider may ask you questions about your: Alcohol use. Tobacco use. Drug use. Emotional well-being. Home and relationship well-being. Sexual activity. Eating habits. History of falls. Memory and ability to understand (cognition). Work and work Statistician. Reproductive health. Screening  You may have the following tests or measurements: Height, weight, and BMI. Blood pressure. Lipid and cholesterol levels. These may be checked every 5 years, or more frequently if you are over 76 years old. Skin check. Lung cancer screening. You may have this screening every year starting at age 2 if you have a 30-pack-year history of smoking and currently smoke or have quit within the past 15 years. Fecal occult blood test (FOBT) of the stool. You may have this test every year starting at age 64. Flexible sigmoidoscopy or colonoscopy. You may have a sigmoidoscopy every 5 years or a colonoscopy every 10 years starting at age 59. Hepatitis C blood test. Hepatitis B blood test. Sexually transmitted disease (STD) testing. Diabetes screening. This is done by checking your blood sugar (glucose) after you have not eaten for a while (fasting). You may have this done every 1-3 years.  Bone density scan. This is done to screen for osteoporosis. You may have this done starting at age 60. Mammogram. This may be done every 1-2 years. Talk to your health care provider about how often you should have regular mammograms. Talk with your health care provider about your test results, treatment options, and if necessary, the need for more tests. Vaccines  Your health  care provider may recommend certain vaccines, such as: Influenza vaccine. This is recommended every year. Tetanus, diphtheria, and acellular pertussis (Tdap, Td) vaccine. You may need a Td booster every 10 years. Zoster vaccine. You may need this after age 45. Pneumococcal 13-valent conjugate (PCV13) vaccine. One dose is recommended after age 63. Pneumococcal polysaccharide (PPSV23) vaccine. One dose is recommended after age 39. Talk to your health care provider about which screenings and vaccines you need and how often you need them. This information is not intended to replace advice given to you by your health care provider. Make sure you discuss any questions you have with your health care provider. Document Released: 02/11/2015 Document Revised: 10/05/2015 Document Reviewed: 11/16/2014 Elsevier Interactive Patient Education  2017 [place] Prevention in the Home Falls can cause injuries. They can happen to people of all ages. There are many things you can do to make your home safe and to help prevent falls. What can I do on the outside of my home? Regularly fix the edges of walkways and driveways and fix any cracks. Remove anything that might make you trip as you walk through a door, such as a raised step or threshold. Trim any bushes or trees on the path to your home. Use bright outdoor lighting. Clear any walking paths of anything that might make someone trip, such as rocks or tools. Regularly check to see if handrails are loose or broken. Make sure that both sides of any steps have handrails. Any raised decks and porches should have guardrails on the edges. Have any leaves, snow, or ice cleared regularly. Use sand or salt on walking paths during winter. Clean up any spills in your garage right away. This includes oil or grease spills. What can I do in the bathroom? Use night lights. Install grab bars by the toilet and in the tub and shower. Do not use towel bars as grab  bars. Use non-skid mats or decals in the tub or shower. If you need to sit down in the shower, use a plastic, non-slip stool. Keep the floor dry. Clean up any water that spills on the floor as soon as it happens. Remove soap buildup in the tub or shower regularly. Attach bath mats securely with double-sided non-slip rug tape. Do not have throw rugs and other things on the floor that can make you trip. What can I do in the bedroom? Use night lights. Make sure that you have a light by your bed that is easy to reach. Do not use any sheets or blankets that are too big for your bed. They should not hang down onto the floor. Have a firm chair that has side arms. You can use this for support while you get dressed. Do not have throw rugs and other things on the floor that can make you trip. What can I do in the kitchen? Clean up any spills right away. Avoid walking on wet floors. Keep items that you use a lot in easy-to-reach places. If you need to reach something above you, use a strong step stool that has a grab bar.  Keep electrical cords out of the way. Do not use floor polish or wax that makes floors slippery. If you must use wax, use non-skid floor wax. Do not have throw rugs and other things on the floor that can make you trip. What can I do with my stairs? Do not leave any items on the stairs. Make sure that there are handrails on both sides of the stairs and use them. Fix handrails that are broken or loose. Make sure that handrails are as long as the stairways. Check any carpeting to make sure that it is firmly attached to the stairs. Fix any carpet that is loose or worn. Avoid having throw rugs at the top or bottom of the stairs. If you do have throw rugs, attach them to the floor with carpet tape. Make sure that you have a light switch at the top of the stairs and the bottom of the stairs. If you do not have them, ask someone to add them for you. What else can I do to help prevent  falls? Wear shoes that: Do not have high heels. Have rubber bottoms. Are comfortable and fit you well. Are closed at the toe. Do not wear sandals. If you use a stepladder: Make sure that it is fully opened. Do not climb a closed stepladder. Make sure that both sides of the stepladder are locked into place. Ask someone to hold it for you, if possible. Clearly mark and make sure that you can see: Any grab bars or handrails. First and last steps. Where the edge of each step is. Use tools that help you move around (mobility aids) if they are needed. These include: Canes. Walkers. Scooters. Crutches. Turn on the lights when you go into a dark area. Replace any light bulbs as soon as they burn out. Set up your furniture so you have a clear path. Avoid moving your furniture around. If any of your floors are uneven, fix them. If there are any pets around you, be aware of where they are. Review your medicines with your doctor. Some medicines can make you feel dizzy. This can increase your chance of falling. Ask your doctor what other things that you can do to help prevent falls. This information is not intended to replace advice given to you by your health care provider. Make sure you discuss any questions you have with your health care provider. Document Released: 11/11/2008 Document Revised: 06/23/2015 Document Reviewed: 02/19/2014 Elsevier Interactive Patient Education  2017 [employer].

## 2021-02-27 NOTE — Progress Notes (Signed)
Subjective:   Kaitlin Dean is a 76 y.o. female who presents for Medicare Annual (Subsequent) preventive examination.  Review of Systems    No ROS.  Medicare Wellness Virtual Visit.  Visual/audio telehealth visit, UTA vital signs.   See social history for additional risk factors.   Cardiac Risk Factors include: advanced age (>48men, >33 women)     Objective:    Today's Vitals   02/27/21 0828  Weight: 181 lb (82.1 kg)  Height: 5\' 3"  (1.6 m)   Body mass index is 32.06 kg/m.  Advanced Directives 02/27/2021 02/25/2020 05/25/2019 03/17/2019 02/24/2019 12/16/2018 02/21/2018  Does Patient Have a Medical Advance Directive? Yes Yes Yes Yes Yes Yes Yes  Type of Paramedic of [place];Living will [place];Living will [place];Living will [place];Living will [place];Living will Living will;Healthcare Power of [place];Living will  Does patient want to make changes to medical advance directive? No - Patient declined No - Patient declined - - No - Patient declined No - Patient declined No - Patient declined  Copy of [place] in Chart? No - copy requested No - copy requested Yes - validated most recent copy scanned in chart (See row information) No - copy requested No - copy requested - No - copy requested  Pre-existing out of facility DNR order (yellow form or pink MOST form) - - - - - - -    Current Medications (verified) Outpatient Encounter Medications as of 02/27/2021  Medication Sig   albuterol (VENTOLIN HFA) 108 (90 Base) MCG/ACT inhaler Inhale 2 puffs into the lungs every 6 (six) hours as needed for wheezing or shortness of breath.   Cholecalciferol (VITAMIN D3 PO) Take by mouth daily.   ezetimibe-simvastatin (VYTORIN) 10-40 MG tablet TAKE 1 TABLET BY MOUTH DAILY.   Multiple Vitamin (MULTIVITAMIN) capsule Take 1 capsule by mouth daily.  AM   pantoprazole (PROTONIX) 40 MG tablet TAKE ONE TABLET BY MOUTH DAILY   telmisartan (MICARDIS) 40 MG tablet TAKE 1 TABLET BY MOUTH EVERY MORNING.   vitamin B-12 (CYANOCOBALAMIN) 1000 MCG tablet Take 1,000 mcg by mouth daily.   Vitamin E 400 units TABS Take by mouth daily.   No facility-administered encounter medications on file as of 02/27/2021.    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)    Shoulder fracture, right 11/07/2018   Skin cancer    Past Surgical History:  Procedure Laterality Date   BREAST EXCISIONAL BIOPSY Right 2000   benign   BREAST SURGERY  2000   Biopsy   CATARACT EXTRACTION W/PHACO Right 12/05/2016   Procedure: CATARACT EXTRACTION PHACO AND INTRAOCULAR LENS PLACEMENT ([place]) RIGHT SYMFONY LENS;  Surgeon: Leandrew Koyanagi, MD;  Location: [facility];  Service: Ophthalmology;  Laterality:  Right;   CATARACT EXTRACTION W/PHACO Left 02/06/2017   Procedure: CATARACT EXTRACTION PHACO AND INTRAOCULAR LENS PLACEMENT ([place]) LEFT SYMFONY LENS;  Surgeon: Leandrew Koyanagi, MD;  Location: [facility];  Service: Ophthalmology;  Laterality: Left;   COLONOSCOPY     ESOPHAGOGASTRODUODENOSCOPY     ESOPHAGOGASTRODUODENOSCOPY (EGD) WITH PROPOFOL N/A 06/11/2016   Procedure: ESOPHAGOGASTRODUODENOSCOPY (EGD) WITH PROPOFOL;  Surgeon: Manya Silvas, MD;  Location: [facility];  Service:  Endoscopy;  Laterality: N/A;   ESOPHAGOGASTRODUODENOSCOPY (EGD) WITH PROPOFOL N/A 05/25/2019   Procedure: ESOPHAGOGASTRODUODENOSCOPY (EGD) WITH BIOPSY;  Surgeon: Lucilla Lame, MD;  Location: [facility];  Service: Endoscopy;  Laterality: N/A;  Priority 3   JOINT REPLACEMENT Left    knee   PTOSIS REPAIR Bilateral 06/01/2014   Procedure: PTOSIS REPAIR;  Surgeon: Karle Starch, MD;  Location: [address];  Service: Ophthalmology;  Laterality: Bilateral;   SKIN CANCER EXCISION     TOTAL KNEE ARTHROPLASTY Left 04/30/2012   Procedure: TOTAL KNEE ARTHROPLASTY;  Surgeon: Gearlean Alf, MD;  Location: [facility];  Service: Orthopedics;  Laterality: Left;   TUBAL LIGATION     Family History  Problem Relation Age of Onset   Hypertension Mother    Congestive Heart Failure Mother    Stroke Father    Stroke Brother    Heart failure Brother    Sarcoidosis Sister        died of kidney failure   Breast cancer Neg Hx    Colon cancer Neg Hx    Social History   Socioeconomic History   Marital status: Widowed    Spouse name: Not on file   Number of children: 2   Years of education: Not on file   Highest education level: Not on file  Occupational History    Comment: Retired Statistician  Tobacco Use   Smoking status: Never   Smokeless tobacco: Never  Vaping Use   Vaping Use: Never used  Substance and Sexual Activity   Alcohol use: Yes    Alcohol/week: 1.0 standard drink    Types: 1 Glasses of wine per week    Comment: SOCIALLY   Drug use: No   Sexual activity: Not Currently  Other Topics Concern   Not on file  Social History Narrative   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[employer]   Retired Diplomatic Services operational officer in the [employer]   1 son and 1 daughter, 1 son is a Social research officer, government working for [employer] and the daughter lives in [place]   Rare alcohol non-smoker   Social Determinants of Radio broadcast assistant Strain: Low Risk     Difficulty of Paying Living Expenses: Not hard at all  Food Insecurity: No Food Insecurity   Worried About Charity fundraiser in the Last Year: Never true   Arboriculturist in the Last Year: Never true  Transportation Needs: No Transportation Needs   Lack of Transportation (Medical): No   Lack of Transportation (Non-Medical): No  Physical Activity: Sufficiently Active   Days of Exercise per Week: 4 days   Minutes of Exercise per Session: 50 min  Stress: No Stress Concern Present   Feeling of Stress : Not at all  Social Connections: Unknown   Frequency of Communication with Friends and Family: More than three times a week   Frequency of Social Gatherings with Friends and Family: More than three times a week   Attends Religious Services: More  than 4 times per year   Active Member of Clubs or Organizations: Yes   Attends Music therapist: More than 4 times per year   Marital Status: Not on file    Tobacco Counseling Counseling given: Not Answered   Clinical Intake:  Pre-visit preparation completed: Yes        Diabetes:  (Followed by PCP)  How often do you need to have someone help you when you read instructions, pamphlets, or other written materials from your doctor or pharmacy?: 1 - Never   Interpreter Needed?: No      Activities of Daily Living In your present state of health, do you have any difficulty performing the following activities: 02/27/2021  Hearing? N  Vision? N  Difficulty concentrating or making decisions? N  Walking or climbing stairs? N  Dressing or bathing? N  Doing errands, shopping? N  Preparing Food and eating ? N  Using the Toilet? N  In the past six months, have you accidently leaked urine? N  Do you have problems with loss of bowel control? N  Managing your Medications? N  Managing your Finances? N  Housekeeping or managing your Housekeeping? N  Some recent data might be hidden   Patient Care Team: Einar Pheasant, MD as  PCP - General (Internal Medicine)  Indicate any recent Medical Services you may have received from other than [facility] providers in the past year (date may be approximate).     Assessment:   This is a routine wellness examination for Kaitlin Dean.  Virtual Visit via Telephone Note  I connected with  Kaitlin Dean on 02/27/21 at  8:15 AM EST by telephone and verified that I am speaking with the correct person using two identifiers.  Persons participating in the virtual visit: patient/Nurse Health Advisor   I discussed the limitations, risks, security and privacy concerns of performing an evaluation and management service by telephone and the availability of in person appointments. The patient expressed understanding and agreed to proceed.  Interactive audio and video telecommunications were attempted between this nurse and patient, however failed, due to patient having technical difficulties OR patient did not have access to video capability.  We continued and completed visit with audio only.  Some vital signs may be absent or patient reported.   Hearing/Vision screen Hearing Screening - Comments:: Patient is able to hear conversational tones without difficulty.  No issues reported. Vision Screening - Comments:: Followed by [facility] They have seen their ophthalmologist in the last 12 months.   Dietary issues and exercise activities discussed: Current Exercise Habits: Home exercise routine, Type of exercise: walking (Water aerobics), Time (Minutes): 45, Frequency (Times/Week): 3, Weekly Exercise (Minutes/Week): 135, Intensity: Mild Low carb diet Good water intake   Goals Addressed             This Visit's Progress    Maintain Healthy Lifestyle       Make healthy food choices Stay active     COMPLETED: Weight (lb) < 190 lb (86.2 kg)   181 lb (82.1 kg)    Lose 10lbs       Depression Screen PHQ 2/9 Scores 02/27/2021 11/17/2020 02/25/2020 02/24/2019 02/21/2018 01/24/2018  02/19/2017  PHQ - 2 Score 0 0 0 0 0 1 0  PHQ- 9 Score - - - - - 7 0    Fall Risk Fall Risk  02/27/2021 11/14/2020 02/25/2020 02/24/2019 02/21/2018  Falls in the past year? 0 0 0 1 1  Number falls  in past yr: - - 0 1 0  Injury with Fall? - - 0 1 -  Comment - - - Broke arm, fell due to [employer]. No falls since last reported in 10/2018 -  Risk for fall due to : - No Fall Risks - - -  Follow up Falls evaluation completed - Falls evaluation completed Falls evaluation completed;Falls prevention discussed -   FALL RISK PREVENTION PERTAINING TO THE HOME: Home free of loose throw rugs in walkways, pet beds, electrical cords, etc? Yes  Adequate lighting in your home to reduce risk of falls? Yes   ASSISTIVE DEVICES UTILIZED TO PREVENT FALLS: Life alert? No  Smart watch for falls? Yes Use of a cane, walker or w/c? No  Grab bars in the bathroom? Yes  Shower chair or bench in shower? Yes  Elevated toilet seat or a handicapped toilet? No   TIMED UP AND GO: Was the test performed? No .   Cognitive Function: Patient is alert and oriented x3.  Enjoys socializing, reading and other brain health stimulating activities.   MMSE - Mini Mental State Exam 02/19/2017 02/14/2015  Orientation to time 5 5  Orientation to Place 5 5  Registration 3 3  Attention/ Calculation 5 5  Recall 3 3  Language- name 2 objects 2 2  Language- repeat 1 1  Language- follow 3 step command 3 3  Language- read & follow direction 1 1  Write a sentence 1 1  Copy design 1 1  Total score 30 30     6CIT Screen 02/24/2019 02/21/2018 02/20/2016  What Year? 0 points 0 points 0 points  What month? 0 points 0 points 0 points  What time? 0 points 0 points 0 points  Count back from 20 - 0 points 0 points  Months in reverse - 0 points 0 points  Repeat phrase - 0 points 0 points  Total Score - 0 0   Immunizations Immunization History  Administered Date(s) Administered   Influenza Split 10/30/2011, 11/14/2012   Influenza,  High Dose Seasonal PF 11/01/2015, 11/09/2016, 11/18/2017, 12/03/2018   Influenza,inj,Quad PF,6+ Mos 11/12/2013, 01/18/2015   Influenza-Unspecified 11/23/2011, 12/15/2019, 11/29/2020   PFIZER(Purple Top)SARS-COV-2 Vaccination 02/04/2019, 02/25/2019, 10/26/2019   Pneumococcal Conjugate-13 08/27/2013   Pneumococcal Polysaccharide-23 08/21/2017   Zoster Recombinat (Shingrix) 10/01/2017, 01/03/2018   Zoster, Live 03/29/2010   TDAP status: Due, Education has been provided regarding the importance of this vaccine. Advised may receive this vaccine at local pharmacy or Health Dept. Aware to provide a copy of the vaccination record if obtained from local pharmacy or Health Dept. Verbalized acceptance and understanding. Deferred.   Screening Tests Health Maintenance  Topic Date Due   FOOT EXAM  06/06/2021 (Originally 01/13/2021)   COVID-19 Vaccine (4 - Booster for [place] series) 01/29/2022 (Originally 12/21/2019)   TETANUS/TDAP  02/27/2022 (Originally 01/30/2020)   HEMOGLOBIN A1C  08/06/2021   OPHTHALMOLOGY EXAM  09/12/2021   MAMMOGRAM  10/25/2021   Pneumonia Vaccine 29+ Years old  Completed   INFLUENZA VACCINE  Completed   DEXA SCAN  Completed   Hepatitis C Screening  Completed   Zoster Vaccines- Shingrix  Completed   HPV VACCINES  Aged Out   COLONOSCOPY (Pts 45-7yrs Insurance coverage will need to be confirmed)  Discontinued   Health Maintenance There are no preventive care reminders to display for this patient.  Lung Cancer Screening: (Low Dose CT Chest recommended if Age 56-80 years, 30 pack-year currently smoking OR have quit w/in 15years.) does not qualify.  Vision Screening: Recommended annual ophthalmology exams for early detection of glaucoma and other disorders of the eye.  Dental Screening: Recommended annual dental exams for proper oral hygiene  Community Resource Referral / Chronic Care Management: CRR required this visit?  No   CCM required this visit?  No      Plan:    Keep all routine maintenance appointments.   I have personally reviewed and noted the following in the patients chart:   Medical and social history Use of alcohol, tobacco or illicit drugs  Current medications and supplements including opioid prescriptions. Not taking opioid.  Functional ability and status Nutritional status Physical activity Advanced directives List of other physicians Hospitalizations, surgeries, and ER visits in previous 12 months Vitals Screenings to include cognitive, depression, and falls Referrals and appointments  In addition, I have reviewed and discussed with patient certain preventive protocols, quality metrics, and best practice recommendations. A written personalized care plan for preventive services as well as general preventive health recommendations were provided to patient.     Varney Biles, LPN   [id number]

## 2021-03-09 ENCOUNTER — Other Ambulatory Visit: Payer: Self-pay | Admitting: Internal Medicine

## 2021-04-25 DIAGNOSIS — Z961 Presence of intraocular lens: Secondary | ICD-10-CM | POA: Diagnosis not present

## 2021-04-25 DIAGNOSIS — Z0100 Encounter for examination of eyes and vision without abnormal findings: Secondary | ICD-10-CM | POA: Diagnosis not present

## 2021-05-30 ENCOUNTER — Encounter: Payer: Self-pay | Admitting: Family Medicine

## 2021-05-30 ENCOUNTER — Telehealth (INDEPENDENT_AMBULATORY_CARE_PROVIDER_SITE_OTHER): Payer: Medicare PPO | Admitting: Family Medicine

## 2021-05-30 DIAGNOSIS — J0100 Acute maxillary sinusitis, unspecified: Secondary | ICD-10-CM | POA: Diagnosis not present

## 2021-05-30 DIAGNOSIS — J329 Chronic sinusitis, unspecified: Secondary | ICD-10-CM | POA: Insufficient documentation

## 2021-05-30 DIAGNOSIS — J019 Acute sinusitis, unspecified: Secondary | ICD-10-CM | POA: Insufficient documentation

## 2021-05-30 MED ORDER — AMOXICILLIN-POT CLAVULANATE 875-125 MG PO TABS: 1.0000 | ORAL_TABLET | Freq: Two times a day (BID) | ORAL | 0 refills | Status: DC

## 2021-05-30 NOTE — Progress Notes (Signed)
? ?Virtual Visit via video Note ? ?This visit type was conducted due to national recommendations for restrictions regarding the COVID-19 pandemic (e.g. social distancing).  This format is felt to be most appropriate for this patient at this time.  All issues noted in this document were discussed and addressed.  No physical exam was performed (except for noted visual exam findings with Video Visits).  ? ?I connected with Kaitlin Dean today at  1:00 PM EDT by a video enabled telemedicine application and verified that I am speaking with the correct person using two identifiers. ?Location patient: home ?Location provider: work  ?Persons participating in the virtual visit: patient, provider ? ?I discussed the limitations, risks, security and privacy concerns of performing an evaluation and management service by telephone and the availability of in person appointments. I also discussed with the patient that there may be a patient responsible charge related to this service. The patient expressed understanding and agreed to proceed. ? ? ?Reason for visit: same day visit ? ?HPI: ?Respiratory infection: Patient reports about a week ago she started to feel sick.  She went on a cruise and a lot of people around her developed sniffles and congestion.  She started with cough and congestion and notes the cough has subsided but she still has sinus congestion and is coughing up and blowing green mucus out of her nose.  She notes no fevers.  No shortness of breath.  Mild postnasal drip.  No loss of taste or smell.  She did not test her cell for COVID.  She notes no known COVID exposures.  Her roommate on the cruise was sick and did test for COVID though the test was negative. ? ? ?ROS: See pertinent positives and negatives per HPI. ? ?Past Medical History:  ?Diagnosis Date  ? Arthritis   ? Knee before replacement  ? Barrett's esophagus   ? Chicken pox   ? Complication of anesthesia   ? woke up 15 years ago and felt like could not  breathe - no complications   ? Diverticulosis of colon without hemorrhage 02/28/2014  ? GERD (gastroesophageal reflux disease)   ? History of fractured kneecap   ? History of fractured pelvis   ? History of hiatal hernia   ? Hypercholesterolemia   ? Hypertension   ? Osteoarthritis   ? Osteoporosis   ? Pneumonia   ? in past  ? 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 Shoulder fracture, right 11/07/2018  ? Skin cancer   ? ? ?Past Surgical History:  ?Procedure Laterality Date  ? BREAST EXCISIONAL BIOPSY Right 2000  ? benign  ? BREAST SURGERY  2000  ? Biopsy  ? CATARACT EXTRACTION W/PHACO Right 12/05/2016  ? Procedure: CATARACT EXTRACTION PHACO AND INTRAOCULAR LENS PLACEMENT ([place]) RIGHT SYMFONY LENS;  Surgeon: Leandrew Koyanagi, MD;  Location: [place];  Service: Ophthalmology;  Laterality: Right;  ? CATARACT EXTRACTION W/PHACO Left 02/06/2017  ? Procedure: CATARACT EXTRACTION PHACO AND INTRAOCULAR LENS PLACEMENT ([place]) LEFT SYMFONY LENS;  Surgeon: Leandrew Koyanagi, MD;  Location: [place];  Service: Ophthalmology;  Laterality: Left;  ? COLONOSCOPY    ? ESOPHAGOGASTRODUODENOSCOPY    ? ESOPHAGOGASTRODUODENOSCOPY (EGD) WITH PROPOFOL N/A 06/11/2016  ? Procedure: ESOPHAGOGASTRODUODENOSCOPY (EGD) WITH PROPOFOL;  Surgeon: Manya Silvas, MD;  Location: [facility] ENDOSCOPY;  Service: Endoscopy;  Laterality: N/A;  ? ESOPHAGOGASTRODUODENOSCOPY (EGD) WITH PROPOFOL N/A 05/25/2019  ? Procedure: ESOPHAGOGASTRODUODENOSCOPY (EGD) WITH BIOPSY;  Surgeon: Lucilla Lame, MD;  Location: [place];  Service: Endoscopy;  Laterality: N/A;  Priority 3  ? JOINT REPLACEMENT Left   ? knee  ? PTOSIS REPAIR Bilateral 06/01/2014  ? Procedure: PTOSIS REPAIR;  Surgeon: Karle Starch, MD;  Location: [place];  Service: Ophthalmology;  Laterality: Bilateral;  ? SKIN CANCER EXCISION    ? TOTAL KNEE ARTHROPLASTY Left 04/30/2012  ? Procedure: TOTAL KNEE ARTHROPLASTY;  Surgeon: Gearlean Alf, MD;  [facility];  Service: Orthopedics;  Laterality: Left;  ? TUBAL LIGATION    ? ? ?Family History  ?Problem Relation Age of Onset  ? Hypertension Mother   ? Congestive Heart Failure Mother   ? Stroke Father   ? Stroke Brother   ? Heart failure Brother   ? Sarcoidosis Sister   ?     died of kidney failure  ? Breast cancer Neg Hx   ? Colon cancer Neg Hx   ? ? ?SOCIAL HX: Non-smoker ? ? ?Current Outpatient Medications:  ?  albuterol (VENTOLIN HFA) 108 (90 Base) MCG/ACT inhaler, Inhale 2 puffs into the lungs every 6 (six) hours as needed for wheezing or shortness of breath., Disp: 1 each, Rfl: 0 ?  amoxicillin-clavulanate (AUGMENTIN) 875-125 MG tablet, Take 1 tablet by mouth 2 (two) times daily., Disp: 14 tablet, Rfl: 0 ?  Cholecalciferol (VITAMIN D3 PO), Take by mouth daily., Disp: , Rfl:  ?  ezetimibe-simvastatin (VYTORIN) 10-40 MG tablet, TAKE 1 TABLET BY MOUTH DAILY., Disp: 90 tablet, Rfl: 1 ?  Multiple Vitamin (MULTIVITAMIN) capsule, Take 1 capsule by mouth daily. AM, Disp: , Rfl:  ?  pantoprazole (PROTONIX) 40 MG tablet, TAKE ONE TABLET BY MOUTH DAILY, Disp: 90 tablet, Rfl: 3 ?  telmisartan (MICARDIS) 40 MG tablet, TAKE 1 TABLET BY MOUTH EVERY MORNING., Disp: 90 tablet, Rfl: 1 ?  vitamin B-12 (CYANOCOBALAMIN) 1000 MCG tablet, Take 1,000 mcg by mouth daily., Disp: , Rfl:  ?  Vitamin E 400 units TABS, Take by mouth daily., Disp: , Rfl:  ? ?EXAM: ? ?VITALS per patient if applicable: ? ?GENERAL: alert, oriented, appears well and in no acute distress ? ?HEENT: atraumatic, conjunttiva clear, no obvious abnormalities on inspection of external nose and ears ? ?NECK: normal movements of the head and neck ? ?LUNGS: on inspection no signs of respiratory distress, breathing rate appears normal, no obvious gross SOB, gasping or wheezing ? ?CV: no obvious cyanosis ? ?MS: moves all visible extremities without noticeable abnormality ? ?PSYCH/NEURO: pleasant and cooperative, no obvious depression or anxiety, speech and thought processing  grossly intact ? ?ASSESSMENT AND PLAN: ? ?Discussed the following assessment and plan: ? ?Problem List Items Addressed This Visit   ? ? Sinusitis  ?  Symptoms are likely sinusitis at this point.  Discussed she may have had COVID or some other viral illness to start with.  At this point there is no point in testing her for COVID.  She will wear a mask when she is around others.  We will proceed with treatment with Augmentin.  She was counseled on the risk of diarrhea with this.  If she is not improving with this antibiotic she will let us know. ? ?  ?  ? Relevant Medications  ? amoxicillin-clavulanate (AUGMENTIN) 875-125 MG tablet  ? ? ?Return if symptoms worsen or fail to improve. ?  ?I discussed the assessment and treatment plan with the patient. The patient was provided an opportunity to ask questions and all were answered. The patient agreed with the plan and demonstrated an  understanding of the instructions. ?  ?The patient was advised to call back or seek an in-person evaluation if the symptoms worsen or if the condition fails to improve as anticipated. ? ?Tommi Rumps, MD  ? ?

## 2021-05-30 NOTE — Assessment & Plan Note (Signed)
Symptoms are likely sinusitis at this point.  Discussed she may have had COVID or some other viral illness to start with.  At this point there is no point in testing her for COVID.  She will wear a mask when she is around others.  We will proceed with treatment with Augmentin.  She was counseled on the risk of diarrhea with this.  If she is not improving with this antibiotic she will let us know. ?

## 2021-06-07 ENCOUNTER — Ambulatory Visit: Payer: Medicare PPO | Admitting: Internal Medicine

## 2021-06-07 ENCOUNTER — Encounter: Payer: Self-pay | Admitting: Internal Medicine

## 2021-06-07 VITALS — BP 122/70 | HR 65 | Temp 97.7°F | Resp 18 | Ht 63.0 in | Wt 185.6 lb

## 2021-06-07 DIAGNOSIS — E1165 Type 2 diabetes mellitus with hyperglycemia: Secondary | ICD-10-CM

## 2021-06-07 DIAGNOSIS — K2270 Barrett's esophagus without dysplasia: Secondary | ICD-10-CM | POA: Diagnosis not present

## 2021-06-07 DIAGNOSIS — I700 Atherosclerosis of aorta: Secondary | ICD-10-CM | POA: Diagnosis not present

## 2021-06-07 DIAGNOSIS — E7800 Pure hypercholesterolemia, unspecified: Secondary | ICD-10-CM | POA: Diagnosis not present

## 2021-06-07 DIAGNOSIS — K449 Diaphragmatic hernia without obstruction or gangrene: Secondary | ICD-10-CM | POA: Diagnosis not present

## 2021-06-07 DIAGNOSIS — M25561 Pain in right knee: Secondary | ICD-10-CM | POA: Diagnosis not present

## 2021-06-07 DIAGNOSIS — K219 Gastro-esophageal reflux disease without esophagitis: Secondary | ICD-10-CM

## 2021-06-07 DIAGNOSIS — I10 Essential (primary) hypertension: Secondary | ICD-10-CM

## 2021-06-07 LAB — HEPATIC FUNCTION PANEL
ALT: 27 U/L (ref 0–35)
AST: 26 U/L (ref 0–37)
Albumin: 4.2 g/dL (ref 3.5–5.2)
Alkaline Phosphatase: 89 U/L (ref 39–117)
Bilirubin, Direct: 0.2 mg/dL (ref 0.0–0.3)
Total Bilirubin: 1 mg/dL (ref 0.2–1.2)
Total Protein: 6.8 g/dL (ref 6.0–8.3)

## 2021-06-07 LAB — BASIC METABOLIC PANEL
BUN: 16 mg/dL (ref 6–23)
CO2: 26 mEq/L (ref 19–32)
Calcium: 9.3 mg/dL (ref 8.4–10.5)
Chloride: 107 mEq/L (ref 96–112)
Creatinine, Ser: 0.9 mg/dL (ref 0.40–1.20)
GFR: 61.38 mL/min (ref >60.00)
Glucose, Bld: 119 mg/dL — ABNORMAL HIGH (ref 70–99)
Potassium: 3.9 mEq/L (ref 3.5–5.1)
Sodium: 140 mEq/L (ref 135–145)

## 2021-06-07 LAB — LIPID PANEL
Cholesterol: 130 mg/dL (ref 0–200)
HDL: 40.8 mg/dL (ref >39.00)
LDL Cholesterol: 67 mg/dL (ref 0–99)
NonHDL: 89.08
Total CHOL/HDL Ratio: 3
Triglycerides: 109 mg/dL (ref 0.0–149.0)
VLDL: 21.8 mg/dL (ref 0.0–40.0)

## 2021-06-07 LAB — CBC WITH DIFFERENTIAL/PLATELET
Basophils Absolute: 0 10*3/uL (ref 0.0–0.1)
Basophils Relative: 0.3 % (ref 0.0–3.0)
Eosinophils Absolute: 0.2 10*3/uL (ref 0.0–0.7)
Eosinophils Relative: 3.8 % (ref 0.0–5.0)
HCT: 42 % (ref 36.0–46.0)
Hemoglobin: 13.9 g/dL (ref 12.0–15.0)
Lymphocytes Relative: 25.8 % (ref 12.0–46.0)
Lymphs Abs: 1.2 10*3/uL (ref 0.7–4.0)
MCHC: 33 g/dL (ref 30.0–36.0)
MCV: 92.8 fl (ref 78.0–100.0)
Monocytes Absolute: 0.3 10*3/uL (ref 0.1–1.0)
Monocytes Relative: 6.6 % (ref 3.0–12.0)
Neutro Abs: 3.1 10*3/uL (ref 1.4–7.7)
Neutrophils Relative %: 63.5 % (ref 43.0–77.0)
Platelets: 230 10*3/uL (ref 150.0–400.0)
RBC: 4.53 Mil/uL (ref 3.87–5.11)
RDW: 13.6 % (ref 11.5–15.5)
WBC: 4.8 10*3/uL (ref 4.0–10.5)

## 2021-06-07 LAB — MICROALBUMIN / CREATININE URINE RATIO
Creatinine,U: 95.8 mg/dL
Microalb Creat Ratio: 0.7 mg/g (ref 0.0–30.0)
Microalb, Ur: <0.7 mg/dL (ref 0.0–1.9)

## 2021-06-07 LAB — HEMOGLOBIN A1C: Hgb A1c MFr Bld: 6 % (ref 4.6–6.5)

## 2021-06-07 NOTE — Progress Notes (Signed)
Patient ID: Kaitlin Dean, female   DOB: 09/14/1945, 76 y.o.   MRN: [id number] ? ? ?Subjective:  ? ? Patient ID: Kaitlin Dean, female    DOB: 26-Mar-1945, 76 y.o.   MRN: [id number] ? ?This visit occurred during the SARS-CoV-2 public health emergency.  Safety protocols were in place, including screening questions prior to the visit, additional usage of staff PPE, and extensive cleaning of exam room while observing appropriate contact time as indicated for disinfecting solutions.  ? ?Patient here for a scheduled follow up.  ? ?Chief Complaint  ?Patient presents with  ? Follow-up  ?  Follow up for HTN  ? Hypertension  ? .  ? ?HPI ?Just recently returned from a 2 week [employer] Cruise ([place]).  Did a lot of walking.  After returned, started having right knee pain.  Increased pain when she first gets up.  Discussed using walker.  She is wearing a brace.  Has an appt with ortho next week.  No chest pain or sob reported.  No abdominal pain or bowel change reported.  Was evaluated by Dr Caryl Bis - diagnosed with sinusitis.  Treated with augmentin.  Symptoms have resolved.  No sinus congestion or drainage.  No chest congestion.  No cough.   ? ? ?Past Medical History:  ?Diagnosis Date  ? Arthritis   ? Knee before replacement  ? Barrett's esophagus   ? Chicken pox   ? Complication of anesthesia   ? woke up 15 years ago and felt like could not breathe - no complications   ? Diverticulosis of colon without hemorrhage 02/28/2014  ? GERD (gastroesophageal reflux disease)   ? History of fractured kneecap   ? History of fractured pelvis   ? History of hiatal hernia   ? Hypercholesterolemia   ? Hypertension   ? Osteoarthritis   ? Osteoporosis   ? Pneumonia   ? in past  ? 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 Shoulder fracture, right 11/07/2018  ? Skin cancer   ? ?Past Surgical History:  ?Procedure Laterality Date  ? BREAST EXCISIONAL BIOPSY Right 2000  ? benign  ? BREAST SURGERY  2000  ? Biopsy  ? CATARACT EXTRACTION W/PHACO  Right 12/05/2016  ? Procedure: CATARACT EXTRACTION PHACO AND INTRAOCULAR LENS PLACEMENT ([place]) RIGHT SYMFONY LENS;  Surgeon: Leandrew Koyanagi, MD;  Location: [place];  Service: Ophthalmology;  Laterality: Right;  ? CATARACT EXTRACTION W/PHACO Left 02/06/2017  ? Procedure: CATARACT EXTRACTION PHACO AND INTRAOCULAR LENS PLACEMENT ([place]) LEFT SYMFONY LENS;  Surgeon: Leandrew Koyanagi, MD;  Location: [place];  Service: Ophthalmology;  Laterality: Left;  ? COLONOSCOPY    ? ESOPHAGOGASTRODUODENOSCOPY    ? ESOPHAGOGASTRODUODENOSCOPY (EGD) WITH PROPOFOL N/A 06/11/2016  ? Procedure: ESOPHAGOGASTRODUODENOSCOPY (EGD) WITH PROPOFOL;  Surgeon: Manya Silvas, MD;  Location: [facility] ENDOSCOPY;  Service: Endoscopy;  Laterality: N/A;  ? ESOPHAGOGASTRODUODENOSCOPY (EGD) WITH PROPOFOL N/A 05/25/2019  ? Procedure: ESOPHAGOGASTRODUODENOSCOPY (EGD) WITH BIOPSY;  Surgeon: Lucilla Lame, MD;  Location: [place];  Service: Endoscopy;  Laterality: N/A;  Priority 3  ? JOINT REPLACEMENT Left   ? knee  ? PTOSIS REPAIR Bilateral 06/01/2014  ? Procedure: PTOSIS REPAIR;  Surgeon: Karle Starch, MD;  Location: [place];  Service: Ophthalmology;  Laterality: Bilateral;  ? SKIN CANCER EXCISION    ? TOTAL KNEE ARTHROPLASTY Left 04/30/2012  ? Procedure: TOTAL KNEE ARTHROPLASTY;  Surgeon: Gearlean Alf, MD;  [facility];  Service: Orthopedics;  Laterality: Left;  ?  TUBAL LIGATION    ? ?Family History  ?Problem Relation Age of Onset  ? Hypertension Mother   ? Congestive Heart Failure Mother   ? Stroke Father   ? Stroke Brother   ? Heart failure Brother   ? Sarcoidosis Sister   ?     died of kidney failure  ? Breast cancer Neg Hx   ? Colon cancer Neg Hx   ? ?Social History  ? ?Socioeconomic History  ? Marital status: Widowed  ?  Spouse name: Not on file  ? Number of children: 2  ? Years of education: Not on file  ? Highest education level: Not on file  ?Occupational History  ?  Comment: Retired Human resources officer  ?Tobacco Use  ? Smoking status: Never  ? Smokeless tobacco: Never  ?Vaping Use  ? Vaping Use: Never used  ?Substance and Sexual Activity  ? Alcohol use: Yes  ?  Alcohol/week: 1.0 standard drink  ?  Types: 1 Glasses of wine per week  ?  Comment: SOCIALLY  ? Drug use: No  ? Sexual activity: Not Currently  ?Other Topics Concern  ? Not on file  ?Social History Narrative  ? 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 Retired Diplomatic Services operational officer in the [employer]  ? 1 son and 1 daughter, 1 son is a Social research officer, government working for [employer] and the daughter lives in [place]  ? Rare alcohol non-smoker  ? ?Social Determinants of Health  ? ?Financial Resource Strain: Low Risk   ? Difficulty of Paying Living Expenses: Not hard at all  ?Food Insecurity: No Food Insecurity  ? Worried About Charity fundraiser in the Last Year: Never true  ? Ran Out of Food in the Last Year: Never true  ?Transportation Needs: No Transportation Needs  ? Lack of Transportation (Medical): No  ? Lack of Transportation (Non-Medical): No  ?Physical Activity: Sufficiently Active  ? Days of Exercise per Week: 4 days  ? Minutes of Exercise per Session: 50 min  ?Stress: No Stress Concern Present  ? Feeling of Stress : Not at all  ?Social Connections: Unknown  ? Frequency of Communication with Friends and Family: More than three times a week  ? Frequency of Social Gatherings with Friends and Family: More than three times a week  ? Attends Religious Services: More than 4 times per year  ? Active Member of Clubs or Organizations: Yes  ? Attends Archivist Meetings: More than 4 times per year  ? Marital Status: Not on file  ? ? ? ?Review of Systems  ?Constitutional:  Negative for appetite change and unexpected weight change.  ?HENT:  Negative for congestion and sinus pressure.   ?Respiratory:  Negative for cough, chest tightness and shortness of breath.   ?Cardiovascular:  Negative for chest pain,  palpitations and leg swelling.  ?Gastrointestinal:  Negative for abdominal pain, diarrhea, nausea and vomiting.  ?Genitourinary:  Negative for difficulty urinating and dysuria.  ?Musculoskeletal:  Negative for myalgias.  ?     Right knee pain as outlined.   ?Skin:  Negative for color change and rash.  ?Neurological:  Negative for dizziness, light-headedness and headaches.  ?Psychiatric/Behavioral:  Negative for agitation and dysphoric mood.   ? ?   ?Objective:  ?  ? ?BP 122/70 (BP Location: Left Arm, Patient Position: Sitting, Cuff Size: Small)   Pulse 65   Temp 97.7 ?F (36.5 ?C) (Temporal)  Resp 18   Ht '5\' 3"'$  (1.6 m)   Wt 185 lb 9.6 oz (84.2 kg)   LMP 01/29/1993   SpO2 98%   BMI 32.88 kg/m?  ?Wt Readings from Last 3 Encounters:  ?06/07/21 185 lb 9.6 oz (84.2 kg)  ?05/30/21 181 lb (82.1 kg)  ?02/27/21 181 lb (82.1 kg)  ? ? ?Physical Exam ?Vitals reviewed.  ?Constitutional:   ?   General: She is not in acute distress. ?   Appearance: Normal appearance.  ?HENT:  ?   Head: Normocephalic and atraumatic.  ?   Right Ear: External ear normal.  ?   Left Ear: External ear normal.  ?Eyes:  ?   General: No scleral icterus.    ?   Right eye: No discharge.     ?   Left eye: No discharge.  ?   Conjunctiva/sclera: Conjunctivae normal.  ?Neck:  ?   Thyroid: No thyromegaly.  ?Cardiovascular:  ?   Rate and Rhythm: Normal rate and regular rhythm.  ?Pulmonary:  ?   Effort: No respiratory distress.  ?   Breath sounds: Normal breath sounds. No wheezing.  ?Abdominal:  ?   General: Bowel sounds are normal.  ?   Palpations: Abdomen is soft.  ?   Tenderness: There is no abdominal tenderness.  ?Musculoskeletal:     ?   General: No swelling or tenderness.  ?   Cervical back: Neck supple. No tenderness.  ?Lymphadenopathy:  ?   Cervical: No cervical adenopathy.  ?Skin: ?   Findings: No erythema or rash.  ?Neurological:  ?   Mental Status: She is alert.  ?Psychiatric:     ?   Mood and Affect: Mood normal.     ?   Behavior: Behavior  normal.  ? ? ? ?Outpatient Encounter Medications as of 06/07/2021  ?Medication Sig  ? albuterol (VENTOLIN HFA) 108 (90 Base) MCG/ACT inhaler Inhale 2 puffs into the lungs every 6 (six) hours as needed for wheezing or shortnes

## 2021-06-08 ENCOUNTER — Encounter: Payer: Self-pay | Admitting: Internal Medicine

## 2021-06-08 DIAGNOSIS — M25561 Pain in right knee: Secondary | ICD-10-CM | POA: Insufficient documentation

## 2021-06-08 NOTE — Assessment & Plan Note (Signed)
Low carb diet and exercise - given increased sugar.  Check met b and a1c.  

## 2021-06-08 NOTE — Assessment & Plan Note (Signed)
Right knee pain.  Did a lot of walking recently as outlined. Wearing a brace.  Planning f/u with ortho next week.  Discussed using walker to help with gait/balance.  Follow.  ?

## 2021-06-08 NOTE — Assessment & Plan Note (Signed)
EGD 04/2019.  Followed by GI - Dr Wohl. On protonix.  Follow.  

## 2021-06-08 NOTE — Assessment & Plan Note (Signed)
Continue micardis.  Blood pressure doing well.  Follow pressures.  Follow metabolic panel.   

## 2021-06-08 NOTE — Assessment & Plan Note (Signed)
Continue vytorin.   

## 2021-06-08 NOTE — Assessment & Plan Note (Signed)
No upper symptoms reported.  Continue protonix.  

## 2021-06-08 NOTE — Assessment & Plan Note (Signed)
Continue vytorin.  Low cholesterol diet and exercise. Follow lipid panel and liver function tests.   

## 2021-06-20 DIAGNOSIS — M1711 Unilateral primary osteoarthritis, right knee: Secondary | ICD-10-CM | POA: Diagnosis not present

## 2021-06-22 ENCOUNTER — Other Ambulatory Visit: Payer: Self-pay | Admitting: Internal Medicine

## 2021-07-10 ENCOUNTER — Other Ambulatory Visit: Payer: Self-pay | Admitting: Internal Medicine

## 2021-10-13 ENCOUNTER — Other Ambulatory Visit: Payer: Self-pay | Admitting: Internal Medicine

## 2021-10-17 ENCOUNTER — Ambulatory Visit (INDEPENDENT_AMBULATORY_CARE_PROVIDER_SITE_OTHER): Payer: Medicare PPO | Admitting: Internal Medicine

## 2021-10-17 DIAGNOSIS — Z0000 Encounter for general adult medical examination without abnormal findings: Secondary | ICD-10-CM

## 2021-10-17 DIAGNOSIS — E1165 Type 2 diabetes mellitus with hyperglycemia: Secondary | ICD-10-CM

## 2021-10-17 DIAGNOSIS — I10 Essential (primary) hypertension: Secondary | ICD-10-CM

## 2021-10-17 DIAGNOSIS — E7800 Pure hypercholesterolemia, unspecified: Secondary | ICD-10-CM

## 2021-10-17 NOTE — Progress Notes (Deleted)
Patient ID: Kaitlin Dean, female   DOB: September 07, 1945, 76 y.o.   MRN: [id number]   Subjective:    Patient ID: Kaitlin Dean, female    DOB: [birth year], 76 y.o.   MRN: [id number]   Patient here for No chief complaint on file.  Mar[place] Kitchen   HPI Here for her physical exam.  Recently evaluated for right knee pain.  Saw ortho - right knee OA.      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)    Shoulder fracture, right 11/07/2018   Skin cancer    Past Surgical History:  Procedure Laterality Date   BREAST EXCISIONAL BIOPSY Right 2000   benign   BREAST SURGERY  2000   Biopsy   CATARACT EXTRACTION W/PHACO Right 12/05/2016   Procedure: CATARACT EXTRACTION PHACO AND INTRAOCULAR LENS PLACEMENT ([place]) RIGHT SYMFONY LENS;  Surgeon: Leandrew Koyanagi, MD;  Location: [place];  Service: Ophthalmology;  Laterality: Right;   CATARACT EXTRACTION W/PHACO Left 02/06/2017   Procedure: CATARACT EXTRACTION PHACO AND INTRAOCULAR LENS PLACEMENT ([place]) LEFT SYMFONY LENS;  Surgeon: Leandrew Koyanagi, MD;  Location: [facility];  Service: Ophthalmology;  Laterality: Left;   COLONOSCOPY     ESOPHAGOGASTRODUODENOSCOPY     ESOPHAGOGASTRODUODENOSCOPY (EGD) WITH PROPOFOL N/A 06/11/2016   Procedure: ESOPHAGOGASTRODUODENOSCOPY (EGD) WITH PROPOFOL;  Surgeon: Manya Silvas, MD;  [facility];  Service: Endoscopy;  Laterality: N/A;   ESOPHAGOGASTRODUODENOSCOPY (EGD) WITH PROPOFOL  N/A 05/25/2019   Procedure: ESOPHAGOGASTRODUODENOSCOPY (EGD) WITH BIOPSY;  Surgeon: Lucilla Lame, MD;  Location: [place];  Service: Endoscopy;  Laterality: N/A;  Priority 3   JOINT REPLACEMENT Left    knee   PTOSIS REPAIR Bilateral 06/01/2014   Procedure: PTOSIS REPAIR;  Surgeon: Karle Starch, MD;  Location: [place];  Service: Ophthalmology;  Laterality: Bilateral;   SKIN CANCER EXCISION     TOTAL KNEE ARTHROPLASTY Left 04/30/2012   Procedure: TOTAL KNEE ARTHROPLASTY;  Surgeon: Gearlean Alf, MD;  [facility];  Service: Orthopedics;  Laterality: Left;   TUBAL LIGATION     Family History  Problem Relation Age of Onset   Hypertension Mother    Congestive Heart Failure Mother    Stroke Father    Stroke Brother    Heart failure Brother    Sarcoidosis Sister        died of kidney failure   Breast cancer Neg Hx    Colon cancer Neg Hx    Social History   Socioeconomic History   Marital status: Widowed    Spouse name: Not on file   Number of children: 2   Years of education: Not on file   Highest education level: Not on file  Occupational History    Comment: Retired Statistician  Tobacco Use   Smoking status: Never   Smokeless tobacco: Never  Vaping Use  Vaping Use: Never used  Substance and Sexual Activity   Alcohol use: Yes    Alcohol/week: 1.0 standard drink of alcohol    Types: 1 Glasses of wine per week    Comment: SOCIALLY   Drug use: No   Sexual activity: Not Currently  Other Topics Concern   Not on file  Social History Narrative   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[employer]   Retired Diplomatic Services operational officer in the [employer]   1 son and 1 daughter, 1 son is a Social research officer, government working for [employer] and the daughter lives in [place]   Rare alcohol non-smoker   Social Determinants of Radio broadcast assistant Strain: [place]  (02/27/2021)   Overall Financial Resource Strain (CARDIA)    Difficulty  of Paying Living Expenses: Not hard at all  Food Insecurity: No [place] (02/27/2021)   Hunger Vital Sign    Worried About Running Out of Food in the Last Year: Never true    [place] in the Last Year: Never true  Transportation Needs: No Transportation Needs (02/27/2021)   PRAPARE - Hydrologist (Medical): No    Lack of Transportation (Non-Medical): No  Physical Activity: Sufficiently Active (02/27/2021)   Exercise Vital Sign    Days of Exercise per Week: 4 days    Minutes of Exercise per Session: 50 min  Stress: No Stress Concern Present (02/27/2021)   [place]    Feeling of Stress : Not at all  Social Connections: Unknown (02/27/2021)   Social Connection and Isolation Panel [NHANES]    Frequency of Communication with Friends and Family: More than three times a week    Frequency of Social Gatherings with Friends and Family: More than three times a week    Attends Religious Services: More than 4 times per year    Active Member of [employer] or Organizations: Yes    Attends Archivist Meetings: More than 4 times per year    Marital Status: Not on file     Review of Systems     Objective:     LMP 01/29/1993  Wt Readings from Last 3 Encounters:  06/07/21 185 lb 9.6 oz (84.2 kg)  05/30/21 181 lb (82.1 kg)  02/27/21 181 lb (82.1 kg)    Physical Exam   Outpatient Encounter Medications as of 10/17/2021  Medication Sig   albuterol (VENTOLIN HFA) 108 (90 Base) MCG/ACT inhaler Inhale 2 puffs into the lungs every 6 (six) hours as needed for wheezing or shortness of breath.   Cholecalciferol (VITAMIN D3 PO) Take by mouth daily.   ezetimibe-simvastatin (VYTORIN) 10-40 MG tablet TAKE 1 TABLET BY MOUTH DAILY.   Multiple Vitamin (MULTIVITAMIN) capsule Take 1 capsule by mouth daily. AM   pantoprazole (PROTONIX) 40 MG tablet TAKE ONE TABLET BY MOUTH DAILY   telmisartan  (MICARDIS) 40 MG tablet TAKE 1 TABLET BY MOUTH EVERY MORNING.   vitamin B-12 (CYANOCOBALAMIN) 1000 MCG tablet Take 1,000 mcg by mouth daily.   Vitamin E 400 units TABS Take by mouth daily.   No facility-administered encounter medications on file as of 10/17/2021.     Lab Results  Component Value Date   WBC 4.8 06/07/2021   HGB 13.9 06/07/2021   HCT 42.0 06/07/2021   PLT 230.0 06/07/2021   GLUCOSE 119 (H) 06/07/2021   CHOL 130 06/07/2021   TRIG 109.0  06/07/2021   HDL 40.80 06/07/2021   LDLDIRECT 182.4 02/05/2012   LDLCALC 67 06/07/2021   ALT 27 06/07/2021   AST 26 06/07/2021   NA 140 06/07/2021   K 3.9 06/07/2021   CL 107 06/07/2021   CREATININE 0.90 06/07/2021   BUN 16 06/07/2021   CO2 26 06/07/2021   TSH 4.05 10/05/2020   INR 0.99 04/22/2012   HGBA1C 6.0 06/07/2021   MICROALBUR <0.7 06/07/2021    MM 3D SCREEN BREAST BILATERAL  Result Date: 11/01/2020 CLINICAL DATA:  Screening. EXAM: DIGITAL SCREENING BILATERAL MAMMOGRAM WITH TOMOSYNTHESIS AND CAD TECHNIQUE: Bilateral screening digital craniocaudal and mediolateral oblique mammograms were obtained. Bilateral screening digital breast tomosynthesis was performed. The images were evaluated with computer-aided detection. COMPARISON:  Previous exam(s). ACR Breast Density Category b: There are scattered areas of fibroglandular density. FINDINGS: There are no findings suspicious for malignancy. IMPRESSION: No mammographic evidence of malignancy. A result letter of this screening mammogram will be mailed directly to the patient. RECOMMENDATION: Screening mammogram in one year. (Code:SM-B-01Y) BI-RADS CATEGORY  1: Negative. Electronically Signed   By: Franki Cabot M.D.   On: 11/01/2020 09:51      Assessment & Plan:   Problem List Items Addressed This Visit     Hypercholesterolemia   Hypertension   Type 2 diabetes mellitus with hyperglycemia (HCC)     Einar Pheasant, MD

## 2021-10-17 NOTE — Assessment & Plan Note (Signed)
Physical today 10/17/21.  PAP 10/05/20 - negative with negative HPV.  Mammogram 11/01/20 - Birads I.  Colonoscopy 10/2013 - diverticulosis and internal hemorrhoids.  Recommended f/u in 10 years.  States was told no f/u necessary.

## 2021-10-22 ENCOUNTER — Encounter: Payer: Self-pay | Admitting: Internal Medicine

## 2021-10-22 NOTE — Progress Notes (Signed)
Patient ID: Kaitlin Dean, female   DOB: 1945-05-04, 76 y.o.   MRN: [id number] Pt did not show for appt.

## 2021-11-28 ENCOUNTER — Encounter: Payer: Self-pay | Admitting: Internal Medicine

## 2021-11-28 ENCOUNTER — Ambulatory Visit (INDEPENDENT_AMBULATORY_CARE_PROVIDER_SITE_OTHER): Payer: Medicare PPO | Admitting: Internal Medicine

## 2021-11-28 VITALS — BP 120/74 | HR 73 | Temp 97.8°F | Resp 15 | Ht 63.0 in | Wt 188.4 lb

## 2021-11-28 DIAGNOSIS — R739 Hyperglycemia, unspecified: Secondary | ICD-10-CM

## 2021-11-28 DIAGNOSIS — K2270 Barrett's esophagus without dysplasia: Secondary | ICD-10-CM

## 2021-11-28 DIAGNOSIS — Z1231 Encounter for screening mammogram for malignant neoplasm of breast: Secondary | ICD-10-CM

## 2021-11-28 DIAGNOSIS — Z0000 Encounter for general adult medical examination without abnormal findings: Secondary | ICD-10-CM | POA: Diagnosis not present

## 2021-11-28 DIAGNOSIS — M179 Osteoarthritis of knee, unspecified: Secondary | ICD-10-CM

## 2021-11-28 DIAGNOSIS — I700 Atherosclerosis of aorta: Secondary | ICD-10-CM

## 2021-11-28 DIAGNOSIS — K219 Gastro-esophageal reflux disease without esophagitis: Secondary | ICD-10-CM

## 2021-11-28 DIAGNOSIS — I499 Cardiac arrhythmia, unspecified: Secondary | ICD-10-CM | POA: Diagnosis not present

## 2021-11-28 DIAGNOSIS — E7800 Pure hypercholesterolemia, unspecified: Secondary | ICD-10-CM

## 2021-11-28 DIAGNOSIS — E1165 Type 2 diabetes mellitus with hyperglycemia: Secondary | ICD-10-CM | POA: Diagnosis not present

## 2021-11-28 DIAGNOSIS — Z8619 Personal history of other infectious and parasitic diseases: Secondary | ICD-10-CM

## 2021-11-28 DIAGNOSIS — Z23 Encounter for immunization: Secondary | ICD-10-CM

## 2021-11-28 DIAGNOSIS — I10 Essential (primary) hypertension: Secondary | ICD-10-CM | POA: Diagnosis not present

## 2021-11-28 DIAGNOSIS — K449 Diaphragmatic hernia without obstruction or gangrene: Secondary | ICD-10-CM

## 2021-11-28 LAB — HEMOGLOBIN A1C: Hgb A1c MFr Bld: 6.3 % (ref 4.6–6.5)

## 2021-11-28 LAB — LIPID PANEL
Cholesterol: 132 mg/dL (ref 0–200)
HDL: 39 mg/dL — ABNORMAL LOW (ref >39.00)
LDL Cholesterol: 70 mg/dL (ref 0–99)
NonHDL: 92.9
Total CHOL/HDL Ratio: 3
Triglycerides: 116 mg/dL (ref 0.0–149.0)
VLDL: 23.2 mg/dL (ref 0.0–40.0)

## 2021-11-28 LAB — HEPATIC FUNCTION PANEL
ALT: 35 U/L (ref 0–35)
AST: 27 U/L (ref 0–37)
Albumin: 4.5 g/dL (ref 3.5–5.2)
Alkaline Phosphatase: 91 U/L (ref 39–117)
Bilirubin, Direct: 0.2 mg/dL (ref 0.0–0.3)
Total Bilirubin: 1.1 mg/dL (ref 0.2–1.2)
Total Protein: 6.9 g/dL (ref 6.0–8.3)

## 2021-11-28 LAB — BASIC METABOLIC PANEL
BUN: 23 mg/dL (ref 6–23)
CO2: 26 mEq/L (ref 19–32)
Calcium: 9.6 mg/dL (ref 8.4–10.5)
Chloride: 103 mEq/L (ref 96–112)
Creatinine, Ser: 0.86 mg/dL (ref 0.40–1.20)
GFR: 64.6 mL/min (ref >60.00)
Glucose, Bld: 117 mg/dL — ABNORMAL HIGH (ref 70–99)
Potassium: 4 mEq/L (ref 3.5–5.1)
Sodium: 138 mEq/L (ref 135–145)

## 2021-11-28 LAB — TSH: TSH: 3.22 u[IU]/mL (ref 0.35–5.50)

## 2021-11-28 MED ORDER — TELMISARTAN 40 MG PO TABS: 40.0000 mg | ORAL_TABLET | Freq: Every morning | ORAL | 1 refills | Status: DC

## 2021-11-28 NOTE — Progress Notes (Signed)
Patient ID: Kaitlin Dean, female   DOB: 1945-05-09, 76 y.o.   MRN: [id number]   Subjective:    Patient ID: Kaitlin Dean, female    DOB: 1945-11-16, 76 y.o.   MRN: [id number]   Patient here for  Chief Complaint  Patient presents with   Annual Exam    CPE   .   HPI Here for physical.  Recent knee pain (right) - OA. Saw ortho 05/2021. Started water aerobics and silver sneakers - knee pain has resolved.  Has decreased carbs.  No chest pain.  Breathing stable.  Noticed on her watch - alarmed -- afib on two occasions.  Has a history of irregular heart beat.  No abdominal pain or bowel change.    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)    Shoulder fracture, right 11/07/2018   Skin cancer    Past Surgical History:  Procedure Laterality Date   BREAST EXCISIONAL BIOPSY Right 2000   benign   BREAST SURGERY  2000   Biopsy   CATARACT EXTRACTION W/PHACO Right 12/05/2016   Procedure: CATARACT EXTRACTION PHACO AND INTRAOCULAR LENS PLACEMENT ([place]) RIGHT SYMFONY LENS;  Surgeon: Leandrew Koyanagi, MD;  Location: [place];  Service: Ophthalmology;  Laterality: Right;   CATARACT EXTRACTION W/PHACO Left 02/06/2017   Procedure: CATARACT EXTRACTION PHACO AND INTRAOCULAR LENS PLACEMENT ([place]) LEFT SYMFONY LENS;  Surgeon: Leandrew Koyanagi, MD;  Location: [place];  Service: Ophthalmology;  Laterality: Left;   COLONOSCOPY     ESOPHAGOGASTRODUODENOSCOPY      ESOPHAGOGASTRODUODENOSCOPY (EGD) WITH PROPOFOL N/A 06/11/2016   Procedure: ESOPHAGOGASTRODUODENOSCOPY (EGD) WITH PROPOFOL;  Surgeon: Manya Silvas, MD;  Location: [facility];  Service: Endoscopy;  Laterality: N/A;   ESOPHAGOGASTRODUODENOSCOPY (EGD) WITH PROPOFOL N/A 05/25/2019   Procedure: ESOPHAGOGASTRODUODENOSCOPY (EGD) WITH BIOPSY;  Surgeon: Lucilla Lame, MD;  Location: [place];  Service: Endoscopy;  Laterality: N/A;  Priority 3   JOINT REPLACEMENT Left    knee   PTOSIS REPAIR Bilateral 06/01/2014   Procedure: PTOSIS REPAIR;  Surgeon: Karle Starch, MD;  Location: [place];  Service: Ophthalmology;  Laterality: Bilateral;   SKIN CANCER EXCISION     TOTAL KNEE ARTHROPLASTY Left 04/30/2012   Procedure: TOTAL KNEE ARTHROPLASTY;  Surgeon: Gearlean Alf, MD;  Location: [facility];  Service: Orthopedics;  Laterality: Left;   TUBAL LIGATION     Family History  Problem Relation Age of Onset   Hypertension Mother    Congestive Heart Failure Mother    Stroke Father    Stroke Brother    Heart failure Brother    Sarcoidosis Sister        died of kidney failure   Breast cancer Neg Hx    Colon cancer Neg Hx    Social History   Socioeconomic History   Marital status: Widowed    Spouse name:  Not on file   Number of children: 2   Years of education: Not on file   Highest education level: Not on file  Occupational History    Comment: Retired Statistician  Tobacco Use   Smoking status: Never   Smokeless tobacco: Never  Vaping Use   Vaping Use: Never used  Substance and Sexual Activity   Alcohol use: Yes    Alcohol/week: 1.0 standard drink of alcohol    Types: 1 Glasses of wine per week    Comment: SOCIALLY   Drug use: No   Sexual activity: Not Currently  Other Topics Concern   Not on file  Social History Narrative   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[employer]   Retired Diplomatic Services operational officer in the [employer]   1 son  and 1 daughter, 1 son is a Social research officer, government working for [employer] and the daughter lives in [place]   Rare alcohol non-smoker   Social Determinants of Radio broadcast assistant Strain: [place]  (02/27/2021)   Overall Financial Resource Strain (CARDIA)    Difficulty of Paying Living Expenses: Not hard at all  Food Insecurity: No [place] (02/27/2021)   Hunger Vital Sign    Worried About Running Out of Food in the Last Year: Never true    [place] in the Last Year: Never true  Transportation Needs: No Transportation Needs (02/27/2021)   PRAPARE - Hydrologist (Medical): No    Lack of Transportation (Non-Medical): No  Physical Activity: Sufficiently Active (02/27/2021)   Exercise Vital Sign    Days of Exercise per Week: 4 days    Minutes of Exercise per Session: 50 min  Stress: No Stress Concern Present (02/27/2021)   [place]    Feeling of Stress : Not at all  Social Connections: Unknown (02/27/2021)   Social Connection and Isolation Panel [NHANES]    Frequency of Communication with Friends and Family: More than three times a week    Frequency of Social Gatherings with Friends and Family: More than three times a week    Attends Religious Services: More than 4 times per year    Active Member of [employer] or Organizations: Yes    Attends Music therapist: More than 4 times per year    Marital Status: Not on file     Review of Systems  Constitutional:  Negative for appetite change and unexpected weight change.  HENT:  Negative for congestion, sinus pressure and sore throat.   Eyes:  Negative for pain and visual disturbance.  Respiratory:  Negative for cough, chest tightness and shortness of breath.   Cardiovascular:  Negative for chest pain, palpitations and leg swelling.  Gastrointestinal:  Negative for abdominal pain, diarrhea, nausea and vomiting.  Genitourinary:   Negative for difficulty urinating and dysuria.  Musculoskeletal:  Negative for joint swelling and myalgias.  Skin:  Negative for color change and rash.  Neurological:  Negative for dizziness, light-headedness and headaches.  Hematological:  Negative for adenopathy. Does not bruise/bleed easily.  Psychiatric/Behavioral:  Negative for agitation and dysphoric mood.        Objective:     BP 120/74 (BP Location: Left Arm, Patient Position: Sitting, Cuff Size: Small)   Pulse 73   Temp 97.8 F (36.6 C) (Temporal)   Resp 15   Ht _0  (1.6 m)   Wt 188  lb 6.4 oz (85.5 kg)   LMP 01/29/1993   SpO2 98%   BMI 33.37 kg/m  Wt Readings from Last 3 Encounters:  11/28/21 188 lb 6.4 oz (85.5 kg)  06/07/21 185 lb 9.6 oz (84.2 kg)  05/30/21 181 lb (82.1 kg)    Physical Exam Vitals reviewed.  Constitutional:      General: She is not in acute distress.    Appearance: Normal appearance. She is well-developed.  HENT:     Head: Normocephalic and atraumatic.     Right Ear: External ear normal.     Left Ear: External ear normal.  Eyes:     General: No scleral icterus.       Right eye: No discharge.        Left eye: No discharge.     Conjunctiva/sclera: Conjunctivae normal.  Neck:     Thyroid: No thyromegaly.  Cardiovascular:     Rate and Rhythm: Normal rate and regular rhythm.  Pulmonary:     Effort: No tachypnea, accessory muscle usage or respiratory distress.     Breath sounds: Normal breath sounds. No decreased breath sounds or wheezing.  Chest:  Breasts:    Right: No inverted nipple, mass, nipple discharge or tenderness (no axillary adenopathy).     Left: No inverted nipple, mass, nipple discharge or tenderness (no axilarry adenopathy).  Abdominal:     General: Bowel sounds are normal.     Palpations: Abdomen is soft.     Tenderness: There is no abdominal tenderness.  Musculoskeletal:        General: No swelling or tenderness.     Cervical back: Neck supple.  Lymphadenopathy:      Cervical: No cervical adenopathy.  Skin:    Findings: No erythema or rash.  Neurological:     Mental Status: She is alert and oriented to person, place, and time.  Psychiatric:        Mood and Affect: Mood normal.        Behavior: Behavior normal.      Outpatient Encounter Medications as of 11/28/2021  Medication Sig   albuterol (VENTOLIN HFA) 108 (90 Base) MCG/ACT inhaler Inhale 2 puffs into the lungs every 6 (six) hours as needed for wheezing or shortness of breath.   Cholecalciferol (VITAMIN D3 PO) Take by mouth daily.   ezetimibe-simvastatin (VYTORIN) 10-40 MG tablet TAKE 1 TABLET BY MOUTH DAILY.   Multiple Vitamin (MULTIVITAMIN) capsule Take 1 capsule by mouth daily. AM   pantoprazole (PROTONIX) 40 MG tablet TAKE ONE TABLET BY MOUTH DAILY   vitamin B-12 (CYANOCOBALAMIN) 1000 MCG tablet Take 1,000 mcg by mouth daily.   Vitamin E 400 units TABS Take by mouth daily.   [DISCONTINUED] telmisartan (MICARDIS) 40 MG tablet TAKE 1 TABLET BY MOUTH EVERY MORNING.   telmisartan (MICARDIS) 40 MG tablet Take 1 tablet (40 mg total) by mouth every morning.   No facility-administered encounter medications on file as of 11/28/2021.     Lab Results  Component Value Date   WBC 4.8 06/07/2021   HGB 13.9 06/07/2021   HCT 42.0 06/07/2021   PLT 230.0 06/07/2021   GLUCOSE 117 (H) 11/28/2021   CHOL 132 11/28/2021   TRIG 116.0 11/28/2021   HDL 39.00 (L) 11/28/2021   LDLDIRECT 182.4 02/05/2012   LDLCALC 70 11/28/2021   ALT 35 11/28/2021   AST 27 11/28/2021   NA 138 11/28/2021   K 4.0 11/28/2021   CL 103 11/28/2021   CREATININE 0.86 11/28/2021   BUN 23  11/28/2021   CO2 26 11/28/2021   TSH 3.22 11/28/2021   INR 0.99 04/22/2012   HGBA1C 6.3 11/28/2021   MICROALBUR <0.7 06/07/2021    MM 3D SCREEN BREAST BILATERAL  Result Date: 11/01/2020 CLINICAL DATA:  Screening. EXAM: DIGITAL SCREENING BILATERAL MAMMOGRAM WITH TOMOSYNTHESIS AND CAD TECHNIQUE: Bilateral screening digital craniocaudal  and mediolateral oblique mammograms were obtained. Bilateral screening digital breast tomosynthesis was performed. The images were evaluated with computer-aided detection. COMPARISON:  Previous exam(s). ACR Breast Density Category b: There are scattered areas of fibroglandular density. FINDINGS: There are no findings suspicious for malignancy. IMPRESSION: No mammographic evidence of malignancy. A result letter of this screening mammogram will be mailed directly to the patient. RECOMMENDATION: Screening mammogram in one year. (Code:SM-B-01Y) BI-RADS CATEGORY  1: Negative. Electronically Signed   By: Franki Cabot M.D.   On: 11/01/2020 09:51      Assessment & Plan:   Problem List Items Addressed This Visit     Aortic atherosclerosis ([place])    Continue vytorin.        Relevant Medications   telmisartan (MICARDIS) 40 MG tablet   Barrett's esophagus    EGD 04/2019.  Followed by GI - Dr Allen Norris. On protonix.  Follow.       Health care maintenance    Physical today 11/28/21.  PAP 10/05/20 - negative with negative HPV.  Mammogram 11/01/20 - Birads I.  colonoscopy 10/2013.  Recommended f/u in 10 years. She will call and schedule mammogram.       Hiatal hernia with GERD    No upper symptoms reported.  Continue protonix.       History of HPV infection    PAP 09/2020 - negative with negative HPV.       Hypercholesterolemia    Continue vytorin.  Low cholesterol diet and exercise. Follow lipid panel and liver function tests.        Relevant Medications   telmisartan (MICARDIS) 40 MG tablet   Hyperglycemia    Low carb diet and exercise.  Follow met b and a1c.       Hypertension    Continue micardis.  Blood pressure doing well.  Follow pressures.  Follow metabolic panel.        Relevant Medications   telmisartan (MICARDIS) 40 MG tablet   Irregular heart rhythm    Her watch alarmed afib on two separate occasions.  Discussed further evaluation - possible monitor, echo, etc.  Desires referral to  cardiology for further evaluation.        Relevant Orders   EKG 12-Lead (Completed)   Ambulatory referral to Cardiology   OA (osteoarthritis) of knee    Pain resolved with exercise.       Type 2 diabetes mellitus with hyperglycemia (HCC)    Low carb diet and exercise. Has adjusted diet.  Decreased carb intake and sweets.   Check met b and a1c.       Relevant Medications   telmisartan (MICARDIS) 40 MG tablet   Other Visit Diagnoses     Routine general medical examination at a health care facility    -  Primary   Encounter for screening mammogram for malignant neoplasm of breast       Relevant Orders   MM 3D SCREEN BREAST BILATERAL   Need for influenza vaccination       Relevant Orders   Flu Vaccine QUAD High Dose(Fluad) (Completed)        Einar Pheasant, MD

## 2021-11-28 NOTE — Assessment & Plan Note (Addendum)
Physical today 11/28/21.  PAP 10/05/20 - negative with negative HPV.  Mammogram 11/01/20 - Birads I.  colonoscopy 10/2013.  Recommended f/u in 10 years. She will call and schedule mammogram.

## 2021-12-03 ENCOUNTER — Telehealth: Payer: Self-pay | Admitting: Internal Medicine

## 2021-12-03 ENCOUNTER — Encounter: Payer: Self-pay | Admitting: Internal Medicine

## 2021-12-03 NOTE — Assessment & Plan Note (Signed)
No upper symptoms reported.  Continue protonix.  

## 2021-12-03 NOTE — Assessment & Plan Note (Signed)
Low carb diet and exercise. Has adjusted diet.  Decreased carb intake and sweets.   Check met b and a1c.

## 2021-12-03 NOTE — Telephone Encounter (Signed)
I received notification that she was overdue mammogram.  It also appears she missed mammogram scheduled for 11/28/21.  Needs to reschedule.

## 2021-12-03 NOTE — Assessment & Plan Note (Signed)
Continue vytorin.  Low cholesterol diet and exercise. Follow lipid panel and liver function tests.   

## 2021-12-03 NOTE — Assessment & Plan Note (Signed)
PAP 09/2020 - negative with negative HPV.  

## 2021-12-03 NOTE — Assessment & Plan Note (Signed)
Continue vytorin.   

## 2021-12-03 NOTE — Assessment & Plan Note (Signed)
Low carb diet and exercise.  Follow met b and a1c.

## 2021-12-03 NOTE — Assessment & Plan Note (Signed)
Pain resolved with exercise.

## 2021-12-03 NOTE — Assessment & Plan Note (Signed)
EGD 04/2019.  Followed by GI - Dr Wohl. On protonix.  Follow.  

## 2021-12-03 NOTE — Assessment & Plan Note (Signed)
Her watch alarmed afib on two separate occasions.  Discussed further evaluation - possible monitor, echo, etc.  Desires referral to cardiology for further evaluation.

## 2021-12-03 NOTE — Assessment & Plan Note (Signed)
Continue micardis.  Blood pressure doing well.  Follow pressures.  Follow metabolic panel.   

## 2021-12-04 NOTE — Telephone Encounter (Signed)
Mammo has been rescheduled to 11/7

## 2021-12-05 ENCOUNTER — Ambulatory Visit
Admission: RE | Admit: 2021-12-05 | Discharge: 2021-12-05 | Disposition: A | Discharge status: Home / Self Care | Payer: Medicare PPO | Source: Ambulatory Visit | Attending: Internal Medicine | Admitting: Internal Medicine

## 2021-12-05 DIAGNOSIS — Z1231 Encounter for screening mammogram for malignant neoplasm of breast: Secondary | ICD-10-CM | POA: Insufficient documentation

## 2021-12-20 DIAGNOSIS — D225 Melanocytic nevi of trunk: Secondary | ICD-10-CM | POA: Diagnosis not present

## 2021-12-20 DIAGNOSIS — D2272 Melanocytic nevi of left lower limb, including hip: Secondary | ICD-10-CM | POA: Diagnosis not present

## 2021-12-20 DIAGNOSIS — D2262 Melanocytic nevi of left upper limb, including shoulder: Secondary | ICD-10-CM | POA: Diagnosis not present

## 2021-12-20 DIAGNOSIS — D485 Neoplasm of uncertain behavior of skin: Secondary | ICD-10-CM | POA: Diagnosis not present

## 2021-12-20 DIAGNOSIS — D2271 Melanocytic nevi of right lower limb, including hip: Secondary | ICD-10-CM | POA: Diagnosis not present

## 2021-12-20 DIAGNOSIS — L821 Other seborrheic keratosis: Secondary | ICD-10-CM | POA: Diagnosis not present

## 2021-12-20 DIAGNOSIS — L570 Actinic keratosis: Secondary | ICD-10-CM | POA: Diagnosis not present

## 2021-12-20 DIAGNOSIS — D044 Carcinoma in situ of skin of scalp and neck: Secondary | ICD-10-CM | POA: Diagnosis not present

## 2021-12-20 DIAGNOSIS — D2261 Melanocytic nevi of right upper limb, including shoulder: Secondary | ICD-10-CM | POA: Diagnosis not present

## 2021-12-25 ENCOUNTER — Ambulatory Visit: Payer: Medicare PPO | Admitting: Cardiology

## 2022-01-10 ENCOUNTER — Ambulatory Visit: Payer: Medicare PPO | Admitting: Internal Medicine

## 2022-01-10 ENCOUNTER — Telehealth: Payer: Self-pay | Admitting: Internal Medicine

## 2022-01-10 ENCOUNTER — Encounter: Payer: Self-pay | Admitting: Internal Medicine

## 2022-01-10 VITALS — BP 124/82 | HR 79 | Temp 98.0°F | Ht 63.0 in | Wt 189.6 lb

## 2022-01-10 DIAGNOSIS — R051 Acute cough: Secondary | ICD-10-CM

## 2022-01-10 DIAGNOSIS — J0100 Acute maxillary sinusitis, unspecified: Secondary | ICD-10-CM

## 2022-01-10 MED ORDER — AMOXICILLIN-POT CLAVULANATE 875-125 MG PO TABS: 1.0000 | ORAL_TABLET | Freq: Two times a day (BID) | ORAL | 0 refills | Status: DC

## 2022-01-10 MED ORDER — HYDROCOD POLI-CHLORPHE POLI ER 10-8 MG/5ML PO SUER: 5.0000 mL | Freq: Two times a day (BID) | ORAL | 0 refills | Status: DC | PRN

## 2022-01-10 MED ORDER — PREDNISONE 10 MG PO TABS: ORAL_TABLET | ORAL | 0 refills | Status: DC

## 2022-01-10 MED ORDER — HYDROCOD POLI-CHLORPHE POLI ER 10-8 MG/5ML PO SUER: 10.0000 mL | Freq: Two times a day (BID) | ORAL | 0 refills | Status: DC | PRN

## 2022-01-10 NOTE — Telephone Encounter (Signed)
Patient called stating she has a sinus infection and she would like Augmentin called to the pharmacy.

## 2022-01-10 NOTE — Telephone Encounter (Signed)
[place] called and said they need clarification on Chlorpheniramine(HYDROcodone) Dr. Derrel Nip prescription dose its too high.

## 2022-01-10 NOTE — Progress Notes (Signed)
Subjective:  Patient ID: Kaitlin Dean, female    DOB: May 24, 1945  Age: 76 y.o. MRN: [id number]  CC: The primary encounter diagnosis was Acute cough. A diagnosis of Acute non-recurrent maxillary sinusitis was also pertinent to this visit.   HPI Kaitlin Dean presents for  Chief Complaint  Patient presents with   Cough    Cough, chills, green mucus   76 yr old female with  T2DM, atrial fibrillation presents with URI symptoms that have been present for several days. Symptoms of rhinorrhea,  sinus congestion and cough  started after [place] commericial [place] . Several people on the bus where noted to be coughing and sneezing,  and she received a report that many tested positive for influenza.  She Has felt fatigued but not enough to stop her usual activities.  Yesterday developed chills ,  sinus pain and drainage with purulent sputum and drainage .  She denies wheezing and dyspnea.     She is fully  vaccinated against flu and RSV and takes a probiotic daily     Outpatient Medications Prior to Visit  Medication Sig Dispense Refill   albuterol (VENTOLIN HFA) 108 (90 Base) MCG/ACT inhaler Inhale 2 puffs into the lungs every 6 (six) hours as needed for wheezing or shortness of breath. 1 each 0   Cholecalciferol (VITAMIN D3 PO) Take by mouth daily.     ezetimibe-simvastatin (VYTORIN) 10-40 MG tablet TAKE 1 TABLET BY MOUTH DAILY. 90 tablet 1   Multiple Vitamin (MULTIVITAMIN) capsule Take 1 capsule by mouth daily. AM     pantoprazole (PROTONIX) 40 MG tablet TAKE ONE TABLET BY MOUTH DAILY 90 tablet 3   telmisartan (MICARDIS) 40 MG tablet Take 1 tablet (40 mg total) by mouth every morning. 90 tablet 1   vitamin B-12 (CYANOCOBALAMIN) 1000 MCG tablet Take 1,000 mcg by mouth daily.     Vitamin E 400 units TABS Take by mouth daily.     No facility-administered medications prior to visit.    Review of Systems;  Patient denies headache, unintentional weight loss, skin  rash, eye pain, s, sore throat, dysphagia,  hemoptysis ,, chest pain, palpitations, orthopnea, edema, abdominal pain, nausea, melena, diarrhea, constipation, flank pain, dysuria, hematuria, urinary  Frequency, nocturia, numbness, tingling, seizures,  Focal weakness, Loss of consciousness,  Tremor, insomnia, depression, anxiety, and suicidal ideation.      Objective:  BP 124/82   Pulse 79   Temp 98 F (36.7 C) (Oral)   Ht '5\' 3"'$  (1.6 m)   Wt 189 lb 9.6 oz (86 kg)   LMP 01/29/1993   SpO2 97%   BMI 33.59 kg/m   BP Readings from Last 3 Encounters:  01/10/22 124/82  11/28/21 120/74  06/07/21 122/70    Wt Readings from Last 3 Encounters:  01/10/22 189 lb 9.6 oz (86 kg)  11/28/21 188 lb 6.4 oz (85.5 kg)  06/07/21 185 lb 9.6 oz (84.2 kg)    Physical Exam  Lab Results  Component Value Date   HGBA1C 6.3 11/28/2021   HGBA1C 6.0 06/07/2021   HGBA1C 6.1 02/06/2021    Lab Results  Component Value Date   CREATININE 0.86 11/28/2021   CREATININE 0.90 06/07/2021   CREATININE 0.91 02/06/2021    Lab Results  Component Value Date   WBC 4.8 06/07/2021   HGB 13.9 06/07/2021   HCT 42.0 06/07/2021   PLT 230.0 06/07/2021   GLUCOSE 117 (H) 11/28/2021   CHOL 132  11/28/2021   TRIG 116.0 11/28/2021   HDL 39.00 (L) 11/28/2021   LDLDIRECT 182.4 02/05/2012   LDLCALC 70 11/28/2021   ALT 35 11/28/2021   AST 27 11/28/2021   NA 138 11/28/2021   K 4.0 11/28/2021   CL 103 11/28/2021   CREATININE 0.86 11/28/2021   BUN 23 11/28/2021   CO2 26 11/28/2021   TSH 3.22 11/28/2021   INR 0.99 04/22/2012   HGBA1C 6.3 11/28/2021   MICROALBUR <0.7 06/07/2021    MM 3D SCREEN BREAST BILATERAL  Result Date: 12/06/2021 CLINICAL DATA:  Screening. EXAM: DIGITAL SCREENING BILATERAL MAMMOGRAM WITH TOMOSYNTHESIS AND CAD TECHNIQUE: Bilateral screening digital craniocaudal and mediolateral oblique mammograms were obtained. Bilateral screening digital breast tomosynthesis was performed. The images were  evaluated with computer-aided detection. COMPARISON:  Previous exam(s). ACR Breast Density Category b: There are scattered areas of fibroglandular density. FINDINGS: There are no findings suspicious for malignancy. IMPRESSION: No mammographic evidence of malignancy. A result letter of this screening mammogram will be mailed directly to the patient. RECOMMENDATION: Screening mammogram in one year. (Code:SM-B-01Y) BI-RADS CATEGORY  1: Negative. Electronically Signed   By: Lillia [place] M.D.   On: 12/06/2021 16:14    Assessment & Plan:  .Acute cough -     POCT Influenza A/B -     POC COVID-19 BinaxNow -     Respiratory virus panel  Acute non-recurrent maxillary sinusitis Assessment & Plan: Testing ws negative for RSV, COVID and influenza .  Treat for acute sinusitis with augmentin, prednisone and tussionex    Other orders -     Amoxicillin-Pot Clavulanate; Take 1 tablet by mouth 2 (two) times daily.  Dispense: 14 tablet; Refill: 0 -     predniSONE; 6 tablets on Day 1 , then reduce by 1 tablet daily until gone  Dispense: 21 tablet; Refill: 0    Follow-up: No follow-ups on file.   Crecencio Mc, MD

## 2022-01-10 NOTE — Patient Instructions (Signed)
I am treating you for sinusitis   I am prescribing an antibiotic (augmenti) and a prednisone taper  (optional) To manage the infection and the inflammation in your ear/sinuses.   flush your sinuses twice daily with Simply Saline (do over the sink because if you do it right you will spit out globs of mucus)  Use Robitussin DM for daytime cough  Use the tussionex liquid cough syrup at night for severe cough or headache.  It has vicodin in it so DO NOT SHARE WITH OTHERS  Please continue your  probiotic because  while you are on the antibiotic you are at increased risk for a   serious antibiotic associated diarrhea  Called clostridium dificile colitis

## 2022-01-10 NOTE — Telephone Encounter (Signed)
S/w pt - stated having congestion and drainage. Started spitting up green/yellow phlegm yesterday.  Pt stated due to problem with esophagus, any drainage turns into a sinus infection.  Pt denies fever, chills, chest pain, s.o.b  Sched w/ Dr Derrel Nip this afternoon

## 2022-01-11 NOTE — Telephone Encounter (Signed)
noted 

## 2022-01-12 LAB — RESPIRATORY VIRUS PANEL

## 2022-01-13 LAB — POCT INFLUENZA A/B

## 2022-01-13 LAB — POC COVID19 BINAXNOW

## 2022-01-13 NOTE — Assessment & Plan Note (Signed)
Testing ws negative for RSV, COVID and influenza .  Treat for acute sinusitis with augmentin, prednisone and tussionex

## 2022-01-15 ENCOUNTER — Telehealth: Payer: Self-pay | Admitting: Internal Medicine

## 2022-01-15 ENCOUNTER — Other Ambulatory Visit: Payer: Self-pay | Admitting: Internal Medicine

## 2022-01-15 NOTE — Telephone Encounter (Signed)
The pt called stating the pharmacy is not filling the tussinex because the provider put the wrong dosage on the bottle. Pt saw tullo

## 2022-01-15 NOTE — Telephone Encounter (Signed)
Pt is aware and stated that she will check with pharmacy.

## 2022-01-15 NOTE — Telephone Encounter (Signed)
Looks like Dr Derrel Nip has addressed.  Let me know if I need to do anything.

## 2022-02-07 ENCOUNTER — Other Ambulatory Visit: Payer: Self-pay | Admitting: Internal Medicine

## 2022-02-14 ENCOUNTER — Telehealth: Payer: Self-pay | Admitting: Internal Medicine

## 2022-02-14 NOTE — Telephone Encounter (Signed)
Left message for patient to call back and schedule Medicare Annual Wellness Visit (AWV) in office.   If not able to come in office, please offer to do virtually or by telephone.  Left office number and my jabber [phone].  Last AWV:02/27/2021   Please schedule at anytime with Nurse Health Advisor.

## 2022-03-12 ENCOUNTER — Ambulatory Visit (INDEPENDENT_AMBULATORY_CARE_PROVIDER_SITE_OTHER): Payer: Medicare PPO

## 2022-03-12 VITALS — Ht 63.0 in | Wt 189.0 lb

## 2022-03-12 DIAGNOSIS — Z0000 Encounter for general adult medical examination without abnormal findings: Secondary | ICD-10-CM

## 2022-03-12 NOTE — Progress Notes (Signed)
Subjective:   Kaitlin Dean is a 77 y.o. female who presents for Medicare Annual (Subsequent) preventive examination.  Review of Systems    No ROS.  Medicare Wellness Virtual Visit.  Visual/audio telehealth visit, UTA vital signs.   See social history for additional risk factors.   Cardiac Risk Factors include: advanced age (>84mn, >>44women)     Objective:    Today's Vitals   03/12/22 1136  Weight: 189 lb (85.7 kg)  Height: 5' 3"$  (1.6 m)   Body mass index is 33.48 kg/m.     03/12/2022   11:37 AM 02/27/2021    8:43 AM 02/25/2020    8:57 AM 05/25/2019    8:21 AM 03/17/2019    1:17 PM 02/24/2019    8:43 AM 12/16/2018    9:51 AM  Advanced Directives  Does Patient Have a Medical Advance Directive? No Yes Yes Yes Yes Yes Yes  Type of ASocial research officer, governmentLiving will HWhite PineLiving will HDe SotoLiving will HCyrilLiving will HDeSotoLiving will Living will;Healthcare Power of Attorney  Does patient want to make changes to medical advance directive?  No - Patient declined No - Patient declined   No - Patient declined No - Patient declined  Copy of [place] Chart?  No - copy requested No - copy requested Yes - validated most recent copy scanned in chart (See row information) No - copy requested No - copy requested   Would patient like information on creating a medical advance directive? No - Patient declined          Current Medications (verified) Outpatient Encounter Medications as of 03/12/2022  Medication Sig   albuterol (VENTOLIN HFA) 108 (90 Base) MCG/ACT inhaler Inhale 2 puffs into the lungs every 6 (six) hours as needed for wheezing or shortness of breath.   amoxicillin-clavulanate (AUGMENTIN) 875-125 MG tablet Take 1 tablet by mouth 2 (two) times daily.   chlorpheniramine-HYDROcodone (TUSSIONEX) 10-8 MG/5ML Take 5 mLs by mouth every 12  (twelve) hours as needed for cough.   Cholecalciferol (VITAMIN D3 PO) Take by mouth daily.   ezetimibe-simvastatin (VYTORIN) 10-40 MG tablet TAKE 1 TABLET BY MOUTH DAILY.   Multiple Vitamin (MULTIVITAMIN) capsule Take 1 capsule by mouth daily. AM   pantoprazole (PROTONIX) 40 MG tablet TAKE ONE TABLET BY MOUTH DAILY   telmisartan (MICARDIS) 40 MG tablet Take 1 tablet (40 mg total) by mouth every morning.   vitamin B-12 (CYANOCOBALAMIN) 1000 MCG tablet Take 1,000 mcg by mouth daily.   Vitamin E 400 units TABS Take by mouth daily.   [DISCONTINUED] predniSONE (DELTASONE) 10 MG tablet 6 tablets on Day 1 , then reduce by 1 tablet daily until gone   No facility-administered encounter medications on file as of 03/12/2022.    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)    Shoulder fracture, right 11/07/2018   Skin cancer    Past Surgical History:  Procedure Laterality Date   BREAST EXCISIONAL BIOPSY Right 2000   benign   BREAST SURGERY  2000   Biopsy   CATARACT EXTRACTION W/PHACO Right 12/05/2016   Procedure: CATARACT EXTRACTION PHACO AND INTRAOCULAR LENS PLACEMENT ([place]) RIGHT SYMFONY LENS;  Surgeon: Leandrew Koyanagi, MD;  Location: [place];  Service: Ophthalmology;  Laterality: Right;   CATARACT EXTRACTION W/PHACO Left 02/06/2017   Procedure: CATARACT EXTRACTION PHACO AND INTRAOCULAR LENS PLACEMENT ([place]) LEFT  SYMFONY LENS;  Surgeon: Leandrew Koyanagi, MD;  Location: [place];  Service: Ophthalmology;  Laterality: Left;   COLONOSCOPY     ESOPHAGOGASTRODUODENOSCOPY     ESOPHAGOGASTRODUODENOSCOPY (EGD) WITH PROPOFOL N/A 06/11/2016   Procedure: ESOPHAGOGASTRODUODENOSCOPY (EGD) WITH PROPOFOL;  Surgeon: Manya Silvas, MD;  [facility] ENDOSCOPY;  Service: Endoscopy;  Laterality: N/A;   ESOPHAGOGASTRODUODENOSCOPY (EGD) WITH PROPOFOL N/A 05/25/2019   Procedure: ESOPHAGOGASTRODUODENOSCOPY (EGD) WITH BIOPSY;  Surgeon: Lucilla Lame, MD;  Location: [place];  Service: Endoscopy;  Laterality: N/A;  Priority 3   JOINT REPLACEMENT Left    knee   PTOSIS REPAIR Bilateral 06/01/2014   Procedure: PTOSIS REPAIR;  Surgeon: Karle Starch, MD;  Location: [place];  Service: Ophthalmology;  Laterality: Bilateral;   SKIN CANCER EXCISION     TOTAL KNEE ARTHROPLASTY Left 04/30/2012   Procedure: TOTAL KNEE ARTHROPLASTY;  Surgeon: Gearlean Alf, MD;  Location: [facility];  Service: Orthopedics;  Laterality: Left;   TUBAL LIGATION     Family History  Problem Relation Age of Onset   Hypertension Mother    Congestive Heart Failure Mother    Stroke Father    Stroke Brother    Heart failure Brother    Sarcoidosis Sister        died of kidney failure   Breast cancer Neg Hx    Colon cancer Neg Hx    Social History   Socioeconomic History   Marital status: Widowed    Spouse name: Not on file   Number of children: 2   Years of education: Not on file   Highest education level: Not on file  Occupational History    Comment: Retired Statistician  Tobacco Use   Smoking status: Never   Smokeless tobacco: Never  Vaping Use   Vaping Use: Never used  Substance and Sexual Activity   Alcohol use: Yes    Alcohol/week: 1.0 standard drink of alcohol    Types: 1 Glasses of wine per week    Comment: SOCIALLY   Drug use: No   Sexual activity: Not Currently  Other Topics Concern   Not  on file  Social History Narrative   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[employer]   Retired Diplomatic Services operational officer in the [employer]   1 son and 1 daughter, 1 son is a Social research officer, government working for [employer] and the daughter lives in [place]   Rare alcohol non-smoker   Social Determinants of Health   Financial Resource Strain: [place]  (03/12/2022)   Overall Financial Resource Strain (CARDIA)    Difficulty of Paying Living Expenses: Not hard at all  Food Insecurity: No [place] (03/12/2022)   Hunger Vital Sign    Worried About [place] in the Last Year: Never true    [place] of Food  in the Last Year: Never true  Transportation Needs: No Transportation Needs (03/12/2022)   PRAPARE - Hydrologist (Medical): No    Lack of Transportation (Non-Medical): No  Physical Activity: Sufficiently Active (03/12/2022)   Exercise Vital Sign    Days of Exercise per Week: 3 days    Minutes of Exercise per Session: 50 min  Stress: No Stress Concern Present (03/11/2022)   [place]    Feeling of Stress : Not at all  Social Connections: Unknown (03/12/2022)   Social Connection and Isolation Panel [NHANES]    Frequency of Communication with Friends and Family: More than three times a week    Frequency of Social Gatherings with Friends and Family: Three times a week    Attends Religious Services: Not on file    Active Member of Clubs or Organizations: Yes    Attends Club or Organization Meetings: More than 4 times per year    Marital Status: Widowed    Tobacco Counseling Counseling given: Not Answered   Clinical Intake:  Pre-visit preparation completed: Yes        Diabetes: No  How often do you need to have someone help you when you read instructions, pamphlets, or other written materials from your doctor or pharmacy?: 1 - Never    Interpreter  Needed?: No      Activities of Daily Living    03/12/2022   11:41 AM 03/11/2022    3:22 PM  In your present state of health, do you have any difficulty performing the following activities:  Hearing? 0 0  Vision? 0 0  Difficulty concentrating or making decisions? 0 0  Walking or climbing stairs? 0 0  Dressing or bathing? 0 0  Doing errands, shopping? 0 0  Preparing Food and eating ? N N  Using the Toilet? N N  In the past six months, have you accidently leaked urine? N N  Do you have problems with loss of bowel control? N N  Managing your Medications? N N  Managing your Finances? N N  Housekeeping or managing your Housekeeping? N N    Patient Care Team: Einar Pheasant, MD as PCP - General (Internal Medicine)  Indicate any recent Medical Services you may have received from other than [facility] providers in the past year (date may be approximate).     Assessment:   This is a routine wellness examination for [employer].  I connected with  Candance Hucke Trindade on 03/12/22 by a audio enabled telemedicine application and verified that I am speaking with the correct person using two identifiers.  Patient Location: Home  Provider Location: Office/Clinic  I discussed the limitations of evaluation and management by telemedicine. The patient expressed understanding and agreed to proceed.   Hearing/Vision screen Hearing Screening - Comments:: Patient is able to hear conversational tones without difficulty. No issues reported. Vision Screening - Comments:: Follow[facility] by [facility]  Cataract extraction, bilateral   Dietary issues and exercise activities discussed: Current Exercise Habits: Home exercise routine, Time (Minutes): 60, Frequency (Times/Week): 3, Weekly Exercise (Minutes/Week): 180, Intensity: Mild   Goals Addressed               This Visit's Progress     Patient Stated     Weight (lb) < 190 lb (86.2 kg) (pt-stated)   189 lb (85.7 kg)     Lose 5lbs Reduce sugar  intake Lower glucose levels  Depression Screen    03/12/2022   11:40 AM 01/10/2022    1:28 PM 11/28/2021    9:24 AM 06/07/2021    8:30 AM 05/30/2021   12:49 PM 02/27/2021    8:30 AM 11/17/2020    7:47 AM  PHQ 2/9 Scores  PHQ - 2 Score 0 0 0 0 0 0 0    Fall Risk    03/12/2022   11:41 AM 03/11/2022    3:22 PM 01/10/2022    1:28 PM 11/28/2021    9:24 AM 06/07/2021    8:29 AM  Fall Risk   Falls in the past year? 0 0 0 0 0  Number falls in past yr:  0  0   Injury with Fall?    0   Risk for fall due to :   No Fall Risks No Fall Risks No Fall Risks  Follow up Falls evaluation completed;Falls prevention discussed  Falls evaluation completed Falls evaluation completed Falls evaluation completed    FALL RISK PREVENTION PERTAINING TO THE HOME: Home free of loose throw rugs in walkways, pet beds, electrical cords, etc? Yes  Adequate lighting in your home to reduce risk of falls? Yes   ASSISTIVE DEVICES UTILIZED TO PREVENT FALLS: Phone fall life alert? Yes  Use of a cane, walker or w/c? No  Grab bars in the bathroom? Yes  Shower chair or bench in shower? As needed Comfort chair height toilet? Yes   TIMED UP AND GO: Was the test performed? No .   Cognitive Function:    02/19/2017    9:28 AM 02/14/2015    9:03 AM  MMSE - Mini Mental State Exam  Orientation to time 5 5  Orientation to Place 5 5  Registration 3 3  Attention/ Calculation 5 5  Recall 3 3  Language- name 2 objects 2 2  Language- repeat 1 1  Language- follow 3 step command 3 3  Language- read & follow direction 1 1  Write a sentence 1 1  Copy design 1 1  Total score 30 30        03/12/2022   11:47 AM 02/24/2019    8:47 AM 02/21/2018    8:56 AM 02/20/2016    9:30 AM  6CIT Screen  What Year? 0 points 0 points 0 points 0 points  What month? 0 points 0 points 0 points 0 points  What time? 0 points 0 points 0 points 0 points  Count back from 20 0 points  0 points 0 points  Months in reverse 0 points  0  points 0 points  Repeat phrase 0 points  0 points 0 points  Total Score 0 points  0 points 0 points    Immunizations Immunization History  Administered Date(s) Administered   Fluad Quad(high Dose 65+) 11/28/2021   Influenza Split 10/30/2011, 11/14/2012   Influenza, High Dose Seasonal PF 11/01/2015, 11/09/2016, 11/18/2017, 12/03/2018   Influenza,inj,Quad PF,6+ Mos 11/12/2013, 01/18/2015   Influenza-Unspecified 11/23/2011, 12/15/2019, 11/29/2020   PFIZER(Purple Top)SARS-COV-2 Vaccination 02/04/2019, 02/25/2019, 10/26/2019   Pneumococcal Conjugate-13 08/27/2013   Pneumococcal Polysaccharide-23 08/21/2017   Respiratory Syncytial Virus Vaccine,Recomb Aduvanted(Arexvy) 11/01/2021   Zoster Recombinat (Shingrix) 10/01/2017, 01/03/2018   Zoster, Live 03/29/2010   TDAP status: Due, Education has been provided regarding the importance of this vaccine. Advised may receive this vaccine at local pharmacy or Health Dept. Aware to provide a copy of the vaccination record if obtained from local pharmacy or Health Dept. Verbalized acceptance and understanding.  Screening Tests  Health Maintenance  Topic Date Due   DTaP/Tdap/Td (1 - Tdap) Never done   FOOT EXAM  01/13/2021   COVID-19 Vaccine (4 - 2023-24 season) 03/28/2022 (Originally 09/29/2021)   HEMOGLOBIN A1C  05/29/2022   Diabetic kidney evaluation - Urine ACR  06/08/2022   OPHTHALMOLOGY EXAM  10/14/2022   Diabetic kidney evaluation - eGFR measurement  11/29/2022   MAMMOGRAM  12/06/2022   Medicare Annual Wellness (AWV)  03/13/2023   Pneumonia Vaccine 50+ Years old  Completed   INFLUENZA VACCINE  Completed   DEXA SCAN  Completed   Hepatitis C Screening  Completed   Zoster Vaccines- Shingrix  Completed   HPV VACCINES  Aged Out   COLONOSCOPY (Pts 45-73yr Insurance coverage will need to be confirmed)  Discontinued    Health Maintenance Health Maintenance Due  Topic Date Due   DTaP/Tdap/Td (1 - Tdap) Never done   FOOT EXAM  01/13/2021    Lung Cancer Screening: (Low Dose CT Chest recommended if Age 79-80years, 30 pack-year currently smoking OR have quit w/in 15years.) does not qualify.   Hepatitis C Screening: Completed 2017.  Vision Screening: Recommended annual ophthalmology exams for early detection of glaucoma and other disorders of the eye.  Dental Screening: Recommended annual dental exams for proper oral hygiene.  Community Resource Referral / Chronic Care Management: CRR required this visit?  No   CCM required this visit?  No      Plan:     I have personally reviewed and noted the following in the patient's chart:   Medical and social history Use of alcohol, tobacco or illicit drugs  Current medications and supplements including opioid prescriptions. Patient is not currently taking opioid prescriptions. Functional ability and status Nutritional status Physical activity Advanced directives List of other physicians Hospitalizations, surgeries, and ER visits in previous 12 months Vitals Screenings to include cognitive, depression, and falls Referrals and appointments  In addition, I have reviewed and discussed with patient certain preventive protocols, quality metrics, and best practice recommendations. A written personalized care plan for preventive services as well as general preventive health recommendations were provided to patient.     DLeta Jungling LPN   [id number]

## 2022-03-12 NOTE — Patient Instructions (Addendum)
Ms. Kaitlin Dean , Thank you for taking time to come for your Medicare Wellness Visit. I appreciate your ongoing commitment to your health goals. Please review the following plan we discussed and let me know if I can assist you in the future.   These are the goals we discussed:  Goals       Patient Stated     Weight (lb) < 190 lb (86.2 kg) (pt-stated)      Lose 5lbs Reduce sugar intake Lower glucose levels      Other     Maintain Healthy Lifestyle      Make healthy food choices Stay active        This is a list of the screening recommended for you and due dates:  Health Maintenance  Topic Date Due   DTaP/Tdap/Td vaccine (1 - Tdap) Never done   Complete foot exam   01/13/2021   COVID-19 Vaccine (4 - 2023-24 season) 03/28/2022*   Hemoglobin A1C  05/29/2022   Yearly kidney health urinalysis for diabetes  06/08/2022   Eye exam for diabetics  10/14/2022   Yearly kidney function blood test for diabetes  11/29/2022   Mammogram  12/06/2022   Medicare Annual Wellness Visit  03/13/2023   Pneumonia Vaccine  Completed   Flu Shot  Completed   DEXA scan (bone density measurement)  Completed   Hepatitis C Screening: USPSTF Recommendation to screen - Ages 61-79 yo.  Completed   Zoster (Shingles) Vaccine  Completed   HPV Vaccine  Aged Out   Colon Cancer Screening  Discontinued  *Topic was postponed. The date shown is not the original due date.   Conditions/risks identified: none new.  Next appointment: Follow up in one year for your annual wellness visit    Preventive Care 65 Years and Older, Female Preventive care refers to lifestyle choices and visits with your health care provider that can promote health and wellness. What does preventive care include? A yearly physical exam. This is also called an annual well check. Dental exams once or twice a year. Routine eye exams. Ask your health care provider how often you should have your eyes checked. Personal lifestyle choices,  including: Daily care of your teeth and gums. Regular physical activity. Eating a healthy diet. Avoiding tobacco and drug use. Limiting alcohol use. Practicing safe sex. Taking low-dose aspirin every day. Taking vitamin and mineral supplements as recommended by your health care provider. What happens during an annual well check? The services and screenings done by your health care provider during your annual well check will depend on your age, overall health, lifestyle risk factors, and family history of disease. Counseling  Your health care provider may ask you questions about your: Alcohol use. Tobacco use. Drug use. Emotional well-being. Home and relationship well-being. Sexual activity. Eating habits. History of falls. Memory and ability to understand (cognition). Work and work Statistician. Reproductive health. Screening  You may have the following tests or measurements: Height, weight, and BMI. Blood pressure. Lipid and cholesterol levels. These may be checked every 5 years, or more frequently if you are over 41 years old. Skin check. Lung cancer screening. You may have this screening every year starting at age 27 if you have a 30-pack-year history of smoking and currently smoke or have quit within the past 15 years. Fecal occult blood test (FOBT) of the stool. You may have this test every year starting at age 79. Flexible sigmoidoscopy or colonoscopy. You may have a sigmoidoscopy every 5 years or  a colonoscopy every 10 years starting at age 60. Hepatitis C blood test. Hepatitis B blood test. Sexually transmitted disease (STD) testing. Diabetes screening. This is done by checking your blood sugar (glucose) after you have not eaten for a while (fasting). You may have this done every 1-3 years. Bone density scan. This is done to screen for osteoporosis. You may have this done starting at age 75. Mammogram. This may be done every 1-2 years. Talk to your health care provider  about how often you should have regular mammograms. Talk with your health care provider about your test results, treatment options, and if necessary, the need for more tests. Vaccines  Your health care provider may recommend certain vaccines, such as: Influenza vaccine. This is recommended every year. Tetanus, diphtheria, and acellular pertussis (Tdap, Td) vaccine. You may need a Td booster every 10 years. Zoster vaccine. You may need this after age 12. Pneumococcal 13-valent conjugate (PCV13) vaccine. One dose is recommended after age 71. Pneumococcal polysaccharide (PPSV23) vaccine. One dose is recommended after age 49. Talk to your health care provider about which screenings and vaccines you need and how often you need them. This information is not intended to replace advice given to you by your health care provider. Make sure you discuss any questions you have with your health care provider. Document Released: 02/11/2015 Document Revised: 10/05/2015 Document Reviewed: 11/16/2014 Elsevier Interactive Patient Education  2017 [place] Prevention in the Home Falls can cause injuries. They can happen to people of all ages. There are many things you can do to make your home safe and to help prevent falls. What can I do on the outside of my home? Regularly fix the edges of walkways and driveways and fix any cracks. Remove anything that might make you trip as you walk through a door, such as a raised step or threshold. Trim any bushes or trees on the path to your home. Use bright outdoor lighting. Clear any walking paths of anything that might make someone trip, such as rocks or tools. Regularly check to see if handrails are loose or broken. Make sure that both sides of any steps have handrails. Any raised decks and porches should have guardrails on the edges. Have any leaves, snow, or ice cleared regularly. Use sand or salt on walking paths during winter. Clean up any spills in  your garage right away. This includes oil or grease spills. What can I do in the bathroom? Use night lights. Install grab bars by the toilet and in the tub and shower. Do not use towel bars as grab bars. Use non-skid mats or decals in the tub or shower. If you need to sit down in the shower, use a plastic, non-slip stool. Keep the floor dry. Clean up any water that spills on the floor as soon as it happens. Remove soap buildup in the tub or shower regularly. Attach bath mats securely with double-sided non-slip rug tape. Do not have throw rugs and other things on the floor that can make you trip. What can I do in the bedroom? Use night lights. Make sure that you have a light by your bed that is easy to reach. Do not use any sheets or blankets that are too big for your bed. They should not hang down onto the floor. Have a firm chair that has side arms. You can use this for support while you get dressed. Do not have throw rugs and other things on the floor that  can make you trip. What can I do in the kitchen? Clean up any spills right away. Avoid walking on wet floors. Keep items that you use a lot in easy-to-reach places. If you need to reach something above you, use a strong step stool that has a grab bar. Keep electrical cords out of the way. Do not use floor polish or wax that makes floors slippery. If you must use wax, use non-skid floor wax. Do not have throw rugs and other things on the floor that can make you trip. What can I do with my stairs? Do not leave any items on the stairs. Make sure that there are handrails on both sides of the stairs and use them. Fix handrails that are broken or loose. Make sure that handrails are as long as the stairways. Check any carpeting to make sure that it is firmly attached to the stairs. Fix any carpet that is loose or worn. Avoid having throw rugs at the top or bottom of the stairs. If you do have throw rugs, attach them to the floor with carpet  tape. Make sure that you have a light switch at the top of the stairs and the bottom of the stairs. If you do not have them, ask someone to add them for you. What else can I do to help prevent falls? Wear shoes that: Do not have high heels. Have rubber bottoms. Are comfortable and fit you well. Are closed at the toe. Do not wear sandals. If you use a stepladder: Make sure that it is fully opened. Do not climb a closed stepladder. Make sure that both sides of the stepladder are locked into place. Ask someone to hold it for you, if possible. Clearly mark and make sure that you can see: Any grab bars or handrails. First and last steps. Where the edge of each step is. Use tools that help you move around (mobility aids) if they are needed. These include: Canes. Walkers. Scooters. Crutches. Turn on the lights when you go into a dark area. Replace any light bulbs as soon as they burn out. Set up your furniture so you have a clear path. Avoid moving your furniture around. If any of your floors are uneven, fix them. If there are any pets around you, be aware of where they are. Review your medicines with your doctor. Some medicines can make you feel dizzy. This can increase your chance of falling. Ask your doctor what other things that you can do to help prevent falls. This information is not intended to replace advice given to you by your health care provider. Make sure you discuss any questions you have with your health care provider. Document Released: 11/11/2008 Document Revised: 06/23/2015 Document Reviewed: 02/19/2014 Elsevier Interactive Patient Education  2017 [employer].

## 2022-03-28 DIAGNOSIS — D045 Carcinoma in situ of skin of trunk: Secondary | ICD-10-CM | POA: Diagnosis not present

## 2022-03-29 ENCOUNTER — Ambulatory Visit: Payer: Medicare PPO | Admitting: Internal Medicine

## 2022-03-30 ENCOUNTER — Encounter: Payer: Self-pay | Admitting: Internal Medicine

## 2022-03-30 ENCOUNTER — Ambulatory Visit: Payer: Medicare PPO | Admitting: Internal Medicine

## 2022-03-30 VITALS — BP 128/78 | HR 82 | Temp 98.1°F | Ht 63.0 in | Wt 186.8 lb

## 2022-03-30 DIAGNOSIS — R0789 Other chest pain: Secondary | ICD-10-CM

## 2022-03-30 DIAGNOSIS — K449 Diaphragmatic hernia without obstruction or gangrene: Secondary | ICD-10-CM

## 2022-03-30 DIAGNOSIS — K2270 Barrett's esophagus without dysplasia: Secondary | ICD-10-CM

## 2022-03-30 DIAGNOSIS — E7800 Pure hypercholesterolemia, unspecified: Secondary | ICD-10-CM

## 2022-03-30 DIAGNOSIS — E1165 Type 2 diabetes mellitus with hyperglycemia: Secondary | ICD-10-CM | POA: Diagnosis not present

## 2022-03-30 DIAGNOSIS — I499 Cardiac arrhythmia, unspecified: Secondary | ICD-10-CM | POA: Diagnosis not present

## 2022-03-30 DIAGNOSIS — R079 Chest pain, unspecified: Secondary | ICD-10-CM | POA: Diagnosis not present

## 2022-03-30 DIAGNOSIS — Z8619 Personal history of other infectious and parasitic diseases: Secondary | ICD-10-CM

## 2022-03-30 DIAGNOSIS — I700 Atherosclerosis of aorta: Secondary | ICD-10-CM

## 2022-03-30 DIAGNOSIS — R739 Hyperglycemia, unspecified: Secondary | ICD-10-CM

## 2022-03-30 DIAGNOSIS — K219 Gastro-esophageal reflux disease without esophagitis: Secondary | ICD-10-CM

## 2022-03-30 DIAGNOSIS — I10 Essential (primary) hypertension: Secondary | ICD-10-CM | POA: Diagnosis not present

## 2022-03-30 LAB — BASIC METABOLIC PANEL
BUN: 19 mg/dL (ref 6–23)
CO2: 29 mEq/L (ref 19–32)
Calcium: 9.8 mg/dL (ref 8.4–10.5)
Chloride: 104 mEq/L (ref 96–112)
Creatinine, Ser: 0.96 mg/dL (ref 0.40–1.20)
GFR: 56.48 mL/min — ABNORMAL LOW (ref >60.00)
Glucose, Bld: 118 mg/dL — ABNORMAL HIGH (ref 70–99)
Potassium: 4 mEq/L (ref 3.5–5.1)
Sodium: 140 mEq/L (ref 135–145)

## 2022-03-30 LAB — CBC WITH DIFFERENTIAL/PLATELET
Basophils Absolute: 0 10*3/uL (ref 0.0–0.1)
Basophils Relative: 0.4 % (ref 0.0–3.0)
Eosinophils Absolute: 0.2 10*3/uL (ref 0.0–0.7)
Eosinophils Relative: 4 % (ref 0.0–5.0)
HCT: 42.7 % (ref 36.0–46.0)
Hemoglobin: 14.5 g/dL (ref 12.0–15.0)
Lymphocytes Relative: 30.9 % (ref 12.0–46.0)
Lymphs Abs: 1.2 10*3/uL (ref 0.7–4.0)
MCHC: 33.9 g/dL (ref 30.0–36.0)
MCV: 91.4 fl (ref 78.0–100.0)
Monocytes Absolute: 0.3 10*3/uL (ref 0.1–1.0)
Monocytes Relative: 7.8 % (ref 3.0–12.0)
Neutro Abs: 2.2 10*3/uL (ref 1.4–7.7)
Neutrophils Relative %: 56.9 % (ref 43.0–77.0)
Platelets: 242 10*3/uL (ref 150.0–400.0)
RBC: 4.67 Mil/uL (ref 3.87–5.11)
RDW: 13.6 % (ref 11.5–15.5)
WBC: 3.9 10*3/uL — ABNORMAL LOW (ref 4.0–10.5)

## 2022-03-30 LAB — HEMOGLOBIN A1C: Hgb A1c MFr Bld: 6.3 % (ref 4.6–6.5)

## 2022-03-30 LAB — HEPATIC FUNCTION PANEL
ALT: 23 U/L (ref 0–35)
AST: 24 U/L (ref 0–37)
Albumin: 4.1 g/dL (ref 3.5–5.2)
Alkaline Phosphatase: 87 U/L (ref 39–117)
Bilirubin, Direct: 0.2 mg/dL (ref 0.0–0.3)
Total Bilirubin: 1.1 mg/dL (ref 0.2–1.2)
Total Protein: 6.5 g/dL (ref 6.0–8.3)

## 2022-03-30 LAB — LIPID PANEL
Cholesterol: 125 mg/dL (ref 0–200)
HDL: 35.9 mg/dL — ABNORMAL LOW (ref >39.00)
LDL Cholesterol: 65 mg/dL (ref 0–99)
NonHDL: 88.74
Total CHOL/HDL Ratio: 3
Triglycerides: 121 mg/dL (ref 0.0–149.0)
VLDL: 24.2 mg/dL (ref 0.0–40.0)

## 2022-03-30 MED ORDER — PANTOPRAZOLE SODIUM 40 MG PO TBEC: 40.0000 mg | DELAYED_RELEASE_TABLET | Freq: Two times a day (BID) | ORAL | 4 refills | Status: DC

## 2022-03-30 NOTE — Progress Notes (Unsigned)
Subjective:    Patient ID: Kaitlin Dean, female    DOB: [birth year], 77 y.o.   MRN: [id number]  Patient here for  Chief Complaint  Patient presents with   Medical Management of Chronic Issues    Sporactic chest pain x 1 week No chest pain today    HPI Here to follow up regarding hypercholesterolemia, diabetes and hypertension.  Continues on micardis. Was referred to cardiology last visit - watch alarmed - afib on two separate occasions.  They contacted her, but she did not want to schedule at that time.  Wanted to hold on referral. Recently,  reports she has been having intermittent chest pain over the past few weeks.  Describes initially, she was pulling a wagon up a hill.  Noticed increased fatigue.  Felt tired.  This was a change for her.  Then has had intermittent episodes of chest tightness/pain. May last up to 30 minutes when occurs.  Has been intermittent episodes. No pain or tightness today.  She has started taking aspirin since this has started occurring.  Als taking alka seltzer.  Has noticed some worsening acid reflux.  Discussed protonix.  Blood pressure ok.  No abdominal pain or bowel change reported.  Went to dentist this week - blood pressure 115/68. Of note, was seen 01/10/22 - treated with augmentin for sinus infeciton.  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)     Shoulder fracture, right 11/07/2018   Skin cancer    Past Surgical History:  Procedure Laterality Date   BREAST EXCISIONAL BIOPSY Right 2000   benign   BREAST SURGERY  2000   Biopsy   CATARACT EXTRACTION W/PHACO Right 12/05/2016   Procedure: CATARACT EXTRACTION PHACO AND INTRAOCULAR LENS PLACEMENT ([place]) RIGHT SYMFONY LENS;  Surgeon: Leandrew Koyanagi, MD;  [facility];  Service: Ophthalmology;  Laterality: Right;   CATARACT EXTRACTION W/PHACO Left 02/06/2017   Procedure: CATARACT EXTRACTION PHACO AND INTRAOCULAR LENS PLACEMENT ([place]) LEFT SYMFONY LENS;  Surgeon: Leandrew Koyanagi, MD;  Location: [facility];  Service: Ophthalmology;  Laterality: Left;   COLONOSCOPY     ESOPHAGOGASTRODUODENOSCOPY     ESOPHAGOGASTRODUODENOSCOPY (EGD) WITH PROPOFOL N/A 06/11/2016   Procedure: ESOPHAGOGASTRODUODENOSCOPY (EGD) WITH PROPOFOL;  Surgeon: Manya Silvas, MD;  Location: [facility];  Service: Endoscopy;  Laterality: N/A;   ESOPHAGOGASTRODUODENOSCOPY (EGD) WITH PROPOFOL N/A 05/25/2019   Procedure: ESOPHAGOGASTRODUODENOSCOPY (EGD) WITH BIOPSY;  Surgeon: Lucilla Lame, MD;  Location: [facility];  Service: Endoscopy;  Laterality: N/A;  Priority 3   JOINT REPLACEMENT Left    knee   PTOSIS REPAIR Bilateral 06/01/2014   Procedure: PTOSIS REPAIR;  Surgeon: Karle Starch, MD;  Location: [facility];  Service: Ophthalmology;  Laterality: Bilateral;   SKIN  CANCER EXCISION     TOTAL KNEE ARTHROPLASTY Left 04/30/2012   Procedure: TOTAL KNEE ARTHROPLASTY;  Surgeon: Gearlean Alf, MD;  Location: [facility];  Service: Orthopedics;  Laterality: Left;   TUBAL LIGATION     Family History  Problem Relation Age of Onset   Hypertension Mother    Congestive Heart Failure Mother    Stroke Father    Stroke Brother    Heart failure Brother    Sarcoidosis Sister        died of kidney failure   Breast cancer Neg Hx    Colon cancer Neg Hx    Social History   Socioeconomic  History   Marital status: Widowed    Spouse name: Not on file   Number of children: 2   Years of education: Not on file   Highest education level: Not on file  Occupational History    Comment: Retired Statistician  Tobacco Use   Smoking status: Never   Smokeless tobacco: Never  Vaping Use   Vaping Use: Never used  Substance and Sexual Activity   Alcohol use: Yes    Alcohol/week: 1.0 standard drink of alcohol    Types: 1 Glasses of wine per week    Comment: SOCIALLY   Drug use: No   Sexual activity: Not Currently  Other Topics Concern   Not on file  Social History Narrative   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[employer]   Retired Diplomatic Services operational officer in the [employer]   1 son and 1 daughter, 1 son is a Social research officer, government working for [employer] and the daughter lives in [place]   Rare alcohol non-smoker   Social Determinants of Health   Financial Resource Strain: [place]  (03/12/2022)   Overall Financial Resource Strain (CARDIA)    Difficulty of Paying Living Expenses: Not hard at all  Food Insecurity: No [place] (03/12/2022)   Hunger Vital Sign    Worried About Running Out of Food in the Last Year: Never true    [place] in the Last Year: Never true  Transportation Needs: No Transportation Needs (03/12/2022)   PRAPARE - Hydrologist (Medical): No    Lack of Transportation (Non-Medical): No  Physical Activity: Sufficiently Active (03/12/2022)   Exercise Vital Sign    Days of Exercise per Week: 3 days    Minutes of Exercise per Session: 50 min  Stress: No Stress Concern Present (03/11/2022)   [place]    Feeling of Stress : Not at all  Social Connections: Unknown (03/12/2022)   Social Connection and Isolation Panel [NHANES]    Frequency of Communication with Friends and Family: More than three times a week    Frequency of Social  Gatherings with Friends and Family: Three times a week    Attends Religious Services: Not on file    Active Member of Clubs or Organizations: Yes    Attends Club or Organization Meetings: More than 4 times per year    Marital Status: Widowed     Review of Systems  Constitutional:  Negative for appetite change and unexpected weight change.  HENT:  Negative for congestion and sinus pressure.   Respiratory:  Positive for chest tightness. Negative for cough and shortness of breath.   Cardiovascular:  Positive for chest pain. Negative for palpitations.  Gastrointestinal:  Negative for abdominal pain, diarrhea, nausea and  vomiting.  Genitourinary:  Negative for difficulty urinating and dysuria.  Musculoskeletal:  Negative for joint swelling and myalgias.  Skin:  Negative for color change and rash.  Neurological:  Negative for dizziness and headaches.  Psychiatric/Behavioral:  Negative for agitation and dysphoric mood.        Objective:     BP 128/78   Pulse 82   Temp 98.1 F (36.7 C)   Ht '5\' 3"'$  (1.6 m)   Wt 186 lb 12.8 oz (84.7 kg)   LMP 01/29/1993   SpO2 98%   BMI 33.09 kg/m  Wt Readings from Last 3 Encounters:  03/30/22 186 lb 12.8 oz (84.7 kg)  03/12/22 189 lb (85.7 kg)  01/10/22 189 lb 9.6 oz (86 kg)    Physical Exam Vitals reviewed.  Constitutional:      General: She is not in acute distress.    Appearance: Normal appearance.  HENT:     Head: Normocephalic and atraumatic.     Right Ear: External ear normal.     Left Ear: External ear normal.  Eyes:     General: No scleral icterus.       Right eye: No discharge.        Left eye: No discharge.     Conjunctiva/sclera: Conjunctivae normal.  Neck:     Thyroid: No thyromegaly.  Cardiovascular:     Rate and Rhythm: Normal rate and regular rhythm.  Pulmonary:     Effort: No respiratory distress.     Breath sounds: Normal breath sounds. No wheezing.  Abdominal:     General: Bowel sounds are normal.      Palpations: Abdomen is soft.     Tenderness: There is no abdominal tenderness.  Musculoskeletal:        General: No swelling or tenderness.     Cervical back: Neck supple. No tenderness.  Lymphadenopathy:     Cervical: No cervical adenopathy.  Skin:    Findings: No erythema or rash.  Neurological:     Mental Status: She is alert.  Psychiatric:        Mood and Affect: Mood normal.        Behavior: Behavior normal.      Outpatient Encounter Medications as of 03/30/2022  Medication Sig   albuterol (VENTOLIN HFA) 108 (90 Base) MCG/ACT inhaler Inhale 2 puffs into the lungs every 6 (six) hours as needed for wheezing or shortness of breath.   Cholecalciferol (VITAMIN D3 PO) Take by mouth daily.   ezetimibe-simvastatin (VYTORIN) 10-40 MG tablet TAKE 1 TABLET BY MOUTH DAILY.   Multiple Vitamin (MULTIVITAMIN) capsule Take 1 capsule by mouth daily. AM   telmisartan (MICARDIS) 40 MG tablet Take 1 tablet (40 mg total) by mouth every morning.   vitamin B-12 (CYANOCOBALAMIN) 1000 MCG tablet Take 1,000 mcg by mouth daily.   Vitamin E 400 units TABS Take by mouth daily.   [DISCONTINUED] pantoprazole (PROTONIX) 40 MG tablet TAKE ONE TABLET BY MOUTH DAILY   pantoprazole (PROTONIX) 40 MG tablet Take 1 tablet (40 mg total) by mouth 2 (two) times daily before a meal.   [DISCONTINUED] amoxicillin-clavulanate (AUGMENTIN) 875-125 MG tablet Take 1 tablet by mouth 2 (two) times daily.   [DISCONTINUED] chlorpheniramine-HYDROcodone (TUSSIONEX) 10-8 MG/5ML Take 5 mLs by mouth every 12 (twelve) hours as needed for cough.   No facility-administered encounter medications on file as of 03/30/2022.     Lab Results  Component Value Date   WBC 3.9 (L) 03/30/2022   HGB 14.5 03/30/2022  HCT 42.7 03/30/2022   PLT 242.0 03/30/2022   GLUCOSE 118 (H) 03/30/2022   CHOL 125 03/30/2022   TRIG 121.0 03/30/2022   HDL 35.90 (L) 03/30/2022   LDLDIRECT 182.4 02/05/2012   LDLCALC 65 03/30/2022   ALT 23 03/30/2022   AST 24  03/30/2022   NA 140 03/30/2022   K 4.0 03/30/2022   CL 104 03/30/2022   CREATININE 0.96 03/30/2022   BUN 19 03/30/2022   CO2 29 03/30/2022   TSH 3.22 11/28/2021   INR 0.99 04/22/2012   HGBA1C 6.3 03/30/2022   MICROALBUR <0.7 06/07/2021    MM 3D SCREEN BREAST BILATERAL  Result Date: 12/06/2021 CLINICAL DATA:  Screening. EXAM: DIGITAL SCREENING BILATERAL MAMMOGRAM WITH TOMOSYNTHESIS AND CAD TECHNIQUE: Bilateral screening digital craniocaudal and mediolateral oblique mammograms were obtained. Bilateral screening digital breast tomosynthesis was performed. The images were evaluated with computer-aided detection. COMPARISON:  Previous exam(s). ACR Breast Density Category b: There are scattered areas of fibroglandular density. FINDINGS: There are no findings suspicious for malignancy. IMPRESSION: No mammographic evidence of malignancy. A result letter of this screening mammogram will be mailed directly to the patient. RECOMMENDATION: Screening mammogram in one year. (Code:SM-B-01Y) BI-RADS CATEGORY  1: Negative. Electronically Signed   By: Lillia Mountain M.D.   On: 12/06/2021 16:14       Assessment & Plan:  Primary hypertension Assessment & Plan: Continue micardis.  Blood pressure doing well.  Follow pressures.  Follow metabolic panel.    Orders: -     Basic metabolic panel -     Hepatic function panel  Type 2 diabetes mellitus with hyperglycemia, without long-term current use of insulin (HCC) Assessment & Plan: Low carb diet and exercise. Has adjusted diet.  Follow met b and a1c.   Orders: -     Basic metabolic panel -     Hepatic function panel -     Hemoglobin A1c  Hypercholesterolemia Assessment & Plan: Continue vytorin.  Low cholesterol diet and exercise. Follow lipid panel and liver function tests.    Orders: -     Lipid panel  Chest pain, unspecified type -     EKG 12-Lead -     Ambulatory referral to Cardiology -     CBC with Differential/Platelet  Aortic  atherosclerosis ([place]) Assessment & Plan: Continue vytorin.     Barrett's esophagus without dysplasia Assessment & Plan: EGD 04/2019.  Followed by GI - Dr Allen Norris. On protonix.  Follow.    Hiatal hernia with GERD Assessment & Plan: Has noticed some acid reflux as outlined.  Taking protonix. Break through symptoms.  Discussed possible GI origin for her recent symptoms, but given increased fatigue with exertion and given intermittent chest tightness as outlined, discussed the need for cardiac evaluation.  Increase protonix to bid while pursuing further cardiac evaluation.     History of HPV infection Assessment & Plan: PAP 09/2020 - negative with negative HPV.    Hyperglycemia Assessment & Plan: Low carb diet and exercise.  Follow met b and a1c.    Irregular heart rhythm Assessment & Plan: Her watch previously alarmed afib on two separate occasions.  Discussed further evaluation - possible monitor, echo, etc.  Desires referral to cardiology for further evaluation.  Had referred for this last visit.  When contacted to schedule appt, she had wanted to hold.  Refer back to cardiology given recent chest tightness and change in symptoms as outlined.     Chest tightness Assessment & Plan: Describes the increased fatigue with  pulling the wagon up the hill and intermittent chest tightness as outlined.  EKG -- SR with no acute ischemic change.  Watch previously alarmed two episodes of afib as outlined.  Given risk factors and symptoms as above, discussed the need for further cardiac evaluation.  Pt in agreement.  Referral placed to cardiology.  Discussed if return of symptoms or change or worsening symptoms, she is to be evaluated immediately.     Other orders -     Pantoprazole Sodium; Take 1 tablet (40 mg total) by mouth 2 (two) times daily before a meal.  Dispense: 180 tablet; Refill: 4     Einar Pheasant, MD

## 2022-04-01 ENCOUNTER — Encounter: Payer: Self-pay | Admitting: Internal Medicine

## 2022-04-01 DIAGNOSIS — R0789 Other chest pain: Secondary | ICD-10-CM | POA: Insufficient documentation

## 2022-04-01 NOTE — Assessment & Plan Note (Signed)
Describes the increased fatigue with pulling the wagon up the hill and intermittent chest tightness as outlined.  EKG -- SR with no acute ischemic change.  Watch previously alarmed two episodes of afib as outlined.  Given risk factors and symptoms as above, discussed the need for further cardiac evaluation.  Pt in agreement.  Referral placed to cardiology.  Discussed if return of symptoms or change or worsening symptoms, she is to be evaluated immediately.

## 2022-04-01 NOTE — Assessment & Plan Note (Signed)
PAP 09/2020 - negative with negative HPV.

## 2022-04-01 NOTE — Assessment & Plan Note (Signed)
Her watch previously alarmed afib on two separate occasions.  Discussed further evaluation - possible monitor, echo, etc.  Desires referral to cardiology for further evaluation.  Had referred for this last visit.  When contacted to schedule appt, she had wanted to hold.  Refer back to cardiology given recent chest tightness and change in symptoms as outlined.

## 2022-04-01 NOTE — Assessment & Plan Note (Signed)
Low carb diet and exercise.  Follow met b and a1c.   

## 2022-04-01 NOTE — Assessment & Plan Note (Signed)
Continue vytorin.

## 2022-04-01 NOTE — Assessment & Plan Note (Signed)
Continue vytorin.  Low cholesterol diet and exercise. Follow lipid panel and liver function tests.

## 2022-04-01 NOTE — Assessment & Plan Note (Signed)
Continue micardis.  Blood pressure doing well.  Follow pressures.  Follow metabolic panel.

## 2022-04-01 NOTE — Assessment & Plan Note (Signed)
EGD 04/2019.  Followed by GI - Dr Allen Norris. On protonix.  Follow.

## 2022-04-01 NOTE — Assessment & Plan Note (Signed)
Has noticed some acid reflux as outlined.  Taking protonix. Break through symptoms.  Discussed possible GI origin for her recent symptoms, but given increased fatigue with exertion and given intermittent chest tightness as outlined, discussed the need for cardiac evaluation.  Increase protonix to bid while pursuing further cardiac evaluation.

## 2022-04-01 NOTE — Assessment & Plan Note (Signed)
Low carb diet and exercise.  Has adjusted diet.  Follow met b and a1c.   

## 2022-04-04 DIAGNOSIS — Z48817 Encounter for surgical aftercare following surgery on the skin and subcutaneous tissue: Secondary | ICD-10-CM | POA: Diagnosis not present

## 2022-04-30 DIAGNOSIS — Z961 Presence of intraocular lens: Secondary | ICD-10-CM | POA: Diagnosis not present

## 2022-04-30 LAB — HM DIABETES EYE EXAM

## 2022-05-02 ENCOUNTER — Ambulatory Visit: Payer: Medicare PPO | Admitting: Cardiology

## 2022-05-21 ENCOUNTER — Telehealth: Payer: Self-pay | Admitting: Internal Medicine

## 2022-05-21 NOTE — Telephone Encounter (Signed)
Pt called in staying if we can send her medical record to Dr. Willis Modena which its her Kaitlin Dean in [place]?

## 2022-05-21 NOTE — Telephone Encounter (Signed)
FYI-  Spoke with patient. She is not currently a patient of Dr Hulen Shouts. Patient says that she has a hx of diverticulosis (noted on CT scan from 2018.) Requesting to see Dr Dulce Sellar because Dr Mechele Collin retired. While talking with her she noted a couple of episodes where she has vomited food that was not digested and having to follow a bland diet to manage symptoms. She thinks she has diverticulitis. Denied abd pain, nausea, vomiting, fever, etc while on the phone with her. Advised that she needs eval sooner than waiting for GI referral to process and waiting for first available. Patient is scheduled for Thurs 4/25. Also advised if any change or worsening symptoms- needs to be evaluated ASAP at acute care or ED to confirm nothing acute going on. Patient gave verbal understanding. Will pend GI referral to Dr Dulce Sellar in appt on Thursday

## 2022-05-24 ENCOUNTER — Ambulatory Visit: Payer: Medicare PPO | Admitting: Internal Medicine

## 2022-05-24 VITALS — BP 122/70 | HR 78 | Temp 98.2°F | Resp 16 | Ht 63.0 in | Wt 184.0 lb

## 2022-05-24 DIAGNOSIS — E7800 Pure hypercholesterolemia, unspecified: Secondary | ICD-10-CM

## 2022-05-24 DIAGNOSIS — I10 Essential (primary) hypertension: Secondary | ICD-10-CM

## 2022-05-24 DIAGNOSIS — K2100 Gastro-esophageal reflux disease with esophagitis, without bleeding: Secondary | ICD-10-CM | POA: Diagnosis not present

## 2022-05-24 DIAGNOSIS — K449 Diaphragmatic hernia without obstruction or gangrene: Secondary | ICD-10-CM | POA: Diagnosis not present

## 2022-05-24 DIAGNOSIS — R1032 Left lower quadrant pain: Secondary | ICD-10-CM

## 2022-05-24 DIAGNOSIS — I700 Atherosclerosis of aorta: Secondary | ICD-10-CM | POA: Diagnosis not present

## 2022-05-24 DIAGNOSIS — K2270 Barrett's esophagus without dysplasia: Secondary | ICD-10-CM | POA: Diagnosis not present

## 2022-05-24 DIAGNOSIS — K219 Gastro-esophageal reflux disease without esophagitis: Secondary | ICD-10-CM

## 2022-05-24 DIAGNOSIS — K5730 Diverticulosis of large intestine without perforation or abscess without bleeding: Secondary | ICD-10-CM

## 2022-05-24 DIAGNOSIS — E1165 Type 2 diabetes mellitus with hyperglycemia: Secondary | ICD-10-CM

## 2022-05-24 LAB — CBC WITH DIFFERENTIAL/PLATELET
Basophils Absolute: 0 10*3/uL (ref 0.0–0.1)
Basophils Relative: 0.5 % (ref 0.0–3.0)
Eosinophils Absolute: 0.2 10*3/uL (ref 0.0–0.7)
Eosinophils Relative: 4.3 % (ref 0.0–5.0)
HCT: 43.8 % (ref 36.0–46.0)
Hemoglobin: 14.6 g/dL (ref 12.0–15.0)
Lymphocytes Relative: 30.8 % (ref 12.0–46.0)
Lymphs Abs: 1.2 10*3/uL (ref 0.7–4.0)
MCHC: 33.4 g/dL (ref 30.0–36.0)
MCV: 92.5 fl (ref 78.0–100.0)
Monocytes Absolute: 0.3 10*3/uL (ref 0.1–1.0)
Monocytes Relative: 8.6 % (ref 3.0–12.0)
Neutro Abs: 2.2 10*3/uL (ref 1.4–7.7)
Neutrophils Relative %: 55.8 % (ref 43.0–77.0)
Platelets: 245 10*3/uL (ref 150.0–400.0)
RBC: 4.74 Mil/uL (ref 3.87–5.11)
RDW: 13.4 % (ref 11.5–15.5)
WBC: 3.9 10*3/uL — ABNORMAL LOW (ref 4.0–10.5)

## 2022-05-24 LAB — HEPATIC FUNCTION PANEL
ALT: 31 U/L (ref 0–35)
AST: 30 U/L (ref 0–37)
Albumin: 4.5 g/dL (ref 3.5–5.2)
Alkaline Phosphatase: 93 U/L (ref 39–117)
Bilirubin, Direct: 0.2 mg/dL (ref 0.0–0.3)
Total Bilirubin: 1.1 mg/dL (ref 0.2–1.2)
Total Protein: 6.9 g/dL (ref 6.0–8.3)

## 2022-05-24 LAB — BASIC METABOLIC PANEL
BUN: 12 mg/dL (ref 6–23)
CO2: 26 mEq/L (ref 19–32)
Calcium: 9.8 mg/dL (ref 8.4–10.5)
Chloride: 106 mEq/L (ref 96–112)
Creatinine, Ser: 0.92 mg/dL (ref 0.40–1.20)
GFR: 59.38 mL/min — ABNORMAL LOW (ref >60.00)
Glucose, Bld: 124 mg/dL — ABNORMAL HIGH (ref 70–99)
Potassium: 4.1 mEq/L (ref 3.5–5.1)
Sodium: 142 mEq/L (ref 135–145)

## 2022-05-24 MED ORDER — EZETIMIBE-SIMVASTATIN 10-40 MG PO TABS: 1.0000 | ORAL_TABLET | Freq: Every day | ORAL | 1 refills | Status: DC

## 2022-05-24 MED ORDER — TELMISARTAN 40 MG PO TABS: 40.0000 mg | ORAL_TABLET | Freq: Every morning | ORAL | 1 refills | Status: DC

## 2022-05-24 NOTE — Progress Notes (Signed)
Subjective:    Patient ID: Kaitlin Dean, female    DOB: 10-26-1945, 77 y.o.   MRN: [id number]  Patient here for  Chief Complaint  Patient presents with   Gastroesophageal Reflux    Discuss GI referral    HPI Work in appt recent abdominal pain and wants to discuss GI referral. Reports history of diverticulitis.  Reports that starting one week ago, felt a full sensation intitially.  Ate a hard cookie.  Increased pain.  Progressed.  Nausea.  Some emesis - regurgitation of food.  Increased left lower quadrant pain.  She adjusted her diet.  Has been eating soup and more bland foods.  Reported black stool and hard stool.  Took pepto bismol one time.  Still with some black stool.  Pain is better - reports essentially resolved. Was previously having  worsening acid reflux.  History of Barretts documented.  On protonix.  Increased to bid last visit. Acid reflux is better.  No dysphagia now.  No nausea or vomiting now.  Breathing stable.  In reviewing, she had EGD 04/2019 - gastric polyps and hiatal hernia.  Colonoscopy 10/2013 - diverticulosis and internal hemorrhoids.  No chest pain.  States symptoms resolved with protonix, so she cancelled cardiology appt.  Has been active with no chest pain or sob.    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)    Shoulder fracture, right 11/07/2018   Skin cancer    Past Surgical History:  Procedure Laterality Date   BREAST EXCISIONAL BIOPSY Right  2000   benign   BREAST SURGERY  2000   Biopsy   CATARACT EXTRACTION W/PHACO Right 12/05/2016   Procedure: CATARACT EXTRACTION PHACO AND INTRAOCULAR LENS PLACEMENT (IOC) RIGHT SYMFONY LENS;  Surgeon: Lockie Mola, MD;  Location: [facility];  Service: Ophthalmology;  Laterality: Right;   CATARACT EXTRACTION W/PHACO Left 02/06/2017   Procedure: CATARACT EXTRACTION PHACO AND INTRAOCULAR LENS PLACEMENT (IOC) LEFT SYMFONY LENS;  Surgeon: Lockie Mola, MD;  Location: [facility];  Service: Ophthalmology;  Laterality: Left;   COLONOSCOPY     ESOPHAGOGASTRODUODENOSCOPY     ESOPHAGOGASTRODUODENOSCOPY (EGD) WITH PROPOFOL N/A 06/11/2016   Procedure: ESOPHAGOGASTRODUODENOSCOPY (EGD) WITH PROPOFOL;  Surgeon: Scot Jun, MD;  Location: [facility];  Service: Endoscopy;  Laterality: N/A;   ESOPHAGOGASTRODUODENOSCOPY (EGD) WITH PROPOFOL N/A 05/25/2019   Procedure: ESOPHAGOGASTRODUODENOSCOPY (EGD) WITH BIOPSY;  Surgeon: Midge Minium, MD;  Location: [facility];  Service: Endoscopy;  Laterality: N/A;  Priority 3   JOINT REPLACEMENT Left    knee   PTOSIS REPAIR Bilateral 06/01/2014   Procedure: PTOSIS REPAIR;  Surgeon: Imagene Riches, MD;  Location: [facility];  Service: Ophthalmology;  Laterality: Bilateral;   SKIN CANCER EXCISION     TOTAL KNEE ARTHROPLASTY Left 04/30/2012   Procedure: TOTAL KNEE ARTHROPLASTY;  Surgeon: Loanne Drilling, MD;  Location: [facility];  Service: Orthopedics;  Laterality: Left;   TUBAL LIGATION     Family History  Problem Relation Age of Onset   Hypertension Mother    Congestive Heart Failure Mother    Stroke Father    Stroke Brother    Heart failure Brother    Sarcoidosis Sister        died of kidney failure   Breast cancer Neg Hx    Colon cancer Neg Hx    Social History   Socioeconomic History   Marital status: Widowed    Spouse name: Not on file   Number of children: 2   Years of education: Not on file   Highest  education level: Bachelor's degree (e.g., BA, AB, BS)  Occupational History    Comment: Retired Insurance account manager  Tobacco Use   Smoking status: Never   Smokeless tobacco: Never  Vaping Use   Vaping Use: Never used  Substance and Sexual Activity   Alcohol use: Yes    Alcohol/week: 1.0 standard drink of alcohol    Types: 1 Glasses of wine per week    Comment: SOCIALLY   Drug use: No   Sexual activity: Not Currently  Other Topics Concern   Not on file  Social History Narrative   Patient is a widow and she is a Buyer, retail of [employer]   Retired Microbiologist in the [employer]   1 son and 1 daughter, 1 son is a Forensic scientist working for [employer] and the daughter lives in [place]   Rare alcohol non-smoker   Social Determinants of Health   Financial Resource Strain: Low Risk  (05/23/2022)   Overall Financial Resource Strain (CARDIA)    Difficulty of Paying Living Expenses: Not hard at all  Food Insecurity: No Food Insecurity (05/23/2022)   Hunger Vital Sign    Worried About Running Out of Food in the Last Year: Never true    Ran Out of Food in the Last Year: Never true  Transportation Needs: No Transportation Needs (05/23/2022)   PRAPARE - Administrator, Civil Service (Medical): No    Lack of Transportation (Non-Medical): No  Physical Activity: Sufficiently Active (05/23/2022)   Exercise Vital Sign    Days of Exercise per Week: 5 days    Minutes of Exercise per Session: 60 min  Stress: No Stress Concern Present (05/23/2022)   [employer] of Occupational Health - Occupational Stress Questionnaire    Feeling of Stress : Not at all  Social Connections: Moderately Integrated (05/23/2022)   Social Connection and Isolation Panel [NHANES]    Frequency of Communication with Friends and Family: More than three times a week    Frequency of Social Gatherings with Friends and Family: More than three times a week    Attends Religious Services:  More than 4 times per year    Active Member of [employer] or Organizations: Yes    Attends Banker Meetings: More than 4 times per year    Marital Status: Widowed     Review of Systems  Constitutional:  Negative for fever.       Has adjusted diet.  Eating.  Bland diet.    HENT:  Negative for congestion and sinus pressure.   Respiratory:  Negative for cough, chest tightness and shortness of breath.   Cardiovascular:  Negative for chest pain and palpitations.  Gastrointestinal:        Previous nausea and vomiting - resolved.  Abdominal pain better.  Genitourinary:  Negative for difficulty urinating and dysuria.  Musculoskeletal:  Negative for joint swelling and myalgias.  Skin:  Negative for color change and rash.  Neurological:  Negative for dizziness and headaches.  Psychiatric/Behavioral:  Negative for agitation and dysphoric mood.        Objective:     BP 122/70   Pulse 78   Temp 98.2 F (36.8 C)   Resp 16   Ht 5\' 3"  (1.6 m)   Wt 184 lb (83.5 kg)   LMP 01/29/1993   SpO2 98%   BMI 32.59 kg/m  Wt Readings from Last 3 Encounters:  05/24/22 184 lb (83.5 kg)  03/30/22 186 lb 12.8 oz (84.7 kg)  03/12/22 189 lb (85.7 kg)    Physical Exam Vitals reviewed.  Constitutional:      General: She is not in acute distress.    Appearance: Normal appearance.  HENT:     Head: Normocephalic and atraumatic.     Right Ear: External ear normal.     Left Ear: External ear normal.  Eyes:     General: No scleral icterus.       Right eye: No discharge.        Left eye: No discharge.     Conjunctiva/sclera: Conjunctivae normal.  Neck:     Thyroid: No thyromegaly.  Cardiovascular:     Rate and Rhythm: Normal rate and regular rhythm.  Pulmonary:     Effort: No respiratory distress.     Breath sounds: Normal breath sounds. No wheezing.  Abdominal:     General: Bowel sounds are normal.     Palpations: Abdomen is soft.     Comments: Tenderness - left lower quadrant.     Genitourinary:    Comments: Rectal - heme negative.   Musculoskeletal:        General: No swelling or tenderness.     Cervical back: Neck supple. No tenderness.  Lymphadenopathy:     Cervical: No cervical adenopathy.  Skin:    Findings: No erythema or rash.  Neurological:     Mental Status: She is alert.  Psychiatric:        Mood and Affect: Mood normal.        Behavior: Behavior normal.      Outpatient Encounter Medications as of 05/24/2022  Medication Sig   albuterol (VENTOLIN HFA) 108 (90 Base) MCG/ACT inhaler Inhale 2 puffs into the lungs every 6 (six) hours as needed for wheezing or shortness of breath.   Cholecalciferol (VITAMIN D3 PO) Take by mouth daily.   ezetimibe-simvastatin (VYTORIN) 10-40 MG tablet Take 1 tablet by mouth daily.   Multiple Vitamin (MULTIVITAMIN) capsule Take 1 capsule by mouth daily. AM   pantoprazole (PROTONIX) 40 MG tablet Take 1 tablet (40 mg total) by mouth 2 (two) times daily before a meal.   telmisartan (MICARDIS) 40 MG tablet Take 1 tablet (40 mg total) by mouth every morning.   vitamin B-12 (CYANOCOBALAMIN) 1000 MCG tablet Take 1,000 mcg by mouth daily.   Vitamin E 400 units TABS Take by mouth daily.   [DISCONTINUED] ezetimibe-simvastatin (VYTORIN) 10-40 MG tablet TAKE 1 TABLET BY MOUTH DAILY.   [DISCONTINUED] telmisartan (MICARDIS) 40 MG tablet Take 1 tablet (40 mg total) by mouth every morning.   No facility-administered encounter medications on file as of 05/24/2022.     Lab Results  Component Value Date   WBC 3.9 (L) 05/24/2022   HGB 14.6 05/24/2022   HCT 43.8 05/24/2022   PLT 245.0  05/24/2022   GLUCOSE 124 (H) 05/24/2022   CHOL 125 03/30/2022   TRIG 121.0 03/30/2022   HDL 35.90 (L) 03/30/2022   LDLDIRECT 182.4 02/05/2012   LDLCALC 65 03/30/2022   ALT 31 05/24/2022   AST 30 05/24/2022   NA 142 05/24/2022   K 4.1 05/24/2022   CL 106 05/24/2022   CREATININE 0.92 05/24/2022   BUN 12 05/24/2022   CO2 26 05/24/2022   TSH 3.22  11/28/2021   INR 0.99 04/22/2012   HGBA1C 6.3 03/30/2022   MICROALBUR <0.7 06/07/2021    MM 3D SCREEN BREAST BILATERAL  Result Date: 12/06/2021 CLINICAL DATA:  Screening. EXAM: DIGITAL SCREENING BILATERAL MAMMOGRAM WITH TOMOSYNTHESIS AND CAD TECHNIQUE: Bilateral screening digital craniocaudal and mediolateral oblique mammograms were obtained. Bilateral screening digital breast tomosynthesis was performed. The images were evaluated with computer-aided detection. COMPARISON:  Previous exam(s). ACR Breast Density Category b: There are scattered areas of fibroglandular density. FINDINGS: There are no findings suspicious for malignancy. IMPRESSION: No mammographic evidence of malignancy. A result letter of this screening mammogram will be mailed directly to the patient. RECOMMENDATION: Screening mammogram in one year. (Code:SM-B-01Y) BI-RADS CATEGORY  1: Negative. Electronically Signed   By: Baird Lyons M.D.   On: 12/06/2021 16:14       Assessment & Plan:  Gastroesophageal reflux disease with esophagitis without hemorrhage -     Ambulatory referral to Gastroenterology  Hiatal hernia with GERD Assessment & Plan: Upper symptoms resolved with bid protonix.  GI evaluation as outlined    Diverticulosis of colon without hemorrhage -     Ambulatory referral to Gastroenterology  Left lower quadrant abdominal pain Assessment & Plan: Previous increased abdominal pain - localized left lower quadrant, nausea and some emesis - regurgitation of food.  Bowels as outlined. Now hard stool.  Black. Took one dose of pepto bismol.  Has history of diverticulitis.  Has adjusted diet.  Eating bland foods.  Staying hydrated.  Pain is better.  Hold on abx - symptoms improved.  Benefiber to keep bowels moving.  Acid reflux and dysphagia - resolved on bid protonix.  Given symptoms and persistent tenderness on exam, will check CT abd/pelvis.  Also refer to GI.  Last colonoscopy 2015 - diverticulosis, internal hemorrhoids.     Orders: -     Ambulatory referral to Gastroenterology -     CBC with Differential/Platelet -     Hepatic function panel -     Basic metabolic panel -     CT ABDOMEN PELVIS W CONTRAST; Future  Aortic atherosclerosis (HCC) Assessment & Plan: Continue vytorin.     Barrett's esophagus without dysplasia Assessment & Plan: EGD 04/2019.  Followed by GI - Dr Servando Snare. On protonix.  Upper symptoms controlled on bid protonix.  Continue.  Plan GI evaluation as outlined. Follow.    Hypercholesterolemia Assessment & Plan: Continue vytorin.  Low cholesterol diet and exercise. Follow lipid panel and liver function tests.     Primary hypertension Assessment & Plan: Continue micardis.  Blood pressure doing well.  Follow pressures.  Follow metabolic panel.     Type 2 diabetes mellitus with hyperglycemia, without long-term current use of insulin (HCC) Assessment & Plan: Low carb diet and exercise. Has adjusted diet.  Follow met b and a1c.    Other orders -     Ezetimibe-Simvastatin; Take 1 tablet by mouth daily.  Dispense: 90 tablet; Refill: 1 -     Telmisartan; Take 1 tablet (40 mg total) by mouth every morning.  Dispense: 90 tablet; Refill: 1     Dale Helen, MD

## 2022-05-25 ENCOUNTER — Encounter: Payer: Self-pay | Admitting: Internal Medicine

## 2022-05-25 NOTE — Assessment & Plan Note (Signed)
Continue micardis.  Blood pressure doing well.  Follow pressures.  Follow metabolic panel.   

## 2022-05-25 NOTE — Assessment & Plan Note (Signed)
Upper symptoms resolved with bid protonix.  GI evaluation as outlined

## 2022-05-25 NOTE — Assessment & Plan Note (Signed)
Low carb diet and exercise.  Has adjusted diet.  Follow met b and a1c.   

## 2022-05-25 NOTE — Assessment & Plan Note (Signed)
Continue vytorin.  Low cholesterol diet and exercise. Follow lipid panel and liver function tests.   

## 2022-05-25 NOTE — Assessment & Plan Note (Signed)
EGD 04/2019.  Followed by GI - Dr Servando Snare. On protonix.  Upper symptoms controlled on bid protonix.  Continue.  Plan GI evaluation as outlined. Follow.

## 2022-05-25 NOTE — Assessment & Plan Note (Signed)
Previous increased abdominal pain - localized left lower quadrant, nausea and some emesis - regurgitation of food.  Bowels as outlined. Now hard stool.  Black. Took one dose of pepto bismol.  Has history of diverticulitis.  Has adjusted diet.  Eating bland foods.  Staying hydrated.  Pain is better.  Hold on abx - symptoms improved.  Benefiber to keep bowels moving.  Acid reflux and dysphagia - resolved on bid protonix.  Given symptoms and persistent tenderness on exam, will check CT abd/pelvis.  Also refer to GI.  Last colonoscopy 2015 - diverticulosis, internal hemorrhoids.

## 2022-05-25 NOTE — Assessment & Plan Note (Signed)
Continue vytorin.   

## 2022-05-30 ENCOUNTER — Ambulatory Visit: Payer: Medicare PPO | Admitting: Internal Medicine

## 2022-05-30 ENCOUNTER — Ambulatory Visit
Admission: RE | Admit: 2022-05-30 | Discharge: 2022-05-30 | Disposition: A | Discharge status: Home / Self Care | Payer: Medicare PPO | Source: Ambulatory Visit | Attending: Internal Medicine | Admitting: Internal Medicine

## 2022-05-30 ENCOUNTER — Encounter: Payer: Self-pay | Admitting: Internal Medicine

## 2022-05-30 VITALS — BP 120/68 | HR 87 | Temp 98.0°F | Resp 16 | Ht 63.0 in | Wt 183.4 lb

## 2022-05-30 DIAGNOSIS — K449 Diaphragmatic hernia without obstruction or gangrene: Secondary | ICD-10-CM

## 2022-05-30 DIAGNOSIS — K219 Gastro-esophageal reflux disease without esophagitis: Secondary | ICD-10-CM | POA: Diagnosis not present

## 2022-05-30 DIAGNOSIS — E7800 Pure hypercholesterolemia, unspecified: Secondary | ICD-10-CM

## 2022-05-30 DIAGNOSIS — K2270 Barrett's esophagus without dysplasia: Secondary | ICD-10-CM | POA: Diagnosis not present

## 2022-05-30 DIAGNOSIS — I700 Atherosclerosis of aorta: Secondary | ICD-10-CM | POA: Diagnosis not present

## 2022-05-30 DIAGNOSIS — R1032 Left lower quadrant pain: Secondary | ICD-10-CM | POA: Diagnosis not present

## 2022-05-30 DIAGNOSIS — E1165 Type 2 diabetes mellitus with hyperglycemia: Secondary | ICD-10-CM

## 2022-05-30 DIAGNOSIS — R739 Hyperglycemia, unspecified: Secondary | ICD-10-CM

## 2022-05-30 DIAGNOSIS — K5730 Diverticulosis of large intestine without perforation or abscess without bleeding: Secondary | ICD-10-CM | POA: Diagnosis not present

## 2022-05-30 DIAGNOSIS — I10 Essential (primary) hypertension: Secondary | ICD-10-CM | POA: Diagnosis not present

## 2022-05-30 MED ORDER — IOHEXOL 300 MG/ML  SOLN
100.0000 mL | Freq: Once | INTRAMUSCULAR | Status: AC | PRN
  Administered 2022-05-30: 100 mL via INTRAVENOUS

## 2022-05-30 NOTE — Assessment & Plan Note (Signed)
Previous increased abdominal pain - localized left lower quadrant, nausea and some emesis - regurgitation of food. Has history of diverticulitis.  Has adjusted diet.  Eating bland foods.  Staying hydrated.  Pain is better.  Benefiber to keep bowels moving.  Acid reflux and dysphagia - resolved on bid protonix and since adjusting diet, symptoms controlled on daily protonix. Scheduled for CT abd/pelvis today. Also refer to GI.  Last colonoscopy 2015 - diverticulosis, internal hemorrhoids.

## 2022-05-30 NOTE — Assessment & Plan Note (Signed)
Continue vytorin.   

## 2022-05-30 NOTE — Assessment & Plan Note (Signed)
EGD 04/2019.  Previously followed by GI - Dr Servando Snare. On protonix.  Upper symptoms controlled on protonix as outlined.  Continue.  Plan GI evaluation as outlined.  Request appt with Dr Dulce Sellar.

## 2022-05-30 NOTE — Assessment & Plan Note (Signed)
Upper symptoms controlled with diet adjustment and daily protonix. GI evaluation as outlined

## 2022-05-30 NOTE — Assessment & Plan Note (Signed)
Low carb diet and exercise.  Has adjusted diet.  Follow met b and a1c.   

## 2022-05-30 NOTE — Assessment & Plan Note (Signed)
Continue vytorin.  Low cholesterol diet and exercise. Follow lipid panel and liver function tests.   

## 2022-05-30 NOTE — Assessment & Plan Note (Signed)
Low carb diet and exercise.  Follow met b and a1c.   

## 2022-05-30 NOTE — Assessment & Plan Note (Signed)
Continue micardis.  Blood pressure doing well.  Follow pressures.  Follow metabolic panel.   

## 2022-05-30 NOTE — Progress Notes (Signed)
Subjective:    Patient ID: Kaitlin Dean, female    DOB: 1945-08-01, 77 y.o.   MRN: [id number]  Patient here for  Chief Complaint  Patient presents with   Medical Management of Chronic Issues    HPI Here to follow up regarding recent abdominal pain, GERD, hypercholesterolemia and hypertension.  She was just seen last week for abdominal pain.  Concern regarding possible diverticulitis.  Symptoms improved.  No increased pain now.  She has adjusted her diet.  More bland foods now.  Reflux is better.  Taking one protonix per day now and tolerating.  Still with some issues with her bowels.  Discussed benefiber.  Scheduled for CT today.  In the process of trying to get an appt with GI.  No chest pain or sob reported.  No cough or congestion.     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)    Shoulder fracture, right 11/07/2018   Skin cancer    Past Surgical History:  Procedure Laterality Date   BREAST EXCISIONAL BIOPSY Right 2000   benign   BREAST SURGERY  2000   Biopsy   CATARACT EXTRACTION W/PHACO Right 12/05/2016   Procedure: CATARACT EXTRACTION PHACO AND INTRAOCULAR LENS PLACEMENT (IOC) RIGHT SYMFONY LENS;  Surgeon: Lockie Mola, MD;  Location: [facility];  Service: Ophthalmology;  Laterality: Right;   CATARACT EXTRACTION W/PHACO Left 02/06/2017   Procedure: CATARACT EXTRACTION PHACO AND INTRAOCULAR LENS PLACEMENT (IOC) LEFT SYMFONY LENS;  Surgeon:  Lockie Mola, MD;  Location: [facility];  Service: Ophthalmology;  Laterality: Left;   COLONOSCOPY     ESOPHAGOGASTRODUODENOSCOPY     ESOPHAGOGASTRODUODENOSCOPY (EGD) WITH PROPOFOL N/A 06/11/2016   Procedure: ESOPHAGOGASTRODUODENOSCOPY (EGD) WITH PROPOFOL;  Surgeon: Scot Jun, MD;  Location: [facility];  Service: Endoscopy;  Laterality: N/A;   ESOPHAGOGASTRODUODENOSCOPY (EGD) WITH PROPOFOL N/A 05/25/2019   Procedure: ESOPHAGOGASTRODUODENOSCOPY (EGD) WITH BIOPSY;  Surgeon: Midge Minium, MD;  Location: [facility];  Service: Endoscopy;  Laterality: N/A;  Priority 3   JOINT REPLACEMENT Left    knee   PTOSIS REPAIR Bilateral 06/01/2014   Procedure: PTOSIS REPAIR;  Surgeon: Imagene Riches, MD;  Location: [facility];  Service: Ophthalmology;  Laterality: Bilateral;   SKIN CANCER EXCISION     TOTAL KNEE ARTHROPLASTY Left 04/30/2012   Procedure: TOTAL KNEE ARTHROPLASTY;  Surgeon: Loanne Drilling, MD;  Location: [facility];  Service: Orthopedics;  Laterality: Left;   TUBAL LIGATION     Family History  Problem Relation Age of Onset   Hypertension Mother    Congestive Heart Failure Mother    Stroke Father    Stroke Brother    Heart failure Brother    Sarcoidosis Sister        died of kidney failure   Breast cancer Neg Hx    Colon cancer Neg Hx  Social History   Socioeconomic History   Marital status: Widowed    Spouse name: Not on file   Number of children: 2   Years of education: Not on file   Highest education level: Bachelor's degree (e.g., BA, AB, BS)  Occupational History    Comment: Retired Insurance account manager  Tobacco Use   Smoking status: Never   Smokeless tobacco: Never  Vaping Use   Vaping Use: Never used  Substance and Sexual Activity   Alcohol use: Yes    Alcohol/week: 1.0 standard drink of alcohol    Types: 1 Glasses of wine per week    Comment: SOCIALLY   Drug use: No   Sexual activity: Not Currently  Other Topics Concern   Not  on file  Social History Narrative   Patient is a widow and she is a Buyer, retail of [employer]   Retired Microbiologist in the [employer]   1 son and 1 daughter, 1 son is a Forensic scientist working for [employer] and the daughter lives in [place]   Rare alcohol non-smoker   Social Determinants of Health   Financial Resource Strain: Low Risk  (05/23/2022)   Overall Financial Resource Strain (CARDIA)    Difficulty of Paying Living Expenses: Not hard at all  Food Insecurity: No Food Insecurity (05/23/2022)   Hunger Vital Sign    Worried About Running Out of Food in the Last Year: Never true    Ran Out of Food in the Last Year: Never true  Transportation Needs: No Transportation Needs (05/23/2022)   PRAPARE - Administrator, Civil Service (Medical): No    Lack of Transportation (Non-Medical): No  Physical Activity: Sufficiently Active (05/23/2022)   Exercise Vital Sign    Days of Exercise per Week: 5 days    Minutes of Exercise per Session: 60 min  Stress: No Stress Concern Present (05/23/2022)   [employer] of Occupational Health - Occupational Stress Questionnaire    Feeling of Stress : Not at all  Social Connections: Moderately Integrated (05/23/2022)   Social Connection and Isolation Panel [NHANES]    Frequency of Communication with Friends and Family: More than three times a week    Frequency of Social Gatherings with Friends and Family: More than three times a week    Attends Religious Services: More than 4 times per year    Active Member of [employer] or Organizations: Yes    Attends Banker Meetings: More than 4 times per year    Marital Status: Widowed     Review of Systems  Constitutional:  Negative for appetite change and unexpected weight change.  HENT:  Negative for congestion and sinus pressure.   Respiratory:  Negative for cough, chest tightness and shortness of breath.   Cardiovascular:  Negative for chest pain and  palpitations.  Gastrointestinal:  Negative for abdominal pain, diarrhea, nausea and vomiting.  Genitourinary:  Negative for difficulty urinating and dysuria.  Musculoskeletal:  Negative for joint swelling and myalgias.  Skin:  Negative for color change and rash.  Neurological:  Negative for dizziness and headaches.  Psychiatric/Behavioral:  Negative for agitation and dysphoric mood.        Objective:     BP 120/68   Pulse 87   Temp 98 F (36.7 C)   Resp 16   Ht 5\' 3"  (1.6 m)   Wt 183 lb 6.4 oz (83.2 kg)   LMP 01/29/1993   SpO2 98%   BMI 32.49  kg/m  Wt Readings from Last 3 Encounters:  05/30/22 183 lb 6.4 oz (83.2 kg)  05/24/22 184 lb (83.5 kg)  03/30/22 186 lb 12.8 oz (84.7 kg)    Physical Exam Vitals reviewed.  Constitutional:      General: She is not in acute distress.    Appearance: Normal appearance.  HENT:     Head: Normocephalic and atraumatic.     Right Ear: External ear normal.     Left Ear: External ear normal.  Eyes:     General: No scleral icterus.       Right eye: No discharge.        Left eye: No discharge.     Conjunctiva/sclera: Conjunctivae normal.  Neck:     Thyroid: No thyromegaly.  Cardiovascular:     Rate and Rhythm: Normal rate and regular rhythm.  Pulmonary:     Effort: No respiratory distress.     Breath sounds: Normal breath sounds. No wheezing.  Abdominal:     General: Bowel sounds are normal.     Palpations: Abdomen is soft.     Tenderness: There is no abdominal tenderness.  Musculoskeletal:        General: No swelling or tenderness.     Cervical back: Neck supple. No tenderness.  Lymphadenopathy:     Cervical: No cervical adenopathy.  Skin:    Findings: No erythema or rash.  Neurological:     Mental Status: She is alert.  Psychiatric:        Mood and Affect: Mood normal.        Behavior: Behavior normal.      Outpatient Encounter Medications as of 05/30/2022  Medication Sig   albuterol (VENTOLIN HFA) 108 (90 Base)  MCG/ACT inhaler Inhale 2 puffs into the lungs every 6 (six) hours as needed for wheezing or shortness of breath.   Cholecalciferol (VITAMIN D3 PO) Take by mouth daily.   ezetimibe-simvastatin (VYTORIN) 10-40 MG tablet Take 1 tablet by mouth daily.   Multiple Vitamin (MULTIVITAMIN) capsule Take 1 capsule by mouth daily. AM   pantoprazole (PROTONIX) 40 MG tablet Take 1 tablet (40 mg total) by mouth 2 (two) times daily before a meal.   telmisartan (MICARDIS) 40 MG tablet Take 1 tablet (40 mg total) by mouth every morning.   vitamin B-12 (CYANOCOBALAMIN) 1000 MCG tablet Take 1,000 mcg by mouth daily.   Vitamin E 400 units TABS Take by mouth daily.   No facility-administered encounter medications on file as of 05/30/2022.     Lab Results  Component Value Date   WBC 3.9 (L) 05/24/2022   HGB 14.6 05/24/2022   HCT 43.8 05/24/2022   PLT 245.0 05/24/2022   GLUCOSE 124 (H) 05/24/2022   CHOL 125 03/30/2022   TRIG 121.0 03/30/2022   HDL 35.90 (L) 03/30/2022   LDLDIRECT 182.4 02/05/2012   LDLCALC 65 03/30/2022   ALT 31 05/24/2022   AST 30 05/24/2022   NA 142 05/24/2022   K 4.1 05/24/2022   CL 106 05/24/2022   CREATININE 0.92 05/24/2022   BUN 12 05/24/2022   CO2 26 05/24/2022   TSH 3.22 11/28/2021   INR 0.99 04/22/2012   HGBA1C 6.3 03/30/2022   MICROALBUR <0.7 06/07/2021    MM 3D SCREEN BREAST BILATERAL  Result Date: 12/06/2021 CLINICAL DATA:  Screening. EXAM: DIGITAL SCREENING BILATERAL MAMMOGRAM WITH TOMOSYNTHESIS AND CAD TECHNIQUE: Bilateral screening digital craniocaudal and mediolateral oblique mammograms were obtained. Bilateral screening digital breast tomosynthesis was performed. The images were evaluated with  computer-aided detection. COMPARISON:  Previous exam(s). ACR Breast Density Category b: There are scattered areas of fibroglandular density. FINDINGS: There are no findings suspicious for malignancy. IMPRESSION: No mammographic evidence of malignancy. A result letter of this  screening mammogram will be mailed directly to the patient. RECOMMENDATION: Screening mammogram in one year. (Code:SM-B-01Y) BI-RADS CATEGORY  1: Negative. Electronically Signed   By: Baird Lyons M.D.   On: 12/06/2021 16:14       Assessment & Plan:  Left lower quadrant abdominal pain Assessment & Plan: Previous increased abdominal pain - localized left lower quadrant, nausea and some emesis - regurgitation of food. Has history of diverticulitis.  Has adjusted diet.  Eating bland foods.  Staying hydrated.  Pain is better.  Benefiber to keep bowels moving.  Acid reflux and dysphagia - resolved on bid protonix and since adjusting diet, symptoms controlled on daily protonix. Scheduled for CT abd/pelvis today. Also refer to GI.  Last colonoscopy 2015 - diverticulosis, internal hemorrhoids.     Aortic atherosclerosis (HCC) Assessment & Plan: Continue vytorin.     Barrett's esophagus without dysplasia Assessment & Plan: EGD 04/2019.  Previously followed by GI - Dr Servando Snare. On protonix.  Upper symptoms controlled on protonix as outlined.  Continue.  Plan GI evaluation as outlined.  Request appt with Dr Dulce Sellar.    Hiatal hernia with GERD Assessment & Plan: Upper symptoms controlled with diet adjustment and daily protonix. GI evaluation as outlined    Hypercholesterolemia Assessment & Plan: Continue vytorin.  Low cholesterol diet and exercise. Follow lipid panel and liver function tests.    Orders: -     CBC with Differential/Platelet; Future -     Lipid panel; Future -     Hepatic function panel; Future  Hyperglycemia Assessment & Plan: Low carb diet and exercise.  Follow met b and a1c.   Orders: -     Basic metabolic panel; Future -     Hemoglobin A1c; Future  Primary hypertension Assessment & Plan: Continue micardis.  Blood pressure doing well.  Follow pressures.  Follow metabolic panel.    Orders: -     Basic metabolic panel; Future  Type 2 diabetes mellitus with hyperglycemia,  without long-term current use of insulin (HCC) Assessment & Plan: Low carb diet and exercise. Has adjusted diet.  Follow met b and a1c.       Dale Margaret, MD

## 2022-08-27 ENCOUNTER — Other Ambulatory Visit (INDEPENDENT_AMBULATORY_CARE_PROVIDER_SITE_OTHER): Payer: Medicare PPO

## 2022-08-27 DIAGNOSIS — I10 Essential (primary) hypertension: Secondary | ICD-10-CM | POA: Diagnosis not present

## 2022-08-27 DIAGNOSIS — R739 Hyperglycemia, unspecified: Secondary | ICD-10-CM | POA: Diagnosis not present

## 2022-08-27 DIAGNOSIS — E7800 Pure hypercholesterolemia, unspecified: Secondary | ICD-10-CM | POA: Diagnosis not present

## 2022-08-27 LAB — BASIC METABOLIC PANEL
BUN: 22 mg/dL (ref 6–23)
CO2: 26 mEq/L (ref 19–32)
Calcium: 9.6 mg/dL (ref 8.4–10.5)
Chloride: 104 mEq/L (ref 96–112)
Creatinine, Ser: 0.97 mg/dL (ref 0.40–1.20)
GFR: 55.62 mL/min — ABNORMAL LOW (ref >60.00)
Glucose, Bld: 130 mg/dL — ABNORMAL HIGH (ref 70–99)
Potassium: 4.2 mEq/L (ref 3.5–5.1)
Sodium: 137 mEq/L (ref 135–145)

## 2022-08-27 LAB — LIPID PANEL
Cholesterol: 134 mg/dL (ref 0–200)
HDL: 43.8 mg/dL (ref >39.00)
LDL Cholesterol: 72 mg/dL (ref 0–99)
NonHDL: 90.12
Total CHOL/HDL Ratio: 3
Triglycerides: 93 mg/dL (ref 0.0–149.0)
VLDL: 18.6 mg/dL (ref 0.0–40.0)

## 2022-08-27 LAB — CBC WITH DIFFERENTIAL/PLATELET
Basophils Absolute: 0 10*3/uL (ref 0.0–0.1)
Basophils Relative: 0.4 % (ref 0.0–3.0)
Eosinophils Absolute: 0.2 10*3/uL (ref 0.0–0.7)
Eosinophils Relative: 3.9 % (ref 0.0–5.0)
HCT: 43 % (ref 36.0–46.0)
Hemoglobin: 14.2 g/dL (ref 12.0–15.0)
Lymphocytes Relative: 28.6 % (ref 12.0–46.0)
Lymphs Abs: 1.4 10*3/uL (ref 0.7–4.0)
MCHC: 33 g/dL (ref 30.0–36.0)
MCV: 92.5 fl (ref 78.0–100.0)
Monocytes Absolute: 0.4 10*3/uL (ref 0.1–1.0)
Monocytes Relative: 7.7 % (ref 3.0–12.0)
Neutro Abs: 3 10*3/uL (ref 1.4–7.7)
Neutrophils Relative %: 59.4 % (ref 43.0–77.0)
Platelets: 255 10*3/uL (ref 150.0–400.0)
RBC: 4.65 Mil/uL (ref 3.87–5.11)
RDW: 14.1 % (ref 11.5–15.5)
WBC: 5 10*3/uL (ref 4.0–10.5)

## 2022-08-27 LAB — HEPATIC FUNCTION PANEL
ALT: 31 U/L (ref 0–35)
AST: 30 U/L (ref 0–37)
Albumin: 4.4 g/dL (ref 3.5–5.2)
Alkaline Phosphatase: 87 U/L (ref 39–117)
Bilirubin, Direct: 0.1 mg/dL (ref 0.0–0.3)
Total Bilirubin: 0.9 mg/dL (ref 0.2–1.2)
Total Protein: 6.9 g/dL (ref 6.0–8.3)

## 2022-08-27 LAB — HEMOGLOBIN A1C: Hgb A1c MFr Bld: 6.3 % (ref 4.6–6.5)

## 2022-08-30 ENCOUNTER — Encounter: Payer: Self-pay | Admitting: Internal Medicine

## 2022-08-30 ENCOUNTER — Ambulatory Visit: Payer: Medicare PPO | Admitting: Internal Medicine

## 2022-08-30 VITALS — BP 114/70 | HR 86 | Temp 98.2°F | Resp 16 | Ht 63.0 in | Wt 183.0 lb

## 2022-08-30 DIAGNOSIS — E1165 Type 2 diabetes mellitus with hyperglycemia: Secondary | ICD-10-CM

## 2022-08-30 DIAGNOSIS — I10 Essential (primary) hypertension: Secondary | ICD-10-CM | POA: Diagnosis not present

## 2022-08-30 DIAGNOSIS — K2270 Barrett's esophagus without dysplasia: Secondary | ICD-10-CM

## 2022-08-30 DIAGNOSIS — K5730 Diverticulosis of large intestine without perforation or abscess without bleeding: Secondary | ICD-10-CM

## 2022-08-30 DIAGNOSIS — E7800 Pure hypercholesterolemia, unspecified: Secondary | ICD-10-CM | POA: Diagnosis not present

## 2022-08-30 DIAGNOSIS — R739 Hyperglycemia, unspecified: Secondary | ICD-10-CM | POA: Diagnosis not present

## 2022-08-30 DIAGNOSIS — R058 Other specified cough: Secondary | ICD-10-CM | POA: Diagnosis not present

## 2022-08-30 DIAGNOSIS — R1032 Left lower quadrant pain: Secondary | ICD-10-CM

## 2022-08-30 DIAGNOSIS — Z1231 Encounter for screening mammogram for malignant neoplasm of breast: Secondary | ICD-10-CM

## 2022-08-30 DIAGNOSIS — I700 Atherosclerosis of aorta: Secondary | ICD-10-CM

## 2022-08-30 MED ORDER — TELMISARTAN 40 MG PO TABS: 40.0000 mg | ORAL_TABLET | Freq: Every morning | ORAL | 1 refills | Status: DC

## 2022-08-30 MED ORDER — EZETIMIBE-SIMVASTATIN 10-40 MG PO TABS: 1.0000 | ORAL_TABLET | Freq: Every day | ORAL | 1 refills | Status: DC

## 2022-08-30 MED ORDER — ALBUTEROL SULFATE HFA 108 (90 BASE) MCG/ACT IN AERS
2.0000 | INHALATION_SPRAY | Freq: Four times a day (QID) | RESPIRATORY_TRACT | 0 refills | Status: DC | PRN
Start: 2022-08-30 — End: 2023-05-06

## 2022-08-30 NOTE — Progress Notes (Signed)
Subjective:    Patient ID: Kaitlin Dean, female    DOB: 1945/08/21, 77 y.o.   MRN: [id number]  Patient here for  Chief Complaint  Patient presents with   Medical Management of Chronic Issues    HPI Here to follow up regarding recent abdominal pain, GERD, hypercholesterolemia and hypertension. Continues on protonix. Recently evaluated for LLQ abdominal pain.  CT- No acute findings in the abdomen or pelvis. Specifically, no findings to explain the patient's history of left lower quadrant pain. Diffuse colonic diverticulosis without diverticulitis. Moderate stool volume. Imaging features could be compatible with clinical constipation. Small to moderate hiatal hernia.  Had an appt scheduled with GI office.  Elected to hold on appt. Pain is better.  Bowels better.  No abdominal pain.  No nausea or vomiting.  Eating.  Stays active.  No chest pain or sob reported. No cough or congestion.  Trouble sleeping.  Watches news prior to bed.  Some nasal stuffiness affecting.  Not every night.  Does feel rested when does get a good night sleep.    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;  Service: Ophthalmology;  Laterality: Right;   CATARACT EXTRACTION W/PHACO Left 02/06/2017   Procedure: CATARACT EXTRACTION PHACO AND INTRAOCULAR LENS PLACEMENT (IOC) LEFT SYMFONY LENS;  Surgeon: Lockie Mola, MD;  Location: [facility];  Service: Ophthalmology;  Laterality: Left;   COLONOSCOPY     ESOPHAGOGASTRODUODENOSCOPY     ESOPHAGOGASTRODUODENOSCOPY (EGD) WITH PROPOFOL N/A 06/11/2016   Procedure: ESOPHAGOGASTRODUODENOSCOPY (EGD) WITH PROPOFOL;  Surgeon: Scot Jun, MD;  Location: [facility] ENDOSCOPY;  Service: Endoscopy;  Laterality: N/A;   ESOPHAGOGASTRODUODENOSCOPY (EGD) WITH PROPOFOL N/A 05/25/2019   Procedure: ESOPHAGOGASTRODUODENOSCOPY (EGD) WITH BIOPSY;  Surgeon: Midge Minium, MD;  Location: [facility];  Service: Endoscopy;  Laterality: N/A;  Priority 3   JOINT REPLACEMENT Left    knee   PTOSIS REPAIR Bilateral 06/01/2014   Procedure: PTOSIS REPAIR;  Surgeon: Imagene Riches, MD;  Location: [facility];  Service: Ophthalmology;  Laterality: Bilateral;   SKIN CANCER EXCISION     TOTAL KNEE ARTHROPLASTY Left 04/30/2012   Procedure: TOTAL KNEE ARTHROPLASTY;  Surgeon: Loanne Drilling, MD;  Location: [facility];  Service: Orthopedics;  Laterality: Left;   TUBAL LIGATION     Family History  Problem Relation Age of Onset   Hypertension Mother    Congestive Heart Failure Mother    Stroke Father  Stroke Brother    Heart failure Brother    Sarcoidosis Sister        died of kidney failure   Breast cancer Neg Hx    Colon cancer Neg Hx    Social History   Socioeconomic History   Marital status: Widowed    Spouse name: Not on file   Number of children: 2   Years of education: Not on file   Highest education level: Bachelor's degree (e.g., BA, AB, BS)  Occupational History    Comment: Retired Insurance account manager  Tobacco Use   Smoking status: Never   Smokeless  tobacco: Never  Vaping Use   Vaping status: Never Used  Substance and Sexual Activity   Alcohol use: Yes    Alcohol/week: 1.0 standard drink of alcohol    Types: 1 Glasses of wine per week    Comment: SOCIALLY   Drug use: No   Sexual activity: Not Currently  Other Topics Concern   Not on file  Social History Narrative   Patient is a widow and she is a Buyer, retail of [employer]   Retired Microbiologist in the [employer]   1 son and 1 daughter, 1 son is a Forensic scientist working for [employer] and the daughter lives in [place]   Rare alcohol non-smoker   Social Determinants of Health   Financial Resource Strain: Low Risk  (05/23/2022)   Overall Financial Resource Strain (CARDIA)    Difficulty of Paying Living Expenses: Not hard at all  Food Insecurity: No Food Insecurity (05/23/2022)   Hunger Vital Sign    Worried About Running Out of Food in the Last Year: Never true    Ran Out of Food in the Last Year: Never true  Transportation Needs: No Transportation Needs (05/23/2022)   PRAPARE - Administrator, Civil Service (Medical): No    Lack of Transportation (Non-Medical): No  Physical Activity: Sufficiently Active (05/23/2022)   Exercise Vital Sign    Days of Exercise per Week: 5 days    Minutes of Exercise per Session: 60 min  Stress: No Stress Concern Present (05/23/2022)   [employer] of Occupational Health - Occupational Stress Questionnaire    Feeling of Stress : Not at all  Social Connections: Moderately Integrated (05/23/2022)   Social Connection and Isolation Panel [NHANES]    Frequency of Communication with Friends and Family: More than three times a week    Frequency of Social Gatherings with Friends and Family: More than three times a week    Attends Religious Services: More than 4 times per year    Active Member of [employer] or Organizations: Yes    Attends Banker Meetings: More than 4 times per year    Marital Status:  Widowed     Review of Systems  Constitutional:  Negative for appetite change and unexpected weight change.  HENT:  Negative for congestion and sinus pressure.   Respiratory:  Negative for cough, chest tightness and shortness of breath.   Cardiovascular:  Negative for chest pain, palpitations and leg swelling.  Gastrointestinal:  Negative for diarrhea, nausea and vomiting.       No abdominal pain better.   Genitourinary:  Negative for difficulty urinating and dysuria.  Musculoskeletal:  Negative for joint swelling and myalgias.  Skin:  Negative for color change and rash.  Neurological:  Negative for dizziness and headaches.  Psychiatric/Behavioral:  Negative for agitation and dysphoric mood.  Objective:     BP 114/70   Pulse 86   Temp 98.2 F (36.8 C)   Resp 16   Ht 5\' 3"  (1.6 m)   Wt 183 lb (83 kg)   LMP 01/29/1993   SpO2 98%   BMI 32.42 kg/m  Wt Readings from Last 3 Encounters:  08/30/22 183 lb (83 kg)  05/30/22 183 lb 6.4 oz (83.2 kg)  05/24/22 184 lb (83.5 kg)    Physical Exam Vitals reviewed.  Constitutional:      General: She is not in acute distress.    Appearance: Normal appearance.  HENT:     Head: Normocephalic and atraumatic.     Right Ear: External ear normal.     Left Ear: External ear normal.  Eyes:     General: No scleral icterus.       Right eye: No discharge.        Left eye: No discharge.     Conjunctiva/sclera: Conjunctivae normal.  Neck:     Thyroid: No thyromegaly.  Cardiovascular:     Rate and Rhythm: Normal rate and regular rhythm.  Pulmonary:     Effort: No respiratory distress.     Breath sounds: Normal breath sounds. No wheezing.  Abdominal:     General: Bowel sounds are normal.     Palpations: Abdomen is soft.     Tenderness: There is no abdominal tenderness.  Musculoskeletal:        General: No swelling or tenderness.     Cervical back: Neck supple. No tenderness.  Lymphadenopathy:     Cervical: No cervical  adenopathy.  Skin:    Findings: No erythema or rash.  Neurological:     Mental Status: She is alert.  Psychiatric:        Mood and Affect: Mood normal.        Behavior: Behavior normal.      Outpatient Encounter Medications as of 08/30/2022  Medication Sig   albuterol (VENTOLIN HFA) 108 (90 Base) MCG/ACT inhaler Inhale 2 puffs into the lungs every 6 (six) hours as needed for wheezing or shortness of breath.   Cholecalciferol (VITAMIN D3 PO) Take by mouth daily.   ezetimibe-simvastatin (VYTORIN) 10-40 MG tablet Take 1 tablet by mouth daily.   Multiple Vitamin (MULTIVITAMIN) capsule Take 1 capsule by mouth daily. AM   pantoprazole (PROTONIX) 40 MG tablet Take 1 tablet (40 mg total) by mouth 2 (two) times daily before a meal.   telmisartan (MICARDIS) 40 MG tablet Take 1 tablet (40 mg total) by mouth every morning.   vitamin B-12 (CYANOCOBALAMIN) 1000 MCG tablet Take 1,000 mcg by mouth daily.   Vitamin E 400 units TABS Take by mouth daily.   [DISCONTINUED] albuterol (VENTOLIN HFA) 108 (90 Base) MCG/ACT inhaler Inhale 2 puffs into the lungs every 6 (six) hours as needed for wheezing or shortness of breath.   [DISCONTINUED] ezetimibe-simvastatin (VYTORIN) 10-40 MG tablet Take 1 tablet by mouth daily.   [DISCONTINUED] telmisartan (MICARDIS) 40 MG tablet Take 1 tablet (40 mg total) by mouth every morning.   No facility-administered encounter medications on file as of 08/30/2022.     Lab Results  Component Value Date   WBC 5.0 08/27/2022   HGB 14.2 08/27/2022   HCT 43.0 08/27/2022   PLT 255.0 08/27/2022   GLUCOSE 130 (H) 08/27/2022   CHOL 134 08/27/2022   TRIG 93.0 08/27/2022   HDL 43.80 08/27/2022   LDLDIRECT 182.4 02/05/2012   LDLCALC 72 08/27/2022   ALT  31 08/27/2022   AST 30 08/27/2022   NA 137 08/27/2022   K 4.2 08/27/2022   CL 104 08/27/2022   CREATININE 0.97 08/27/2022   BUN 22 08/27/2022   CO2 26 08/27/2022   TSH 3.22 11/28/2021   INR 0.99 04/22/2012   HGBA1C 6.3  08/27/2022   MICROALBUR <0.7 06/07/2021    CT Abdomen Pelvis W Contrast  Result Date: 06/03/2022 CLINICAL DATA:  Left lower quadrant pain. EXAM: CT ABDOMEN AND PELVIS WITH CONTRAST TECHNIQUE: Multidetector CT imaging of the abdomen and pelvis was performed using the standard protocol following bolus administration of intravenous contrast. RADIATION DOSE REDUCTION: This exam was performed according to the departmental dose-optimization program which includes automated exposure control, adjustment of the mA and/or kV according to patient size and/or use of iterative reconstruction technique. CONTRAST:  OMNIPAQUE IOHEXOL 300 MG/ML  SOLN COMPARISON:  08/27/2016 FINDINGS: Lower chest: Stable tiny right middle lobe pulmonary nodule consistent with benign etiology. No followup imaging is recommended. Architectural distortion/scarring noted in lung bases bilaterally. Small to moderate hiatal hernia. Hepatobiliary: No suspicious focal abnormality within the liver parenchyma. There is no evidence for gallstones, gallbladder wall thickening, or pericholecystic fluid. No intrahepatic or extrahepatic biliary dilation. Pancreas: No focal mass lesion. No dilatation of the main duct. No intraparenchymal cyst. No peripancreatic edema. Spleen: No splenomegaly. No focal mass lesion. Adrenals/Urinary Tract: No adrenal nodule or mass. Central sinus cysts again noted in both kidneys. No followup imaging is recommended. No evidence for hydroureter. The urinary bladder appears normal for the degree of distention. Stomach/Bowel: Small to moderate hiatal hernia. Stomach otherwise unremarkable. Duodenum is normally positioned as is the ligament of Treitz. No small bowel wall thickening. No small bowel dilatation. The terminal ileum is normal. The appendix is normal. Diverticuli are seen scattered along the entire length of the colon without CT findings of diverticulitis. Moderate stool volume evident. Vascular/Lymphatic: There is  moderate atherosclerotic calcification of the abdominal aorta without aneurysm. There is no gastrohepatic or hepatoduodenal ligament lymphadenopathy. No retroperitoneal or mesenteric lymphadenopathy. No pelvic sidewall lymphadenopathy. Reproductive: Unremarkable. Other: No intraperitoneal free fluid. Musculoskeletal: Old inferior and superior pubic ramus fractures noted bilaterally. No worrisome lytic or sclerotic osseous abnormality. Stable superior endplate compression deformity at L3. IMPRESSION: 1. No acute findings in the abdomen or pelvis. Specifically, no findings to explain the patient's history of left lower quadrant pain. 2. Diffuse colonic diverticulosis without diverticulitis. 3. Moderate stool volume. Imaging features could be compatible with clinical constipation. 4. Small to moderate hiatal hernia. 5.  Aortic Atherosclerosis (ICD10-I70.0). Electronically Signed   By: Kennith Center M.D.   On: 06/03/2022 08:38       Assessment & Plan:  Hypercholesterolemia Assessment & Plan: Continue vytorin.  Low cholesterol diet and exercise. Follow lipid panel and liver function tests.    Orders: -     Lipid panel; Future -     Hepatic function panel; Future -     Basic metabolic panel; Future -     TSH; Future  Hyperglycemia Assessment & Plan: Low carb diet and exercise.  Follow met b and a1c.    Type 2 diabetes mellitus with hyperglycemia, without long-term current use of insulin (HCC) Assessment & Plan: Low carb diet and exercise. Has adjusted diet.  Follow met b and a1c.   Orders: -     Hemoglobin A1c; Future -     Microalbumin / creatinine urine ratio; Future  Visit for screening mammogram -     3D Screening  Mammogram, Left and Right; Future  Productive cough -     Albuterol Sulfate HFA; Inhale 2 puffs into the lungs every 6 (six) hours as needed for wheezing or shortness of breath.  Dispense: 1 each; Refill: 0  Left lower quadrant abdominal pain Assessment & Plan: Abdominal  pain - resolved. Keep bowels moving.  Monitor for diet triggers.    Aortic atherosclerosis (HCC) Assessment & Plan: Continue vytorin.     Barrett's esophagus without dysplasia Assessment & Plan: EGD 04/2019.  Previously followed by GI - Dr Servando Snare. On protonix.  Upper symptoms controlled on protonix as outlined.  Continue.  No symptoms now.  Does not feel needs f/u with GI at this time.    Diverticulosis of colon without hemorrhage Assessment & Plan: Recent CT and colonoscopy as outlined in overview.  Recommended f/u colonoscopy in 10 years (due 10/2023).  Currently asymptomatic.  No abdominal pain.     Primary hypertension Assessment & Plan: Continue micardis.  Blood pressure doing well.  Follow pressures.  Follow metabolic panel.     Other orders -     Ezetimibe-Simvastatin; Take 1 tablet by mouth daily.  Dispense: 90 tablet; Refill: 1 -     Telmisartan; Take 1 tablet (40 mg total) by mouth every morning.  Dispense: 90 tablet; Refill: 1     Dale Miltona, MD

## 2022-08-30 NOTE — Patient Instructions (Signed)
Saline nasal spray - flush nose before bed.   If persistent problems, can also try nasacort nasal spray - 2 sprays each nostril one time per day.

## 2022-09-01 ENCOUNTER — Encounter: Payer: Self-pay | Admitting: Internal Medicine

## 2022-09-01 NOTE — Assessment & Plan Note (Signed)
Continue vytorin.   

## 2022-09-01 NOTE — Assessment & Plan Note (Signed)
Recent CT and colonoscopy as outlined in overview.  Recommended f/u colonoscopy in 10 years (due 10/2023).  Currently asymptomatic.  No abdominal pain.

## 2022-09-01 NOTE — Assessment & Plan Note (Signed)
Low carb diet and exercise.  Follow met b and a1c.  

## 2022-09-01 NOTE — Assessment & Plan Note (Signed)
Low carb diet and exercise.  Has adjusted diet.  Follow met b and a1c.   

## 2022-09-01 NOTE — Assessment & Plan Note (Signed)
Continue micardis.  Blood pressure doing well.  Follow pressures.  Follow metabolic panel.   

## 2022-09-01 NOTE — Assessment & Plan Note (Signed)
Continue vytorin.  Low cholesterol diet and exercise. Follow lipid panel and liver function tests.   

## 2022-09-01 NOTE — Assessment & Plan Note (Signed)
EGD 04/2019.  Previously followed by GI - Dr Servando Snare. On protonix.  Upper symptoms controlled on protonix as outlined.  Continue.  No symptoms now.  Does not feel needs f/u with GI at this time.

## 2022-09-01 NOTE — Assessment & Plan Note (Signed)
Abdominal pain - resolved. Keep bowels moving.  Monitor for diet triggers.

## 2022-10-10 DIAGNOSIS — D2272 Melanocytic nevi of left lower limb, including hip: Secondary | ICD-10-CM | POA: Diagnosis not present

## 2022-10-10 DIAGNOSIS — C44712 Basal cell carcinoma of skin of right lower limb, including hip: Secondary | ICD-10-CM | POA: Diagnosis not present

## 2022-10-10 DIAGNOSIS — D2261 Melanocytic nevi of right upper limb, including shoulder: Secondary | ICD-10-CM | POA: Diagnosis not present

## 2022-10-10 DIAGNOSIS — S80862A Insect bite (nonvenomous), left lower leg, initial encounter: Secondary | ICD-10-CM | POA: Diagnosis not present

## 2022-10-10 DIAGNOSIS — D485 Neoplasm of uncertain behavior of skin: Secondary | ICD-10-CM | POA: Diagnosis not present

## 2022-10-10 DIAGNOSIS — Z85828 Personal history of other malignant neoplasm of skin: Secondary | ICD-10-CM | POA: Diagnosis not present

## 2022-10-10 DIAGNOSIS — D225 Melanocytic nevi of trunk: Secondary | ICD-10-CM | POA: Diagnosis not present

## 2022-10-10 DIAGNOSIS — D2262 Melanocytic nevi of left upper limb, including shoulder: Secondary | ICD-10-CM | POA: Diagnosis not present

## 2022-10-10 DIAGNOSIS — D2271 Melanocytic nevi of right lower limb, including hip: Secondary | ICD-10-CM | POA: Diagnosis not present

## 2022-10-10 DIAGNOSIS — D044 Carcinoma in situ of skin of scalp and neck: Secondary | ICD-10-CM | POA: Diagnosis not present

## 2022-11-28 DIAGNOSIS — D045 Carcinoma in situ of skin of trunk: Secondary | ICD-10-CM | POA: Diagnosis not present

## 2022-11-28 DIAGNOSIS — C44712 Basal cell carcinoma of skin of right lower limb, including hip: Secondary | ICD-10-CM | POA: Diagnosis not present

## 2022-12-07 ENCOUNTER — Ambulatory Visit
Admission: RE | Admit: 2022-12-07 | Discharge: 2022-12-07 | Disposition: A | Discharge status: Home / Self Care | Payer: Medicare PPO | Source: Ambulatory Visit | Attending: Internal Medicine | Admitting: Internal Medicine

## 2022-12-07 DIAGNOSIS — Z1231 Encounter for screening mammogram for malignant neoplasm of breast: Secondary | ICD-10-CM | POA: Diagnosis not present

## 2022-12-18 DIAGNOSIS — Z8249 Family history of ischemic heart disease and other diseases of the circulatory system: Secondary | ICD-10-CM | POA: Diagnosis not present

## 2022-12-18 DIAGNOSIS — E669 Obesity, unspecified: Secondary | ICD-10-CM | POA: Diagnosis not present

## 2022-12-18 DIAGNOSIS — E785 Hyperlipidemia, unspecified: Secondary | ICD-10-CM | POA: Diagnosis not present

## 2022-12-18 DIAGNOSIS — Z85828 Personal history of other malignant neoplasm of skin: Secondary | ICD-10-CM | POA: Diagnosis not present

## 2022-12-18 DIAGNOSIS — M810 Age-related osteoporosis without current pathological fracture: Secondary | ICD-10-CM | POA: Diagnosis not present

## 2022-12-18 DIAGNOSIS — Z823 Family history of stroke: Secondary | ICD-10-CM | POA: Diagnosis not present

## 2022-12-18 DIAGNOSIS — K2270 Barrett's esophagus without dysplasia: Secondary | ICD-10-CM | POA: Diagnosis not present

## 2022-12-18 DIAGNOSIS — I10 Essential (primary) hypertension: Secondary | ICD-10-CM | POA: Diagnosis not present

## 2022-12-18 DIAGNOSIS — K219 Gastro-esophageal reflux disease without esophagitis: Secondary | ICD-10-CM | POA: Diagnosis not present

## 2023-01-01 ENCOUNTER — Other Ambulatory Visit (INDEPENDENT_AMBULATORY_CARE_PROVIDER_SITE_OTHER): Payer: Medicare PPO

## 2023-01-01 DIAGNOSIS — E1165 Type 2 diabetes mellitus with hyperglycemia: Secondary | ICD-10-CM

## 2023-01-01 DIAGNOSIS — E7800 Pure hypercholesterolemia, unspecified: Secondary | ICD-10-CM | POA: Diagnosis not present

## 2023-01-01 LAB — HEPATIC FUNCTION PANEL
ALT: 36 U/L — ABNORMAL HIGH (ref 0–35)
AST: 28 U/L (ref 0–37)
Albumin: 4.1 g/dL (ref 3.5–5.2)
Alkaline Phosphatase: 90 U/L (ref 39–117)
Bilirubin, Direct: 0.2 mg/dL (ref 0.0–0.3)
Total Bilirubin: 1 mg/dL (ref 0.2–1.2)
Total Protein: 6.6 g/dL (ref 6.0–8.3)

## 2023-01-01 LAB — BASIC METABOLIC PANEL
BUN: 16 mg/dL (ref 6–23)
CO2: 28 meq/L (ref 19–32)
Calcium: 9.1 mg/dL (ref 8.4–10.5)
Chloride: 108 meq/L (ref 96–112)
Creatinine, Ser: 1 mg/dL (ref 0.40–1.20)
GFR: 53.5 mL/min — ABNORMAL LOW (ref >60.00)
Glucose, Bld: 126 mg/dL — ABNORMAL HIGH (ref 70–99)
Potassium: 3.9 meq/L (ref 3.5–5.1)
Sodium: 141 meq/L (ref 135–145)

## 2023-01-01 LAB — LIPID PANEL
Cholesterol: 138 mg/dL (ref 0–200)
HDL: 38.5 mg/dL — ABNORMAL LOW (ref >39.00)
LDL Cholesterol: 78 mg/dL (ref 0–99)
NonHDL: 99.59
Total CHOL/HDL Ratio: 4
Triglycerides: 108 mg/dL (ref 0.0–149.0)
VLDL: 21.6 mg/dL (ref 0.0–40.0)

## 2023-01-01 LAB — MICROALBUMIN / CREATININE URINE RATIO
Creatinine,U: 101.4 mg/dL
Microalb Creat Ratio: 0.7 mg/g (ref 0.0–30.0)
Microalb, Ur: <0.7 mg/dL (ref 0.0–1.9)

## 2023-01-01 LAB — HEMOGLOBIN A1C: Hgb A1c MFr Bld: 6.2 % (ref 4.6–6.5)

## 2023-01-01 LAB — TSH: TSH: 4.86 u[IU]/mL (ref 0.35–5.50)

## 2023-01-03 ENCOUNTER — Encounter: Payer: Self-pay | Admitting: Internal Medicine

## 2023-01-03 ENCOUNTER — Ambulatory Visit: Payer: Medicare PPO | Admitting: Internal Medicine

## 2023-01-03 VITALS — BP 110/70 | HR 72 | Temp 97.5°F | Resp 16 | Ht 63.0 in | Wt 183.2 lb

## 2023-01-03 DIAGNOSIS — E1165 Type 2 diabetes mellitus with hyperglycemia: Secondary | ICD-10-CM

## 2023-01-03 DIAGNOSIS — I10 Essential (primary) hypertension: Secondary | ICD-10-CM

## 2023-01-03 DIAGNOSIS — R739 Hyperglycemia, unspecified: Secondary | ICD-10-CM

## 2023-01-03 DIAGNOSIS — R7989 Other specified abnormal findings of blood chemistry: Secondary | ICD-10-CM | POA: Diagnosis not present

## 2023-01-03 DIAGNOSIS — Z23 Encounter for immunization: Secondary | ICD-10-CM

## 2023-01-03 DIAGNOSIS — K219 Gastro-esophageal reflux disease without esophagitis: Secondary | ICD-10-CM

## 2023-01-03 DIAGNOSIS — N1831 Chronic kidney disease, stage 3a: Secondary | ICD-10-CM | POA: Diagnosis not present

## 2023-01-03 DIAGNOSIS — K449 Diaphragmatic hernia without obstruction or gangrene: Secondary | ICD-10-CM | POA: Diagnosis not present

## 2023-01-03 DIAGNOSIS — Z0000 Encounter for general adult medical examination without abnormal findings: Secondary | ICD-10-CM | POA: Diagnosis not present

## 2023-01-03 DIAGNOSIS — E7800 Pure hypercholesterolemia, unspecified: Secondary | ICD-10-CM

## 2023-01-03 DIAGNOSIS — N183 Chronic kidney disease, stage 3 unspecified: Secondary | ICD-10-CM | POA: Insufficient documentation

## 2023-01-03 LAB — HM DIABETES FOOT EXAM

## 2023-01-03 MED ORDER — EZETIMIBE-SIMVASTATIN 10-40 MG PO TABS: 1.0000 | ORAL_TABLET | Freq: Every day | ORAL | 1 refills | Status: DC

## 2023-01-03 MED ORDER — TELMISARTAN 40 MG PO TABS: 40.0000 mg | ORAL_TABLET | Freq: Every morning | ORAL | 1 refills | Status: DC

## 2023-01-03 MED ORDER — PANTOPRAZOLE SODIUM 40 MG PO TBEC: 40.0000 mg | DELAYED_RELEASE_TABLET | Freq: Two times a day (BID) | ORAL | 4 refills | Status: DC

## 2023-01-03 NOTE — Assessment & Plan Note (Signed)
Upper symptoms controlled with diet adjustment and daily protonix. GI evaluation as outlined

## 2023-01-03 NOTE — Progress Notes (Signed)
Subjective:    Patient ID: Kaitlin Dean, female    DOB: Dec 18, 1945, 77 y.o.   MRN: [id number]  Patient here for  Chief Complaint  Patient presents with   Annual Exam    HPI Here for a physical exam. She reports she is doing well.  Feels good.  Has been playing golf.  No chest pain or sob reported.  No abdominal pain or bowel change reported. Discussed labs.     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)    Shoulder fracture, right 11/07/2018   Skin cancer    Past Surgical History:  Procedure Laterality Date   BREAST EXCISIONAL BIOPSY Right 2000   benign   BREAST SURGERY  2000   Biopsy   CATARACT EXTRACTION W/PHACO Right 12/05/2016   Procedure: CATARACT EXTRACTION PHACO AND INTRAOCULAR LENS PLACEMENT (IOC) RIGHT SYMFONY LENS;  Surgeon: Lockie Mola, MD;  Location: [facility];  Service: Ophthalmology;  Laterality: Right;   CATARACT EXTRACTION W/PHACO Left 02/06/2017   Procedure: CATARACT EXTRACTION PHACO AND INTRAOCULAR LENS PLACEMENT (IOC) LEFT SYMFONY LENS;  Surgeon: Lockie Mola, MD;  Location: [facility];  Service: Ophthalmology;  Laterality: Left;   COLONOSCOPY     ESOPHAGOGASTRODUODENOSCOPY     ESOPHAGOGASTRODUODENOSCOPY (EGD) WITH PROPOFOL N/A 06/11/2016   Procedure: ESOPHAGOGASTRODUODENOSCOPY (EGD) WITH PROPOFOL;  Surgeon: Scot Jun, MD;  Location: [facility];  Service: Endoscopy;  Laterality: N/A;    ESOPHAGOGASTRODUODENOSCOPY (EGD) WITH PROPOFOL N/A 05/25/2019   Procedure: ESOPHAGOGASTRODUODENOSCOPY (EGD) WITH BIOPSY;  Surgeon: Midge Minium, MD;  Location: [facility];  Service: Endoscopy;  Laterality: N/A;  Priority 3   JOINT REPLACEMENT Left    knee   PTOSIS REPAIR Bilateral 06/01/2014   Procedure: PTOSIS REPAIR;  Surgeon: Imagene Riches, MD;  Location: [facility];  Service: Ophthalmology;  Laterality: Bilateral;   SKIN CANCER EXCISION     TOTAL KNEE ARTHROPLASTY Left 04/30/2012   Procedure: TOTAL KNEE ARTHROPLASTY;  Surgeon: Loanne Drilling, MD;  Location: [facility];  Service: Orthopedics;  Laterality: Left;   TUBAL LIGATION     Family History  Problem Relation Age of Onset   Hypertension Mother    Congestive Heart Failure Mother    Stroke Father    Stroke Brother    Heart failure Brother    Sarcoidosis Sister        died of kidney failure   Breast cancer Neg Hx    Colon cancer Neg Hx    Social History   Socioeconomic History   Marital status: Widowed    Spouse name: Not on file   Number of children: 2   Years of education: Not on file   Highest education level: Bachelor's degree (e.g., BA, AB, BS)  Occupational History    Comment: Retired Insurance account manager  Tobacco Use   Smoking status: Never   Smokeless  tobacco: Never  Vaping Use   Vaping status: Never Used  Substance and Sexual Activity   Alcohol use: Yes    Alcohol/week: 1.0 standard drink of alcohol    Types: 1 Glasses of wine per week    Comment: SOCIALLY   Drug use: No   Sexual activity: Not Currently  Other Topics Concern   Not on file  Social History Narrative   Patient is a widow and she is a Buyer, retail of [employer]   Retired Microbiologist in the [employer]   1 son and 1 daughter, 1 son is a Forensic scientist working for [employer] and the daughter lives in [place]   Rare alcohol non-smoker   Social Determinants of Health   Financial Resource Strain: Low Risk   (05/23/2022)   Overall Financial Resource Strain (CARDIA)    Difficulty of Paying Living Expenses: Not hard at all  Food Insecurity: No Food Insecurity (05/23/2022)   Hunger Vital Sign    Worried About Running Out of Food in the Last Year: Never true    Ran Out of Food in the Last Year: Never true  Transportation Needs: No Transportation Needs (05/23/2022)   PRAPARE - Administrator, Civil Service (Medical): No    Lack of Transportation (Non-Medical): No  Physical Activity: Sufficiently Active (05/23/2022)   Exercise Vital Sign    Days of Exercise per Week: 5 days    Minutes of Exercise per Session: 60 min  Stress: No Stress Concern Present (05/23/2022)   [employer] of Occupational Health - Occupational Stress Questionnaire    Feeling of Stress : Not at all  Social Connections: Moderately Integrated (05/23/2022)   Social Connection and Isolation Panel [NHANES]    Frequency of Communication with Friends and Family: More than three times a week    Frequency of Social Gatherings with Friends and Family: More than three times a week    Attends Religious Services: More than 4 times per year    Active Member of [employer] or Organizations: Yes    Attends Banker Meetings: More than 4 times per year    Marital Status: Widowed     Review of Systems  Constitutional:  Negative for appetite change and unexpected weight change.  HENT:  Negative for congestion, sinus pressure and sore throat.   Eyes:  Negative for pain and visual disturbance.  Respiratory:  Negative for cough, chest tightness and shortness of breath.   Cardiovascular:  Negative for chest pain, palpitations and leg swelling.  Gastrointestinal:  Negative for abdominal pain, diarrhea, nausea and vomiting.  Genitourinary:  Negative for difficulty urinating and dysuria.  Musculoskeletal:  Negative for joint swelling and myalgias.  Skin:  Negative for color change and rash.  Neurological:  Negative for  dizziness and headaches.  Hematological:  Negative for adenopathy. Does not bruise/bleed easily.  Psychiatric/Behavioral:  Negative for agitation and dysphoric mood.        Objective:     BP 110/70   Pulse 72   Temp (!) 97.5 F (36.4 C)   Resp 16   Ht 5\' 3"  (1.6 m)   Wt 183 lb 3.2 oz (83.1 kg)   LMP 01/29/1993   SpO2 98%   BMI 32.45 kg/m  Wt Readings from Last 3 Encounters:  01/03/23 183 lb 3.2 oz (83.1 kg)  08/30/22 183 lb (83 kg)  05/30/22 183 lb 6.4 oz (83.2 kg)    Physical Exam Vitals reviewed.  Constitutional:  General: She is not in acute distress.    Appearance: Normal appearance. She is well-developed.  HENT:     Head: Normocephalic and atraumatic.     Right Ear: External ear normal.     Left Ear: External ear normal.     Mouth/Throat:     Pharynx: No oropharyngeal exudate or posterior oropharyngeal erythema.  Eyes:     General: No scleral icterus.       Right eye: No discharge.        Left eye: No discharge.     Conjunctiva/sclera: Conjunctivae normal.  Neck:     Thyroid: No thyromegaly.  Cardiovascular:     Rate and Rhythm: Normal rate and regular rhythm.  Pulmonary:     Effort: No tachypnea, accessory muscle usage or respiratory distress.     Breath sounds: Normal breath sounds. No decreased breath sounds or wheezing.  Chest:  Breasts:    Right: No inverted nipple, mass, nipple discharge or tenderness (no axillary adenopathy).     Left: No inverted nipple, mass, nipple discharge or tenderness (no axilarry adenopathy).  Abdominal:     General: Bowel sounds are normal.     Palpations: Abdomen is soft.     Tenderness: There is no abdominal tenderness.  Musculoskeletal:        General: No swelling or tenderness.     Cervical back: Neck supple.  Lymphadenopathy:     Cervical: No cervical adenopathy.  Skin:    Findings: No erythema or rash.  Neurological:     Mental Status: She is alert and oriented to person, place, and time.  Psychiatric:         Mood and Affect: Mood normal.        Behavior: Behavior normal.      Outpatient Encounter Medications as of 01/03/2023  Medication Sig   albuterol (VENTOLIN HFA) 108 (90 Base) MCG/ACT inhaler Inhale 2 puffs into the lungs every 6 (six) hours as needed for wheezing or shortness of breath.   Cholecalciferol (VITAMIN D3 PO) Take by mouth daily.   ezetimibe-simvastatin (VYTORIN) 10-40 MG tablet Take 1 tablet by mouth daily.   Multiple Vitamin (MULTIVITAMIN) capsule Take 1 capsule by mouth daily. AM   pantoprazole (PROTONIX) 40 MG tablet Take 1 tablet (40 mg total) by mouth 2 (two) times daily before a meal.   telmisartan (MICARDIS) 40 MG tablet Take 1 tablet (40 mg total) by mouth every morning.   vitamin B-12 (CYANOCOBALAMIN) 1000 MCG tablet Take 1,000 mcg by mouth daily.   Vitamin E 400 units TABS Take by mouth daily.   [DISCONTINUED] ezetimibe-simvastatin (VYTORIN) 10-40 MG tablet Take 1 tablet by mouth daily.   [DISCONTINUED] pantoprazole (PROTONIX) 40 MG tablet Take 1 tablet (40 mg total) by mouth 2 (two) times daily before a meal.   [DISCONTINUED] telmisartan (MICARDIS) 40 MG tablet Take 1 tablet (40 mg total) by mouth every morning.   No facility-administered encounter medications on file as of 01/03/2023.     Lab Results  Component Value Date   WBC 5.0 08/27/2022   HGB 14.2 08/27/2022   HCT 43.0 08/27/2022   PLT 255.0 08/27/2022   GLUCOSE 126 (H) 01/01/2023   CHOL 138 01/01/2023   TRIG 108.0 01/01/2023   HDL 38.50 (L) 01/01/2023   LDLDIRECT 182.4 02/05/2012   LDLCALC 78 01/01/2023   ALT 36 (H) 01/01/2023   AST 28 01/01/2023   NA 141 01/01/2023   K 3.9 01/01/2023   CL 108 01/01/2023   CREATININE  1.00 01/01/2023   BUN 16 01/01/2023   CO2 28 01/01/2023   TSH 4.86 01/01/2023   INR 0.99 04/22/2012   HGBA1C 6.2 01/01/2023   MICROALBUR <0.7 01/01/2023    MM 3D SCREENING MAMMOGRAM BILATERAL BREAST  Result Date: 12/11/2022 CLINICAL DATA:  Screening. EXAM: DIGITAL  SCREENING BILATERAL MAMMOGRAM WITH TOMOSYNTHESIS AND CAD TECHNIQUE: Bilateral screening digital craniocaudal and mediolateral oblique mammograms were obtained. Bilateral screening digital breast tomosynthesis was performed. The images were evaluated with computer-aided detection. COMPARISON:  Previous exam(s). ACR Breast Density Category b: There are scattered areas of fibroglandular density. FINDINGS: There are no findings suspicious for malignancy. IMPRESSION: No mammographic evidence of malignancy. A result letter of this screening mammogram will be mailed directly to the patient. RECOMMENDATION: Screening mammogram in one year. (Code:SM-B-01Y) BI-RADS CATEGORY  1: Negative. Electronically Signed   By: Amie Portland M.D.   On: 12/11/2022 09:49       Assessment & Plan:  Routine general medical examination at a health care facility  Health care maintenance Assessment & Plan: Physical today 01/03/23.  PAP 10/05/20 - negative with negative HPV.  Mammogram 12/07/22 - Birads I.  Colonoscopy 10/2013.  Recommended f/u in 10 years.    Hypercholesterolemia Assessment & Plan: Continue vytorin.  Low cholesterol diet and exercise. Follow lipid panel and liver function tests.   Lab Results  Component Value Date   CHOL 138 01/01/2023   HDL 38.50 (L) 01/01/2023   LDLCALC 78 01/01/2023   LDLDIRECT 182.4 02/05/2012   TRIG 108.0 01/01/2023   CHOLHDL 4 01/01/2023     Orders: -     Basic metabolic panel; Future -     Hepatic function panel; Future  Hyperglycemia  Need for influenza vaccination -     Flu Vaccine Trivalent High Dose (Fluad)  Type 2 diabetes mellitus with hyperglycemia, without long-term current use of insulin (HCC) Assessment & Plan: Low carb diet and exercise. Has adjusted diet.  Follow met b and a1c. A1c 6.2.    Primary hypertension Assessment & Plan: Continue micardis.  Blood pressure doing well.  Follow pressures.  Follow metabolic panel.     Hiatal hernia with  GERD Assessment & Plan: Upper symptoms controlled with diet adjustment and daily protonix. GI evaluation as outlined    Stage 3a chronic kidney disease (HCC) Assessment & Plan: Continue micardis.  Stay hydrated.  Avoid antiinflammatory medication.  Follow met b.  Recheck with next non fasting lab.    Abnormal liver function test Assessment & Plan: Discussed lab.  Slight elevation ALT.  Continue diet and exercise.  Recheck with next non  fasting lab.    Other orders -     Ezetimibe-Simvastatin; Take 1 tablet by mouth daily.  Dispense: 90 tablet; Refill: 1 -     Pantoprazole Sodium; Take 1 tablet (40 mg total) by mouth 2 (two) times daily before a meal.  Dispense: 180 tablet; Refill: 4 -     Telmisartan; Take 1 tablet (40 mg total) by mouth every morning.  Dispense: 90 tablet; Refill: 1     Dale , MD

## 2023-01-03 NOTE — Assessment & Plan Note (Signed)
Continue vytorin.  Low cholesterol diet and exercise. Follow lipid panel and liver function tests.   Lab Results  Component Value Date   CHOL 138 01/01/2023   HDL 38.50 (L) 01/01/2023   LDLCALC 78 01/01/2023   LDLDIRECT 182.4 02/05/2012   TRIG 108.0 01/01/2023   CHOLHDL 4 01/01/2023

## 2023-01-03 NOTE — Assessment & Plan Note (Signed)
Low carb diet and exercise. Has adjusted diet.  Follow met b and a1c. A1c 6.2.

## 2023-01-03 NOTE — Assessment & Plan Note (Signed)
Continue micardis.  Stay hydrated.  Avoid antiinflammatory medication.  Follow met b.  Recheck with next non fasting lab.

## 2023-01-03 NOTE — Assessment & Plan Note (Signed)
Physical today 01/03/23.  PAP 10/05/20 - negative with negative HPV.  Mammogram 12/07/22 - Birads I.  Colonoscopy 10/2013.  Recommended f/u in 10 years.

## 2023-01-03 NOTE — Assessment & Plan Note (Signed)
Continue micardis.  Blood pressure doing well.  Follow pressures.  Follow metabolic panel.   

## 2023-01-03 NOTE — Assessment & Plan Note (Signed)
Discussed lab.  Slight elevation ALT.  Continue diet and exercise.  Recheck with next non  fasting lab.

## 2023-01-31 ENCOUNTER — Other Ambulatory Visit (INDEPENDENT_AMBULATORY_CARE_PROVIDER_SITE_OTHER): Payer: Medicare PPO

## 2023-01-31 DIAGNOSIS — E7800 Pure hypercholesterolemia, unspecified: Secondary | ICD-10-CM | POA: Diagnosis not present

## 2023-01-31 LAB — BASIC METABOLIC PANEL
BUN: 20 mg/dL (ref 6–23)
CO2: 25 meq/L (ref 19–32)
Calcium: 9.3 mg/dL (ref 8.4–10.5)
Chloride: 108 meq/L (ref 96–112)
Creatinine, Ser: 1 mg/dL (ref 0.40–1.20)
GFR: 53.47 mL/min — ABNORMAL LOW (ref >60.00)
Glucose, Bld: 84 mg/dL (ref 70–99)
Potassium: 3.7 meq/L (ref 3.5–5.1)
Sodium: 141 meq/L (ref 135–145)

## 2023-01-31 LAB — HEPATIC FUNCTION PANEL
ALT: 32 U/L (ref 0–35)
AST: 32 U/L (ref 0–37)
Albumin: 4.2 g/dL (ref 3.5–5.2)
Alkaline Phosphatase: 97 U/L (ref 39–117)
Bilirubin, Direct: 0.2 mg/dL (ref 0.0–0.3)
Total Bilirubin: 0.8 mg/dL (ref 0.2–1.2)
Total Protein: 6.6 g/dL (ref 6.0–8.3)

## 2023-03-13 ENCOUNTER — Ambulatory Visit: Payer: Medicare PPO | Admitting: *Deleted

## 2023-03-13 VITALS — Ht 63.0 in | Wt 175.0 lb

## 2023-03-13 DIAGNOSIS — Z0000 Encounter for general adult medical examination without abnormal findings: Secondary | ICD-10-CM

## 2023-03-13 NOTE — Progress Notes (Signed)
Subjective:   Kaitlin Dean is a 78 y.o. female who presents for Medicare Annual (Subsequent) preventive examination.  Visit Complete: Virtual I connected with  Kaitlin Dean on 03/13/23 by a audio enabled telemedicine application and verified that I am speaking with the correct person using two identifiers. This patient declined Interactive audio and Acupuncturist. Therefore the visit was completed with audio only.   Patient Location: Home  Provider Location: Home Office  I discussed the limitations of evaluation and management by telemedicine. The patient expressed understanding and agreed to proceed.  Vital Signs: Because this visit was a virtual/telehealth visit, some criteria may be missing or patient reported. Any vitals not documented were not able to be obtained and vitals that have been documented are patient reported.  Patient Medicare AWV questionnaire was completed by the patient on 03/12/23; I have confirmed that all information answered by patient is correct and no changes since this date.  Cardiac Risk Factors include: advanced age (>17men, >16 women);diabetes mellitus;dyslipidemia;hypertension;obesity (BMI >30kg/m2)     Objective:    Today's Vitals   03/13/23 0812  Weight: 175 lb (79.4 kg)  Height: 5\' 3"  (1.6 m)   Body mass index is 31 kg/m.     03/13/2023    8:23 AM 03/12/2022   11:37 AM 02/27/2021    8:43 AM 02/25/2020    8:57 AM 05/25/2019    8:21 AM 03/17/2019    1:17 PM 02/24/2019    8:43 AM  Advanced Directives  Does Patient Have a Medical Advance Directive? No No Yes Yes Yes Yes Yes  Type of Surveyor, minerals;Living will Healthcare Power of [place];Living will Healthcare Power of [place];Living will Healthcare Power of [place];Living will Healthcare Power of [place];Living will  Does patient want to make changes to medical advance directive?   No - Patient declined No - Patient declined   No - Patient  declined  Copy of Healthcare Power of Attorney in Chart?   No - copy requested No - copy requested Yes - validated most recent copy scanned in chart (See row information) No - copy requested No - copy requested  Would patient like information on creating a medical advance directive? No - Patient declined No - Patient declined         Current Medications (verified) Outpatient Encounter Medications as of 03/13/2023  Medication Sig   albuterol (VENTOLIN HFA) 108 (90 Base) MCG/ACT inhaler Inhale 2 puffs into the lungs every 6 (six) hours as needed for wheezing or shortness of breath.   Cholecalciferol (VITAMIN D3 PO) Take by mouth daily.   ezetimibe-simvastatin (VYTORIN) 10-40 MG tablet Take 1 tablet by mouth daily.   Multiple Vitamin (MULTIVITAMIN) capsule Take 1 capsule by mouth daily. AM   pantoprazole (PROTONIX) 40 MG tablet Take 1 tablet (40 mg total) by mouth 2 (two) times daily before a meal.   Probiotic Product (PROBIOTIC DAILY PO) Take by mouth daily.   telmisartan (MICARDIS) 40 MG tablet Take 1 tablet (40 mg total) by mouth every morning.   vitamin B-12 (CYANOCOBALAMIN) 1000 MCG tablet Take 1,000 mcg by mouth daily.   Vitamin E 400 units TABS Take by mouth daily.   No facility-administered encounter medications on file as of 03/13/2023.    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)    Shoulder fracture, right  11/07/2018   Skin cancer    Past Surgical History:  Procedure Laterality Date   BREAST EXCISIONAL BIOPSY Right 2000   benign   BREAST SURGERY  2000   Biopsy   CATARACT EXTRACTION W/PHACO Right 12/05/2016   Procedure: CATARACT EXTRACTION PHACO AND INTRAOCULAR LENS PLACEMENT (IOC) RIGHT SYMFONY LENS;  Surgeon: Lockie Mola, MD;  Location: [facility];  Service: Ophthalmology;  Laterality: Right;   CATARACT EXTRACTION W/PHACO Left 02/06/2017   Procedure: CATARACT EXTRACTION PHACO AND INTRAOCULAR LENS PLACEMENT (IOC) LEFT SYMFONY LENS;  Surgeon: Lockie Mola, MD;  Location: [facility];  Service: Ophthalmology;  Laterality: Left;   COLONOSCOPY     ESOPHAGOGASTRODUODENOSCOPY     ESOPHAGOGASTRODUODENOSCOPY (EGD) WITH PROPOFOL N/A 06/11/2016   Procedure: ESOPHAGOGASTRODUODENOSCOPY (EGD) WITH PROPOFOL;  Surgeon: Scot Jun, MD;  Location: [facility];  Service: Endoscopy;  Laterality: N/A;   ESOPHAGOGASTRODUODENOSCOPY (EGD) WITH PROPOFOL N/A 05/25/2019   Procedure: ESOPHAGOGASTRODUODENOSCOPY (EGD) WITH BIOPSY;  Surgeon: Midge Minium, MD;  Location: [facility];  Service: Endoscopy;  Laterality: N/A;  Priority 3   JOINT REPLACEMENT Left    knee   PTOSIS REPAIR Bilateral 06/01/2014   Procedure: PTOSIS REPAIR;  Surgeon: Imagene Riches, MD;  Location: [facility];  Service: Ophthalmology;  Laterality: Bilateral;   SKIN CANCER EXCISION     TOTAL KNEE ARTHROPLASTY Left 04/30/2012   Procedure: TOTAL KNEE ARTHROPLASTY;  Surgeon: Loanne Drilling, MD;  Location: [facility];  Service: Orthopedics;  Laterality: Left;   TUBAL LIGATION     Family History  Problem Relation Age of Onset   Hypertension Mother    Congestive Heart Failure Mother    Stroke Father    Stroke Brother    Heart failure Brother    Sarcoidosis Sister        died of kidney failure   Breast cancer Neg Hx    Colon cancer Neg Hx    Social History   Socioeconomic History   Marital  status: Widowed    Spouse name: Not on file   Number of children: 2   Years of education: Not on file   Highest education level: Bachelor's degree (e.g., BA, AB, BS)  Occupational History    Comment: Retired Insurance account manager  Tobacco Use   Smoking status: Never   Smokeless tobacco: Never  Vaping Use   Vaping status: Never Used  Substance and Sexual Activity   Alcohol use: Yes    Alcohol/week: 1.0 standard drink of alcohol    Types: 1 Glasses of wine per week    Comment: SOCIALLY   Drug use: No   Sexual activity: Not Currently  Other Topics Concern   Not on file  Social History Narrative   Patient is a widow and she is a Buyer, retail of [employer]   Retired Microbiologist in the [employer]   1 son and 1 daughter, 1 son is a Forensic scientist working for  [employer] and the daughter lives in [place]   Rare alcohol non-smoker   Social Drivers of Corporate investment banker Strain: Low Risk  (03/12/2023)   Overall Financial Resource Strain (CARDIA)    Difficulty of Paying Living Expenses: Not hard at all  Food Insecurity: No Food Insecurity (03/12/2023)   Hunger Vital Sign    Worried About Running Out of Food in the Last Year: Never true    Ran Out of Food in the Last Year: Never true  Transportation Needs: No Transportation Needs (03/12/2023)   PRAPARE - Administrator, Civil Service (Medical): No    Lack of Transportation (Non-Medical): No  Physical Activity: Sufficiently Active (03/12/2023)   Exercise Vital Sign    Days of Exercise per Week: 5 days    Minutes of Exercise per Session: 50 min  Stress: No Stress Concern Present (03/12/2023)   [employer] of Occupational Health - Occupational Stress Questionnaire    Feeling of Stress : Not at all  Social Connections: Moderately Integrated (03/12/2023)   Social Connection and Isolation Panel [NHANES]    Frequency of Communication with Friends and Family: More than three times a week     Frequency of Social Gatherings with Friends and Family: More than three times a week    Attends Religious Services: More than 4 times per year    Active Member of [employer] or Organizations: Yes    Attends Banker Meetings: More than 4 times per year    Marital Status: Widowed    Tobacco Counseling Counseling given: Not Answered   Clinical Intake:  Pre-visit preparation completed: Yes  Pain : No/denies pain     BMI - recorded: 31 Nutritional Status: BMI > 30  Obese Nutritional Risks: None Diabetes: Yes CBG done?: No Did pt. bring in CBG monitor from home?: No  How often do you need to have someone help you when you read instructions, pamphlets, or other written materials from your doctor or pharmacy?: 1 - Never  Interpreter Needed?: No  Information entered by :: R. Uriel Horkey LPN   Activities of Daily Living    03/12/2023    7:26 PM  In your present state of health, do you have any difficulty performing the following activities:  Hearing? 0  Vision? 0  Difficulty concentrating or making decisions? 0  Walking or climbing stairs? 0  Dressing or bathing? 0  Doing errands, shopping? 0  Preparing Food and eating ? N  Using the Toilet? N  In the past six months, have you accidently leaked urine? N  Do you have problems with loss of bowel control? N  Managing your Medications? N  Managing your Finances? N  Housekeeping or managing your Housekeeping? N    Patient Care Team: Dale Ontario, MD as PCP - General (Internal Medicine)  Indicate any recent Medical Services you may have received from other than [facility] providers in the past year (date may be approximate).     Assessment:   This is a routine wellness examination for Emmelyn.  Hearing/Vision screen Hearing Screening - Comments:: No issues Vision Screening - Comments:: readers   Goals Addressed             This Visit's Progress    Patient Stated       Wants to continue to lose some weight and  stay on diet       Depression Screen    03/13/2023    8:19 AM 03/30/2022  8:32 AM 03/12/2022   11:40 AM 01/10/2022    1:28 PM 11/28/2021    9:24 AM 06/07/2021    8:30 AM 05/30/2021   12:49 PM  PHQ 2/9 Scores  PHQ - 2 Score 0 0 0 0 0 0 0  PHQ- 9 Score 0 0         Fall Risk    03/12/2023    7:26 PM 03/30/2022    8:32 AM 03/12/2022   11:41 AM 03/11/2022    3:22 PM 01/10/2022    1:28 PM  Fall Risk   Falls in the past year? 1 0 0 0 0  Number falls in past yr: 0 0  0   Injury with Fall? 0 0     Risk for fall due to : No Fall Risks No Fall Risks   No Fall Risks  Follow up Falls prevention discussed;Falls evaluation completed Falls evaluation completed Falls evaluation completed;Falls prevention discussed  Falls evaluation completed    MEDICARE RISK AT HOME: Medicare Risk at Home Any stairs in or around the home?: Yes If so, are there any without handrails?: Yes Home free of loose throw rugs in walkways, pet beds, electrical cords, etc?: (Patient-Rptd) Yes Adequate lighting in your home to reduce risk of falls?: (Patient-Rptd) Yes Life alert?: (Patient-Rptd) No Use of a cane, walker or w/c?: (Patient-Rptd) Yes Grab bars in the bathroom?: (Patient-Rptd) Yes Shower chair or bench in shower?: (Patient-Rptd) Yes Elevated toilet seat or a handicapped toilet?: (Patient-Rptd) No  Cognitive Function:    02/19/2017    9:28 AM 02/14/2015    9:03 AM  MMSE - Mini Mental State Exam  Orientation to time 5 5  Orientation to Place 5 5  Registration 3 3  Attention/ Calculation 5 5  Recall 3 3  Language- name 2 objects 2 2  Language- repeat 1 1  Language- follow 3 step command 3 3  Language- read & follow direction 1 1  Write a sentence 1 1  Copy design 1 1  Total score 30 30        03/13/2023    8:24 AM 03/12/2022   11:47 AM 02/24/2019    8:47 AM 02/21/2018    8:56 AM 02/20/2016    9:30 AM  6CIT Screen  What Year? 0 points 0 points 0 points 0 points 0 points  What month? 0 points 0  points 0 points 0 points 0 points  What time? 0 points 0 points 0 points 0 points 0 points  Count back from 20 0 points 0 points  0 points 0 points  Months in reverse 0 points 0 points  0 points 0 points  Repeat phrase 0 points 0 points  0 points 0 points  Total Score 0 points 0 points  0 points 0 points    Immunizations Immunization History  Administered Date(s) Administered   Fluad Quad(high Dose 65+) 11/28/2021   Fluad Trivalent(High Dose 65+) 01/03/2023   Influenza Split 10/30/2011, 11/14/2012   Influenza, High Dose Seasonal PF 11/01/2015, 11/09/2016, 11/18/2017, 12/03/2018   Influenza,inj,Quad PF,6+ Mos 11/12/2013, 01/18/2015   Influenza-Unspecified 11/23/2011, 12/15/2019, 11/29/2020   PFIZER(Purple Top)SARS-COV-2 Vaccination 02/04/2019, 02/25/2019, 10/26/2019   Pneumococcal Conjugate-13 08/27/2013   Pneumococcal Polysaccharide-23 08/21/2017   Respiratory Syncytial Virus Vaccine,Recomb Aduvanted(Arexvy) 11/01/2021   Zoster Recombinant(Shingrix) 10/01/2017, 01/03/2018   Zoster, Live 03/29/2010    TDAP status: Due, Education has been provided regarding the importance of this vaccine. Advised may receive this vaccine at local pharmacy or Health Dept.  Aware to provide a copy of the vaccination record if obtained from local pharmacy or Health Dept. Verbalized acceptance and understanding.  Flu Vaccine status: Up to date  Pneumococcal vaccine status: Up to date  Covid-19 vaccine status: Information provided on how to obtain vaccines.   Qualifies for Shingles Vaccine? Yes   Zostavax completed Yes   Shingrix Completed?: Yes  Screening Tests Health Maintenance  Topic Date Due   DTaP/Tdap/Td (1 - Tdap) Never done   COVID-19 Vaccine (4 - 2024-25 season) 09/30/2022   Medicare Annual Wellness (AWV)  03/13/2023   OPHTHALMOLOGY EXAM  04/30/2023   HEMOGLOBIN A1C  07/02/2023   MAMMOGRAM  12/07/2023   Diabetic kidney evaluation - Urine ACR  01/01/2024   FOOT EXAM  01/03/2024    Diabetic kidney evaluation - eGFR measurement  01/31/2024   Pneumonia Vaccine 53+ Years old  Completed   INFLUENZA VACCINE  Completed   DEXA SCAN  Completed   Hepatitis C Screening  Completed   Zoster Vaccines- Shingrix  Completed   HPV VACCINES  Aged Out   Colonoscopy  Discontinued    Health Maintenance  Health Maintenance Due  Topic Date Due   DTaP/Tdap/Td (1 - Tdap) Never done   COVID-19 Vaccine (4 - 2024-25 season) 09/30/2022   Medicare Annual Wellness (AWV)  03/13/2023    Colorectal cancer screening: No longer required.   Mammogram status: Completed 11/2022. Repeat every year  Bone Density status: Completed 07/2018. Results reflect: Bone density results: OSTEOPOROSIS. Repeat every 2 years. Patient declines at this time  Lung Cancer Screening: (Low Dose CT Chest recommended if Age 38-80 years, 20 pack-year currently smoking OR have quit w/in 15years.) does not qualify.     Additional Screening:  Hepatitis C Screening: does qualify; Completed 01/2015  Vision Screening: Recommended annual ophthalmology exams for early detection of glaucoma and other disorders of the eye. Is the patient up to date with their annual eye exam?  Yes  Who is the provider or what is the name of the office in which the patient attends annual eye exams? [facility] If pt is not established with a provider, would they like to be referred to a provider to establish care? No .   Dental Screening: Recommended annual dental exams for proper oral hygiene  Diabetic Foot Exam: Diabetic Foot Exam: Completed 12/2022  Community Resource Referral / Chronic Care Management: CRR required this visit?  No   CCM required this visit?  No     Plan:     I have personally reviewed and noted the following in the patient's chart:   Medical and social history Use of alcohol, tobacco or illicit drugs  Current medications and supplements including opioid prescriptions. Patient is not currently taking opioid  prescriptions. Functional ability and status Nutritional status Physical activity Advanced directives List of other physicians Hospitalizations, surgeries, and ER visits in previous 12 months Vitals Screenings to include cognitive, depression, and falls Referrals and appointments  In addition, I have reviewed and discussed with patient certain preventive protocols, quality metrics, and best practice recommendations. A written personalized care plan for preventive services as well as general preventive health recommendations were provided to patient.     Sydell Axon, LPN   05/07/8117   After Visit Summary: (MyChart) Due to this being a telephonic visit, the after visit summary with patients personalized plan was offered to patient via MyChart   Nurse Notes: None

## 2023-03-13 NOTE — Patient Instructions (Signed)
Ms. Roa , Thank you for taking time to come for your Medicare Wellness Visit. I appreciate your ongoing commitment to your health goals. Please review the following plan we discussed and let me know if I can assist you in the future.   Referrals/Orders/Follow-Ups/Clinician Recommendations: Remember to get your tetanus vaccines.  This is a list of the screening recommended for you and due dates:  Health Maintenance  Topic Date Due   DTaP/Tdap/Td vaccine (1 - Tdap) Never done   COVID-19 Vaccine (4 - 2024-25 season) 09/30/2022   Eye exam for diabetics  04/30/2023   Hemoglobin A1C  07/02/2023   Mammogram  12/07/2023   Yearly kidney health urinalysis for diabetes  01/01/2024   Complete foot exam   01/03/2024   Yearly kidney function blood test for diabetes  01/31/2024   Medicare Annual Wellness Visit  03/12/2024   Pneumonia Vaccine  Completed   Flu Shot  Completed   DEXA scan (bone density measurement)  Completed   Hepatitis C Screening  Completed   Zoster (Shingles) Vaccine  Completed   HPV Vaccine  Aged Out   Colon Cancer Screening  Discontinued    Advanced directives: (Declined) Advance directive discussed with you today. Even though you declined this today, please call our office should you change your mind, and we can give you the proper paperwork for you to fill out. Can pick up at office at next visit.  Next Medicare Annual Wellness Visit scheduled for next year: Yes 03/16/24 @ 8:10

## 2023-05-01 DIAGNOSIS — H16223 Keratoconjunctivitis sicca, not specified as Sjogren's, bilateral: Secondary | ICD-10-CM | POA: Diagnosis not present

## 2023-05-01 DIAGNOSIS — Z961 Presence of intraocular lens: Secondary | ICD-10-CM | POA: Diagnosis not present

## 2023-05-02 ENCOUNTER — Other Ambulatory Visit: Payer: Self-pay

## 2023-05-02 DIAGNOSIS — E1165 Type 2 diabetes mellitus with hyperglycemia: Secondary | ICD-10-CM

## 2023-05-02 DIAGNOSIS — E7800 Pure hypercholesterolemia, unspecified: Secondary | ICD-10-CM

## 2023-05-06 ENCOUNTER — Ambulatory Visit: Admitting: Family Medicine

## 2023-05-06 ENCOUNTER — Encounter: Payer: Self-pay | Admitting: Family Medicine

## 2023-05-06 VITALS — BP 110/80 | HR 97 | Temp 99.4°F | Ht 63.0 in | Wt 175.5 lb

## 2023-05-06 DIAGNOSIS — R058 Other specified cough: Secondary | ICD-10-CM

## 2023-05-06 DIAGNOSIS — R051 Acute cough: Secondary | ICD-10-CM

## 2023-05-06 LAB — POC COVID19 BINAXNOW: SARS Coronavirus 2 Ag: NEGATIVE

## 2023-05-06 LAB — POC INFLUENZA A&B (BINAX/QUICKVUE)
Influenza A, POC: NEGATIVE
Influenza B, POC: NEGATIVE

## 2023-05-06 MED ORDER — HYDROCOD POLI-CHLORPHE POLI ER 10-8 MG/5ML PO SUER: 5.0000 mL | Freq: Two times a day (BID) | ORAL | 0 refills | Status: DC

## 2023-05-06 MED ORDER — FLUTICASONE PROPIONATE 50 MCG/ACT NA SUSP
2.0000 | Freq: Every day | NASAL | 6 refills | Status: DC
Start: 2023-05-06 — End: 2023-05-20

## 2023-05-06 MED ORDER — ALBUTEROL SULFATE HFA 108 (90 BASE) MCG/ACT IN AERS: 2.0000 | INHALATION_SPRAY | Freq: Four times a day (QID) | RESPIRATORY_TRACT | 0 refills | Status: AC | PRN

## 2023-05-06 MED ORDER — AZELASTINE HCL 0.1 % NA SOLN
2.0000 | Freq: Two times a day (BID) | NASAL | 12 refills | Status: DC
Start: 2023-05-06 — End: 2023-05-20

## 2023-05-06 NOTE — Patient Instructions (Addendum)
 It was a pleasure meeting you today. Thank you for allowing me to take part in your health care.  Our goals for today as we discussed include:  COVID negative Flu A&B negative  Start Astelin 2 sprays 2 times a day Start Flonase 2 sprays 2 times a day Cough syrup at night as needed  Refill sent for Albuterol 2 puffs every 6 hours for shortness of breath or wheezing  Follow up with PCP if symptoms do not improve  You can take Tylenol and/or Ibuprofen as needed for fever reduction and pain relief.   For cough: honey 1/2 to 1 teaspoon (you can dilute the honey in water or another fluid).  You can also use guaifenesin and dextromethorphan for cough. You can use a humidifier for chest congestion and cough.  If you don't have a humidifier, you can sit in the bathroom with the hot shower running.      For sore throat: try warm salt water gargles, cepacol lozenges, throat spray, warm tea or water with lemon/honey, popsicles or ice, or OTC cold relief medicine for throat discomfort.   For congestion: take a daily anti-histamine like Zyrtec, Claritin, and a oral decongestant, such as pseudoephedrine.  You can also use Flonase 1-2 sprays in each nostril daily.   It is important to stay hydrated: drink plenty of fluids (water, gatorade/powerade/pedialyte, juices, or teas) to keep your throat moisturized and help further relieve irritation/discomfort.    If you have any questions or concerns, please do not hesitate to call the office at [phone].  I look forward to our next visit and until then take care and stay safe.  Regards,   Dana Allan, MD   [facility]

## 2023-05-06 NOTE — Progress Notes (Signed)
 SUBJECTIVE:   Chief Complaint  Patient presents with   Cough    X 4 days   HPI Presents to clinic for acute visit  Discussed the use of AI scribe software for clinical note transcription with the patient, who gave verbal consent to proceed.  History of Present Illness Kaitlin Dean is an 78 year old female with asthma who presents with a cough.  She has been experiencing a persistent cough since Thursday afternoon, which sometimes leads to shortness of breath. She has asthma and has been using her albuterol  inhaler once at night since the onset of her symptoms. No wheezing or shortness of breath when lying flat or during normal walking distances. She is active, participating in water  aerobics four times a week.  The cough is productive with greenish sputum and she feels that there is thick mucus in the back of her throat. No muscle aches or pains, and although she feels stiff in the morning, stretching alleviates this. No history of ear infections and no pain when pressure is applied to her sinuses.  She experiences chills and wears a sweater indoors despite not usually being cold-natured. No fever. She also experiences nasal congestion with a runny nose and a sore throat. She has been working outside in the yard, which may have contributed to her symptoms.  She has been using Benadryl  at night and saline nasal spray as recommended by a previous doctor. She has not used Flonase  or other nasal sprays. For her cough, she has been using Mucinex DM, taking one for daytime and one for nighttime. She also uses a small amount of whiskey to numb her throat before bed, although alcohol is not recommended due to her Barrett's esophagus.  Her blood pressure was recorded as 110/60, which is lower than her usual readings. She is on blood pressure medication but plans to skip it for a few days due to the low reading. No dizziness but felt 'kind of wobbly' initially.    PERTINENT PMH / PSH: As  above  OBJECTIVE:  BP 110/80   Pulse 97   Temp 99.4 F (37.4 C)   Ht 5\' 3"  (1.6 m)   Wt 175 lb 8 oz (79.6 kg)   LMP 01/29/1993   SpO2 98%   BMI 31.09 kg/m    Physical Exam Vitals reviewed.  Constitutional:      General: She is not in acute distress.    Appearance: Normal appearance. She is normal weight. She is not ill-appearing, toxic-appearing or diaphoretic.  HENT:     Right Ear: Tympanic membrane, ear canal and external ear normal.     Left Ear: Tympanic membrane, ear canal and external ear normal.     Nose: Congestion and rhinorrhea present.     Mouth/Throat:     Mouth: Mucous membranes are moist.  Eyes:     General:        Right eye: No discharge.        Left eye: No discharge.     Conjunctiva/sclera: Conjunctivae normal.  Cardiovascular:     Rate and Rhythm: Normal rate and regular rhythm.     Heart sounds: Normal heart sounds.  Pulmonary:     Effort: Pulmonary effort is normal.     Breath sounds: Normal breath sounds. No wheezing or rhonchi.  Musculoskeletal:        General: Normal range of motion.  Lymphadenopathy:     Cervical: No cervical adenopathy.  Skin:    General: Skin  is warm and dry.  Neurological:     General: No focal deficit present.     Mental Status: She is alert and oriented to person, place, and time. Mental status is at baseline.  Psychiatric:        Mood and Affect: Mood normal.        Behavior: Behavior normal.        Thought Content: Thought content normal.        Judgment: Judgment normal.           05/06/2023   11:50 AM 03/13/2023    8:19 AM 03/30/2022    8:32 AM 03/12/2022   11:40 AM 01/10/2022    1:28 PM  Depression screen PHQ 2/9  Decreased Interest 0 0 0 0 0  Down, Depressed, Hopeless 0 0 0 0 0  PHQ - 2 Score 0 0 0 0 0  Altered sleeping 0 0 0    Tired, decreased energy 0 0 0    Change in appetite 0 0 0    Feeling bad or failure about yourself  0 0 0    Trouble concentrating 0 0 0    Moving slowly or fidgety/restless 0  0 0    Suicidal thoughts 0 0 0    PHQ-9 Score 0 0 0    Difficult doing work/chores Not difficult at all Not difficult at all Not difficult at all        05/06/2023   11:50 AM 03/30/2022    8:33 AM  GAD 7 : Generalized Anxiety Score  Nervous, Anxious, on Edge 0 0  Control/stop worrying 0 0  Worry too much - different things 0 0  Trouble relaxing 0 0  Restless 0 0  Easily annoyed or irritable 0 0  Afraid - awful might happen 0 0  Total GAD 7 Score 0 0  Anxiety Difficulty  Not difficult at all    ASSESSMENT/PLAN:  Acute cough Assessment & Plan: Likely viral cough, possibly reactive to weather or allergens. No bacterial infection. Asthma stable, no exacerbation. - Refilled albuterol  inhaler, instructed two puffs every six hours as needed for wheezing or dyspnea. - Prescribed hydrocodone -containing cough medicine. - Consider Tessalon  Perles if current treatment ineffective. - Recommended nasal sprays such as Flonase  or Astelin  for congestion. - Continue saline nasal spray.  Orders: -     POC COVID-19 BinaxNow -     POC Influenza A&B(BINAX/QUICKVUE) -     Azelastine  HCl; Place 2 sprays into both nostrils 2 (two) times daily. Use in each nostril as directed  Dispense: 30 mL; Refill: 12 -     Fluticasone  Propionate; Place 2 sprays into both nostrils daily.  Dispense: 16 g; Refill: 6 -     Hydrocod Poli-Chlorphe Poli ER; Take 5 mLs by mouth 2 (two) times daily.  Dispense: 50 mL; Refill: 0 -     Albuterol  Sulfate HFA; Inhale 2 puffs into the lungs every 6 (six) hours as needed for wheezing or shortness of breath.  Dispense: 1 each; Refill: 0    PDMP reviewed  Return if symptoms worsen or fail to improve, for PCP.  Valli Gaw, MD

## 2023-05-07 ENCOUNTER — Other Ambulatory Visit: Payer: Medicare PPO

## 2023-05-09 ENCOUNTER — Ambulatory Visit: Payer: Medicare PPO | Admitting: Internal Medicine

## 2023-05-16 ENCOUNTER — Other Ambulatory Visit (INDEPENDENT_AMBULATORY_CARE_PROVIDER_SITE_OTHER)

## 2023-05-16 DIAGNOSIS — E1165 Type 2 diabetes mellitus with hyperglycemia: Secondary | ICD-10-CM | POA: Diagnosis not present

## 2023-05-16 DIAGNOSIS — E7800 Pure hypercholesterolemia, unspecified: Secondary | ICD-10-CM | POA: Diagnosis not present

## 2023-05-16 LAB — HEPATIC FUNCTION PANEL
ALT: 35 U/L (ref 0–35)
AST: 31 U/L (ref 0–37)
Albumin: 4.5 g/dL (ref 3.5–5.2)
Alkaline Phosphatase: 88 U/L (ref 39–117)
Bilirubin, Direct: 0.2 mg/dL (ref 0.0–0.3)
Total Bilirubin: 1.1 mg/dL (ref 0.2–1.2)
Total Protein: 6.8 g/dL (ref 6.0–8.3)

## 2023-05-16 LAB — BASIC METABOLIC PANEL WITH GFR
BUN: 20 mg/dL (ref 6–23)
CO2: 26 meq/L (ref 19–32)
Calcium: 9.4 mg/dL (ref 8.4–10.5)
Chloride: 103 meq/L (ref 96–112)
Creatinine, Ser: 1 mg/dL (ref 0.40–1.20)
GFR: 53.36 mL/min — ABNORMAL LOW (ref >60.00)
Glucose, Bld: 111 mg/dL — ABNORMAL HIGH (ref 70–99)
Potassium: 3.8 meq/L (ref 3.5–5.1)
Sodium: 138 meq/L (ref 135–145)

## 2023-05-16 LAB — LIPID PANEL
Cholesterol: 136 mg/dL (ref 0–200)
HDL: 40.9 mg/dL (ref >39.00)
LDL Cholesterol: 74 mg/dL (ref 0–99)
NonHDL: 95.25
Total CHOL/HDL Ratio: 3
Triglycerides: 106 mg/dL (ref 0.0–149.0)
VLDL: 21.2 mg/dL (ref 0.0–40.0)

## 2023-05-16 LAB — HEMOGLOBIN A1C: Hgb A1c MFr Bld: 6.2 % (ref 4.6–6.5)

## 2023-05-19 ENCOUNTER — Encounter: Payer: Self-pay | Admitting: Family Medicine

## 2023-05-19 DIAGNOSIS — R051 Acute cough: Secondary | ICD-10-CM | POA: Insufficient documentation

## 2023-05-19 DIAGNOSIS — R058 Other specified cough: Secondary | ICD-10-CM | POA: Insufficient documentation

## 2023-05-19 NOTE — Progress Notes (Signed)
 Subjective:    Patient ID: Kaitlin Dean, female    DOB: 1945/11/17, 78 y.o.   MRN: [id number]  Patient here for  Chief Complaint  Patient presents with   Medical Management of Chronic Issues    HPI Here for a scheduled follow up - follow up regarding hypercholesterolemia, hypertension and diabetes. Was seen 05/06/23 - cough. Treated with cough medication, flonase , astelin  and albuterol  inhaler. She is doing better. Symptoms have essentially resolved. Feels back to her baseline. No chest pain or sob. No abdominal pain or bowel change. Up to date with eye checks. Discussed labs.     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)    Shoulder fracture, right 11/07/2018   Skin cancer    Past Surgical History:  Procedure Laterality Date   BREAST EXCISIONAL BIOPSY Right 2000   benign   BREAST SURGERY  2000   Biopsy   CATARACT EXTRACTION W/PHACO Right 12/05/2016   Procedure: CATARACT EXTRACTION PHACO AND INTRAOCULAR LENS PLACEMENT (IOC) RIGHT SYMFONY LENS;  Surgeon: Annell Kidney, MD;  Location: [facility];  Service: Ophthalmology;  Laterality: Right;   CATARACT EXTRACTION W/PHACO Left 02/06/2017   Procedure: CATARACT EXTRACTION PHACO AND INTRAOCULAR LENS PLACEMENT (IOC) LEFT SYMFONY LENS;  Surgeon: Annell Kidney, MD;  Location: [facility];  Service: Ophthalmology;  Laterality: Left;   COLONOSCOPY     ESOPHAGOGASTRODUODENOSCOPY      ESOPHAGOGASTRODUODENOSCOPY (EGD) WITH PROPOFOL  N/A 06/11/2016   Procedure: ESOPHAGOGASTRODUODENOSCOPY (EGD) WITH PROPOFOL ;  Surgeon: Cassie Click, MD;  Location: [facility];  Service: Endoscopy;  Laterality: N/A;   ESOPHAGOGASTRODUODENOSCOPY (EGD) WITH PROPOFOL  N/A 05/25/2019   Procedure: ESOPHAGOGASTRODUODENOSCOPY (EGD) WITH BIOPSY;  Surgeon: Marnee Sink, MD;  Location: [facility];  Service: Endoscopy;  Laterality: N/A;  Priority 3   JOINT REPLACEMENT Left    knee   PTOSIS REPAIR Bilateral 06/01/2014   Procedure: PTOSIS REPAIR;  Surgeon: Zacarias Hermann, MD;  Location: [facility];  Service: Ophthalmology;  Laterality: Bilateral;   SKIN CANCER EXCISION     TOTAL KNEE ARTHROPLASTY Left 04/30/2012   Procedure: TOTAL KNEE ARTHROPLASTY;  Surgeon: Aurther Blue, MD;  Location: [facility];  Service: Orthopedics;  Laterality: Left;   TUBAL LIGATION     Family History  Problem Relation Age of Onset   Hypertension Mother    Congestive Heart Failure Mother    Stroke Father    Stroke Brother    Heart failure Brother    Sarcoidosis Sister        died of kidney failure   Breast cancer Neg Hx    Colon cancer Neg Hx    Social History   Socioeconomic History   Marital status: Widowed    Spouse name: Not on file   Number of children: 2   Years of education: Not on file   Highest education level:  Bachelor's degree (e.g., BA, AB, BS)  Occupational History    Comment: Retired Insurance account manager  Tobacco Use   Smoking status: Never   Smokeless tobacco: Never  Vaping Use   Vaping status: Never Used  Substance and Sexual Activity   Alcohol use: Yes    Alcohol/week: 1.0 standard drink of alcohol    Types: 1 Glasses of wine per week    Comment: SOCIALLY   Drug use: No   Sexual activity: Not Currently  Other Topics Concern   Not on file  Social History Narrative   Patient is a widow and she is a Buyer, retail of [employer]   Retired Microbiologist in the  [employer]   1 son and 1 daughter, 1 son is a Forensic scientist working for [employer] and the daughter lives in [place]   Rare alcohol non-smoker   Social Drivers of Corporate investment banker Strain: Low Risk  (03/12/2023)   Overall Financial Resource Strain (CARDIA)    Difficulty of Paying Living Expenses: Not hard at all  Food Insecurity: No Food Insecurity (03/12/2023)   Hunger Vital Sign    Worried About Running Out of Food in the Last Year: Never true    Ran Out of Food in the Last Year: Never true  Transportation Needs: No Transportation Needs (03/12/2023)   PRAPARE - Administrator, Civil Service (Medical): No    Lack of Transportation (Non-Medical): No  Physical Activity: Sufficiently Active (03/12/2023)   Exercise Vital Sign    Days of Exercise per Week: 5 days    Minutes of Exercise per Session: 50 min  Stress: No Stress Concern Present (03/12/2023)   [employer] of Occupational Health - Occupational Stress Questionnaire    Feeling of Stress : Not at all  Social Connections: Moderately Integrated (03/12/2023)   Social Connection and Isolation Panel [NHANES]    Frequency of Communication with Friends and Family: More than three times a week    Frequency of Social Gatherings with Friends and Family: More than three times a week    Attends Religious Services: More than 4 times per year    Active Member of [employer] or Organizations: Yes    Attends Banker Meetings: More than 4 times per year    Marital Status: Widowed     Review of Systems  Constitutional:  Negative for appetite change and unexpected weight change.  HENT:  Negative for congestion and sinus pressure.   Respiratory:  Negative for cough, chest tightness and shortness of breath.   Cardiovascular:  Negative for chest pain, palpitations and leg swelling.  Gastrointestinal:  Negative for abdominal pain, diarrhea, nausea and vomiting.  Genitourinary:  Negative for difficulty  urinating and dysuria.  Musculoskeletal:  Negative for joint swelling and myalgias.  Skin:  Negative for color change and rash.  Neurological:  Negative for dizziness and headaches.  Psychiatric/Behavioral:  Negative for agitation and dysphoric mood.        Objective:     BP 120/72   Pulse 69   Temp 98 F (36.7 C)   Resp 16   Ht 5\' 3"  (1.6 m)   Wt 174 lb 9.6 oz (79.2 kg)   LMP 01/29/1993   SpO2 98%   BMI 30.93 kg/m  Wt Readings from Last 3 Encounters:  05/20/23 174 lb 9.6 oz (79.2 kg)  05/06/23 175 lb 8 oz (79.6 kg)  03/13/23 175 lb (79.4 kg)    Physical Exam Vitals  reviewed.  Constitutional:      General: She is not in acute distress.    Appearance: Normal appearance.  HENT:     Head: Normocephalic and atraumatic.     Right Ear: External ear normal.     Left Ear: External ear normal.     Mouth/Throat:     Pharynx: No oropharyngeal exudate or posterior oropharyngeal erythema.  Eyes:     General: No scleral icterus.       Right eye: No discharge.        Left eye: No discharge.     Conjunctiva/sclera: Conjunctivae normal.  Neck:     Thyroid : No thyromegaly.  Cardiovascular:     Rate and Rhythm: Normal rate and regular rhythm.  Pulmonary:     Effort: No respiratory distress.     Breath sounds: Normal breath sounds. No wheezing.  Abdominal:     General: Bowel sounds are normal.     Palpations: Abdomen is soft.     Tenderness: There is no abdominal tenderness.  Musculoskeletal:        General: No swelling or tenderness.     Cervical back: Neck supple. No tenderness.  Lymphadenopathy:     Cervical: No cervical adenopathy.  Skin:    Findings: No erythema or rash.  Neurological:     Mental Status: She is alert.  Psychiatric:        Mood and Affect: Mood normal.        Behavior: Behavior normal.         Outpatient Encounter Medications as of 05/20/2023  Medication Sig   albuterol  (VENTOLIN  HFA) 108 (90 Base) MCG/ACT inhaler Inhale 2 puffs into the  lungs every 6 (six) hours as needed for wheezing or shortness of breath.   Cholecalciferol (VITAMIN D3 PO) Take by mouth daily.   Multiple Vitamin (MULTIVITAMIN) capsule Take 1 capsule by mouth daily. AM   pantoprazole  (PROTONIX ) 40 MG tablet Take 1 tablet (40 mg total) by mouth 2 (two) times daily before a meal.   Probiotic Product (PROBIOTIC DAILY PO) Take by mouth daily.   vitamin B-12 (CYANOCOBALAMIN) 1000 MCG tablet Take 1,000 mcg by mouth daily.   Vitamin E 400 units TABS Take by mouth daily.   [DISCONTINUED] azelastine  (ASTELIN ) 0.1 % nasal spray Place 2 sprays into both nostrils 2 (two) times daily. Use in each nostril as directed   [DISCONTINUED] chlorpheniramine-HYDROcodone  (TUSSIONEX) 10-8 MG/5ML Take 5 mLs by mouth 2 (two) times daily.   [DISCONTINUED] fluticasone  (FLONASE ) 50 MCG/ACT nasal spray Place 2 sprays into both nostrils daily.   ezetimibe -simvastatin  (VYTORIN ) 10-40 MG tablet Take 1 tablet by mouth daily.   telmisartan  (MICARDIS ) 40 MG tablet Take 1 tablet (40 mg total) by mouth every morning.   [DISCONTINUED] ezetimibe -simvastatin  (VYTORIN ) 10-40 MG tablet Take 1 tablet by mouth daily.   [DISCONTINUED] telmisartan  (MICARDIS ) 40 MG tablet Take 1 tablet (40 mg total) by mouth every morning.   No facility-administered encounter medications on file as of 05/20/2023.     Lab Results  Component Value Date   WBC 5.0 08/27/2022   HGB 14.2 08/27/2022   HCT 43.0 08/27/2022   PLT 255.0 08/27/2022   GLUCOSE 111 (H) 05/16/2023   CHOL 136 05/16/2023   TRIG 106.0 05/16/2023   HDL 40.90 05/16/2023   LDLDIRECT 182.4 02/05/2012   LDLCALC 74 05/16/2023   ALT 35 05/16/2023   AST 31 05/16/2023   NA 138 05/16/2023   K 3.8 05/16/2023   CL 103 05/16/2023  CREATININE 1.00 05/16/2023   BUN 20 05/16/2023   CO2 26 05/16/2023   TSH 4.86 01/01/2023   INR 0.99 04/22/2012   HGBA1C 6.2 05/16/2023   MICROALBUR <0.7 01/01/2023    MM 3D SCREENING MAMMOGRAM BILATERAL BREAST Result  Date: 12/11/2022 CLINICAL DATA:  Screening. EXAM: DIGITAL SCREENING BILATERAL MAMMOGRAM WITH TOMOSYNTHESIS AND CAD TECHNIQUE: Bilateral screening digital craniocaudal and mediolateral oblique mammograms were obtained. Bilateral screening digital breast tomosynthesis was performed. The images were evaluated with computer-aided detection. COMPARISON:  Previous exam(s). ACR Breast Density Category b: There are scattered areas of fibroglandular density. FINDINGS: There are no findings suspicious for malignancy. IMPRESSION: No mammographic evidence of malignancy. A result letter of this screening mammogram will be mailed directly to the patient. RECOMMENDATION: Screening mammogram in one year. (Code:SM-B-01Y) BI-RADS CATEGORY  1: Negative. Electronically Signed   By: Amanda Jungling M.D.   On: 12/11/2022 09:49       Assessment & Plan:  Aortic atherosclerosis (HCC) Assessment & Plan: Continue vytorin .   Hypercholesterolemia Assessment & Plan: Continue vytorin .  Low cholesterol diet and exercise. Follow lipid panel and liver function tests.  Discussed recent labs today.  Lab Results  Component Value Date   CHOL 136 05/16/2023   HDL 40.90 05/16/2023   LDLCALC 74 05/16/2023   LDLDIRECT 182.4 02/05/2012   TRIG 106.0 05/16/2023   CHOLHDL 3 05/16/2023     Orders: -     Basic metabolic panel with GFR; Future -     Hepatic function panel; Future -     Lipid panel; Future -     CBC with Differential/Platelet; Future  Type 2 diabetes mellitus with hyperglycemia, without long-term current use of insulin (HCC) Assessment & Plan: Low carb diet and exercise. Has adjusted diet.  Has lost weight. Follow met b and A1c.   Lab Results  Component Value Date   HGBA1C 6.2 05/16/2023     Orders: -     Hemoglobin A1c; Future  Barrett's esophagus without dysplasia Assessment & Plan: EGD 04/2019.  Previously followed by GI - Dr Ole Berkeley. On protonix .  Upper symptoms controlled on protonix  as outlined.  Continue.   No symptoms now.  Will need to continue f/u with GI   Stage 3a chronic kidney disease (HCC) Assessment & Plan: Continue micardis .  Stay hydrated.  Avoid antiinflammatory medication.  Follow met b.  Recent GFR 53 - stable.    Primary hypertension Assessment & Plan: Continue micardis .  Blood pressure doing well.  Follow pressures.  Follow metabolic panel.    Other orders -     Ezetimibe -Simvastatin ; Take 1 tablet by mouth daily.  Dispense: 90 tablet; Refill: 1 -     Telmisartan ; Take 1 tablet (40 mg total) by mouth every morning.  Dispense: 90 tablet; Refill: 1     Dellar Fenton, MD

## 2023-05-19 NOTE — Assessment & Plan Note (Signed)
 Likely viral cough, possibly reactive to weather or allergens. No bacterial infection. Asthma stable, no exacerbation. - Refilled albuterol  inhaler, instructed two puffs every six hours as needed for wheezing or dyspnea. - Prescribed hydrocodone -containing cough medicine. - Consider Tessalon  Perles if current treatment ineffective. - Recommended nasal sprays such as Flonase  or Astelin  for congestion. - Continue saline nasal spray.

## 2023-05-20 ENCOUNTER — Ambulatory Visit: Admitting: Internal Medicine

## 2023-05-20 ENCOUNTER — Encounter: Payer: Self-pay | Admitting: Internal Medicine

## 2023-05-20 VITALS — BP 120/72 | HR 69 | Temp 98.0°F | Resp 16 | Ht 63.0 in | Wt 174.6 lb

## 2023-05-20 DIAGNOSIS — I10 Essential (primary) hypertension: Secondary | ICD-10-CM | POA: Diagnosis not present

## 2023-05-20 DIAGNOSIS — E1165 Type 2 diabetes mellitus with hyperglycemia: Secondary | ICD-10-CM

## 2023-05-20 DIAGNOSIS — K2270 Barrett's esophagus without dysplasia: Secondary | ICD-10-CM | POA: Diagnosis not present

## 2023-05-20 DIAGNOSIS — N1831 Chronic kidney disease, stage 3a: Secondary | ICD-10-CM

## 2023-05-20 DIAGNOSIS — I700 Atherosclerosis of aorta: Secondary | ICD-10-CM | POA: Diagnosis not present

## 2023-05-20 DIAGNOSIS — E7800 Pure hypercholesterolemia, unspecified: Secondary | ICD-10-CM | POA: Diagnosis not present

## 2023-05-20 MED ORDER — TELMISARTAN 40 MG PO TABS: 40.0000 mg | ORAL_TABLET | Freq: Every morning | ORAL | 1 refills | Status: DC

## 2023-05-20 MED ORDER — EZETIMIBE-SIMVASTATIN 10-40 MG PO TABS: 1.0000 | ORAL_TABLET | Freq: Every day | ORAL | 1 refills | Status: DC

## 2023-05-20 NOTE — Assessment & Plan Note (Signed)
 Continue vytorin .  Low cholesterol diet and exercise. Follow lipid panel and liver function tests.  Discussed recent labs today.  Lab Results  Component Value Date   CHOL 136 05/16/2023   HDL 40.90 05/16/2023   LDLCALC 74 05/16/2023   LDLDIRECT 182.4 02/05/2012   TRIG 106.0 05/16/2023   CHOLHDL 3 05/16/2023

## 2023-05-20 NOTE — Assessment & Plan Note (Signed)
 EGD 04/2019.  Previously followed by GI - Dr Ole Berkeley. On protonix .  Upper symptoms controlled on protonix  as outlined.  Continue.  No symptoms now.  Will need to continue f/u with GI

## 2023-05-20 NOTE — Assessment & Plan Note (Signed)
 Low carb diet and exercise. Has adjusted diet.  Has lost weight. Follow met b and A1c.   Lab Results  Component Value Date   HGBA1C 6.2 05/16/2023

## 2023-05-20 NOTE — Assessment & Plan Note (Signed)
Continue vytorin.   

## 2023-05-20 NOTE — Assessment & Plan Note (Signed)
 Continue micardis .  Stay hydrated.  Avoid antiinflammatory medication.  Follow met b.  Recent GFR 53 - stable.

## 2023-05-20 NOTE — Assessment & Plan Note (Signed)
Continue micardis.  Blood pressure doing well.  Follow pressures.  Follow metabolic panel.   

## 2023-06-05 DIAGNOSIS — L570 Actinic keratosis: Secondary | ICD-10-CM | POA: Diagnosis not present

## 2023-06-05 DIAGNOSIS — Z85828 Personal history of other malignant neoplasm of skin: Secondary | ICD-10-CM | POA: Diagnosis not present

## 2023-06-05 DIAGNOSIS — D485 Neoplasm of uncertain behavior of skin: Secondary | ICD-10-CM | POA: Diagnosis not present

## 2023-06-05 DIAGNOSIS — D2262 Melanocytic nevi of left upper limb, including shoulder: Secondary | ICD-10-CM | POA: Diagnosis not present

## 2023-06-05 DIAGNOSIS — D2271 Melanocytic nevi of right lower limb, including hip: Secondary | ICD-10-CM | POA: Diagnosis not present

## 2023-06-05 DIAGNOSIS — C44619 Basal cell carcinoma of skin of left upper limb, including shoulder: Secondary | ICD-10-CM | POA: Diagnosis not present

## 2023-06-05 DIAGNOSIS — D2272 Melanocytic nevi of left lower limb, including hip: Secondary | ICD-10-CM | POA: Diagnosis not present

## 2023-06-05 DIAGNOSIS — D2261 Melanocytic nevi of right upper limb, including shoulder: Secondary | ICD-10-CM | POA: Diagnosis not present

## 2023-09-16 ENCOUNTER — Other Ambulatory Visit (INDEPENDENT_AMBULATORY_CARE_PROVIDER_SITE_OTHER)

## 2023-09-16 DIAGNOSIS — E1165 Type 2 diabetes mellitus with hyperglycemia: Secondary | ICD-10-CM

## 2023-09-16 DIAGNOSIS — E7800 Pure hypercholesterolemia, unspecified: Secondary | ICD-10-CM | POA: Diagnosis not present

## 2023-09-16 LAB — CBC WITH DIFFERENTIAL/PLATELET
Basophils Absolute: 0 K/uL (ref 0.0–0.1)
Basophils Relative: 0.4 % (ref 0.0–3.0)
Eosinophils Absolute: 0.2 K/uL (ref 0.0–0.7)
Eosinophils Relative: 3.6 % (ref 0.0–5.0)
HCT: 41.7 % (ref 36.0–46.0)
Hemoglobin: 14 g/dL (ref 12.0–15.0)
Lymphocytes Relative: 29.1 % (ref 12.0–46.0)
Lymphs Abs: 1.3 K/uL (ref 0.7–4.0)
MCHC: 33.7 g/dL (ref 30.0–36.0)
MCV: 92.3 fl (ref 78.0–100.0)
Monocytes Absolute: 0.4 K/uL (ref 0.1–1.0)
Monocytes Relative: 7.8 % (ref 3.0–12.0)
Neutro Abs: 2.7 K/uL (ref 1.4–7.7)
Neutrophils Relative %: 59.1 % (ref 43.0–77.0)
Platelets: 202 K/uL (ref 150.0–400.0)
RBC: 4.51 Mil/uL (ref 3.87–5.11)
RDW: 13.1 % (ref 11.5–15.5)
WBC: 4.6 K/uL (ref 4.0–10.5)

## 2023-09-16 LAB — BASIC METABOLIC PANEL WITH GFR
BUN: 18 mg/dL (ref 6–23)
CO2: 27 meq/L (ref 19–32)
Calcium: 9.2 mg/dL (ref 8.4–10.5)
Chloride: 106 meq/L (ref 96–112)
Creatinine, Ser: 0.88 mg/dL (ref 0.40–1.20)
GFR: 62.06 mL/min (ref >60.00)
Glucose, Bld: 114 mg/dL — ABNORMAL HIGH (ref 70–99)
Potassium: 4 meq/L (ref 3.5–5.1)
Sodium: 141 meq/L (ref 135–145)

## 2023-09-16 LAB — LIPID PANEL
Cholesterol: 136 mg/dL (ref 0–200)
HDL: 39.7 mg/dL (ref >39.00)
LDL Cholesterol: 71 mg/dL (ref 0–99)
NonHDL: 95.94
Total CHOL/HDL Ratio: 3
Triglycerides: 126 mg/dL (ref 0.0–149.0)
VLDL: 25.2 mg/dL (ref 0.0–40.0)

## 2023-09-16 LAB — HEPATIC FUNCTION PANEL
ALT: 58 U/L — ABNORMAL HIGH (ref 0–35)
AST: 47 U/L — ABNORMAL HIGH (ref 0–37)
Albumin: 4.3 g/dL (ref 3.5–5.2)
Alkaline Phosphatase: 76 U/L (ref 39–117)
Bilirubin, Direct: 0.2 mg/dL (ref 0.0–0.3)
Total Bilirubin: 0.9 mg/dL (ref 0.2–1.2)
Total Protein: 6.3 g/dL (ref 6.0–8.3)

## 2023-09-16 LAB — HEMOGLOBIN A1C: Hgb A1c MFr Bld: 6.4 % (ref 4.6–6.5)

## 2023-09-17 ENCOUNTER — Ambulatory Visit: Payer: Self-pay | Admitting: Internal Medicine

## 2023-09-17 NOTE — Telephone Encounter (Signed)
 Copied from CRM #[id number]. Topic: Clinical - Lab/Test Results >> Sep 17, 2023  9:37 AM Deaijah H wrote: Reason for CRM: Patient called in to return Elizabeth's call

## 2023-09-20 ENCOUNTER — Ambulatory Visit: Admitting: Internal Medicine

## 2023-09-24 ENCOUNTER — Encounter: Payer: Self-pay | Admitting: Internal Medicine

## 2023-09-24 ENCOUNTER — Ambulatory Visit: Admitting: Internal Medicine

## 2023-09-24 VITALS — BP 118/70 | HR 74 | Resp 16 | Ht 63.0 in | Wt 175.2 lb

## 2023-09-24 DIAGNOSIS — E1165 Type 2 diabetes mellitus with hyperglycemia: Secondary | ICD-10-CM

## 2023-09-24 DIAGNOSIS — K219 Gastro-esophageal reflux disease without esophagitis: Secondary | ICD-10-CM

## 2023-09-24 DIAGNOSIS — I700 Atherosclerosis of aorta: Secondary | ICD-10-CM

## 2023-09-24 DIAGNOSIS — I10 Essential (primary) hypertension: Secondary | ICD-10-CM

## 2023-09-24 DIAGNOSIS — E7800 Pure hypercholesterolemia, unspecified: Secondary | ICD-10-CM | POA: Diagnosis not present

## 2023-09-24 DIAGNOSIS — K449 Diaphragmatic hernia without obstruction or gangrene: Secondary | ICD-10-CM | POA: Diagnosis not present

## 2023-09-24 DIAGNOSIS — R7989 Other specified abnormal findings of blood chemistry: Secondary | ICD-10-CM | POA: Diagnosis not present

## 2023-09-24 MED ORDER — TELMISARTAN 40 MG PO TABS: 40.0000 mg | ORAL_TABLET | Freq: Every morning | ORAL | 1 refills | Status: DC

## 2023-09-24 NOTE — Progress Notes (Signed)
 Subjective:    Patient ID: Kaitlin Dean, female    DOB: [birth year], 78 y.o.   MRN: [id number]  Patient here for  Chief Complaint  Patient presents with   Medical Management of Chronic Issues    HPI Here for a scheduled follow up - follow up regarding hypercholesterolemia, hypertension and diabetes. Reports recently noticing some fatigue. Also noticed after eating  - stomach ache. She was questioning if the cholesterol medication was contributing. Also concerned liver function elevated, due to cholesterol medication. Discussed. Has been on statin with no issues. Discussed possible viral syndrome, etc. Has stopped statin - since labs. Discussed labs. Discussed A1c 6.4. discussed risk reduction with statin. Feeling better. Breathing stable. No chest pain reported. No bowel issues reported.    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)    Shoulder fracture, right 11/07/2018   Skin cancer    Past Surgical History:  Procedure Laterality Date   BREAST EXCISIONAL BIOPSY Right 2000   benign   BREAST SURGERY  2000   Biopsy   CATARACT EXTRACTION W/PHACO Right 12/05/2016   Procedure: CATARACT EXTRACTION PHACO AND INTRAOCULAR LENS PLACEMENT (IOC) RIGHT SYMFONY LENS;  Surgeon: Mittie Gaskin, MD;  Location: [facility];  Service: Ophthalmology;  Laterality: Right;   CATARACT EXTRACTION W/PHACO Left 02/06/2017   Procedure: CATARACT EXTRACTION PHACO AND INTRAOCULAR LENS  PLACEMENT (IOC) LEFT SYMFONY LENS;  Surgeon: Mittie Gaskin, MD;  Location: [facility];  Service: Ophthalmology;  Laterality: Left;   COLONOSCOPY     ESOPHAGOGASTRODUODENOSCOPY     ESOPHAGOGASTRODUODENOSCOPY (EGD) WITH PROPOFOL  N/A 06/11/2016   Procedure: ESOPHAGOGASTRODUODENOSCOPY (EGD) WITH PROPOFOL ;  Surgeon: Viktoria Lamar DASEN, MD;  Location: [facility];  Service: Endoscopy;  Laterality: N/A;   ESOPHAGOGASTRODUODENOSCOPY (EGD) WITH PROPOFOL  N/A 05/25/2019   Procedure: ESOPHAGOGASTRODUODENOSCOPY (EGD) WITH BIOPSY;  Surgeon: Jinny Carmine, MD;  Location: [facility];  Service: Endoscopy;  Laterality: N/A;  Priority 3   JOINT REPLACEMENT Left    knee   PTOSIS REPAIR Bilateral 06/01/2014   Procedure: PTOSIS REPAIR;  Surgeon: Greig CHRISTELLA Gay, MD;  Location: [facility];  Service: Ophthalmology;  Laterality: Bilateral;   SKIN CANCER EXCISION     TOTAL KNEE ARTHROPLASTY Left 04/30/2012   Procedure: TOTAL KNEE ARTHROPLASTY;  Surgeon: Dempsey LULLA Moan, MD;  Location: [facility];  Service: Orthopedics;  Laterality: Left;   TUBAL LIGATION     Family History  Problem Relation Age of Onset   Hypertension Mother    Congestive Heart Failure Mother    Stroke Father    Stroke Brother    Heart failure Brother    Sarcoidosis Sister        died of kidney failure   Breast cancer Neg Hx    Colon cancer Neg Hx    Social History   Socioeconomic History   Marital status: Widowed  Spouse name: Not on file   Number of children: 2   Years of education: Not on file   Highest education level: Bachelor's degree (e.g., BA, AB, BS)  Occupational History    Comment: Retired Insurance account manager  Tobacco Use   Smoking status: Never   Smokeless tobacco: Never  Vaping Use   Vaping status: Never Used  Substance and Sexual Activity   Alcohol use: Yes    Alcohol/week: 1.0 standard drink of alcohol    Types: 1 Glasses of wine per week    Comment: SOCIALLY   Drug use: No   Sexual  activity: Not Currently  Other Topics Concern   Not on file  Social History Narrative   Patient is a widow and she is a Buyer, retail of [employer]   Retired Microbiologist in the [employer]   1 son and 1 daughter, 1 son is a Forensic scientist working for [employer] and the daughter lives in [place]   Rare alcohol non-smoker   Social Drivers of Corporate investment banker Strain: Low Risk  (09/22/2023)   Overall Financial Resource Strain (CARDIA)    Difficulty of Paying Living Expenses: Not hard at all  Food Insecurity: No Food Insecurity (09/22/2023)   Hunger Vital Sign    Worried About Running Out of Food in the Last Year: Never true    Ran Out of Food in the Last Year: Never true  Transportation Needs: No Transportation Needs (09/22/2023)   PRAPARE - Administrator, Civil Service (Medical): No    Lack of Transportation (Non-Medical): No  Physical Activity: Sufficiently Active (09/22/2023)   Exercise Vital Sign    Days of Exercise per Week: 5 days    Minutes of Exercise per Session: 30 min  Stress: No Stress Concern Present (09/22/2023)   [employer] of Occupational Health - Occupational Stress Questionnaire    Feeling of Stress: Not at all  Social Connections: Moderately Integrated (09/22/2023)   Social Connection and Isolation Panel    Frequency of Communication with Friends and Family: More than three times a week    Frequency of Social Gatherings with Friends and Family: More than three times a week    Attends Religious Services: More than 4 times per year    Active Member of [employer] or Organizations: Yes    Attends Banker Meetings: More than 4 times per year    Marital Status: Widowed     Review of Systems  Constitutional:  Negative for appetite change and unexpected weight change.  HENT:  Negative for congestion and sinus pressure.   Respiratory:  Negative for cough, chest tightness and shortness of breath.    Cardiovascular:  Negative for chest pain, palpitations and leg swelling.  Gastrointestinal:  Negative for abdominal pain, diarrhea, nausea and vomiting.  Genitourinary:  Negative for difficulty urinating and dysuria.  Musculoskeletal:  Negative for joint swelling and myalgias.  Skin:  Negative for color change and rash.  Neurological:  Negative for dizziness and headaches.  Psychiatric/Behavioral:  Negative for agitation and dysphoric mood.        Objective:     BP 118/70   Pulse 74   Resp 16   Ht 5' 3 (1.6 m)   Wt 175 lb 3.2 oz (79.5 kg)   LMP 01/29/1993   SpO2 98%   BMI 31.04 kg/m  Wt Readings from Last 3 Encounters:  09/24/23 175 lb 3.2 oz (79.5 kg)  05/20/23 174 lb 9.6  oz (79.2 kg)  05/06/23 175 lb 8 oz (79.6 kg)    Physical Exam Vitals reviewed.  Constitutional:      General: She is not in acute distress.    Appearance: Normal appearance.  HENT:     Head: Normocephalic and atraumatic.     Right Ear: External ear normal.     Left Ear: External ear normal.     Mouth/Throat:     Pharynx: No oropharyngeal exudate or posterior oropharyngeal erythema.  Eyes:     General: No scleral icterus.       Right eye: No discharge.        Left eye: No discharge.     Conjunctiva/sclera: Conjunctivae normal.  Neck:     Thyroid : No thyromegaly.  Cardiovascular:     Rate and Rhythm: Normal rate and regular rhythm.  Pulmonary:     Effort: No respiratory distress.     Breath sounds: Normal breath sounds. No wheezing.  Abdominal:     General: Bowel sounds are normal.     Palpations: Abdomen is soft.     Tenderness: There is no abdominal tenderness.  Musculoskeletal:        General: No swelling or tenderness.     Cervical back: Neck supple. No tenderness.  Lymphadenopathy:     Cervical: No cervical adenopathy.  Skin:    Findings: No erythema or rash.  Neurological:     Mental Status: She is alert.  Psychiatric:        Mood and Affect: Mood normal.        Behavior:  Behavior normal.         Outpatient Encounter Medications as of 09/24/2023  Medication Sig   albuterol  (VENTOLIN  HFA) 108 (90 Base) MCG/ACT inhaler Inhale 2 puffs into the lungs every 6 (six) hours as needed for wheezing or shortness of breath.   Cholecalciferol (VITAMIN D3 PO) Take by mouth daily.   ezetimibe -simvastatin  (VYTORIN ) 10-40 MG tablet Take 1 tablet by mouth daily.   Multiple Vitamin (MULTIVITAMIN) capsule Take 1 capsule by mouth daily. AM   pantoprazole  (PROTONIX ) 40 MG tablet Take 1 tablet (40 mg total) by mouth 2 (two) times daily before a meal.   Probiotic Product (PROBIOTIC DAILY PO) Take by mouth daily.   telmisartan  (MICARDIS ) 40 MG tablet Take 1 tablet (40 mg total) by mouth every morning.   vitamin B-12 (CYANOCOBALAMIN) 1000 MCG tablet Take 1,000 mcg by mouth daily.   Vitamin E 400 units TABS Take by mouth daily.   [DISCONTINUED] telmisartan  (MICARDIS ) 40 MG tablet Take 1 tablet (40 mg total) by mouth every morning.   No facility-administered encounter medications on file as of 09/24/2023.     Lab Results  Component Value Date   WBC 4.6 09/16/2023   HGB 14.0 09/16/2023   HCT 41.7 09/16/2023   PLT 202.0 09/16/2023   GLUCOSE 114 (H) 09/16/2023   CHOL 136 09/16/2023   TRIG 126.0 09/16/2023   HDL 39.70 09/16/2023   LDLDIRECT 182.4 02/05/2012   LDLCALC 71 09/16/2023   ALT 34 09/24/2023   AST 26 09/24/2023   NA 141 09/16/2023   K 4.0 09/16/2023   CL 106 09/16/2023   CREATININE 0.88 09/16/2023   BUN 18 09/16/2023   CO2 27 09/16/2023   TSH 4.86 01/01/2023   INR 0.99 04/22/2012   HGBA1C 6.4 09/16/2023    MM 3D SCREENING MAMMOGRAM BILATERAL BREAST Result Date: 12/11/2022 CLINICAL DATA:  Screening. EXAM: DIGITAL SCREENING BILATERAL MAMMOGRAM WITH TOMOSYNTHESIS AND CAD  TECHNIQUE: Bilateral screening digital craniocaudal and mediolateral oblique mammograms were obtained. Bilateral screening digital breast tomosynthesis was performed. The images were evaluated  with computer-aided detection. COMPARISON:  Previous exam(s). ACR Breast Density Category b: There are scattered areas of fibroglandular density. FINDINGS: There are no findings suspicious for malignancy. IMPRESSION: No mammographic evidence of malignancy. A result letter of this screening mammogram will be mailed directly to the patient. RECOMMENDATION: Screening mammogram in one year. (Code:SM-B-01Y) BI-RADS CATEGORY  1: Negative. Electronically Signed   By: Alm Parkins M.D.   On: 12/11/2022 09:49       Assessment & Plan:  Abnormal liver function tests Assessment & Plan: Discussed. Continue diet and exercise. She is concerned - related to statin. Discussed possible fatty liver. Discussed possible viral syndrome. Recheck liver panel today.   Orders: -     Hepatic function panel  Hypercholesterolemia Assessment & Plan: Continue vytorin .  Low cholesterol diet and exercise. Follow lipid panel and liver function tests.  Discussed recent labs. Discussed recommendation for statin therapy.  Off now. Follow.  Lab Results  Component Value Date   CHOL 136 09/16/2023   HDL 39.70 09/16/2023   LDLCALC 71 09/16/2023   LDLDIRECT 182.4 02/05/2012   TRIG 126.0 09/16/2023   CHOLHDL 3 09/16/2023     Orders: -     Basic metabolic panel with GFR; Future -     Hepatic function panel; Future -     Lipid panel; Future -     TSH; Future  Type 2 diabetes mellitus with hyperglycemia, without long-term current use of insulin (HCC) Assessment & Plan: Low carb diet and exercise. Discussed A1c. Discussed recommendation for statin. Continue diet and exercise. Follow met b and A1c.  Lab Results  Component Value Date   HGBA1C 6.4 09/16/2023     Orders: -     Hemoglobin A1c; Future -     Microalbumin / creatinine urine ratio; Future  Primary hypertension Assessment & Plan: Continue micardis .  Blood pressure doing well.  Follow pressures. Recent GFR improved. Follow metabolic panel. No changes in  medication today.    Hiatal hernia with GERD Assessment & Plan: Has been doing well on protonix . Recent stomach ache after eating. Discussed. Better now. Off statin. Follow. Notify if return or symptoms.    Aortic atherosclerosis (HCC) Assessment & Plan: Off statin. Follow.    Other orders -     Telmisartan ; Take 1 tablet (40 mg total) by mouth every morning.  Dispense: 90 tablet; Refill: 1     Allena Hamilton, MD

## 2023-09-25 LAB — HEPATIC FUNCTION PANEL
ALT: 34 U/L (ref 0–35)
AST: 26 U/L (ref 0–37)
Albumin: 4.3 g/dL (ref 3.5–5.2)
Alkaline Phosphatase: 80 U/L (ref 39–117)
Bilirubin, Direct: 0.1 mg/dL (ref 0.0–0.3)
Total Bilirubin: 0.8 mg/dL (ref 0.2–1.2)
Total Protein: 6.7 g/dL (ref 6.0–8.3)

## 2023-09-26 ENCOUNTER — Ambulatory Visit: Payer: Self-pay | Admitting: Internal Medicine

## 2023-09-30 ENCOUNTER — Encounter: Payer: Self-pay | Admitting: Internal Medicine

## 2023-09-30 NOTE — Assessment & Plan Note (Signed)
 Low carb diet and exercise. Discussed A1c. Discussed recommendation for statin. Continue diet and exercise. Follow met b and A1c.  Lab Results  Component Value Date   HGBA1C 6.4 09/16/2023

## 2023-09-30 NOTE — Assessment & Plan Note (Signed)
 Continue micardis .  Blood pressure doing well.  Follow pressures. Recent GFR improved. Follow metabolic panel. No changes in medication today.

## 2023-09-30 NOTE — Assessment & Plan Note (Signed)
 Has been doing well on protonix . Recent stomach ache after eating. Discussed. Better now. Off statin. Follow. Notify if return or symptoms.

## 2023-09-30 NOTE — Assessment & Plan Note (Signed)
 Continue vytorin .  Low cholesterol diet and exercise. Follow lipid panel and liver function tests.  Discussed recent labs. Discussed recommendation for statin therapy.  Off now. Follow.  Lab Results  Component Value Date   CHOL 136 09/16/2023   HDL 39.70 09/16/2023   LDLCALC 71 09/16/2023   LDLDIRECT 182.4 02/05/2012   TRIG 126.0 09/16/2023   CHOLHDL 3 09/16/2023

## 2023-09-30 NOTE — Assessment & Plan Note (Signed)
 Discussed. Continue diet and exercise. She is concerned - related to statin. Discussed possible fatty liver. Discussed possible viral syndrome. Recheck liver panel today.

## 2023-09-30 NOTE — Assessment & Plan Note (Signed)
Off statin.  Follow.

## 2023-10-02 DIAGNOSIS — C44619 Basal cell carcinoma of skin of left upper limb, including shoulder: Secondary | ICD-10-CM | POA: Diagnosis not present

## 2023-10-16 DIAGNOSIS — D2271 Melanocytic nevi of right lower limb, including hip: Secondary | ICD-10-CM | POA: Diagnosis not present

## 2023-10-16 DIAGNOSIS — Z85828 Personal history of other malignant neoplasm of skin: Secondary | ICD-10-CM | POA: Diagnosis not present

## 2023-10-16 DIAGNOSIS — D2261 Melanocytic nevi of right upper limb, including shoulder: Secondary | ICD-10-CM | POA: Diagnosis not present

## 2023-10-16 DIAGNOSIS — D2272 Melanocytic nevi of left lower limb, including hip: Secondary | ICD-10-CM | POA: Diagnosis not present

## 2023-10-16 DIAGNOSIS — D2262 Melanocytic nevi of left upper limb, including shoulder: Secondary | ICD-10-CM | POA: Diagnosis not present

## 2023-10-16 DIAGNOSIS — L570 Actinic keratosis: Secondary | ICD-10-CM | POA: Diagnosis not present

## 2023-10-23 NOTE — Progress Notes (Signed)
 Kaitlin Dean                                          MRN: [id number]   10/23/2023   The [facility] Quality Team Specialist reviewed this patient medical record for the purposes of chart review for care gap closure. The following were reviewed: chart review for care gap closure-kidney health evaluation for diabetes:eGFR  and uACR.    [facility] Quality Team

## 2023-12-10 ENCOUNTER — Encounter: Payer: Self-pay | Admitting: Pharmacist

## 2023-12-10 ENCOUNTER — Other Ambulatory Visit: Payer: Self-pay

## 2023-12-10 MED ORDER — EZETIMIBE-SIMVASTATIN 10-40 MG PO TABS: 1.0000 | ORAL_TABLET | Freq: Every day | ORAL | 1 refills | Status: DC

## 2023-12-10 NOTE — Progress Notes (Signed)
 Pharmacy Quality Measure Review  This patient is appearing on a report for being at risk of failing the adherence measure for cholesterol (statin) medications this calendar year.   Medication: simvastatin /ezetimibe   Last fill date: 07/24/23 for 90 day supply 0 refills remaining  Will collaborate with provider to facilitate refill needs. PCP clinical pool messaged.   Next scheduled follow up with PCP 02/07/23.   Future Appointments  Date Time Provider Department Center  02/05/2024  8:00 AM [facility] [address]  02/07/2024  9:00 AM Glendia Shad, MD [facility] [address]  03/16/2024  8:10 AM [facility] ANNUAL WELLNESS VISIT [facility] [address]

## 2023-12-10 NOTE — Progress Notes (Signed)
 Refill sent.

## 2024-01-20 ENCOUNTER — Other Ambulatory Visit: Payer: Self-pay | Admitting: Internal Medicine

## 2024-02-05 ENCOUNTER — Other Ambulatory Visit

## 2024-02-05 DIAGNOSIS — E1165 Type 2 diabetes mellitus with hyperglycemia: Secondary | ICD-10-CM

## 2024-02-05 DIAGNOSIS — E7800 Pure hypercholesterolemia, unspecified: Secondary | ICD-10-CM

## 2024-02-05 LAB — MICROALBUMIN / CREATININE URINE RATIO
Creatinine,U: 64.4 mg/dL
Microalb Creat Ratio: UNDETERMINED mg/g (ref 0.0–30.0)
Microalb, Ur: <0.7 mg/dL

## 2024-02-05 LAB — HEPATIC FUNCTION PANEL
ALT: 19 U/L (ref 3–35)
AST: 23 U/L (ref 5–37)
Albumin: 4.3 g/dL (ref 3.5–5.2)
Alkaline Phosphatase: 77 U/L (ref 39–117)
Bilirubin, Direct: 0.1 mg/dL (ref 0.1–0.3)
Total Bilirubin: 0.8 mg/dL (ref 0.2–1.2)
Total Protein: 6.7 g/dL (ref 6.0–8.3)

## 2024-02-05 LAB — LIPID PANEL
Cholesterol: 283 mg/dL — ABNORMAL HIGH (ref 28–200)
HDL: 56.9 mg/dL
LDL Cholesterol: 196 mg/dL — ABNORMAL HIGH (ref 10–99)
NonHDL: 225.95
Total CHOL/HDL Ratio: 5
Triglycerides: 149 mg/dL (ref 10.0–149.0)
VLDL: 29.8 mg/dL (ref 0.0–40.0)

## 2024-02-05 LAB — BASIC METABOLIC PANEL WITH GFR
BUN: 25 mg/dL — ABNORMAL HIGH (ref 6–23)
CO2: 26 meq/L (ref 19–32)
Calcium: 9.4 mg/dL (ref 8.4–10.5)
Chloride: 105 meq/L (ref 96–112)
Creatinine, Ser: 0.95 mg/dL (ref 0.40–1.20)
GFR: 56.46 mL/min — ABNORMAL LOW
Glucose, Bld: 109 mg/dL — ABNORMAL HIGH (ref 70–99)
Potassium: 3.9 meq/L (ref 3.5–5.1)
Sodium: 140 meq/L (ref 135–145)

## 2024-02-05 LAB — HEMOGLOBIN A1C: Hgb A1c MFr Bld: 6.1 % (ref 4.6–6.5)

## 2024-02-05 LAB — TSH: TSH: 6.65 u[IU]/mL — ABNORMAL HIGH (ref 0.35–5.50)

## 2024-02-07 ENCOUNTER — Encounter: Payer: Self-pay | Admitting: Internal Medicine

## 2024-02-07 ENCOUNTER — Other Ambulatory Visit: Payer: Self-pay | Admitting: Internal Medicine

## 2024-02-07 ENCOUNTER — Ambulatory Visit: Admitting: Internal Medicine

## 2024-02-07 VITALS — BP 130/80 | HR 84 | Temp 98.2°F | Ht 63.0 in | Wt 181.0 lb

## 2024-02-07 DIAGNOSIS — Z23 Encounter for immunization: Secondary | ICD-10-CM

## 2024-02-07 DIAGNOSIS — E7800 Pure hypercholesterolemia, unspecified: Secondary | ICD-10-CM | POA: Diagnosis not present

## 2024-02-07 DIAGNOSIS — Z1231 Encounter for screening mammogram for malignant neoplasm of breast: Secondary | ICD-10-CM

## 2024-02-07 DIAGNOSIS — E1165 Type 2 diabetes mellitus with hyperglycemia: Secondary | ICD-10-CM

## 2024-02-07 DIAGNOSIS — I10 Essential (primary) hypertension: Secondary | ICD-10-CM

## 2024-02-07 DIAGNOSIS — K449 Diaphragmatic hernia without obstruction or gangrene: Secondary | ICD-10-CM

## 2024-02-07 DIAGNOSIS — K2270 Barrett's esophagus without dysplasia: Secondary | ICD-10-CM

## 2024-02-07 DIAGNOSIS — K219 Gastro-esophageal reflux disease without esophagitis: Secondary | ICD-10-CM | POA: Diagnosis not present

## 2024-02-07 DIAGNOSIS — Z0000 Encounter for general adult medical examination without abnormal findings: Secondary | ICD-10-CM

## 2024-02-07 DIAGNOSIS — K2100 Gastro-esophageal reflux disease with esophagitis, without bleeding: Secondary | ICD-10-CM | POA: Diagnosis not present

## 2024-02-07 DIAGNOSIS — E1122 Type 2 diabetes mellitus with diabetic chronic kidney disease: Secondary | ICD-10-CM

## 2024-02-07 DIAGNOSIS — N1831 Chronic kidney disease, stage 3a: Secondary | ICD-10-CM

## 2024-02-07 LAB — HM DIABETES FOOT EXAM

## 2024-02-07 MED ORDER — TELMISARTAN 40 MG PO TABS: 40.0000 mg | ORAL_TABLET | Freq: Every morning | ORAL | 1 refills | Status: AC

## 2024-02-07 MED ORDER — EZETIMIBE-SIMVASTATIN 10-20 MG PO TABS: 1.0000 | ORAL_TABLET | Freq: Every day | ORAL | 1 refills | Status: AC

## 2024-02-07 NOTE — Assessment & Plan Note (Addendum)
 Physical today 02/07/24. SABRA  PAP 10/05/20 - negative with negative HPV.  Mammogram 12/07/22 - Birads I. Overdue mammogram.  Scheduled for 03/05/24. Colonoscopy 10/2013.  Recommended f/u in 10 years.

## 2024-02-07 NOTE — Progress Notes (Signed)
 "  Subjective:    Patient ID: Kaitlin Dean, female    DOB: 11/12/1945, 79 y.o.   MRN: [id number]  Patient here for  Chief Complaint  Patient presents with   Medical Management of Chronic Issues   Annual Exam    HPI Here for a physical exam. Continues on micardis . Last visit, had stopped her cholesterol medication due to concern regarding GI symptoms. Discussed recent labs. LDL increased. Discussed with her today. Agreeable to restart with lower dose of statin medication. She is doing well. Staying active. No chest pain. Breathing stable. No abdominal pain or bowel change reported.    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)    Shoulder fracture, right 11/07/2018   Skin cancer    Past Surgical History:  Procedure Laterality Date   BREAST EXCISIONAL BIOPSY Right 2000   benign   BREAST SURGERY  2000   Biopsy   CATARACT EXTRACTION W/PHACO Right 12/05/2016   Procedure: CATARACT EXTRACTION PHACO AND INTRAOCULAR LENS PLACEMENT (IOC) RIGHT SYMFONY LENS;  Surgeon: Mittie Gaskin, MD;  Location: [facility];  Service: Ophthalmology;  Laterality: Right;   CATARACT EXTRACTION W/PHACO Left 02/06/2017   Procedure: CATARACT EXTRACTION PHACO AND INTRAOCULAR LENS PLACEMENT (IOC) LEFT SYMFONY LENS;  Surgeon: Mittie Gaskin, MD;  Location: [facility];  Service: Ophthalmology;  Laterality: Left;   COLONOSCOPY     ESOPHAGOGASTRODUODENOSCOPY      ESOPHAGOGASTRODUODENOSCOPY (EGD) WITH PROPOFOL  N/A 06/11/2016   Procedure: ESOPHAGOGASTRODUODENOSCOPY (EGD) WITH PROPOFOL ;  Surgeon: Viktoria Lamar DASEN, MD;  Location: [facility];  Service: Endoscopy;  Laterality: N/A;   ESOPHAGOGASTRODUODENOSCOPY (EGD) WITH PROPOFOL  N/A 05/25/2019   Procedure: ESOPHAGOGASTRODUODENOSCOPY (EGD) WITH BIOPSY;  Surgeon: Jinny Carmine, MD;  Location: [facility];  Service: Endoscopy;  Laterality: N/A;  Priority 3   JOINT REPLACEMENT Left    knee   PTOSIS REPAIR Bilateral 06/01/2014   Procedure: PTOSIS REPAIR;  Surgeon: Greig CHRISTELLA Gay, MD;  Location: [facility];  Service: Ophthalmology;  Laterality: Bilateral;   SKIN CANCER EXCISION     TOTAL KNEE ARTHROPLASTY Left 04/30/2012   Procedure: TOTAL KNEE ARTHROPLASTY;  Surgeon: Dempsey LULLA Moan, MD;  Location: [facility];  Service: Orthopedics;  Laterality: Left;   TUBAL LIGATION     Family History  Problem Relation Age of Onset   Hypertension Mother    Congestive Heart Failure Mother    Stroke Father    Stroke Brother    Heart failure Brother    Sarcoidosis Sister        died of kidney failure   Breast cancer Neg Hx    Colon cancer Neg Hx    Social History   Socioeconomic History   Marital status: Widowed    Spouse name: Not on file   Number of children: 2   Years of education: Not on file   Highest education  level: Bachelor's degree (e.g., BA, AB, BS)  Occupational History    Comment: Retired insurance account manager  Tobacco Use   Smoking status: Never   Smokeless tobacco: Never  Vaping Use   Vaping status: Never Used  Substance and Sexual Activity   Alcohol use: Yes    Alcohol/week: 1.0 standard drink of alcohol    Types: 1 Glasses of wine per week    Comment: SOCIALLY   Drug use: No   Sexual activity: Not Currently  Other Topics Concern   Not on file  Social History Narrative   Patient is a widow and she is a buyer, retail of [employer]   Retired microbiologist in the  [employer]   1 son and 1 daughter, 1 son is a forensic scientist working for [employer] and the daughter lives in [place]   Rare alcohol non-smoker   Social Drivers of Health   Tobacco Use: Low Risk (02/07/2024)   Patient History    Smoking Tobacco Use: Never    Smokeless Tobacco Use: Never    Passive Exposure: Not on file  Financial Resource Strain: Low Risk (09/22/2023)   Overall Financial Resource Strain (CARDIA)    Difficulty of Paying Living Expenses: Not hard at all  Food Insecurity: No Food Insecurity (09/22/2023)   Epic    Worried About Radiation Protection Practitioner of Food in the Last Year: Never true    Ran Out of Food in the Last Year: Never true  Transportation Needs: No Transportation Needs (09/22/2023)   Epic    Lack of Transportation (Medical): No    Lack of Transportation (Non-Medical): No  Physical Activity: Sufficiently Active (09/22/2023)   Exercise Vital Sign    Days of Exercise per Week: 5 days    Minutes of Exercise per Session: 30 min  Stress: No Stress Concern Present (09/22/2023)   [employer] of Occupational Health - Occupational Stress Questionnaire    Feeling of Stress: Not at all  Social Connections: Moderately Integrated (09/22/2023)   Social Connection and Isolation Panel    Frequency of Communication with Friends and Family: More than three times a week    Frequency of Social Gatherings with Friends and Family: More than three times a week    Attends Religious Services: More than 4 times per year    Active Member of [employer] or Organizations: Yes    Attends Banker Meetings: More than 4 times per year    Marital Status: Widowed  Depression (PHQ2-9): Low Risk (05/06/2023)   Depression (PHQ2-9)    PHQ-2 Score: 0  Alcohol Screen: Low Risk (09/22/2023)   Alcohol Screen    Last Alcohol Screening Score (AUDIT): 1  Housing: Low Risk (09/22/2023)   Epic    Unable to Pay for Housing in the Last Year: No    Number of Times Moved in the Last Year: 0     Homeless in the Last Year: No  Utilities: Not At Risk (03/13/2023)   AHC Utilities    Threatened with loss of utilities: No  Health Literacy: Adequate Health Literacy (03/13/2023)   B1300 Health Literacy    Frequency of need for help with medical instructions: Never     Review of Systems  Constitutional:  Negative for appetite change and unexpected weight change.  HENT:  Negative for congestion and sinus pressure.   Respiratory:  Negative for cough, chest tightness and shortness of breath.   Cardiovascular:  Negative for chest pain, palpitations and leg swelling.  Gastrointestinal:  Negative for abdominal pain, diarrhea, nausea and vomiting.  Genitourinary:  Negative for difficulty urinating and dysuria.  Musculoskeletal:  Negative for joint swelling and myalgias.  Skin:  Negative for color change and rash.  Neurological:  Negative for dizziness and headaches.  Psychiatric/Behavioral:  Negative for agitation and dysphoric mood.        Objective:     BP 130/80   Pulse 84   Temp 98.2 F (36.8 C) (Oral)   Ht 5' 3 (1.6 m)   Wt 181 lb (82.1 kg)   LMP 01/29/1993   SpO2 98%   BMI 32.06 kg/m  Wt Readings from Last 3 Encounters:  02/07/24 181 lb (82.1 kg)  09/24/23 175 lb 3.2 oz (79.5 kg)  05/20/23 174 lb 9.6 oz (79.2 kg)    Physical Exam Vitals reviewed.  Constitutional:      General: She is not in acute distress.    Appearance: Normal appearance.  HENT:     Head: Normocephalic and atraumatic.     Right Ear: External ear normal.     Left Ear: External ear normal.     Mouth/Throat:     Pharynx: No oropharyngeal exudate or posterior oropharyngeal erythema.  Eyes:     General: No scleral icterus.       Right eye: No discharge.        Left eye: No discharge.     Conjunctiva/sclera: Conjunctivae normal.  Neck:     Thyroid : No thyromegaly.  Cardiovascular:     Rate and Rhythm: Normal rate and regular rhythm.  Pulmonary:     Effort: No respiratory distress.     Breath  sounds: Normal breath sounds. No wheezing.  Abdominal:     General: Bowel sounds are normal.     Palpations: Abdomen is soft.     Tenderness: There is no abdominal tenderness.  Musculoskeletal:        General: No swelling or tenderness.     Cervical back: Neck supple. No tenderness.  Lymphadenopathy:     Cervical: No cervical adenopathy.  Skin:    Findings: No erythema or rash.  Neurological:     Mental Status: She is alert.  Psychiatric:        Mood and Affect: Mood normal.        Behavior: Behavior normal.         Outpatient Encounter Medications as of 02/07/2024  Medication Sig   albuterol  (VENTOLIN  HFA) 108 (90 Base) MCG/ACT inhaler Inhale 2 puffs into the lungs every 6 (six) hours as needed for wheezing or shortness of breath.   Cholecalciferol (VITAMIN D3 PO) Take by mouth daily.   ezetimibe -simvastatin  (VYTORIN ) 10-20 MG tablet Take 1 tablet by mouth daily.   Multiple Vitamin (MULTIVITAMIN) capsule Take 1 capsule by mouth daily. AM   pantoprazole  (PROTONIX ) 40 MG tablet TAKE ONE (1) TABLET BY MOUTH TWO TIMES PER DAY BEFORE A MEAL   Probiotic Product (PROBIOTIC DAILY PO) Take by mouth daily.   vitamin B-12 (CYANOCOBALAMIN) 1000 MCG tablet Take 1,000 mcg by mouth daily.   [DISCONTINUED] ezetimibe -simvastatin  (VYTORIN ) 10-40 MG tablet Take 1 tablet by mouth daily.   telmisartan  (MICARDIS ) 40 MG tablet Take 1 tablet (40 mg total) by mouth every morning.   [DISCONTINUED] telmisartan  (MICARDIS ) 40 MG tablet Take 1 tablet (40 mg total) by mouth every morning.   [DISCONTINUED] Vitamin E 400 units TABS Take by mouth daily. (Patient not taking: Reported on 02/07/2024)   No facility-administered encounter medications on file as of  02/07/2024.     Lab Results  Component Value Date   WBC 4.6 09/16/2023   HGB 14.0 09/16/2023   HCT 41.7 09/16/2023   PLT 202.0 09/16/2023   GLUCOSE 109 (H) 02/05/2024   CHOL 283 (H) 02/05/2024   TRIG 149.0 02/05/2024   HDL 56.90 02/05/2024    LDLDIRECT 182.4 02/05/2012   LDLCALC 196 (H) 02/05/2024   ALT 19 02/05/2024   AST 23 02/05/2024   NA 140 02/05/2024   K 3.9 02/05/2024   CL 105 02/05/2024   CREATININE 0.95 02/05/2024   BUN 25 (H) 02/05/2024   CO2 26 02/05/2024   TSH 6.65 (H) 02/05/2024   INR 0.99 04/22/2012   HGBA1C 6.1 02/05/2024   MICROALBUR <0.7 02/05/2024    MM 3D SCREENING MAMMOGRAM BILATERAL BREAST Result Date: 12/11/2022 CLINICAL DATA:  Screening. EXAM: DIGITAL SCREENING BILATERAL MAMMOGRAM WITH TOMOSYNTHESIS AND CAD TECHNIQUE: Bilateral screening digital craniocaudal and mediolateral oblique mammograms were obtained. Bilateral screening digital breast tomosynthesis was performed. The images were evaluated with computer-aided detection. COMPARISON:  Previous exam(s). ACR Breast Density Category b: There are scattered areas of fibroglandular density. FINDINGS: There are no findings suspicious for malignancy. IMPRESSION: No mammographic evidence of malignancy. A result letter of this screening mammogram will be mailed directly to the patient. RECOMMENDATION: Screening mammogram in one year. (Code:SM-B-01Y) BI-RADS CATEGORY  1: Negative. Electronically Signed   By: Alm Parkins M.D.   On: 12/11/2022 09:49       Assessment & Plan:  Routine general medical examination at a health care facility  Type 2 diabetes mellitus with hyperglycemia, without long-term current use of insulin (HCC) Assessment & Plan: Low carb diet and exercise. Discussed A1c. Discussed recommendation for statin. Continue diet and exercise. Follow met b and A1c.  Lab Results  Component Value Date   HGBA1C 6.1 02/05/2024     Orders: -     Hemoglobin A1c; Future  Hypercholesterolemia Assessment & Plan: Low cholesterol diet and exercise. Discussed recent labs. LDL elevated. Discussed recommendation for statin therapy.  Off now. Agreeable to restart vytorin  - 20/10 q day. Follow.  Lab Results  Component Value Date   CHOL 283 (H) 02/05/2024    HDL 56.90 02/05/2024   LDLCALC 196 (H) 02/05/2024   LDLDIRECT 182.4 02/05/2012   TRIG 149.0 02/05/2024   CHOLHDL 5 02/05/2024     Orders: -     Basic metabolic panel with GFR; Future -     Lipid panel; Future -     Hepatic function panel; Future  Health care maintenance Assessment & Plan: Physical today 02/07/24. SABRA  PAP 10/05/20 - negative with negative HPV.  Mammogram 12/07/22 - Birads I. Overdue mammogram.  Scheduled for 03/05/24. Colonoscopy 10/2013.  Recommended f/u in 10 years.    Need for influenza vaccination -     Flu vaccine HIGH DOSE PF(Fluzone Trivalent)  Primary hypertension Assessment & Plan: Continue micardis .  Blood pressure doing well.  Follow pressures. GFR 56. Follow metabolic panel. No change in medication today.    Hiatal hernia with GERD Assessment & Plan: Doing well on protonix .    Gastroesophageal reflux disease with esophagitis without hemorrhage Assessment & Plan: Doing well on protonix .    Stage 3a chronic kidney disease (HCC) Assessment & Plan: Continue micardis . Stay hydrated. Avoid antiinflammatory medication. Follow metabolic panel.    Barrett's esophagus without dysplasia Assessment & Plan: Has seen GI. Continue protonix . No upper symptoms reported.    Other orders -     Telmisartan ; Take  1 tablet (40 mg total) by mouth every morning.  Dispense: 90 tablet; Refill: 1 -     Ezetimibe -Simvastatin ; Take 1 tablet by mouth daily.  Dispense: 90 tablet; Refill: 1     Allena Hamilton, MD "

## 2024-02-15 NOTE — Assessment & Plan Note (Signed)
Doing well on protonix. 

## 2024-02-15 NOTE — Assessment & Plan Note (Signed)
 Low cholesterol diet and exercise. Discussed recent labs. LDL elevated. Discussed recommendation for statin therapy.  Off now. Agreeable to restart vytorin  - 20/10 q day. Follow.  Lab Results  Component Value Date   CHOL 283 (H) 02/05/2024   HDL 56.90 02/05/2024   LDLCALC 196 (H) 02/05/2024   LDLDIRECT 182.4 02/05/2012   TRIG 149.0 02/05/2024   CHOLHDL 5 02/05/2024

## 2024-02-15 NOTE — Assessment & Plan Note (Signed)
 Low carb diet and exercise. Discussed A1c. Discussed recommendation for statin. Continue diet and exercise. Follow met b and A1c.  Lab Results  Component Value Date   HGBA1C 6.1 02/05/2024

## 2024-02-15 NOTE — Assessment & Plan Note (Signed)
 Continue micardis . Stay hydrated. Avoid antiinflammatory medication. Follow metabolic panel.

## 2024-02-15 NOTE — Assessment & Plan Note (Signed)
 Continue micardis .  Blood pressure doing well.  Follow pressures. GFR 56. Follow metabolic panel. No change in medication today.

## 2024-02-15 NOTE — Assessment & Plan Note (Signed)
 Has seen GI. Continue protonix . No upper symptoms reported.

## 2024-03-05 ENCOUNTER — Other Ambulatory Visit: Payer: Self-pay

## 2024-03-05 ENCOUNTER — Encounter

## 2024-03-05 DIAGNOSIS — E7800 Pure hypercholesterolemia, unspecified: Secondary | ICD-10-CM

## 2024-03-06 ENCOUNTER — Other Ambulatory Visit

## 2024-03-06 DIAGNOSIS — E7800 Pure hypercholesterolemia, unspecified: Secondary | ICD-10-CM

## 2024-03-06 LAB — HEPATIC FUNCTION PANEL
ALT: 38 U/L — ABNORMAL HIGH (ref 3–35)
AST: 37 U/L (ref 5–37)
Albumin: 4.3 g/dL (ref 3.5–5.2)
Alkaline Phosphatase: 75 U/L (ref 39–117)
Bilirubin, Direct: 0.1 mg/dL (ref 0.1–0.3)
Total Bilirubin: 0.7 mg/dL (ref 0.2–1.2)
Total Protein: 6.5 g/dL (ref 6.0–8.3)

## 2024-03-06 LAB — TSH: TSH: 5.55 u[IU]/mL — ABNORMAL HIGH (ref 0.35–5.50)

## 2024-03-06 LAB — T4, FREE: Free T4: 0.68 ng/dL (ref 0.60–1.60)

## 2024-03-16 ENCOUNTER — Ambulatory Visit: Payer: Medicare PPO

## 2024-06-10 ENCOUNTER — Other Ambulatory Visit

## 2024-06-12 ENCOUNTER — Ambulatory Visit: Admitting: Internal Medicine
